# Patient Record
Sex: Male | Born: 1935
Health system: Southern US, Community
[De-identification: ages and names within clinical notes are randomized; demographics above are authoritative.]

## PROBLEM LIST (undated history)

## (undated) DIAGNOSIS — Z860100 Personal history of colon polyps, unspecified: Secondary | ICD-10-CM

## (undated) DIAGNOSIS — D509 Iron deficiency anemia, unspecified: Secondary | ICD-10-CM

## (undated) DIAGNOSIS — R0609 Other forms of dyspnea: Secondary | ICD-10-CM

## (undated) DIAGNOSIS — K219 Gastro-esophageal reflux disease without esophagitis: Secondary | ICD-10-CM

## (undated) DIAGNOSIS — Z8673 Personal history of transient ischemic attack (TIA), and cerebral infarction without residual deficits: Secondary | ICD-10-CM

## (undated) DIAGNOSIS — E119 Type 2 diabetes mellitus without complications: Secondary | ICD-10-CM

## (undated) DIAGNOSIS — R0600 Dyspnea, unspecified: Secondary | ICD-10-CM

## (undated) DIAGNOSIS — K08109 Complete loss of teeth, unspecified cause, unspecified class: Secondary | ICD-10-CM

## (undated) DIAGNOSIS — N401 Enlarged prostate with lower urinary tract symptoms: Secondary | ICD-10-CM

## (undated) DIAGNOSIS — I10 Essential (primary) hypertension: Secondary | ICD-10-CM

## (undated) DIAGNOSIS — M1990 Unspecified osteoarthritis, unspecified site: Secondary | ICD-10-CM

## (undated) DIAGNOSIS — R269 Unspecified abnormalities of gait and mobility: Secondary | ICD-10-CM

## (undated) DIAGNOSIS — E039 Hypothyroidism, unspecified: Secondary | ICD-10-CM

## (undated) DIAGNOSIS — Z972 Presence of dental prosthetic device (complete) (partial): Secondary | ICD-10-CM

## (undated) DIAGNOSIS — E785 Hyperlipidemia, unspecified: Secondary | ICD-10-CM

## (undated) DIAGNOSIS — R011 Cardiac murmur, unspecified: Secondary | ICD-10-CM

## (undated) DIAGNOSIS — R413 Other amnesia: Secondary | ICD-10-CM

## (undated) HISTORY — DX: Personal history of colon polyps, unspecified: Z86.0100

## (undated) HISTORY — DX: Hyperlipidemia, unspecified: E78.5

## (undated) HISTORY — DX: Personal history of colonic polyps: Z86.010

## (undated) HISTORY — DX: Cardiac murmur, unspecified: R01.1

## (undated) HISTORY — DX: Gastro-esophageal reflux disease without esophagitis: K21.9

## (undated) HISTORY — DX: Essential (primary) hypertension: I10

## (undated) HISTORY — PX: LAPAROSCOPIC CHOLECYSTECTOMY: SUR755

## (undated) HISTORY — PX: HERNIA REPAIR: SHX51

---

## 1999-11-21 ENCOUNTER — Other Ambulatory Visit: Admission: RE | Admit: 1999-11-21 | Discharge: 1999-11-21 | Payer: Self-pay | Admitting: Gastroenterology

## 1999-11-21 ENCOUNTER — Encounter (INDEPENDENT_AMBULATORY_CARE_PROVIDER_SITE_OTHER): Payer: Self-pay

## 2000-04-30 ENCOUNTER — Encounter (INDEPENDENT_AMBULATORY_CARE_PROVIDER_SITE_OTHER): Payer: Self-pay | Admitting: Specialist

## 2000-04-30 ENCOUNTER — Other Ambulatory Visit: Admission: RE | Admit: 2000-04-30 | Discharge: 2000-04-30 | Payer: Self-pay | Admitting: Gastroenterology

## 2000-09-02 ENCOUNTER — Encounter: Payer: Self-pay | Admitting: Internal Medicine

## 2000-09-02 ENCOUNTER — Ambulatory Visit (HOSPITAL_COMMUNITY): Admission: RE | Admit: 2000-09-02 | Discharge: 2000-09-02 | Payer: Self-pay | Admitting: Internal Medicine

## 2001-01-23 ENCOUNTER — Encounter: Payer: Self-pay | Admitting: Emergency Medicine

## 2001-01-23 ENCOUNTER — Inpatient Hospital Stay (HOSPITAL_COMMUNITY): Admission: EM | Admit: 2001-01-23 | Discharge: 2001-01-25 | Payer: Self-pay | Admitting: Emergency Medicine

## 2001-09-05 ENCOUNTER — Other Ambulatory Visit: Admission: RE | Admit: 2001-09-05 | Discharge: 2001-09-05 | Payer: Self-pay | Admitting: Gastroenterology

## 2002-01-10 ENCOUNTER — Encounter (INDEPENDENT_AMBULATORY_CARE_PROVIDER_SITE_OTHER): Payer: Self-pay | Admitting: Specialist

## 2002-01-11 ENCOUNTER — Inpatient Hospital Stay (HOSPITAL_COMMUNITY): Admission: EM | Admit: 2002-01-11 | Discharge: 2002-01-16 | Payer: Self-pay | Admitting: Emergency Medicine

## 2002-01-11 ENCOUNTER — Encounter: Payer: Self-pay | Admitting: Emergency Medicine

## 2002-01-14 ENCOUNTER — Encounter: Payer: Self-pay | Admitting: General Surgery

## 2002-11-26 ENCOUNTER — Ambulatory Visit (HOSPITAL_COMMUNITY): Admission: RE | Admit: 2002-11-26 | Discharge: 2002-11-26 | Payer: Self-pay | Admitting: Internal Medicine

## 2004-05-08 ENCOUNTER — Encounter: Admission: RE | Admit: 2004-05-08 | Discharge: 2004-08-06 | Payer: Self-pay | Admitting: Internal Medicine

## 2004-08-17 ENCOUNTER — Encounter: Admission: RE | Admit: 2004-08-17 | Discharge: 2004-08-17 | Payer: Self-pay | Admitting: Internal Medicine

## 2004-11-06 ENCOUNTER — Ambulatory Visit: Payer: Self-pay | Admitting: Internal Medicine

## 2004-11-21 ENCOUNTER — Ambulatory Visit: Payer: Self-pay | Admitting: Internal Medicine

## 2005-01-02 ENCOUNTER — Ambulatory Visit: Payer: Self-pay | Admitting: Internal Medicine

## 2005-01-04 ENCOUNTER — Ambulatory Visit: Payer: Self-pay | Admitting: Internal Medicine

## 2005-04-05 ENCOUNTER — Ambulatory Visit: Payer: Self-pay | Admitting: Internal Medicine

## 2005-04-09 ENCOUNTER — Ambulatory Visit: Payer: Self-pay | Admitting: Gastroenterology

## 2005-04-09 ENCOUNTER — Ambulatory Visit: Payer: Self-pay | Admitting: Internal Medicine

## 2005-05-29 ENCOUNTER — Ambulatory Visit: Payer: Self-pay | Admitting: Gastroenterology

## 2005-06-04 ENCOUNTER — Ambulatory Visit: Payer: Self-pay | Admitting: Internal Medicine

## 2005-06-15 ENCOUNTER — Ambulatory Visit: Payer: Self-pay | Admitting: Internal Medicine

## 2005-06-28 ENCOUNTER — Ambulatory Visit: Payer: Self-pay | Admitting: Internal Medicine

## 2005-08-20 ENCOUNTER — Ambulatory Visit: Payer: Self-pay | Admitting: Internal Medicine

## 2005-10-09 ENCOUNTER — Ambulatory Visit: Payer: Self-pay | Admitting: Internal Medicine

## 2005-10-15 ENCOUNTER — Ambulatory Visit: Payer: Self-pay | Admitting: Internal Medicine

## 2006-01-11 ENCOUNTER — Ambulatory Visit: Payer: Self-pay | Admitting: Internal Medicine

## 2006-05-21 ENCOUNTER — Ambulatory Visit: Payer: Self-pay | Admitting: Internal Medicine

## 2006-05-30 ENCOUNTER — Ambulatory Visit: Payer: Self-pay | Admitting: Family Medicine

## 2006-06-13 ENCOUNTER — Ambulatory Visit: Payer: Self-pay | Admitting: Family Medicine

## 2006-06-20 ENCOUNTER — Ambulatory Visit: Payer: Self-pay | Admitting: Family Medicine

## 2006-08-01 ENCOUNTER — Encounter: Admission: RE | Admit: 2006-08-01 | Discharge: 2006-10-30 | Payer: Self-pay | Admitting: Internal Medicine

## 2006-09-16 ENCOUNTER — Ambulatory Visit: Payer: Self-pay | Admitting: Family Medicine

## 2006-09-30 ENCOUNTER — Ambulatory Visit: Payer: Self-pay | Admitting: Family Medicine

## 2006-10-15 ENCOUNTER — Encounter: Admission: RE | Admit: 2006-10-15 | Discharge: 2007-01-13 | Payer: Self-pay | Admitting: Internal Medicine

## 2006-11-11 ENCOUNTER — Ambulatory Visit: Payer: Self-pay | Admitting: Family Medicine

## 2006-11-25 ENCOUNTER — Ambulatory Visit: Payer: Self-pay | Admitting: Family Medicine

## 2007-01-28 ENCOUNTER — Ambulatory Visit: Payer: Self-pay | Admitting: Family Medicine

## 2007-02-10 DIAGNOSIS — E118 Type 2 diabetes mellitus with unspecified complications: Secondary | ICD-10-CM | POA: Insufficient documentation

## 2007-02-10 DIAGNOSIS — I10 Essential (primary) hypertension: Secondary | ICD-10-CM

## 2007-02-10 DIAGNOSIS — E785 Hyperlipidemia, unspecified: Secondary | ICD-10-CM

## 2007-02-10 HISTORY — DX: Essential (primary) hypertension: I10

## 2007-02-10 HISTORY — DX: Hyperlipidemia, unspecified: E78.5

## 2007-02-10 HISTORY — DX: Type 2 diabetes mellitus with unspecified complications: E11.8

## 2007-04-22 ENCOUNTER — Ambulatory Visit: Payer: Self-pay | Admitting: Family Medicine

## 2007-04-25 ENCOUNTER — Ambulatory Visit: Payer: Self-pay | Admitting: Family Medicine

## 2007-04-25 LAB — CONVERTED CEMR LAB
ALT: 18 units/L (ref 0–40)
AST: 19 units/L (ref 0–37)
BUN: 10 mg/dL (ref 6–23)
Bilirubin, Direct: 0.1 mg/dL (ref 0.0–0.3)
CO2: 31 meq/L (ref 19–32)
Calcium: 8.7 mg/dL (ref 8.4–10.5)
Chloride: 104 meq/L (ref 96–112)
GFR calc Af Amer: 106 mL/min
Hgb A1c MFr Bld: 7.1 % — ABNORMAL HIGH (ref 4.6–6.0)
Potassium: 3.7 meq/L (ref 3.5–5.1)
Total Bilirubin: 1 mg/dL (ref 0.3–1.2)
Total Protein: 6.1 g/dL (ref 6.0–8.3)

## 2007-05-05 ENCOUNTER — Ambulatory Visit: Payer: Self-pay | Admitting: Family Medicine

## 2007-05-15 ENCOUNTER — Ambulatory Visit: Payer: Self-pay | Admitting: Internal Medicine

## 2007-05-22 ENCOUNTER — Ambulatory Visit: Payer: Self-pay | Admitting: Family Medicine

## 2007-05-22 ENCOUNTER — Encounter: Payer: Self-pay | Admitting: Family Medicine

## 2007-05-22 DIAGNOSIS — H6120 Impacted cerumen, unspecified ear: Secondary | ICD-10-CM

## 2007-06-21 ENCOUNTER — Encounter: Payer: Self-pay | Admitting: Family Medicine

## 2007-06-26 ENCOUNTER — Ambulatory Visit: Payer: Self-pay | Admitting: Internal Medicine

## 2007-06-26 DIAGNOSIS — S43499A Other sprain of unspecified shoulder joint, initial encounter: Secondary | ICD-10-CM

## 2007-06-26 DIAGNOSIS — S46819A Strain of other muscles, fascia and tendons at shoulder and upper arm level, unspecified arm, initial encounter: Secondary | ICD-10-CM

## 2007-06-26 HISTORY — DX: Other sprain of unspecified shoulder joint, initial encounter: S43.499A

## 2007-07-28 ENCOUNTER — Ambulatory Visit: Payer: Self-pay | Admitting: Family Medicine

## 2007-07-28 ENCOUNTER — Encounter: Payer: Self-pay | Admitting: Family Medicine

## 2007-07-28 DIAGNOSIS — G5600 Carpal tunnel syndrome, unspecified upper limb: Secondary | ICD-10-CM

## 2007-07-28 HISTORY — DX: Carpal tunnel syndrome, unspecified upper limb: G56.00

## 2007-07-29 ENCOUNTER — Encounter (INDEPENDENT_AMBULATORY_CARE_PROVIDER_SITE_OTHER): Payer: Self-pay | Admitting: *Deleted

## 2007-07-29 LAB — CONVERTED CEMR LAB
Albumin: 3.8 g/dL (ref 3.5–5.2)
BUN: 12 mg/dL (ref 6–23)
Chloride: 101 meq/L (ref 96–112)
GFR calc Af Amer: 106 mL/min
Hgb A1c MFr Bld: 6.4 % — ABNORMAL HIGH (ref 4.6–6.0)
Sodium: 141 meq/L (ref 135–145)

## 2007-08-11 ENCOUNTER — Ambulatory Visit: Payer: Self-pay | Admitting: Family Medicine

## 2007-08-11 DIAGNOSIS — M5412 Radiculopathy, cervical region: Secondary | ICD-10-CM

## 2007-08-11 HISTORY — DX: Radiculopathy, cervical region: M54.12

## 2007-08-21 ENCOUNTER — Encounter: Payer: Self-pay | Admitting: Internal Medicine

## 2007-08-21 DIAGNOSIS — E039 Hypothyroidism, unspecified: Secondary | ICD-10-CM

## 2007-08-21 DIAGNOSIS — I2510 Atherosclerotic heart disease of native coronary artery without angina pectoris: Secondary | ICD-10-CM

## 2007-08-21 DIAGNOSIS — Z860100 Personal history of colon polyps, unspecified: Secondary | ICD-10-CM | POA: Insufficient documentation

## 2007-08-21 DIAGNOSIS — R05 Cough: Secondary | ICD-10-CM

## 2007-08-21 DIAGNOSIS — N529 Male erectile dysfunction, unspecified: Secondary | ICD-10-CM | POA: Insufficient documentation

## 2007-08-21 DIAGNOSIS — K219 Gastro-esophageal reflux disease without esophagitis: Secondary | ICD-10-CM

## 2007-08-21 DIAGNOSIS — E669 Obesity, unspecified: Secondary | ICD-10-CM | POA: Insufficient documentation

## 2007-08-21 DIAGNOSIS — I517 Cardiomegaly: Secondary | ICD-10-CM

## 2007-08-21 HISTORY — DX: Obesity, unspecified: E66.9

## 2007-08-21 HISTORY — DX: Cardiomegaly: I51.7

## 2007-08-21 HISTORY — DX: Male erectile dysfunction, unspecified: N52.9

## 2007-08-21 HISTORY — DX: Gastro-esophageal reflux disease without esophagitis: K21.9

## 2007-08-21 HISTORY — DX: Atherosclerotic heart disease of native coronary artery without angina pectoris: I25.10

## 2007-09-22 ENCOUNTER — Telehealth (INDEPENDENT_AMBULATORY_CARE_PROVIDER_SITE_OTHER): Payer: Self-pay | Admitting: *Deleted

## 2007-09-23 ENCOUNTER — Telehealth (INDEPENDENT_AMBULATORY_CARE_PROVIDER_SITE_OTHER): Payer: Self-pay | Admitting: *Deleted

## 2007-10-07 ENCOUNTER — Encounter: Payer: Self-pay | Admitting: Family Medicine

## 2007-10-09 ENCOUNTER — Telehealth (INDEPENDENT_AMBULATORY_CARE_PROVIDER_SITE_OTHER): Payer: Self-pay | Admitting: *Deleted

## 2007-10-30 ENCOUNTER — Encounter: Payer: Self-pay | Admitting: Family Medicine

## 2007-11-10 ENCOUNTER — Ambulatory Visit: Payer: Self-pay | Admitting: Family Medicine

## 2007-11-10 DIAGNOSIS — M25559 Pain in unspecified hip: Secondary | ICD-10-CM | POA: Insufficient documentation

## 2007-11-10 HISTORY — DX: Pain in unspecified hip: M25.559

## 2007-11-11 ENCOUNTER — Ambulatory Visit: Payer: Self-pay | Admitting: Family Medicine

## 2007-11-11 LAB — CONVERTED CEMR LAB
Alkaline Phosphatase: 38 units/L — ABNORMAL LOW (ref 39–117)
Total Protein: 6.7 g/dL (ref 6.0–8.3)

## 2007-11-13 ENCOUNTER — Telehealth (INDEPENDENT_AMBULATORY_CARE_PROVIDER_SITE_OTHER): Payer: Self-pay | Admitting: *Deleted

## 2007-11-25 ENCOUNTER — Ambulatory Visit: Payer: Self-pay | Admitting: Family Medicine

## 2007-11-25 DIAGNOSIS — Z87898 Personal history of other specified conditions: Secondary | ICD-10-CM

## 2007-11-25 DIAGNOSIS — L299 Pruritus, unspecified: Secondary | ICD-10-CM

## 2007-11-25 DIAGNOSIS — N39498 Other specified urinary incontinence: Secondary | ICD-10-CM | POA: Insufficient documentation

## 2007-11-25 HISTORY — DX: Other specified urinary incontinence: N39.498

## 2007-11-25 HISTORY — DX: Personal history of other specified conditions: Z87.898

## 2007-11-25 HISTORY — DX: Pruritus, unspecified: L29.9

## 2007-11-25 LAB — CONVERTED CEMR LAB
Bilirubin Urine: NEGATIVE
Glucose, Urine, Semiquant: NEGATIVE
Specific Gravity, Urine: 1.025
pH: 6

## 2007-12-01 ENCOUNTER — Encounter (INDEPENDENT_AMBULATORY_CARE_PROVIDER_SITE_OTHER): Payer: Self-pay | Admitting: *Deleted

## 2007-12-01 LAB — CONVERTED CEMR LAB
ALT: 21 units/L (ref 0–53)
AST: 17 units/L (ref 0–37)
Albumin: 3.9 g/dL (ref 3.5–5.2)
Calcium: 9.5 mg/dL (ref 8.4–10.5)
Chloride: 95 meq/L — ABNORMAL LOW (ref 96–112)
GFR calc Af Amer: 106 mL/min
GFR calc non Af Amer: 87 mL/min
PSA: 0.58 ng/mL (ref 0.10–4.00)

## 2007-12-08 ENCOUNTER — Ambulatory Visit: Payer: Self-pay | Admitting: Internal Medicine

## 2007-12-08 DIAGNOSIS — J069 Acute upper respiratory infection, unspecified: Secondary | ICD-10-CM | POA: Insufficient documentation

## 2008-01-05 ENCOUNTER — Ambulatory Visit: Payer: Self-pay | Admitting: Family Medicine

## 2008-01-10 LAB — CONVERTED CEMR LAB: TSH: 6.27 microintl units/mL — ABNORMAL HIGH (ref 0.35–5.50)

## 2008-01-12 ENCOUNTER — Telehealth (INDEPENDENT_AMBULATORY_CARE_PROVIDER_SITE_OTHER): Payer: Self-pay | Admitting: *Deleted

## 2008-01-17 ENCOUNTER — Ambulatory Visit: Payer: Self-pay | Admitting: Family Medicine

## 2008-02-05 ENCOUNTER — Ambulatory Visit: Payer: Self-pay | Admitting: Family Medicine

## 2008-02-09 ENCOUNTER — Encounter (INDEPENDENT_AMBULATORY_CARE_PROVIDER_SITE_OTHER): Payer: Self-pay | Admitting: *Deleted

## 2008-02-10 ENCOUNTER — Encounter: Payer: Self-pay | Admitting: Family Medicine

## 2008-02-24 ENCOUNTER — Encounter: Admission: RE | Admit: 2008-02-24 | Discharge: 2008-02-24 | Payer: Self-pay | Admitting: Internal Medicine

## 2008-02-24 ENCOUNTER — Encounter: Payer: Self-pay | Admitting: Family Medicine

## 2008-04-26 ENCOUNTER — Telehealth (INDEPENDENT_AMBULATORY_CARE_PROVIDER_SITE_OTHER): Payer: Self-pay | Admitting: *Deleted

## 2008-05-04 ENCOUNTER — Ambulatory Visit: Payer: Self-pay | Admitting: Family Medicine

## 2008-05-09 LAB — CONVERTED CEMR LAB
BUN: 10 mg/dL (ref 6–23)
CO2: 33 meq/L — ABNORMAL HIGH (ref 19–32)
GFR calc Af Amer: 106 mL/min
Glucose, Bld: 104 mg/dL — ABNORMAL HIGH (ref 70–99)
Potassium: 4.4 meq/L (ref 3.5–5.1)

## 2008-05-10 ENCOUNTER — Encounter (INDEPENDENT_AMBULATORY_CARE_PROVIDER_SITE_OTHER): Payer: Self-pay | Admitting: *Deleted

## 2008-05-21 ENCOUNTER — Ambulatory Visit: Payer: Self-pay | Admitting: Family Medicine

## 2008-05-24 ENCOUNTER — Telehealth (INDEPENDENT_AMBULATORY_CARE_PROVIDER_SITE_OTHER): Payer: Self-pay | Admitting: *Deleted

## 2008-05-30 LAB — CONVERTED CEMR LAB
Albumin: 3.7 g/dL (ref 3.5–5.2)
Alkaline Phosphatase: 41 units/L (ref 39–117)
Cholesterol: 130 mg/dL (ref 0–200)
LDL Cholesterol: 85 mg/dL (ref 0–99)
Total CHOL/HDL Ratio: 4.6
Total Protein: 6.6 g/dL (ref 6.0–8.3)
Triglycerides: 86 mg/dL (ref 0–149)
VLDL: 17 mg/dL (ref 0–40)

## 2008-05-31 ENCOUNTER — Telehealth (INDEPENDENT_AMBULATORY_CARE_PROVIDER_SITE_OTHER): Payer: Self-pay | Admitting: *Deleted

## 2008-05-31 ENCOUNTER — Encounter (INDEPENDENT_AMBULATORY_CARE_PROVIDER_SITE_OTHER): Payer: Self-pay | Admitting: *Deleted

## 2008-06-04 ENCOUNTER — Telehealth (INDEPENDENT_AMBULATORY_CARE_PROVIDER_SITE_OTHER): Payer: Self-pay | Admitting: *Deleted

## 2008-06-15 ENCOUNTER — Telehealth (INDEPENDENT_AMBULATORY_CARE_PROVIDER_SITE_OTHER): Payer: Self-pay | Admitting: *Deleted

## 2008-06-15 ENCOUNTER — Ambulatory Visit: Payer: Self-pay | Admitting: Internal Medicine

## 2008-06-15 DIAGNOSIS — M542 Cervicalgia: Secondary | ICD-10-CM

## 2008-06-15 HISTORY — DX: Cervicalgia: M54.2

## 2008-06-18 ENCOUNTER — Ambulatory Visit: Payer: Self-pay | Admitting: Family Medicine

## 2008-07-06 ENCOUNTER — Telehealth (INDEPENDENT_AMBULATORY_CARE_PROVIDER_SITE_OTHER): Payer: Self-pay | Admitting: *Deleted

## 2008-07-12 ENCOUNTER — Telehealth (INDEPENDENT_AMBULATORY_CARE_PROVIDER_SITE_OTHER): Payer: Self-pay | Admitting: *Deleted

## 2008-07-13 ENCOUNTER — Ambulatory Visit: Payer: Self-pay | Admitting: Family Medicine

## 2008-07-13 DIAGNOSIS — B3749 Other urogenital candidiasis: Secondary | ICD-10-CM

## 2008-07-13 HISTORY — DX: Other urogenital candidiasis: B37.49

## 2008-07-13 LAB — CONVERTED CEMR LAB
Bilirubin Urine: NEGATIVE
Blood in Urine, dipstick: NEGATIVE
Glucose, Urine, Semiquant: NEGATIVE
Ketones, urine, test strip: NEGATIVE
Nitrite: NEGATIVE
Specific Gravity, Urine: 1.02
pH: 6

## 2008-07-22 ENCOUNTER — Telehealth (INDEPENDENT_AMBULATORY_CARE_PROVIDER_SITE_OTHER): Payer: Self-pay | Admitting: *Deleted

## 2008-07-23 ENCOUNTER — Telehealth (INDEPENDENT_AMBULATORY_CARE_PROVIDER_SITE_OTHER): Payer: Self-pay | Admitting: *Deleted

## 2008-07-26 ENCOUNTER — Ambulatory Visit: Payer: Self-pay | Admitting: Gastroenterology

## 2008-07-27 ENCOUNTER — Telehealth: Payer: Self-pay | Admitting: Gastroenterology

## 2008-07-30 ENCOUNTER — Encounter: Payer: Self-pay | Admitting: Family Medicine

## 2008-08-09 ENCOUNTER — Ambulatory Visit: Payer: Self-pay | Admitting: Gastroenterology

## 2008-08-09 LAB — HM COLONOSCOPY

## 2008-09-20 ENCOUNTER — Ambulatory Visit: Payer: Self-pay | Admitting: Family Medicine

## 2008-09-21 ENCOUNTER — Telehealth (INDEPENDENT_AMBULATORY_CARE_PROVIDER_SITE_OTHER): Payer: Self-pay | Admitting: *Deleted

## 2008-09-23 ENCOUNTER — Telehealth (INDEPENDENT_AMBULATORY_CARE_PROVIDER_SITE_OTHER): Payer: Self-pay | Admitting: *Deleted

## 2008-10-02 LAB — CONVERTED CEMR LAB
ALT: 19 units/L (ref 0–53)
AST: 18 units/L (ref 0–37)
CO2: 33 meq/L — ABNORMAL HIGH (ref 19–32)
Calcium: 9.1 mg/dL (ref 8.4–10.5)
Chloride: 108 meq/L (ref 96–112)
Cholesterol: 144 mg/dL (ref 0–200)
Creatinine, Ser: 0.9 mg/dL (ref 0.4–1.5)
Glucose, Bld: 110 mg/dL — ABNORMAL HIGH (ref 70–99)
HDL: 37.1 mg/dL — ABNORMAL LOW (ref >39.0)
Hgb A1c MFr Bld: 6.4 % — ABNORMAL HIGH (ref 4.6–6.0)
TSH: 3.38 microintl units/mL (ref 0.35–5.50)
Total Bilirubin: 1.1 mg/dL (ref 0.3–1.2)
Total CHOL/HDL Ratio: 3.9
Total Protein: 7 g/dL (ref 6.0–8.3)
Triglycerides: 93 mg/dL (ref 0–149)

## 2008-10-04 ENCOUNTER — Encounter (INDEPENDENT_AMBULATORY_CARE_PROVIDER_SITE_OTHER): Payer: Self-pay | Admitting: *Deleted

## 2008-10-25 ENCOUNTER — Telehealth (INDEPENDENT_AMBULATORY_CARE_PROVIDER_SITE_OTHER): Payer: Self-pay | Admitting: *Deleted

## 2008-11-15 ENCOUNTER — Telehealth (INDEPENDENT_AMBULATORY_CARE_PROVIDER_SITE_OTHER): Payer: Self-pay | Admitting: *Deleted

## 2008-12-04 ENCOUNTER — Telehealth: Payer: Self-pay | Admitting: Family Medicine

## 2008-12-04 ENCOUNTER — Ambulatory Visit: Payer: Self-pay | Admitting: Family Medicine

## 2008-12-30 ENCOUNTER — Ambulatory Visit: Payer: Self-pay | Admitting: Family Medicine

## 2008-12-30 ENCOUNTER — Encounter: Payer: Self-pay | Admitting: Internal Medicine

## 2009-01-13 LAB — CONVERTED CEMR LAB
ALT: 17 units/L (ref 0–53)
AST: 19 units/L (ref 0–37)
Albumin: 3.7 g/dL (ref 3.5–5.2)
BUN: 8 mg/dL (ref 6–23)
CO2: 31 meq/L (ref 19–32)
Calcium: 9.3 mg/dL (ref 8.4–10.5)
Chloride: 100 meq/L (ref 96–112)
Creatinine, Ser: 0.9 mg/dL (ref 0.4–1.5)
GFR calc non Af Amer: 87 mL/min
Hgb A1c MFr Bld: 6.4 % — ABNORMAL HIGH (ref 4.6–6.0)

## 2009-01-14 ENCOUNTER — Encounter (INDEPENDENT_AMBULATORY_CARE_PROVIDER_SITE_OTHER): Payer: Self-pay | Admitting: *Deleted

## 2009-02-14 ENCOUNTER — Telehealth (INDEPENDENT_AMBULATORY_CARE_PROVIDER_SITE_OTHER): Payer: Self-pay | Admitting: *Deleted

## 2009-02-18 ENCOUNTER — Ambulatory Visit: Payer: Self-pay | Admitting: Internal Medicine

## 2009-02-18 DIAGNOSIS — R51 Headache: Secondary | ICD-10-CM

## 2009-02-18 DIAGNOSIS — R519 Headache, unspecified: Secondary | ICD-10-CM | POA: Insufficient documentation

## 2009-02-18 HISTORY — DX: Headache: R51

## 2009-03-03 ENCOUNTER — Ambulatory Visit: Payer: Self-pay | Admitting: Family Medicine

## 2009-03-08 ENCOUNTER — Ambulatory Visit: Payer: Self-pay | Admitting: Family Medicine

## 2009-03-12 ENCOUNTER — Encounter: Payer: Self-pay | Admitting: Family Medicine

## 2009-03-12 ENCOUNTER — Ambulatory Visit: Payer: Self-pay | Admitting: Family Medicine

## 2009-03-12 DIAGNOSIS — J019 Acute sinusitis, unspecified: Secondary | ICD-10-CM

## 2009-03-24 ENCOUNTER — Encounter: Payer: Self-pay | Admitting: Family Medicine

## 2009-03-30 LAB — CONVERTED CEMR LAB
Alkaline Phosphatase: 41 units/L (ref 39–117)
Bilirubin, Direct: 0.1 mg/dL (ref 0.0–0.3)
Calcium: 8.9 mg/dL (ref 8.4–10.5)
Creatinine, Ser: 0.9 mg/dL (ref 0.4–1.5)
HDL: 37.1 mg/dL — ABNORMAL LOW (ref >39.00)
Hgb A1c MFr Bld: 6.4 % (ref 4.6–6.5)
LDL Cholesterol: 124 mg/dL — ABNORMAL HIGH (ref 0–99)
Sodium: 140 meq/L (ref 135–145)
Total Bilirubin: 1 mg/dL (ref 0.3–1.2)
Total CHOL/HDL Ratio: 5
Triglycerides: 100 mg/dL (ref 0.0–149.0)
VLDL: 20 mg/dL (ref 0.0–40.0)

## 2009-03-31 ENCOUNTER — Encounter (INDEPENDENT_AMBULATORY_CARE_PROVIDER_SITE_OTHER): Payer: Self-pay | Admitting: *Deleted

## 2009-04-01 ENCOUNTER — Telehealth (INDEPENDENT_AMBULATORY_CARE_PROVIDER_SITE_OTHER): Payer: Self-pay | Admitting: *Deleted

## 2009-04-04 ENCOUNTER — Telehealth (INDEPENDENT_AMBULATORY_CARE_PROVIDER_SITE_OTHER): Payer: Self-pay | Admitting: *Deleted

## 2009-04-05 ENCOUNTER — Telehealth: Payer: Self-pay | Admitting: Family Medicine

## 2009-04-19 ENCOUNTER — Encounter: Payer: Self-pay | Admitting: Family Medicine

## 2009-04-25 ENCOUNTER — Telehealth (INDEPENDENT_AMBULATORY_CARE_PROVIDER_SITE_OTHER): Payer: Self-pay | Admitting: *Deleted

## 2009-05-27 ENCOUNTER — Emergency Department (HOSPITAL_BASED_OUTPATIENT_CLINIC_OR_DEPARTMENT_OTHER): Admission: EM | Admit: 2009-05-27 | Discharge: 2009-05-27 | Payer: Self-pay | Admitting: Emergency Medicine

## 2009-05-31 ENCOUNTER — Ambulatory Visit: Payer: Self-pay | Admitting: Family Medicine

## 2009-05-31 DIAGNOSIS — S51809A Unspecified open wound of unspecified forearm, initial encounter: Secondary | ICD-10-CM | POA: Insufficient documentation

## 2009-06-02 ENCOUNTER — Telehealth (INDEPENDENT_AMBULATORY_CARE_PROVIDER_SITE_OTHER): Payer: Self-pay | Admitting: *Deleted

## 2009-06-20 ENCOUNTER — Telehealth (INDEPENDENT_AMBULATORY_CARE_PROVIDER_SITE_OTHER): Payer: Self-pay | Admitting: *Deleted

## 2009-07-08 ENCOUNTER — Telehealth (INDEPENDENT_AMBULATORY_CARE_PROVIDER_SITE_OTHER): Payer: Self-pay | Admitting: *Deleted

## 2009-07-11 ENCOUNTER — Ambulatory Visit: Payer: Self-pay | Admitting: Family Medicine

## 2009-07-25 ENCOUNTER — Encounter (INDEPENDENT_AMBULATORY_CARE_PROVIDER_SITE_OTHER): Payer: Self-pay | Admitting: *Deleted

## 2009-07-25 LAB — CONVERTED CEMR LAB
ALT: 13 units/L (ref 0–53)
Albumin: 3.9 g/dL (ref 3.5–5.2)
BUN: 11 mg/dL (ref 6–23)
Bilirubin, Direct: 0.1 mg/dL (ref 0.0–0.3)
CO2: 33 meq/L — ABNORMAL HIGH (ref 19–32)
Chloride: 102 meq/L (ref 96–112)
Creatinine, Ser: 0.8 mg/dL (ref 0.4–1.5)
Glucose, Bld: 106 mg/dL — ABNORMAL HIGH (ref 70–99)
Total CHOL/HDL Ratio: 3
Total Protein: 6.7 g/dL (ref 6.0–8.3)
Triglycerides: 77 mg/dL (ref 0.0–149.0)

## 2009-07-28 ENCOUNTER — Ambulatory Visit: Payer: Self-pay | Admitting: Family Medicine

## 2009-07-28 DIAGNOSIS — M549 Dorsalgia, unspecified: Secondary | ICD-10-CM

## 2009-07-28 HISTORY — DX: Dorsalgia, unspecified: M54.9

## 2009-09-01 ENCOUNTER — Ambulatory Visit: Payer: Self-pay | Admitting: Family Medicine

## 2009-09-05 ENCOUNTER — Telehealth (INDEPENDENT_AMBULATORY_CARE_PROVIDER_SITE_OTHER): Payer: Self-pay | Admitting: *Deleted

## 2009-09-27 ENCOUNTER — Telehealth (INDEPENDENT_AMBULATORY_CARE_PROVIDER_SITE_OTHER): Payer: Self-pay | Admitting: *Deleted

## 2009-09-29 ENCOUNTER — Telehealth (INDEPENDENT_AMBULATORY_CARE_PROVIDER_SITE_OTHER): Payer: Self-pay | Admitting: *Deleted

## 2009-10-06 ENCOUNTER — Telehealth (INDEPENDENT_AMBULATORY_CARE_PROVIDER_SITE_OTHER): Payer: Self-pay | Admitting: *Deleted

## 2009-10-10 ENCOUNTER — Ambulatory Visit: Payer: Self-pay | Admitting: Family Medicine

## 2009-10-13 ENCOUNTER — Telehealth (INDEPENDENT_AMBULATORY_CARE_PROVIDER_SITE_OTHER): Payer: Self-pay | Admitting: *Deleted

## 2009-10-18 ENCOUNTER — Ambulatory Visit: Payer: Self-pay | Admitting: Family Medicine

## 2009-11-01 ENCOUNTER — Telehealth: Payer: Self-pay | Admitting: Family Medicine

## 2009-12-22 ENCOUNTER — Telehealth: Payer: Self-pay | Admitting: Family Medicine

## 2010-01-10 ENCOUNTER — Telehealth (INDEPENDENT_AMBULATORY_CARE_PROVIDER_SITE_OTHER): Payer: Self-pay | Admitting: *Deleted

## 2010-01-12 ENCOUNTER — Emergency Department (HOSPITAL_COMMUNITY): Admission: EM | Admit: 2010-01-12 | Discharge: 2010-01-12 | Payer: Self-pay | Admitting: Emergency Medicine

## 2010-01-17 ENCOUNTER — Ambulatory Visit: Payer: Self-pay | Admitting: Family Medicine

## 2010-01-17 DIAGNOSIS — R079 Chest pain, unspecified: Secondary | ICD-10-CM

## 2010-01-30 ENCOUNTER — Ambulatory Visit: Payer: Self-pay | Admitting: Internal Medicine

## 2010-02-20 ENCOUNTER — Ambulatory Visit: Payer: Self-pay | Admitting: Diagnostic Radiology

## 2010-02-20 ENCOUNTER — Emergency Department (HOSPITAL_BASED_OUTPATIENT_CLINIC_OR_DEPARTMENT_OTHER): Admission: EM | Admit: 2010-02-20 | Discharge: 2010-02-20 | Payer: Self-pay | Admitting: Emergency Medicine

## 2010-03-06 ENCOUNTER — Telehealth (INDEPENDENT_AMBULATORY_CARE_PROVIDER_SITE_OTHER): Payer: Self-pay | Admitting: *Deleted

## 2010-03-14 ENCOUNTER — Ambulatory Visit: Payer: Self-pay | Admitting: Family Medicine

## 2010-03-20 LAB — CONVERTED CEMR LAB
ALT: 13 units/L (ref 0–53)
AST: 16 units/L (ref 0–37)
Albumin: 3.9 g/dL (ref 3.5–5.2)
Alkaline Phosphatase: 39 units/L (ref 39–117)
BUN: 8 mg/dL (ref 6–23)
Bilirubin, Direct: 0.2 mg/dL (ref 0.0–0.3)
CO2: 32 meq/L (ref 19–32)
Calcium: 8.8 mg/dL (ref 8.4–10.5)
Chloride: 105 meq/L (ref 96–112)
Cholesterol: 114 mg/dL (ref 0–200)
Creatinine, Ser: 0.9 mg/dL (ref 0.4–1.5)
GFR calc non Af Amer: 86.85 mL/min (ref >60)
Glucose, Bld: 100 mg/dL — ABNORMAL HIGH (ref 70–99)
HDL: 40.9 mg/dL (ref >39.00)
Hgb A1c MFr Bld: 6 % (ref 4.6–6.5)
LDL Cholesterol: 60 mg/dL (ref 0–99)
Potassium: 4.4 meq/L (ref 3.5–5.1)
Sodium: 140 meq/L (ref 135–145)
Total Bilirubin: 1 mg/dL (ref 0.3–1.2)
Total CHOL/HDL Ratio: 3
Total Protein: 6.8 g/dL (ref 6.0–8.3)
Triglycerides: 65 mg/dL (ref 0.0–149.0)
VLDL: 13 mg/dL (ref 0.0–40.0)

## 2010-04-05 ENCOUNTER — Telehealth (INDEPENDENT_AMBULATORY_CARE_PROVIDER_SITE_OTHER): Payer: Self-pay | Admitting: *Deleted

## 2010-04-28 ENCOUNTER — Telehealth (INDEPENDENT_AMBULATORY_CARE_PROVIDER_SITE_OTHER): Payer: Self-pay | Admitting: *Deleted

## 2010-05-09 ENCOUNTER — Telehealth (INDEPENDENT_AMBULATORY_CARE_PROVIDER_SITE_OTHER): Payer: Self-pay | Admitting: *Deleted

## 2010-05-18 ENCOUNTER — Telehealth: Payer: Self-pay | Admitting: Family Medicine

## 2010-05-18 ENCOUNTER — Ambulatory Visit: Payer: Self-pay | Admitting: Diagnostic Radiology

## 2010-05-18 ENCOUNTER — Emergency Department (HOSPITAL_BASED_OUTPATIENT_CLINIC_OR_DEPARTMENT_OTHER): Admission: EM | Admit: 2010-05-18 | Discharge: 2010-05-18 | Payer: Self-pay | Admitting: Emergency Medicine

## 2010-05-18 IMAGING — CR DG ABDOMEN ACUTE W/ 1V CHEST
4 series · 4 of 4 positions shown · non-contrast
Comparison: None.

CLINICAL DATA: Abdominal and back pain.

ACUTE ABDOMEN SERIES (ABDOMEN 2 VIEW & CHEST 1 VIEW)

[w chest pa]
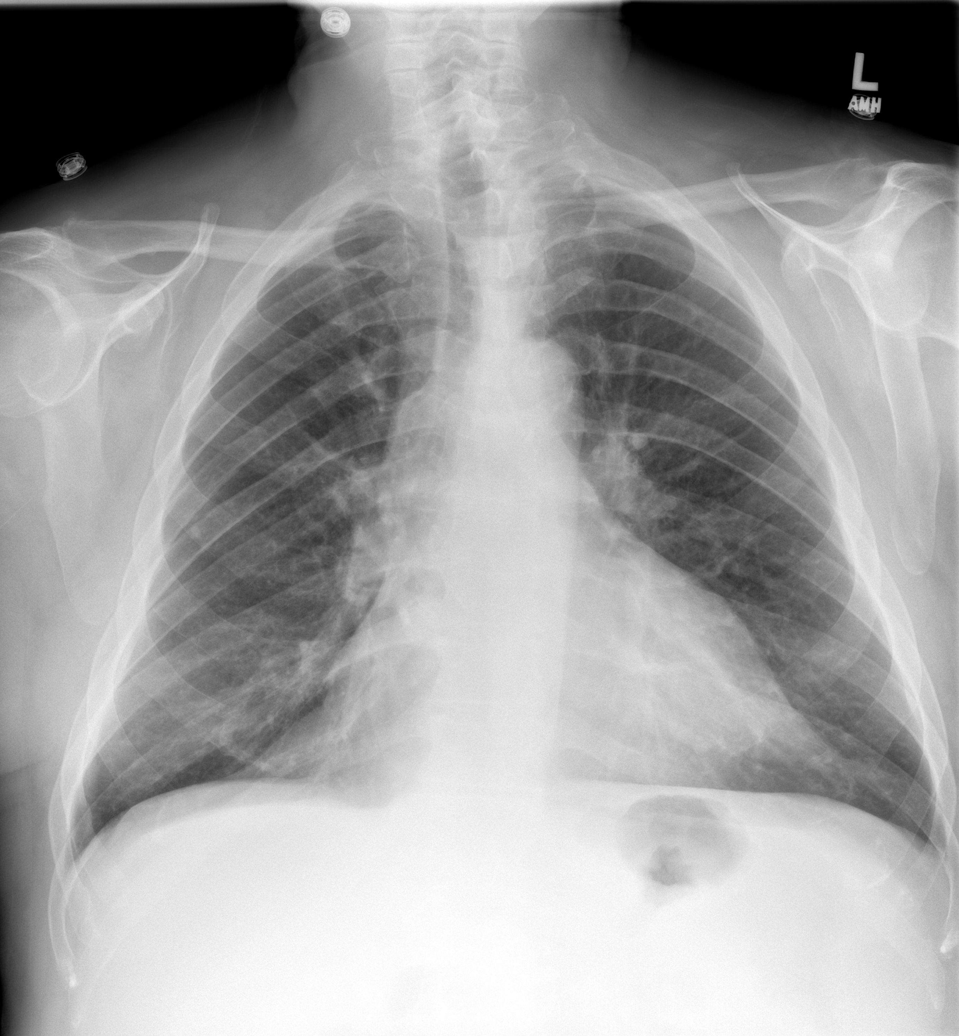

[w abdomen upright]
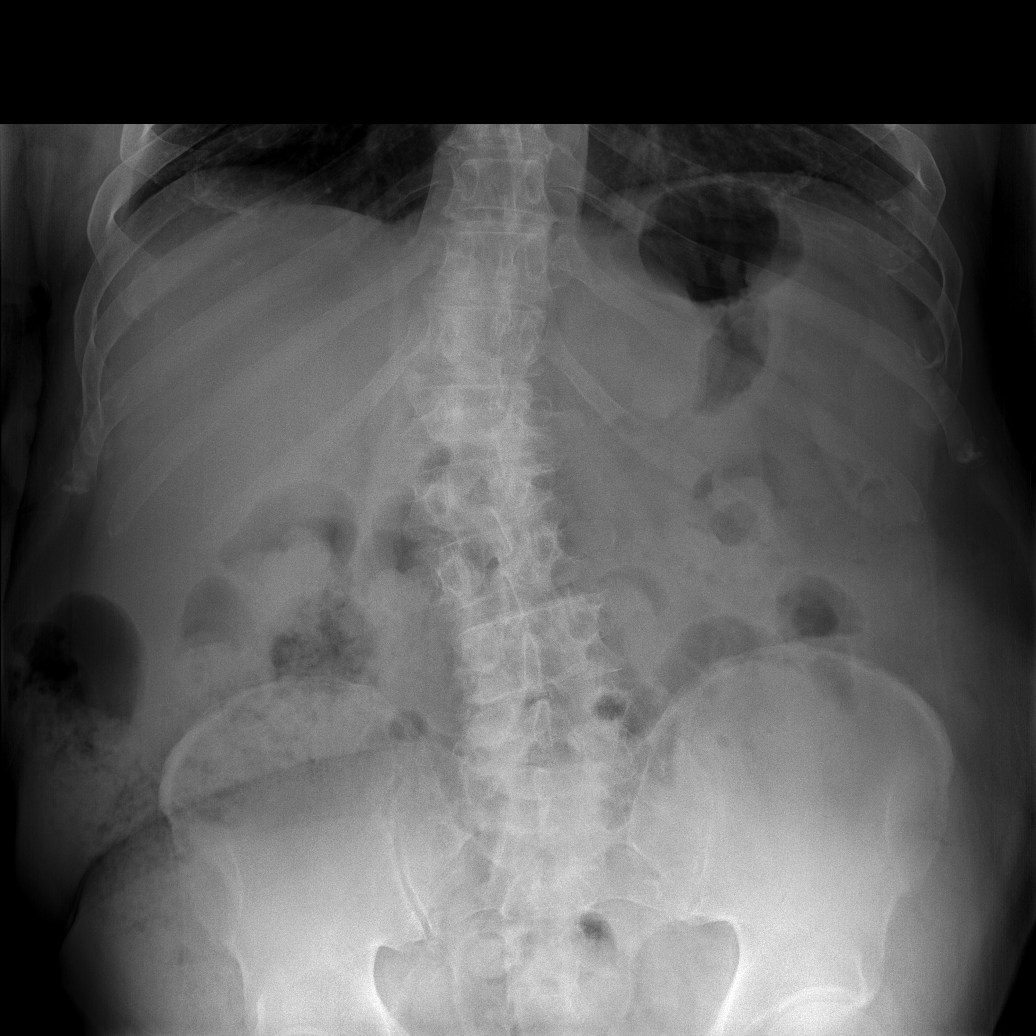

[t abdomen supine (1 of 2)]
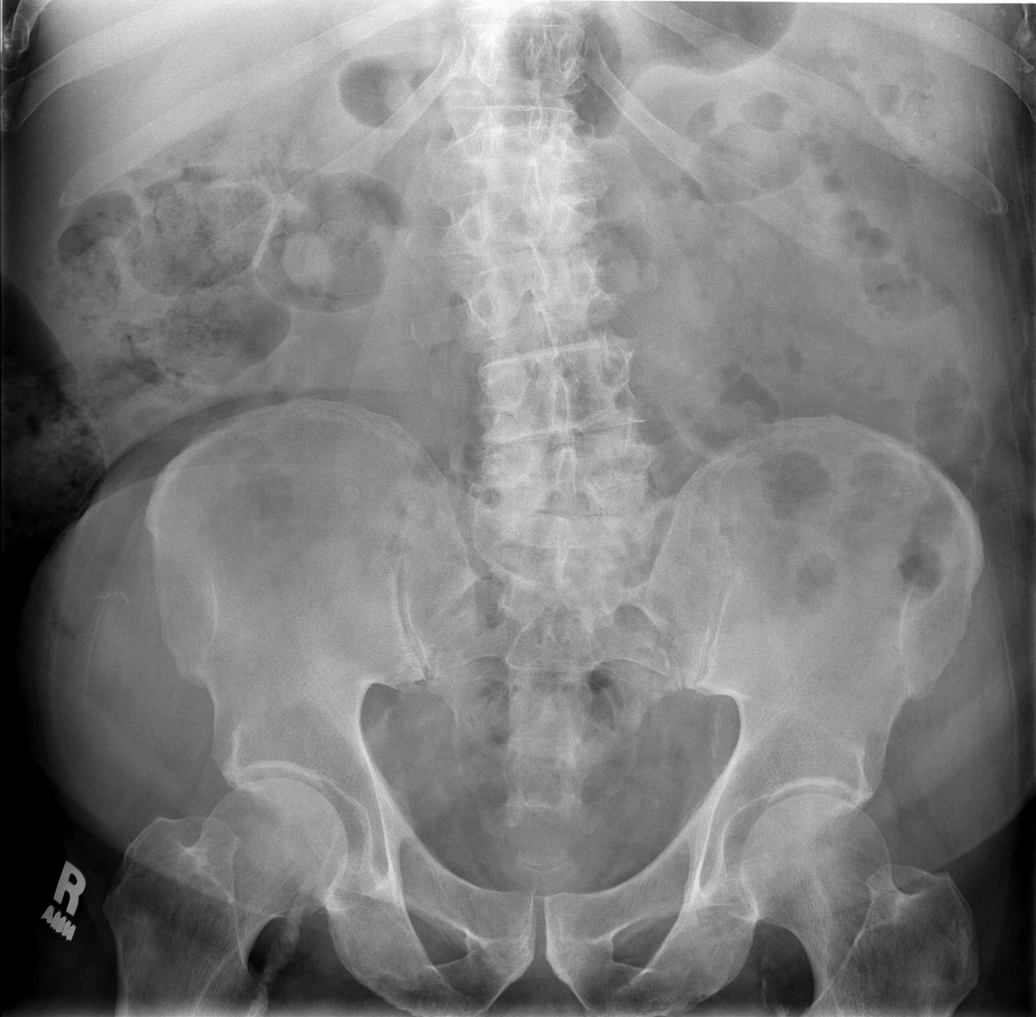

[t abdomen supine (2 of 2)]
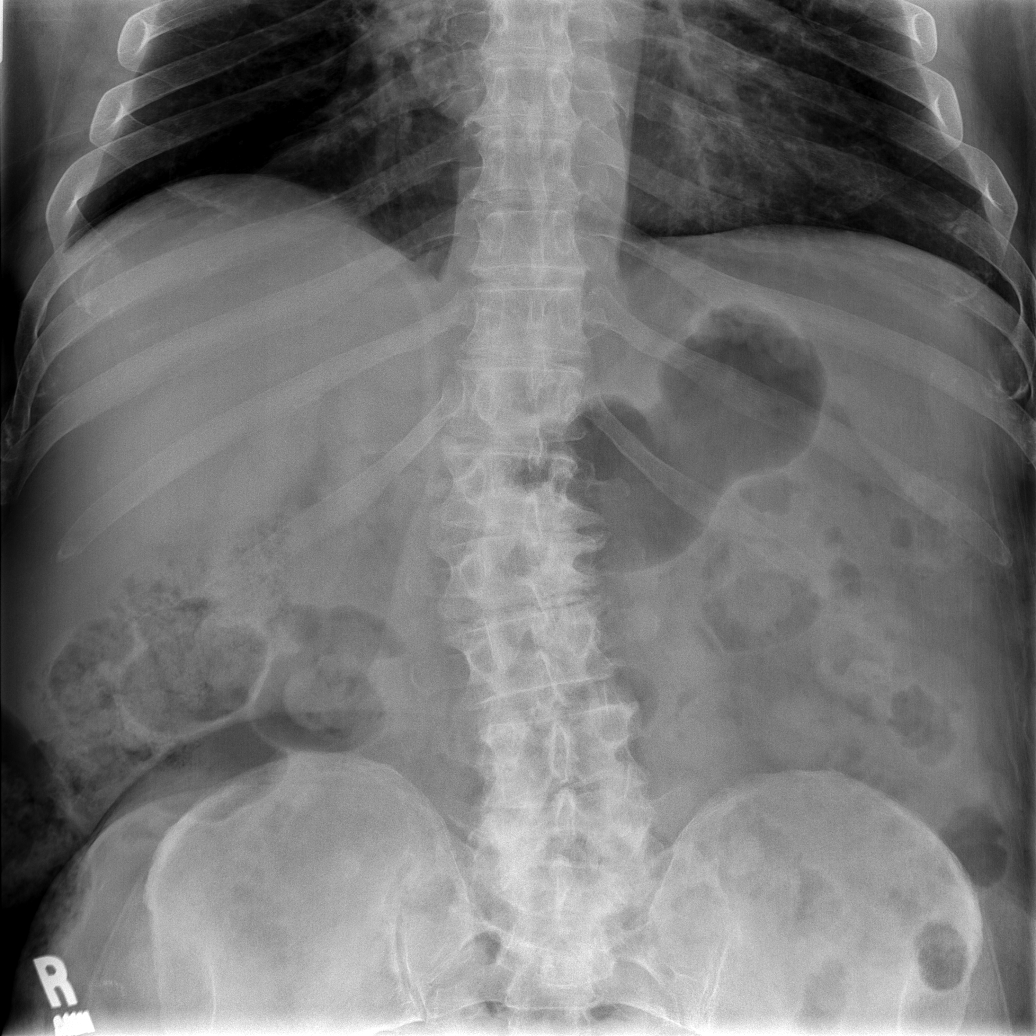

[4 of 4 positions shown; findings below may reference images not displayed]

FINDINGS: Single view of the chest demonstrates a calcified
granuloma in the right mid lung.  The lungs are otherwise clear.
Mild cardiomegaly.

Two views of the abdomen show no free intraperitoneal air.  Bowel
gas pattern is normal.  Convex right scoliosis and lumbar
spondylosis noted.
IMPRESSION: No acute finding chest or abdomen.

## 2010-06-05 ENCOUNTER — Telehealth (INDEPENDENT_AMBULATORY_CARE_PROVIDER_SITE_OTHER): Payer: Self-pay | Admitting: *Deleted

## 2010-06-27 ENCOUNTER — Ambulatory Visit: Payer: Self-pay | Admitting: Family Medicine

## 2010-06-27 DIAGNOSIS — S61409A Unspecified open wound of unspecified hand, initial encounter: Secondary | ICD-10-CM | POA: Insufficient documentation

## 2010-07-06 ENCOUNTER — Telehealth: Payer: Self-pay | Admitting: Family Medicine

## 2010-07-10 ENCOUNTER — Telehealth (INDEPENDENT_AMBULATORY_CARE_PROVIDER_SITE_OTHER): Payer: Self-pay | Admitting: *Deleted

## 2010-08-18 ENCOUNTER — Encounter: Payer: Self-pay | Admitting: Family Medicine

## 2010-08-18 ENCOUNTER — Telehealth: Payer: Self-pay | Admitting: Family Medicine

## 2010-08-21 ENCOUNTER — Ambulatory Visit: Payer: Self-pay | Admitting: Family Medicine

## 2010-08-29 ENCOUNTER — Telehealth: Payer: Self-pay | Admitting: Family Medicine

## 2010-09-14 ENCOUNTER — Telehealth: Payer: Self-pay | Admitting: Family Medicine

## 2010-10-06 ENCOUNTER — Ambulatory Visit: Payer: Self-pay | Admitting: Family Medicine

## 2010-10-12 ENCOUNTER — Telehealth (INDEPENDENT_AMBULATORY_CARE_PROVIDER_SITE_OTHER): Payer: Self-pay | Admitting: *Deleted

## 2010-10-13 LAB — CONVERTED CEMR LAB
AST: 15 units/L (ref 0–37)
Albumin: 3.9 g/dL (ref 3.5–5.2)
Alkaline Phosphatase: 44 units/L (ref 39–117)
Cholesterol: 205 mg/dL — ABNORMAL HIGH (ref 0–200)
Total Protein: 6.5 g/dL (ref 6.0–8.3)

## 2010-10-17 ENCOUNTER — Telehealth (INDEPENDENT_AMBULATORY_CARE_PROVIDER_SITE_OTHER): Payer: Self-pay | Admitting: *Deleted

## 2010-10-19 ENCOUNTER — Telehealth (INDEPENDENT_AMBULATORY_CARE_PROVIDER_SITE_OTHER): Payer: Self-pay | Admitting: *Deleted

## 2010-11-07 ENCOUNTER — Ambulatory Visit: Payer: Self-pay | Admitting: Family Medicine

## 2010-11-13 ENCOUNTER — Telehealth: Payer: Self-pay | Admitting: Family Medicine

## 2010-11-21 ENCOUNTER — Telehealth: Payer: Self-pay | Admitting: Family Medicine

## 2010-12-29 ENCOUNTER — Encounter: Payer: Self-pay | Admitting: Family Medicine

## 2010-12-29 ENCOUNTER — Ambulatory Visit
Admission: RE | Admit: 2010-12-29 | Discharge: 2010-12-29 | Payer: Self-pay | Source: Home / Self Care | Attending: Family Medicine | Admitting: Family Medicine

## 2011-01-14 ENCOUNTER — Encounter: Payer: Self-pay | Admitting: Family Medicine

## 2011-01-16 ENCOUNTER — Ambulatory Visit
Admission: RE | Admit: 2011-01-16 | Discharge: 2011-01-16 | Payer: Self-pay | Source: Home / Self Care | Attending: Family Medicine | Admitting: Family Medicine

## 2011-01-21 LAB — CONVERTED CEMR LAB
AST: 20 units/L (ref 0–37)
Albumin: 3.7 g/dL (ref 3.5–5.2)
Alkaline Phosphatase: 41 units/L (ref 39–117)
BUN: 11 mg/dL (ref 6–23)
Calcium: 8.8 mg/dL (ref 8.4–10.5)
Calcium: 8.8 mg/dL (ref 8.4–10.5)
Chloride: 99 meq/L (ref 96–112)
Chloride: 99 meq/L (ref 96–112)
GFR calc non Af Amer: 87 mL/min
GFR calc non Af Amer: 87 mL/min
Glucose, Bld: 118 mg/dL — ABNORMAL HIGH (ref 70–99)
Hgb A1c MFr Bld: 6.5 % — ABNORMAL HIGH (ref 4.6–6.0)
Hgb A1c MFr Bld: 6.5 % — ABNORMAL HIGH (ref 4.6–6.0)
TSH: 7.24 microintl units/mL — ABNORMAL HIGH (ref 0.35–5.50)
Total Bilirubin: 1 mg/dL (ref 0.3–1.2)

## 2011-01-23 ENCOUNTER — Telehealth (INDEPENDENT_AMBULATORY_CARE_PROVIDER_SITE_OTHER): Payer: Self-pay | Admitting: *Deleted

## 2011-01-23 NOTE — Assessment & Plan Note (Signed)
Summary: cut on hand/cbs   Vital Signs:  Patient profile:   77 year old male Height:      66 inches (167.64 cm) Weight:      207.25 pounds (94.20 kg) BMI:     33.57 O2 Sat:      98 % on Room air Temp:     98.3 degrees F (36.83 degrees C) oral Pulse rate:   69 / minute BP sitting:   146 / 70  (left arm) Cuff size:   large  Vitals Entered By: Lucious Groves (June 27, 2010 3:15 PM)  O2 Flow:  Room air CC: C/O cut on hand--Per pt he hit his hand under the dash of the car trying to get bags of ice out./kb, Cough Is Patient Diabetic? Yes Pain Assessment Patient in pain? no      Comments Patient notes that he hears his own heart beat when in confined areas. Patient would also like to know if he needs TD immunization./kb   History of Present Illness: Pt cut his R hand lifting a bag of ice out of car yesterday.  Pt cleaned it up and no other problems but he wanted his tetanus checked.   Cough      This is a 77 year old man who presents with Cough.  The symptoms began 1 week ago.  The patient reports productive cough, but denies non-productive cough, pleuritic chest pain, shortness of breath, wheezing, exertional dyspnea, fever, hemoptysis, and malaise.  Associated symtpoms include cold/URI symptoms and nasal congestion.  The patient denies the following symptoms: sore throat, chronic rhinitis, weight loss, acid reflux symptoms, and peripheral edema.  The cough is worse with activity and lying down.  Ineffective prior treatments have included OTC cough medication.    Current Medications (verified): 1)  Nexium 40 Mg Cpdr (Esomeprazole Magnesium) .... 40 Mg By Mouth Two Times A Day 2)  Uroxatral 10 Mg Tb24 (Alfuzosin Hcl) .... Take 1 Tablet By Mouth Once A Day 3)  Micardis 80 Mg Tabs (Telmisartan) .Marland Kitchen.. 1 Tablet By Mouth Once A Day 4)  Metformin Hcl 1000 Mg  Tabs (Metformin Hcl) .Marland Kitchen.. 1 By Mouth Two Times A Day 5)  Pravachol 80 Mg  Tabs (Pravastatin Sodium) .... Take One Tablet Daily 6)   Synthroid 75 Mcg  Tabs (Levothyroxine Sodium) .Marland Kitchen.. 1 By Mouth Once Daily 7)  Darvon-N 100 Mg  Tabs (Propoxyphene Napsylate) .Marland Kitchen.. 1 - 2 By Mouth Q 6 Hrs As Needed Pain 8)  Finasteride 5 Mg Tabs (Finasteride) .... One By Mouth Daily 9)  Gabapentin 100 Mg Caps (Gabapentin) .Marland Kitchen.. 1 Q 8 Hrs As Needed For Migratory Headache 10)  Ultram 50 Mg Tabs (Tramadol Hcl) .Marland Kitchen.. 1 By Mouth Every 6 Hrs As Needed. 11)  Augmentin 875-125 Mg Tabs (Amoxicillin-Pot Clavulanate) .Marland Kitchen.. 1 By Mouth Two Times A Day  Allergies (verified): 1)  ! Prednisone (Prednisone) 2)  ! Verapamil Hcl Cr (Verapamil Hcl) 3)  ! Clonidine Hcl (Clonidine Hcl) 4)  ! Atenolol (Atenolol) 5)  * Prednisone  Past History:  Past medical, surgical, family and social histories (including risk factors) reviewed for relevance to current acute and chronic problems.  Past Medical History: Reviewed history from 01/30/2010 and no changes required. Diabetes mellitus, type II Hyperlipidemia Hypertension Colonic polyps, hx of GERD Hypothyroidism Chest pain.  Past Surgical History: Reviewed history from 08/21/2007 and no changes required. Appendectomy  2003  Family History: Reviewed history from 01/30/2010 and no changes required. Unknown  Social History: Reviewed  history from 06/15/2008 and no changes required. 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  Otoscopic examination reveals clear canals, tympanic membranes are intact bilaterally without bulging, retraction, inflammation or discharge. Hearing is grossly normal bilaterally. Nose:  External nasal examination shows no deformity or inflammation. Nasal mucosa are pink and moist without lesions or exudates. Mouth:   Oral mucosa and oropharynx without lesions or exudates.  Teeth in good repair. Neck:  No deformities, masses, or tenderness noted. Lungs:  R decreased breath sounds and L decreased breath sounds.   Heart:  normal rate.   Skin:  skin tear R hand --1 in long no signs of infection Psych:  Cognition and judgment appear intact. Alert and cooperative with normal attention span and concentration. No apparent delusions, illusions, hallucinations   Impression & Recommendations:  Problem # 1:  LACERATION, HAND, RIGHT (ICD-882.0) tetanus given abx oint bandaid  rto as needed   Problem # 2:  BRONCHITIS- ACUTE (ICD-466.0)  His updated medication list for this problem includes:    Augmentin 875-125 Mg Tabs (Amoxicillin-pot clavulanate) .Marland Kitchen... 1 by mouth two times a day  Take antibiotics and other medications as directed. Encouraged to push clear liquids, get enough rest, and take acetaminophen as needed. To be seen in 5-7 days if no improvement, sooner if worse.  Complete Medication List: 1)  Nexium 40 Mg Cpdr (Esomeprazole magnesium) .... 40 mg by mouth two times a day 2)  Uroxatral 10 Mg Tb24 (Alfuzosin hcl) .... Take 1 tablet by mouth once a day 3)  Micardis 80 Mg Tabs (Telmisartan) .Marland Kitchen.. 1 tablet by mouth once a day 4)  Metformin Hcl 1000 Mg Tabs (Metformin hcl) .Marland Kitchen.. 1 by mouth two times a day 5)  Pravachol 80 Mg Tabs (Pravastatin sodium) .... Take one tablet daily 6)  Synthroid 75 Mcg Tabs (Levothyroxine sodium) .Marland Kitchen.. 1 by mouth once daily 7)  Darvon-n 100 Mg Tabs (Propoxyphene napsylate) .Marland Kitchen.. 1 - 2 by mouth q 6 hrs as needed pain 8)  Finasteride 5 Mg Tabs (Finasteride) .... One by mouth daily 9)  Gabapentin 100 Mg Caps (Gabapentin) .Marland Kitchen.. 1 q 8 hrs as needed for migratory headache 10)  Ultram 50 Mg Tabs (Tramadol hcl) .Marland Kitchen.. 1 by mouth every 6 hrs as needed. 11)  Augmentin 875-125 Mg Tabs (Amoxicillin-pot clavulanate) .Marland Kitchen.. 1 by mouth two times a day  Other Orders: Tdap => 17yrs IM  (11914) Admin 1st Vaccine (78295) Prescriptions: AUGMENTIN 875-125 MG TABS (AMOXICILLIN-POT CLAVULANATE) 1 by mouth two times a day  #20 x 0   Entered and Authorized by:   Loreen Freud DO   Signed by:   Loreen Freud DO on 06/27/2010   Method used:   Electronically to        [facility] [address] (retail)       [phone]       [place], [place]  [address]       Ph: [phone]       Fax: [phone]   RxID:   [id number]    Immunizations Administered:  Tetanus Vaccine:    Vaccine Type: Tdap    Site: right deltoid    Mfr: GlaxoSmithKline    Dose: 0.5 ml  Route: IM    Given by: Lucious Groves    Exp. Date: 03/17/2012    Lot #: [id number]    VIS given: 11/11/07 version given June 27, 2010.   Immunizations Administered:  Tetanus Vaccine:    Vaccine Type: Tdap    Site: right deltoid    Mfr: GlaxoSmithKline    Dose: 0.5 ml    Route: IM    Given by: Lucious Groves    Exp. Date: 03/17/2012    Lot #: [id number]    VIS given: 11/11/07 version given June 27, 2010.

## 2011-01-23 NOTE — Assessment & Plan Note (Signed)
Summary: itching all over back/kn   Vital Signs:  Patient profile:   77 year old male Weight:      199.0 pounds Temp:     98.4 degrees F oral BP sitting:   108 / 62  (right arm) Cuff size:   large  Vitals Entered By: Almeta Monas CMA Duncan Dull) (November 07, 2010 3:32 PM) CC: c/o itching all over-- thinks its being caused by the Zocor   History of Present Illness: Pt c/o itching all over and he feels it is the zocor.   No rash.  No other complaints.  No new meds.  Current Medications (verified): 1)  Nexium 40 Mg Cpdr (Esomeprazole Magnesium) .... 40 Mg By Mouth Two Times A Day 2)  Uroxatral 10 Mg Tb24 (Alfuzosin Hcl) .... Take 1 Tablet By Mouth Once A Day 3)  Micardis 80 Mg Tabs (Telmisartan) .Marland Kitchen.. 1 Tablet By Mouth Once A Day 4)  Metformin Hcl 1000 Mg  Tabs (Metformin Hcl) .Marland Kitchen.. 1 By Mouth Two Times A Day 5)  Synthroid 75 Mcg  Tabs (Levothyroxine Sodium) .Marland Kitchen.. 1 By Mouth Once Daily 6)  Finasteride 5 Mg Tabs (Finasteride) .... One By Mouth Daily 7)  Zocor 40 Mg Tabs (Simvastatin) .... Take 1 Tab At Bedtime 8)  Hydroxyzine Hcl 50 Mg Tabs (Hydroxyzine Hcl) .Marland Kitchen.. 1 By Mouth Q6h As Needed Itching  Allergies (verified): 1)  ! Prednisone (Prednisone) 2)  ! Verapamil Hcl Cr (Verapamil Hcl) 3)  ! Clonidine Hcl (Clonidine Hcl) 4)  ! Atenolol (Atenolol) 5)  * Prednisone  Past History:  Past medical, surgical, family and social histories (including risk factors) reviewed for relevance to current acute and chronic problems.  Past Medical History: Reviewed history from 01/30/2010 and no changes required. Diabetes mellitus, type II Hyperlipidemia Hypertension Colonic polyps, hx of GERD Hypothyroidism Chest pain.  Past Surgical History: Reviewed history from 08/21/2007 and no changes required. Appendectomy  2003  Family History: Reviewed history from 01/30/2010 and no changes required. Unknown  Social History: Reviewed history from 06/15/2008 and no changes required. 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     Impression & Recommendations:  Problem # 1:  PRURITUS (ICD-698.9)  ? from zocor d/c for now---call in 2 weeks --if itching stops we will need to discuss other options for cholesterol  Orders: Prescription Created Electronically [phone])  Complete Medication List: 1)  Nexium 40 Mg Cpdr (Esomeprazole magnesium) .... 40 mg by mouth two times a day 2)  Uroxatral 10 Mg Tb24 (Alfuzosin hcl) .... Take 1 tablet by mouth once a day 3)  Micardis 80 Mg Tabs (Telmisartan) .Marland Kitchen.. 1 tablet by mouth once a day 4)  Metformin Hcl 1000 Mg Tabs (Metformin hcl) .Marland Kitchen.. 1 by mouth two times a day 5)  Synthroid 75 Mcg Tabs (Levothyroxine sodium) .Marland Kitchen.. 1 by mouth once daily 6)  Finasteride 5 Mg Tabs (Finasteride) .... One by mouth daily 7)  Zocor 40 Mg Tabs (Simvastatin) .... Take 1 tab at bedtime 8)  Hydroxyzine Hcl 50 Mg Tabs (Hydroxyzine hcl) .Marland Kitchen.. 1 by mouth q6h as needed itching  Other Orders: Venipuncture (69629) TLB-TSH (Thyroid Stimulating Hormone) (84443-TSH) Specimen Handling (52841) TLB-TSH (Thyroid Stimulating Hormone) (32440-NUU)  Patient Instructions: 1)  stop zocor---call office  in 2 weeks to let us know if itching has stopped Prescriptions: FINASTERIDE 5 MG TABS (FINASTERIDE) one by mouth daily  #90 x 3   Entered and Authorized by:   Loreen Freud DO   Signed by:   Loreen Freud DO on 11/07/2010   Method used:   Print then Give to Patient   RxID:   [id number] SYNTHROID 75 MCG  TABS (LEVOTHYROXINE SODIUM) 1 by mouth once daily  #90 x 3   Entered and Authorized by:   Loreen Freud DO   Signed by:   Loreen Freud DO on 11/07/2010   Method used:   Print  then Give to Patient   RxID:   [id number] METFORMIN HCL 1000 MG  TABS (METFORMIN HCL) 1 by mouth two times a day  #180 x 3   Entered and Authorized by:   Loreen Freud DO   Signed by:   Loreen Freud DO on 11/07/2010   Method used:   Print then Give to Patient   RxID:   [id number] MICARDIS 80 MG TABS (TELMISARTAN) 1 tablet by mouth once a day  #90 Each x 3   Entered and Authorized by:   Loreen Freud DO   Signed by:   Loreen Freud DO on 11/07/2010   Method used:   Print then Give to Patient   RxID:   [id number] UROXATRAL 10 MG TB24 (ALFUZOSIN HCL) Take 1 tablet by mouth once a day  #90 x 3   Entered and Authorized by:   Loreen Freud DO   Signed by:   Loreen Freud DO on 11/07/2010   Method used:   Print then Give to Patient   RxID:   [id number] NEXIUM 40 MG CPDR (ESOMEPRAZOLE MAGNESIUM) 40 mg by mouth two times a day  #180 x 3   Entered and Authorized by:   Loreen Freud DO   Signed by:   Loreen Freud DO on 11/07/2010   Method used:   Print then Give to Patient   RxID:   [id number] HYDROXYZINE HCL 50 MG TABS (HYDROXYZINE HCL) 1 by mouth q6h as needed itching  #60 x 1   Entered and Authorized by:   Loreen Freud DO   Signed by:   Loreen Freud DO on 11/07/2010   Method used:   Electronically to        [facility] [address] (retail)       [phone]       [place], [place]  [address]       Ph: [phone]       Fax: [phone]   RxID:   [id number]    Orders Added: 1)  Venipuncture [36415] 2)  TLB-TSH (Thyroid Stimulating Hormone) [84443-TSH] 3)  Specimen Handling [99000] 4)  TLB-TSH (Thyroid Stimulating Hormone) [84443-TSH] 5)  Est. Patient Level III [54627] 6)  Prescription Created Electronically [phone]

## 2011-01-23 NOTE — Assessment & Plan Note (Signed)
Summary: np6/ chest pain  Medications Added METFORMIN HCL 1000 MG  TABS (METFORMIN HCL) 1 by mouth two times a day      Allergies Added:   Visit Type:  Follow-up Primary Provider:  Laury Axon  CC:  pt had eposide of chest pain-pt went to fire station from there he went to ER. Pt states that he had acid refux and is now on nexium.Marland Kitchen  History of Present Illness: Patient is a 77 year old with a history of chest pain, NIDDM, hypertenison, hypothroidiism, GERD. and dyslipidemia.  He is referred for evaluation of chest pain. the patent says that this winter he was unloading the car of packages when he developed an episode of chest pressure.  The pain was not pleuritic or position.  It continued and he went to the [facility] ER.  there is no dictation from this interactions.  Labs were unremarkable.  He was discharged with Dx of possible GERd and told to take Nexium (he had been on in past but stopped). He notes that since that spell he has not had any further pains.  breathing is ok.  He remains fairly active.  Current Medications (verified): 1)  Nexium 40 Mg Cpdr (Esomeprazole Magnesium) .... 40 Mg By Mouth Once A Day 2)  Uroxatral 10 Mg Tb24 (Alfuzosin Hcl) .... Take 1 Tablet By Mouth Once A Day 3)  Micardis 80 Mg Tabs (Telmisartan) .Marland Kitchen.. 1 Tablet By Mouth Once A Day 4)  Metformin Hcl 1000 Mg  Tabs (Metformin Hcl) .Marland Kitchen.. 1 By Mouth Two Times A Day 5)  Pravachol 80 Mg  Tabs (Pravastatin Sodium) .... Take One Tablet Daily 6)  Synthroid 75 Mcg  Tabs (Levothyroxine Sodium) .Marland Kitchen.. 1 By Mouth Once Daily 7)  Darvon-N 100 Mg  Tabs (Propoxyphene Napsylate) .Marland Kitchen.. 1 - 2 By Mouth Q 6 Hrs As Needed Pain 8)  Finasteride 5 Mg Tabs (Finasteride) .... One By Mouth Daily 9)  Gabapentin 100 Mg Caps (Gabapentin) .Marland Kitchen.. 1 Q 8 Hrs As Needed For Migratory Headache 10)  Ultram 50 Mg Tabs (Tramadol Hcl) .Marland Kitchen.. 1 By Mouth Every 6 Hrs As Needed.  Allergies (verified): 1)  ! Prednisone (Prednisone) 2)  ! Verapamil Hcl Cr  (Verapamil Hcl) 3)  ! Clonidine Hcl (Clonidine Hcl) 4)  ! 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  Family History: Unknown  Review of Systems       All systems reviewed.  Negative to the above problem except as noted above.  Vital Signs:  Patient profile:   77 year old male Height:      66 inches Weight:      205 pounds Pulse rate:   71 / minute BP sitting:   110 / 61  (left arm) Cuff size:   large  Vitals Entered By: Burnett Kanaris, CNA (January 30, 2010 2:56 PM)  Physical Exam  Additional Exam:  HEENT:  Normocephalic, atraumatic. EOMI, PERRLA.  Neck: JVP is normal. No thyromegaly. No bruits.  Lungs: clear to auscultation. No rales no wheezes.  Heart: Regular rate and rhythm. Normal S1, S2. No S3.   No significant murmurs. PMI not displaced.  Abdomen:  Supple, nontender. Normal bowel sounds. No masses. No hepatomegaly.  Extremities:  Good distal pulses throughout. No lower extremity edema.  Musculoskeletal :moving all extremities.  Neuro:   alert and oriented x3.    EKG  Procedure date:  01/30/2010  Findings:      NSR.  71 bpm.  Occasional PVC ( present on previous EKGs)  Impression & Recommendations:  Problem # 1:  CHEST PAIN UNSPECIFIED (ICD-786.50) I am not convinced the patients chest pain was cardiac.  He has not had any since that one event and continues to remain active. I would recomm continuing the Nexium  He could consider a gi evaluation. He has had negative MIBIs in past.  Last one was in 2005.    No evidence of ischemia.   Normal LV function.  EKG in the past has had PVCs.  He is asymptomatic.  I would not  pursue further testing unless develops symptoms.  Problem # 2:  HYPERTENSION (ICD-401.9) Good control His updated medication list for this problem includes:    Micardis 80 Mg Tabs (Telmisartan) .Marland Kitchen... 1 tablet by mouth once a day  Problem # 3:  HYPERLIPIDEMIA (ICD-272.4) The patient had been on Crestor in the past.  Lipids were good   He gets this medicine from [place] for $3.  He would like to go back on it.  I have represcribed. The following medications were removed from the medication list:    Lovaza 1 Gm Caps (Omega-3-acid ethyl esters) .Marland Kitchen... Take two tablets by mouth twice daily.    Crestor 20 Mg Tabs (Rosuvastatin calcium) .Marland Kitchen... Take one tablet each evening at bedtime.    Crestor 20 Mg Tabs (Rosuvastatin calcium) .Marland Kitchen... Take one tablet every evening at bedtime.    Pravachol 80 Mg Tabs (Pravastatin sodium) .Marland Kitchen... Take 1 tab once daily His updated medication list for this problem includes:    Pravachol 80 Mg Tabs (Pravastatin sodium) .Marland Kitchen... Take one tablet daily  Other Orders: EKG w/ Interpretation (93000)

## 2011-01-23 NOTE — Progress Notes (Signed)
Summary: Refill Request  Phone Note Refill Request Call back at [phone] Message from:  Patient on June 05, 2010 4:41 PM  Refills Requested: Medication #1:  MICARDIS 80 MG TABS 1 tablet by mouth once a day   Dosage confirmed as above?Dosage Confirmed   Supply Requested: 1 month [facility] [place] [phone]. Can contact pt at [phone]  Next Appointment Scheduled: none Initial call taken by: Harold Barban,  June 05, 2010 4:42 PM    Prescriptions: MICARDIS 80 MG TABS (TELMISARTAN) 1 tablet by mouth once a day  #30 x 0   Entered by:   Army Fossa CMA   Authorized by:   Loreen Freud DO   Signed by:   Army Fossa CMA on 06/05/2010   Method used:   Electronically to        [facility] [phone]* (retail)       [address] [place], [place]  [address]       Ph: [phone]       Fax: [phone]   RxID:   [id number]

## 2011-01-23 NOTE — Letter (Signed)
Summary: CMN for Diabetes Supplies/[facility]  CMN for Diabetes Supplies/[facility]   Imported By: Lanelle Bal 09/07/2010 08:43:54  _____________________________________________________________________  External Attachment:    Type:   Image     Comment:   External Document

## 2011-01-23 NOTE — Assessment & Plan Note (Signed)
Summary: WENT TO ER, SAID TO SEE DR LOWNE///SPH   Vital Signs:  Patient profile:   77 year old male Height:      66 inches Weight:      206 pounds BMI:     33.37 Temp:     98.1 degrees F oral Pulse rate:   86 / minute Pulse rhythm:   regular BP sitting:   140 / 80  (left arm) Cuff size:   large  Vitals Entered By: Army Fossa CMA (January 17, 2010 3:18 PM) CC: Pt here for follow up. Pt states he was not started on any new medications. , Heartburn   History of Present Illness:  Heartburn      This is a 77 year old man who presents with Heartburn.  Pt here f/u chest pain from ER.  Pt diagnosed with GERD.   He has stopped his Nexium.   .  The patient complains of acid reflux and chest pain, but denies sour taste in mouth, epigastric pain, trouble swallowing, weight loss, and weight gain.  The patient denies the following alarm features: melena, dysphagia, hematemesis, vomiting, involuntary weight loss >5%, and history of anemia.  The patient has found the following treatments to be effective: dietary changes and a PPI.  Treatment that was tried and either found to be ineffective or stopped due to problems include weight loss, elevating the head of bed, an antacid, and an H2 blocker.  Pt was in ER 01/12/2010.   Pt ony complaint now is stomach growling a lot.    Current Medications (verified): 1)  Nexium 40 Mg Cpdr (Esomeprazole Magnesium) .... 40 Mg By Mouth Once A Day 2)  Uroxatral 10 Mg Tb24 (Alfuzosin Hcl) .... Take 1 Tablet By Mouth Once A Day 3)  Micardis 80 Mg Tabs (Telmisartan) .Marland Kitchen.. 1 Tablet By Mouth Once A Day 4)  Alclometasone Dipropionate 0.05 % Oint (Alclometasone Dipropionate) .... Apply 5)  Metformin Hcl 1000 Mg  Tabs (Metformin Hcl) .Marland Kitchen.. 1 By Mouth Two Times A Day 6)  Pravachol 80 Mg  Tabs (Pravastatin Sodium) .... Take One Tablet Daily 7)  Synthroid 75 Mcg  Tabs (Levothyroxine Sodium) .Marland Kitchen.. 1 By Mouth Once Daily 8)  Lovaza 1 Gm  Caps (Omega-3-Acid Ethyl Esters) .... Take  Two Tablets By Mouth Twice Daily. 9)  Darvon-N 100 Mg  Tabs (Propoxyphene Napsylate) .Marland Kitchen.. 1 - 2 By Mouth Q 6 Hrs As Needed Pain 10)  Crestor 20 Mg Tabs (Rosuvastatin Calcium) .... Take One Tablet Each Evening At Bedtime. 11)  Finasteride 5 Mg Tabs (Finasteride) .... One By Mouth Daily 12)  Gabapentin 100 Mg Caps (Gabapentin) .Marland Kitchen.. 1 Q 8 Hrs As Needed For Migratory Headache 13)  Crestor 20 Mg Tabs (Rosuvastatin Calcium) .... Take One Tablet Every Evening At Bedtime. 14)  Pravachol 80 Mg Tabs (Pravastatin Sodium) .... Take 1 Tab Once Daily 15)  Ultram 50 Mg Tabs (Tramadol Hcl) .Marland Kitchen.. 1 By Mouth Every 6 Hrs As Needed.  Allergies: 1)  ! Prednisone (Prednisone) 2)  ! Verapamil Hcl Cr (Verapamil Hcl) 3)  ! Clonidine Hcl (Clonidine Hcl) 4)  ! Atenolol (Atenolol) 5)  * Prednisone  Past History:  Past medical, surgical, family and social histories (including risk factors) reviewed for relevance to current acute and chronic problems.  Past Medical History: Reviewed history from 08/21/2007 and no changes required. Diabetes mellitus, type II Hyperlipidemia Hypertension Colonic polyps, hx of GERD Hypothyroidism  Past Surgical History: Reviewed history from 08/21/2007 and no changes required. Appendectomy  2003  Family History: Reviewed history and no changes required.  Social History: Reviewed history from 06/15/2008 and no changes required. 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 Lungs:  Normal respiratory effort, chest expands symmetrically. Lungs are clear to auscultation, no crackles or wheezes. Heart:  normal rate and no murmur.   Abdomen:  Bowel sounds positive,abdomen soft and non-tender without masses, organomegaly or  hernias noted. Psych:  Oriented X3 and normally interactive.     Impression & Recommendations:  Problem # 1:  GERD (ICD-530.81) Pt was off nexium--- if symptoms persist on nexium --f/u GI  His updated medication list for this problem includes:    Nexium 40 Mg Cpdr (Esomeprazole magnesium) .Marland KitchenMarland KitchenMarland KitchenMarland Kitchen 40 mg by mouth once a day  Diagnostics Reviewed:  Discussed lifestyle modifications, diet, antacids/medications, and preventive measures. Handout provided.   Problem # 2:  CHEST PAIN UNSPECIFIED (ICD-786.50) probably GI etiology---pt is better with nexium but pt family and ER advised by to see cardiology Orders: Cardiology Referral (Cardiology)  Complete Medication List: 1)  Nexium 40 Mg Cpdr (Esomeprazole magnesium) .... 40 mg by mouth once a day 2)  Uroxatral 10 Mg Tb24 (Alfuzosin hcl) .... Take 1 tablet by mouth once a day 3)  Micardis 80 Mg Tabs (Telmisartan) .Marland Kitchen.. 1 tablet by mouth once a day 4)  Alclometasone Dipropionate 0.05 % Oint (Alclometasone dipropionate) .... Apply 5)  Metformin Hcl 1000 Mg Tabs (Metformin hcl) .Marland Kitchen.. 1 by mouth two times a day 6)  Pravachol 80 Mg Tabs (Pravastatin sodium) .... Take one tablet daily 7)  Synthroid 75 Mcg Tabs (Levothyroxine sodium) .Marland Kitchen.. 1 by mouth once daily 8)  Lovaza 1 Gm Caps (Omega-3-acid ethyl esters) .... Take two tablets by mouth twice daily. 9)  Darvon-n 100 Mg Tabs (Propoxyphene napsylate) .Marland Kitchen.. 1 - 2 by mouth q 6 hrs as needed pain 10)  Crestor 20 Mg Tabs (Rosuvastatin calcium) .... Take one tablet each evening at bedtime. 11)  Finasteride 5 Mg Tabs (Finasteride) .... One by mouth daily 12)  Gabapentin 100 Mg Caps (Gabapentin) .Marland Kitchen.. 1 q 8 hrs as needed for migratory headache 13)  Crestor 20 Mg Tabs (Rosuvastatin calcium) .... Take one tablet every evening at bedtime. 14)  Pravachol 80 Mg Tabs (Pravastatin sodium) .... Take 1 tab once daily 15)  Ultram 50 Mg Tabs (Tramadol hcl) .Marland Kitchen.. 1 by mouth every 6 hrs as needed.

## 2011-01-23 NOTE — Progress Notes (Signed)
Summary: NEEDS PAPER PRESCRIPTION FOR NEXIUM  Phone Note Call from Patient Call back at [phone]   Caller: Patient Summary of Call: PATIENT NEEDS 30 DAY REFILL FOR HIS NEXIUM 80 MG---HE IS GOING ON A TRIP SOON AND NEEDS A REFILL AT A LOCAL PHARMACY   PLEASE CALL HIM AT [phone] WHEN PRESCRIPTION IS READY FOR PICKUP Initial call taken by: Jerolyn Shin,  May 18, 2010 11:41 AM  Follow-up for Phone Call        Pt states she is taking 2 a day, rx says once daily, please clarify. Send to [facility] on [address]. Army Fossa CMA  May 18, 2010 11:47 AM   Additional Follow-up for Phone Call Additional follow up Details #1::        rx printed---did GI increase dose to two times a day? or did ER?  If he needs two times a day he needs to see GI. Additional Follow-up by: Loreen Freud DO,  May 18, 2010 12:15 PM    Additional Follow-up for Phone Call Additional follow up Details #2::    Pt is aware, he states that ER increased his med. he will call GI and make appt. Army Fossa CMA  May 18, 2010 12:54 PM   New/Updated Medications: NEXIUM 40 MG CPDR (ESOMEPRAZOLE MAGNESIUM) 40 mg by mouth two times a day Prescriptions: NEXIUM 40 MG CPDR (ESOMEPRAZOLE MAGNESIUM) 40 mg by mouth two times a day  #60 x 5   Entered by:   Army Fossa CMA   Authorized by:   Loreen Freud DO   Signed by:   Army Fossa CMA on 05/18/2010   Method used:   Faxed to ...       [facility] [address] (retail)       [phone]       [place], [place]  [address]       Ph: [phone]       Fax: [phone]   RxID:   [id number] NEXIUM 40 MG CPDR (ESOMEPRAZOLE MAGNESIUM) 40 mg by mouth two times a day  #60 x 5   Entered and Authorized by:   Loreen Freud DO   Signed by:   Loreen Freud DO on 05/18/2010   Method used:   Print then Give to Patient   RxID:   [id number]

## 2011-01-23 NOTE — Progress Notes (Signed)
Summary: PAPER COPY OF MICARDIS 90 DAY PRESCRIPTION PLUS REFILLS  Phone Note Call from Patient Call back at [phone]   Caller: Patient Summary of Call: PATIENT SAYS HE IS GOING TO [place] ON THURSDAY MORNING AND HE NEEDS A PAPER PRESCRIPTION FOR 90 DAYS PLUS REFILLS FOR MICARDIS 80 MG  HE WOULD LIKE TO COME BY BETWEEN 3:00 AND 4:00 ON WED AFTERNOON TO PICK UP HIS PRESCRIPTION (HIS WIFE  HAS A PRESCRIPTION TOO) Initial call taken by: Jerolyn Shin,  January 10, 2010 12:11 PM  Follow-up for Phone Call        pt aware rx is ready. Army Fossa CMA  January 10, 2010 1:06 PM    Prescriptions: MICARDIS 80 MG TABS (TELMISARTAN) 1 tablet by mouth once a day  #90 x 1   Entered by:   Army Fossa CMA   Authorized by:   Loreen Freud DO   Signed by:   Army Fossa CMA on 01/10/2010   Method used:   Print then Give to Patient   RxID:   [id number] MICARDIS 80 MG TABS (TELMISARTAN) 1 tablet by mouth once a day  #90 x 1   Entered by:   Army Fossa CMA   Authorized by:   Loreen Freud DO   Signed by:   Army Fossa CMA on 01/10/2010   Method used:   Print then Give to Patient   RxID:   [id number]

## 2011-01-23 NOTE — Progress Notes (Signed)
Summary: refill  Phone Note Refill Request Message from:  Patient on October 17, 2010 9:24 AM  Refills Requested: Medication #1:  METFORMIN HCL 1000 MG  TABS 1 by mouth two times a day   Supply Requested: 1 month  Medication #2:  METFORMIN HCL 1000 MG  TABS 1 by mouth two times a day   Dosage confirmed as above?Dosage Confirmed   Supply Requested: 3 months patient will pick rx up wednesday [id number] - 90 day supply -------------------please call in 30 day supply to [facility]  Initial call taken by: Okey Regal Spring,  October 17, 2010 9:28 AM  Follow-up for Phone Call        90 day supply placed at the front for pick-up Follow-up by: Shonna Chock CMA,  October 17, 2010 4:01 PM    Prescriptions: METFORMIN HCL 1000 MG  TABS (METFORMIN HCL) 1 by mouth two times a day  #180 x 1   Entered by:   Shonna Chock CMA   Authorized by:   Loreen Freud DO   Signed by:   Shonna Chock CMA on 10/17/2010   Method used:   Print then Give to Patient   RxID:   [id number] METFORMIN HCL 1000 MG  TABS (METFORMIN HCL) 1 by mouth two times a day  #60 x 0   Entered by:   Shonna Chock CMA   Authorized by:   Loreen Freud DO   Signed by:   Shonna Chock CMA on 10/17/2010   Method used:   Electronically to        [facility] [address] (retail)       [phone]       [place], [place]  [address]       Ph: [phone]       Fax: [phone]   RxID:   [id number]

## 2011-01-23 NOTE — Assessment & Plan Note (Signed)
Summary: DM follow-up/scm   Vital Signs:  Patient profile:   77 year old male Weight:      198.6 pounds Temp:     98.8 degrees F oral Pulse rate:   72 / minute Pulse rhythm:   regular BP sitting:   130 / 70  (right arm)  Vitals Entered By: Almeta Monas CMA Duncan Dull) (August 21, 2010 9:49 AM) CC: F/U on DM, pt also wants to change Pravachol. Comments Pt also wanted to let Dr.Lowne know that he broke his tailbone in May 2011   History of Present Illness:  Hyperlipidemia follow-up      This is a 77 year old man who presents for Hyperlipidemia follow-up.  pt stopped pravachol because he was having trouble swallowing it.  The patient denies muscle aches, GI upset, abdominal pain, flushing, itching, constipation, diarrhea, and fatigue.  The patient denies the following symptoms: chest pain/pressure, exercise intolerance, dypsnea, palpitations, syncope, and pedal edema.  Dietary compliance has been good.    Type 1 diabetes mellitus follow-up      The patient is also here for Type 2 diabetes mellitus follow-up.  The patient denies polyuria, polydipsia, blurred vision, self managed hypoglycemia, hypoglycemia requiring help, weight loss, weight gain, and numbness of extremities.  The patient denies the following symptoms: neuropathic pain, chest pain, vomiting, orthostatic symptoms, poor wound healing, intermittent claudication, vision loss, and foot ulcer.  Since the last visit the patient reports good dietary compliance, compliance with medications, exercising regularly, and not monitoring blood glucose.  Since the last visit, the patient reports having had eye care by an ophthalmologist.  Pt not monitoring BS because his machine broke.  Current Medications (verified): 1)  Nexium 40 Mg Cpdr (Esomeprazole Magnesium) .... 40 Mg By Mouth Two Times A Day 2)  Uroxatral 10 Mg Tb24 (Alfuzosin Hcl) .... Take 1 Tablet By Mouth Once A Day 3)  Micardis 80 Mg Tabs (Telmisartan) .Marland Kitchen.. 1 Tablet By Mouth Once A  Day 4)  Metformin Hcl 1000 Mg  Tabs (Metformin Hcl) .Marland Kitchen.. 1 By Mouth Two Times A Day 5)  Synthroid 75 Mcg  Tabs (Levothyroxine Sodium) .Marland Kitchen.. 1 By Mouth Once Daily 6)  Darvon-N 100 Mg  Tabs (Propoxyphene Napsylate) .Marland Kitchen.. 1 - 2 By Mouth Q 6 Hrs As Needed Pain 7)  Finasteride 5 Mg Tabs (Finasteride) .... One By Mouth Daily 8)  Zocor 20 Mg Tabs (Simvastatin) .Marland Kitchen.. 1 By Mouth At Bedtime  Allergies (verified): 1)  ! Prednisone (Prednisone) 2)  ! Verapamil Hcl Cr (Verapamil Hcl) 3)  ! Clonidine Hcl (Clonidine Hcl) 4)  ! Atenolol (Atenolol) 5)  * Prednisone  Past History:  Past medical, surgical, family and social histories (including risk factors) reviewed for relevance to current acute and chronic problems.  Past Medical History: Reviewed history from 01/30/2010 and no changes required. Diabetes mellitus, type II Hyperlipidemia Hypertension Colonic polyps, hx of GERD Hypothyroidism Chest pain.  Past Surgical History: Reviewed history from 08/21/2007 and no changes required. Appendectomy  2003  Family History: Reviewed history from 01/30/2010 and no changes required. Unknown  Social History: Reviewed history from 06/15/2008 and no changes required. 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 Lungs are clear to auscultation, no crackles or wheezes. Heart:  normal rate and no murmur.  Extremities:  No clubbing, cyanosis, edema, or deformity noted with normal full range of motion of all joints.   Skin:  Intact without suspicious lesions or rashes Psych:  Oriented X3, normally interactive, good eye contact, not anxious appearing, and not depressed appearing.    Diabetes Management Exam:    Foot Exam (with socks  and/or shoes not present):       Sensory-Pinprick/Light touch:          Left medial foot (L-4): normal          Left dorsal foot (L-5): normal          Left lateral foot (S-1): normal          Right medial foot (L-4): normal          Right dorsal foot (L-5): normal          Right lateral foot (S-1): normal       Sensory-Monofilament:          Left foot: normal          Right foot: normal       Inspection:          Left foot: normal          Right foot: normal       Nails:          Left foot: normal          Right foot: normal    Eye Exam:       Eye Exam done elsewhere   Impression & Recommendations:  Problem # 1:  HYPERLIPIDEMIA (ICD-272.4)  The following medications were removed from the medication list:    Pravachol 80 Mg Tabs (Pravastatin sodium) .Marland Kitchen... Take one tablet daily His updated medication list for this problem includes:    Zocor 20 Mg Tabs (Simvastatin) .Marland Kitchen... 1 by mouth at bedtime  Labs Reviewed: SGOT: 16 (03/14/2010)   SGPT: 13 (03/14/2010)   HDL:40.90 (03/14/2010), 40.30 (07/11/2009)  LDL:60 (03/14/2010), 82 (07/11/2009)  Chol:114 (03/14/2010), 138 (07/11/2009)  Trig:65.0 (03/14/2010), 77.0 (07/11/2009)  Problem # 2:  DIABETES MELLITUS, TYPE II (ICD-250.00)  His updated medication list for this problem includes:    Micardis 80 Mg Tabs (Telmisartan) .Marland Kitchen... 1 tablet by mouth once a day    Metformin Hcl 1000 Mg Tabs (Metformin hcl) .Marland Kitchen... 1 by mouth two times a day  Labs Reviewed: Creat: 0.9 (03/14/2010)    Reviewed HgBA1c results: 6.0 (03/14/2010)  6.1 (07/11/2009)  Complete Medication List: 1)  Nexium 40 Mg Cpdr (Esomeprazole magnesium) .... 40 mg by mouth two times a day 2)  Uroxatral 10 Mg Tb24 (Alfuzosin hcl) .... Take 1 tablet by mouth once a day 3)  Micardis 80 Mg Tabs (Telmisartan) .Marland Kitchen.. 1 tablet by mouth once a day 4)  Metformin Hcl 1000 Mg Tabs (Metformin hcl) .Marland Kitchen.. 1 by mouth two times a day 5)  Synthroid 75 Mcg Tabs (Levothyroxine sodium) .Marland Kitchen.. 1 by  mouth once daily 6)  Darvon-n 100 Mg Tabs (Propoxyphene napsylate) .Marland Kitchen.. 1 - 2 by mouth q 6 hrs as needed pain 7)  Finasteride 5 Mg Tabs (Finasteride) .... One by mouth daily 8)  Zocor 20 Mg Tabs (Simvastatin) .Marland Kitchen.. 1 by mouth at bedtime  Other Orders: Influenza Vaccine MCR ([address])  Patient Instructions: 1)  Please schedule a follow-up appointment in 6 months .  Prescriptions: ZOCOR 20 MG TABS (SIMVASTATIN) 1 by mouth at bedtime  #30 x 2   Entered and Authorized by:   Loreen Freud DO  Signed by:   Loreen Freud DO on 08/21/2010   Method used:   Electronically to        [facility] [address] (retail)       [phone]       [place], [place]  [address]       Ph: [phone]       Fax: [phone]   RxID:   [id number]    Immunizations Administered:  Influenza Vaccine # 1:    Vaccine Type: Fluvax MCR    Site: right deltoid    Mfr: Merck    Dose: 0.5 ml    Route: IM    Given by: Almeta Monas CMA (AAMA)    Exp. Date: 06/23/2011    Lot #: [id number]    VIS given: 07/17/07 version given August 21, 2010.

## 2011-01-23 NOTE — Progress Notes (Signed)
Summary: REFILL REQUEST  Phone Note Refill Request Call back at [phone]   Refills Requested: Medication #1:  MICARDIS 80 MG TABS 1 tablet by mouth once a day   Dosage confirmed as above?Dosage Confirmed  Medication #2:  NEXIUM 40 MG CPDR 40 mg by mouth two times a day   Dosage confirmed as above?Dosage Confirmed  Medication #3:  METFORMIN HCL 1000 MG  TABS 1 by mouth two times a day   Dosage confirmed as above?Dosage Confirmed  Medication #4:  SYNTHROID 75 MCG  TABS 1 by mouth once daily   Dosage confirmed as above?Dosage Confirmed PT WILL PICK UP TOMORROW MORNING 09/15/10 @ 8 A.M.    Method Requested: Pick up at Office Initial call taken by: Lavell Islam,  September 14, 2010 12:00 PM  Follow-up for Phone Call        [place], he does have an appt scheduled for Oct 14th labs were due in August, last OV was 08/21/10 PLease advise. Follow-up by: Almeta Monas CMA Duncan Dull),  September 14, 2010 3:44 PM  Additional Follow-up for Phone Call Additional follow up Details #1::        ok to give rx----pt needs to keep oct appointment --make sure he is fasting Additional Follow-up by: Loreen Freud DO,  September 14, 2010 3:48 PM    Prescriptions: SYNTHROID 75 MCG  TABS (LEVOTHYROXINE SODIUM) 1 by mouth once daily  #30 x 3   Entered by:   Almeta Monas CMA (AAMA)   Authorized by:   Loreen Freud DO   Signed by:   Almeta Monas CMA (AAMA) on 09/14/2010   Method used:   Print then Give to Patient   RxID:   [id number] METFORMIN HCL 1000 MG  TABS (METFORMIN HCL) 1 by mouth two times a day  #180 x 1   Entered by:   Almeta Monas CMA (AAMA)   Authorized by:   Loreen Freud DO   Signed by:   Almeta Monas CMA (AAMA) on 09/14/2010   Method used:   Print then Give to Patient   RxID:   [id number] MICARDIS 80 MG TABS (TELMISARTAN) 1 tablet by mouth once a day  #90 Each x 1   Entered by:   Almeta Monas CMA (AAMA)  Authorized by:   Loreen Freud DO   Signed by:   Almeta Monas CMA (AAMA) on 09/14/2010   Method used:   Print then Give to Patient   RxID:   [id number] NEXIUM 40 MG CPDR (ESOMEPRAZOLE MAGNESIUM) 40 mg by mouth two times a day  #60 x 1   Entered by:   Almeta Monas CMA (AAMA)   Authorized by:   Loreen Freud DO   Signed by:   Almeta Monas CMA (AAMA) on 09/14/2010   Method used:   Print then Give to Patient   RxID:   [id number]

## 2011-01-23 NOTE — Progress Notes (Signed)
Summary: refill  Phone Note Refill Request Message from:  Patient on July 10, 2010 11:21 AM  Refills Requested: Medication #1:  FINASTERIDE 5 MG TABS one by mouth daily  Medication #2:  SYNTHROID 75 MCG  TABS 1 by mouth once daily  Medication #3:  PRAVACHOL 80 MG  TABS take one tablet daily patient will pick rx up - he has it fill at [place] - will pick up [id number]  Initial call taken by: Okey Regal Spring,  July 10, 2010 11:23 AM  Follow-up for Phone Call        call inform pt that all other rx were filled on 11-02-10 #90 3 so he should have refills on file. pt aware rx ready for pickup.................Marland KitchenFelecia Deloach CMA  July 11, 2010 9:05 AM     Prescriptions: PRAVACHOL 80 MG  TABS (PRAVASTATIN SODIUM) take one tablet daily  #90 x 0   Entered by:   Jeremy Johann CMA   Authorized by:   Loreen Freud DO   Signed by:   Jeremy Johann CMA on 07/11/2010   Method used:   Print then Give to Patient   RxID:   [id number]

## 2011-01-23 NOTE — Progress Notes (Signed)
Summary: REFILL  Phone Note Refill Request Message from:  Patient on October 19, 2010 10:06 AM  Refills Requested: Medication #1:  ZOCOR 40 MG TABS Take 1 tab at bedtime.   Dosage confirmed as above?Dosage Confirmed   Supply Requested: 3 months  Medication #2:   PT WILL PICK UP RX TOMORROW MORNING   Method Requested: Pick up at Office Next Appointment Scheduled: 1.6.12 Initial call taken by: Lavell Islam,  October 19, 2010 10:07 AM    Prescriptions: ZOCOR 40 MG TABS (SIMVASTATIN) Take 1 tab at bedtime  #90 x 0   Entered by:   Almeta Monas CMA (AAMA)   Authorized by:   Loreen Freud DO   Signed by:   Almeta Monas CMA (AAMA) on 10/19/2010   Method used:   Print then Give to Patient   RxID:   [id number]  90 days supply left at check In..... Almeta Monas CMA Duncan Dull)  October 19, 2010 1:16 PM

## 2011-01-23 NOTE — Progress Notes (Signed)
Summary: RX  Phone Note Refill Request Call back at [phone] Message from:  Patient on October 12, 2010 3:28 PM  Refills Requested: Medication #1:  FINASTERIDE 5 MG TABS one by mouth daily   Dosage confirmed as above?Dosage Confirmed   Supply Requested: 1 month [facility] ON [address]  Initial call taken by: Freddy Jaksch,  October 12, 2010 3:28 PM    Prescriptions: FINASTERIDE 5 MG TABS (FINASTERIDE) one by mouth daily  #30 x 0   Entered by:   Almeta Monas CMA (AAMA)   Authorized by:   Loreen Freud DO   Signed by:   Almeta Monas CMA (AAMA) on 10/12/2010   Method used:   Faxed to ...       [facility] [address] (retail)       [phone]       [place], [place]  [address]       Ph: [phone]       Fax: [phone]   RxID:   [id number]

## 2011-01-23 NOTE — Progress Notes (Signed)
Summary: Check pt status  ---- Converted from flag ---- ---- 11/07/2010 3:42 PM, Loreen Freud DO wrote: call pt to see if itching has stopped ------------------------------  spk with pt and he stated that the itching has stopped and he thinks the Lipitor is great. He wanted me to tell you thank you...Marland KitchenMarland KitchenMarland Kitchen Almeta Monas CMA Duncan Dull)  November 21, 2010 9:25 AM

## 2011-01-23 NOTE — Progress Notes (Signed)
Summary: 30 DAY LOCAL PRESCRIPTION FOR LEVOTHYROXINE  Phone Note Refill Request Call back at Home Phone [phone]   Refills Requested: Medication #1:  SYNTHROID 75 MCG  TABS 1 by mouth once daily   Last Refilled: 11/02/2009 last filled 11/10, Last OV 06/27/10  and  upcoming OV scheduled for 08/21/10       Almeta Monas CMA (AAMA)  August 18, 2010 1:53 PM   Caller: Patient Summary of Call: NEEDS 30 DAY PRESCRIPTION FOR LEVOTHYROXINE 75 MCG BECAUSE HE HAS ONE 90 DAY REFILL LEFT AT [facility], BUT THEY WILLNOT BE GOING DOWN UNTIL LATER IN MONTH AND HE ONLY HAS ONE PILL LEFT  PLEASE  CALL 30 DAY SUPPLY TO [facility] [address] Initial call taken by: Jerolyn Shin,  August 18, 2010 11:53 AM  Follow-up for Phone Call        ok to refill Follow-up by: Loreen Freud DO,  August 18, 2010 2:33 PM    Prescriptions: SYNTHROID 75 MCG  TABS (LEVOTHYROXINE SODIUM) 1 by mouth once daily  #30 x 1   Entered by:   Almeta Monas CMA (AAMA)   Authorized by:   Loreen Freud DO   Signed by:   Almeta Monas CMA (AAMA) on 08/18/2010   Method used:   Faxed to ...       [facility] [address] (retail)       [phone]       [place], [place]  [address]       Ph: [phone]       Fax: [phone]   RxID:   [id number]  Pt made aware.       Almeta Monas CMA Duncan Dull)  August 18, 2010 2:50 PM

## 2011-01-23 NOTE — Progress Notes (Signed)
Summary: RX  Phone Note Call from Patient Call back at [phone]   Caller: Patient Summary of Call: PATIENT CAME IN NEEDING A 30 DAY SUPPLY OF METFORMIN SENT TO [facility] ON [address]. HE IS COMPLETE OUT Initial call taken by: Freddy Jaksch,  March 06, 2010 1:14 PM  Follow-up for Phone Call        refilled meds, pt is aware but he is going to call and schedule an appt for labs. Army Fossa CMA  March 06, 2010 2:50 PM     Prescriptions: METFORMIN HCL 1000 MG  TABS (METFORMIN HCL) 1 by mouth two times a day  #60 x 0   Entered by:   Army Fossa CMA   Authorized by:   Loreen Freud DO   Signed by:   Army Fossa CMA on 03/06/2010   Method used:   Electronically to        [facility] [address] (retail)       [phone]       [place], [place]  [address]       Ph: [phone]       Fax: [phone]   RxID:   [id number]

## 2011-01-23 NOTE — Progress Notes (Signed)
Summary: NEEDS METFORMIN PRESCRIPTION  Phone Note Call from Patient Call back at Home Phone [phone]   Caller: Patient Summary of Call: PATIENT IS OUT OF METFORMIN AND IS GOING TO [facility] TOMORROW---CAN HE GET A PRESCRIPTION PRINTED OUT  Initial call taken by: Jerolyn Shin,  April 05, 2010 4:39 PM  Follow-up for Phone Call        prescription printed and given to patient needs to take to [facility].....Marland KitchenMarland KitchenDoristine Devoid  April 05, 2010 4:41 PM     Prescriptions: METFORMIN HCL 1000 MG  TABS (METFORMIN HCL) 1 by mouth two times a day  #180 x 1   Entered by:   Doristine Devoid   Authorized by:   Neena Rhymes MD   Signed by:   Doristine Devoid on 04/05/2010   Method used:   Printed then faxed to ...       [facility] [address] (retail)       [phone]       [place], [place]  [address]       Ph: [phone]       Fax: [phone]   RxID:   [id number]

## 2011-01-23 NOTE — Progress Notes (Signed)
Summary: REFILL REQUEST  Phone Note Refill Request Message from:  Patient on August 29, 2010 11:17 AM  Refills Requested: Medication #1:  UROXATRAL 10 MG TB24 Take 1 tablet by mouth once a day   Supply Requested: 1 month [facility] [address]  Next Appointment Scheduled: 10/06/10 Initial call taken by: Lavell Islam,  August 29, 2010 11:18 AM  Follow-up for Phone Call        Last OV 08/21/10 and Last filled 11/02/09. Please advise. Almeta Monas CMA Duncan Dull)  August 29, 2010 11:24 AM   Additional Follow-up for Phone Call Additional follow up Details #1::        please make sure pt is still taking it Additional Follow-up by: Loreen Freud DO,  August 29, 2010 11:36 AM    Additional Follow-up for Phone Call Additional follow up Details #2::    spk with pt and he says he is still taking this medication.   Almeta Monas CMA Duncan Dull)  August 29, 2010 12:57 PM   Additional Follow-up for Phone Call Additional follow up Details #3:: Details for Additional Follow-up Action Taken: refill 1 month 5 refills Additional Follow-up by: Loreen Freud DO,  August 29, 2010 1:06 PM  Prescriptions: UROXATRAL 10 MG TB24 (ALFUZOSIN HCL) Take 1 tablet by mouth once a day  #30 x 5   Entered by:   Almeta Monas CMA (AAMA)   Authorized by:   Loreen Freud DO   Signed by:   Almeta Monas CMA (AAMA) on 08/29/2010   Method used:   Faxed to ...       [facility] [address] (retail)       [phone]       [place], [place]  [address]       Ph: [phone]       Fax: [phone]   RxID:   [id number]  Pt aware. Almeta Monas CMA Duncan Dull)  August 29, 2010 1:44 PM

## 2011-01-23 NOTE — Progress Notes (Signed)
Summary: -SHOULD HE OR SHOULDN'T HE TAKE CRESTOR  Phone Note Call from Patient Call back at [phone]   Summary of Call: PT CAME IN TALKING ABOUT HIM BEING ON CRESTOR MED AND WANT TO KNOW WHO TOLD HIM TO STOP IT AND SHOW HE BE TAKING IT. PLEASE GIVE HIM A CALL. Initial call taken by: Freddy Jaksch,  July 06, 2010 11:30 AM  Follow-up for Phone Call        left message to call office.............Marland KitchenFelecia Deloach CMA  July 06, 2010 11:40 AM  according to med list pt is on PRAVACHOL 80 MG  TABS take one tablet daily. crestor was removed from list on 01-30-10.......Marland KitchenFelecia Deloach CMA  July 06, 2010 11:42 AM   SPOKE WITH PT, PT HAS NOT BEEN TAKING CHOLESTROL MED AS RX ADVISE PT TO RESTART MED AND THAT HE IS TO TAKE PRAVACHOL NOT CRESTOR, PT OK...........Marland KitchenFelecia Deloach CMA  July 06, 2010 1:27 PM   Additional Follow-up for Phone Call Additional follow up Details #1::        PT STATES THAT THE PRAVACHOL CAUSES HIM TO GET DRY MOUTH WHEN HE TAKES IT. PLS ADVISE............Marland KitchenFelecia Deloach CMA  July 06, 2010 1:28 PM     Additional Follow-up for Phone Call Additional follow up Details #2::    Dr Tenny Craw is actually the one who re prescribed pravachol and crestor ----if he did better with crestor--- take that and stop the pravachol----please make sure he has labs scheduled within the 3 months.  Follow-up by: Loreen Freud DO,  July 06, 2010 3:49 PM  Additional Follow-up for Phone Call Additional follow up Details #3:: Details for Additional Follow-up Action Taken: pt aware will continue with pravachol, labs scheduled,hep, lipid 272.4...............Marland KitchenFelecia Deloach CMA  July 06, 2010 4:26 PM

## 2011-01-23 NOTE — Progress Notes (Signed)
Summary: ITCHING HAS STOPPED  Phone Note Call from Patient   Caller: Patient Summary of Call: PT WALKED IN TO INFORM us THAT THE ITCHING HAD STOPPED WITHIN A 2 DAY PERIOD. HE IS NOW  WANTING TO KNOW IF DR. Laury Axon FOUND A REPLACEMENT FOR THE ZOCOR. PLEASE ADVISE [phone]. Initial call taken by: Lavell Islam,  November 13, 2010 4:21 PM  Follow-up for Phone Call        Please advise Follow-up by: Almeta Monas CMA Duncan Dull),  November 13, 2010 4:48 PM  Additional Follow-up for Phone Call Additional follow up Details #1::        Try Lipitor 10 mg #30  1 by mouth at bedtime---going generic this month,  2 refills---- can give 30 day free coupon if we have one. Additional Follow-up by: Loreen Freud DO,  November 13, 2010 8:08 PM    Additional Follow-up for Phone Call Additional follow up Details #2::    pt aware, rx faxed, and coupon placed up front for pt to pick-up..................Marland KitchenFelecia Deloach CMA  November 14, 2010 9:36 AM   New/Updated Medications: LIPITOR 10 MG TABS (ATORVASTATIN CALCIUM) Take 1 by mouth at bedtime Prescriptions: LIPITOR 10 MG TABS (ATORVASTATIN CALCIUM) Take 1 by mouth at bedtime  #30 x 2   Entered by:   Jeremy Johann CMA   Authorized by:   Loreen Freud DO   Signed by:   Jeremy Johann CMA on 11/14/2010   Method used:   Faxed to ...       [facility] [address] (retail)       [phone]       [place], [place]  [address]       Ph: [phone]       Fax: [phone]   RxID:   [id number]

## 2011-01-23 NOTE — Progress Notes (Signed)
Summary: Refill Request  Phone Note Refill Request Message from:  Patient on Apr 28, 2010 11:28 AM  Refills Requested: Medication #1:  MICARDIS 80 MG TABS 1 tablet by mouth once a day   Dosage confirmed as above?Dosage Confirmed   Supply Requested: 1 month [facility] on [address] Call patient when ready @ [phone]  Next Appointment Scheduled: none Initial call taken by: Harold Barban,  Apr 28, 2010 11:28 AM  Follow-up for Phone Call        Pt is aware, rx is sent in. Army Fossa CMA  Apr 28, 2010 11:31 AM     Prescriptions: MICARDIS 80 MG TABS (TELMISARTAN) 1 tablet by mouth once a day  #30 x 0   Entered by:   Army Fossa CMA   Authorized by:   Loreen Freud DO   Signed by:   Army Fossa CMA on 04/28/2010   Method used:   Electronically to        [facility] [address] (retail)       [phone]       [place], [place]  [address]       Ph: [phone]       Fax: [phone]   RxID:   [id number]

## 2011-01-23 NOTE — Progress Notes (Signed)
Summary: FINASTERIDE = 30 DAY PRESCRIPTION  Phone Note Call from Patient Call back at CELL - [phone]   Caller: Patient Summary of Call: NEEDS  A 30 DAY PRESCRIPTION CALLED INTO [facility] ON [address] FOR FINASTERIDE --PROSCAR 5 MG---HE  ONLY HAS ONE PILL LEFT  --PLEASE CALL HIM TO LET HIM KNOW THAT PRESCRIPTION HAS BEEN CALLED IN  (STILL HAS REFILLS ON 90 DAY PRESCRIPTION AT [facility]) Initial call taken by: Jerolyn Shin,  May 09, 2010 9:52 AM  Follow-up for Phone Call        Pt is aware meds called in. Army Fossa CMA  May 09, 2010 9:59 AM     Prescriptions: FINASTERIDE 5 MG TABS (FINASTERIDE) one by mouth daily  #30 x 0   Entered by:   Army Fossa CMA   Authorized by:   Loreen Freud DO   Signed by:   Army Fossa CMA on 05/09/2010   Method used:   Electronically to        [facility] [address] (retail)       [phone]       [place], [place]  [address]       Ph: [phone]       Fax: [phone]   RxID:   [id number]

## 2011-01-25 NOTE — Assessment & Plan Note (Signed)
Summary: discuss lab work///sph   Vital Signs:  Patient profile:   77 year old male Weight:      199.0 pounds Pulse rate:   72 / minute Pulse rhythm:   regular BP sitting:   126 / 62  (left arm) Cuff size:   large  Vitals Entered By: Almeta Monas CMA Duncan Dull) (January 16, 2011 3:11 PM) CC: review [facility]   History of Present Illness: Pt here to review labs only  Current Medications (verified): 1)  Nexium 40 Mg Cpdr (Esomeprazole Magnesium) .... 40 Mg By Mouth Two Times A Day 2)  Uroxatral 10 Mg Tb24 (Alfuzosin Hcl) .... Take 1 Tablet By Mouth Once A Day 3)  Micardis 80 Mg Tabs (Telmisartan) .Marland Kitchen.. 1 Tablet By Mouth Once A Day 4)  Metformin Hcl 1000 Mg  Tabs (Metformin Hcl) .Marland Kitchen.. 1 By Mouth Two Times A Day 5)  Synthroid 75 Mcg  Tabs (Levothyroxine Sodium) .Marland Kitchen.. 1 By Mouth Once Daily 6)  Finasteride 5 Mg Tabs (Finasteride) .... One By Mouth Daily 7)  Lipitor 10 Mg Tabs (Atorvastatin Calcium) .... Take 1 By Mouth At Bedtime 8)  Hydroxyzine Hcl 50 Mg Tabs (Hydroxyzine Hcl) .Marland Kitchen.. 1 By Mouth Q6h As Needed Itching  Allergies (verified): 1)  ! Prednisone (Prednisone) 2)  ! Verapamil Hcl Cr (Verapamil Hcl) 3)  ! Clonidine Hcl (Clonidine Hcl) 4)  ! Atenolol (Atenolol) 5)  * Prednisone  Past History:  Past medical, surgical, family and social histories (including risk factors) reviewed for relevance to current acute and chronic problems.  Past Medical History: Reviewed history from 01/30/2010 and no changes required. Diabetes mellitus, type II Hyperlipidemia Hypertension Colonic polyps, hx of GERD Hypothyroidism Chest pain.  Past Surgical History: Reviewed history from 08/21/2007 and no changes required. Appendectomy  2003  Family History: Reviewed history from 01/30/2010 and no changes required. Unknown  Social History: Reviewed history from 06/15/2008 and no changes required. 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     Impression & Recommendations:  Problem # 1:  HYPERTENSION (ICD-401.9) Assessment Improved  His updated medication list for this problem includes:    Micardis 80 Mg Tabs (Telmisartan) .Marland Kitchen... 1 tablet by mouth once a day  BP today: 126/62 Prior BP: 108/62 (11/07/2010)  Labs Reviewed: K+: 4.4 (03/14/2010) Creat: : 0.9 (03/14/2010)   Chol: 205 (10/06/2010)   HDL: 43.20 (10/06/2010)   LDL: 60 (03/14/2010)   TG: 66.0 (10/06/2010)  Problem # 2:  HYPERLIPIDEMIA (ICD-272.4) Assessment: Improved [facility] reviewed with pt His updated medication list for this problem includes:    Lipitor 10 Mg Tabs (Atorvastatin calcium) .Marland Kitchen... Take 1 by mouth at bedtime  Labs Reviewed: SGOT: 15 (10/06/2010)   SGPT: 11 (10/06/2010)   HDL:43.20 (10/06/2010), 40.90 (03/14/2010)  LDL:60 (03/14/2010), 82 (07/11/2009)  Chol:205 (10/06/2010), 114 (03/14/2010)  Trig:66.0 (10/06/2010), 65.0 (03/14/2010)  Problem # 3:  DIABETES MELLITUS, TYPE II (ICD-250.00) Assessment: Improved [facility] reviewed with pt-- to be scanned in His updated medication list for this problem includes:    Micardis 80 Mg Tabs (Telmisartan) .Marland Kitchen... 1 tablet by mouth once a day    Metformin Hcl 1000 Mg Tabs (Metformin hcl) .Marland Kitchen... 1 by mouth two times a day  Labs Reviewed: Creat: 0.9 (03/14/2010)  Reviewed HgBA1c results: 6.0 (03/14/2010)  6.1 (07/11/2009)  Complete Medication List: 1)  Nexium 40 Mg Cpdr (Esomeprazole magnesium) .... 40 mg by mouth two times a day 2)  Uroxatral 10 Mg Tb24 (Alfuzosin hcl) .... Take 1 tablet by mouth once a day 3)  Micardis 80 Mg Tabs (Telmisartan) .Marland Kitchen.. 1 tablet by mouth once a day 4)  Metformin Hcl 1000 Mg Tabs (Metformin hcl) .Marland Kitchen.. 1 by mouth two times a day 5)   Synthroid 75 Mcg Tabs (Levothyroxine sodium) .Marland Kitchen.. 1 by mouth once daily 6)  Finasteride 5 Mg Tabs (Finasteride) .... One by mouth daily 7)  Lipitor 10 Mg Tabs (Atorvastatin calcium) .... Take 1 by mouth at bedtime 8)  Hydroxyzine Hcl 50 Mg Tabs (Hydroxyzine hcl) .Marland Kitchen.. 1 by mouth q6h as needed itching  Patient Instructions: 1)  Please schedule a follow-up appointment in 6 months .  2)  fasting labs before appointment   272.4  401.9   lipid,bmp,hep   Orders Added: 1)  Est. Patient Level II [04540]

## 2011-01-31 NOTE — Progress Notes (Signed)
Summary: Refill  Phone Note Refill Request Call back at [phone] Message from:  Patient on January 23, 2011 4:23 PM  Refills Requested: Medication #1:  LIPITOR 10 MG TABS Take 1 by mouth at bedtime   Dosage confirmed as above?Dosage Confirmed   Supply Requested: 1 month  Medication #2:  METFORMIN HCL 1000 MG  TABS 1 by mouth two times a day   Dosage confirmed as above?Dosage Confirmed   Supply Requested: 1 month  Medication #3:  SYNTHROID 75 MCG  TABS 1 by mouth once daily   Dosage confirmed as above?Dosage Confirmed   Supply Requested: 1 month Pt will be going to [employer] [facility]. Please contact pt when rx's are ready for pickup   Method Requested: Pick up at Office Next Appointment Scheduled: none Initial call taken by: Lavell Islam,  January 23, 2011 4:24 PM    Prescriptions: LIPITOR 10 MG TABS (ATORVASTATIN CALCIUM) Take 1 by mouth at bedtime  #90 x 0   Entered by:   Almeta Monas CMA (AAMA)   Authorized by:   Loreen Freud DO   Signed by:   Almeta Monas CMA (AAMA) on 01/24/2011   Method used:   Print then Give to Patient   RxID:   [id number] SYNTHROID 75 MCG  TABS (LEVOTHYROXINE SODIUM) 1 by mouth once daily  #90 x 0   Entered by:   Almeta Monas CMA (AAMA)   Authorized by:   Loreen Freud DO   Signed by:   Almeta Monas CMA (AAMA) on 01/24/2011   Method used:   Print then Give to Patient   RxID:   [id number] METFORMIN HCL 1000 MG  TABS (METFORMIN HCL) 1 by mouth two times a day  #180 x 0   Entered by:   Almeta Monas CMA (AAMA)   Authorized by:   Loreen Freud DO   Signed by:   Almeta Monas CMA (AAMA) on 01/24/2011   Method used:   Print then Give to Patient   RxID:   [id number]

## 2011-02-06 ENCOUNTER — Telehealth (INDEPENDENT_AMBULATORY_CARE_PROVIDER_SITE_OTHER): Payer: Self-pay | Admitting: *Deleted

## 2011-02-06 ENCOUNTER — Ambulatory Visit (INDEPENDENT_AMBULATORY_CARE_PROVIDER_SITE_OTHER): Payer: Medicare Other | Admitting: Internal Medicine

## 2011-02-06 ENCOUNTER — Encounter: Payer: Self-pay | Admitting: Internal Medicine

## 2011-02-06 DIAGNOSIS — R51 Headache: Secondary | ICD-10-CM

## 2011-02-13 ENCOUNTER — Telehealth: Payer: Self-pay | Admitting: Family Medicine

## 2011-02-14 NOTE — Progress Notes (Signed)
Summary: Eye Pain  Phone Note Call from Patient Call back at [phone]   Caller: Patient Summary of Call: pt walked in stating that he has been having headaches on and off behind his left eye. Pt was offered an appt but stated " well can't they just call me something in". Please advise.[facility] [address] Initial call taken by: Lavell Islam,  February 06, 2011 11:44 AM  Follow-up for Phone Call        Pt coming in for OV today, Pt denies any blurred vision just pressure/pain behind left eye...Marland KitchenMarland KitchenFelecia Deloach CMA  February 06, 2011 12:06 PM

## 2011-02-14 NOTE — Assessment & Plan Note (Signed)
Summary: eye pain//fd   Vital Signs:  Patient profile:   77 year old male Weight:      195 pounds BMI:     31.59 Temp:     98.6 degrees F oral Pulse rate:   60 / minute Resp:     15 per minute BP sitting:   136 / 78  (left arm) Cuff size:   large  Vitals Entered By: Shonna Chock CMA (February 06, 2011 4:17 PM) CC: Left eye pain and pressure , Headaches   Primary Care Provider:  Laury Axon  CC:  Left eye pain and pressure  and Headaches.  History of Present Illness:    Onset of OS pain 02/04/2011;  he was told " tears build up because I don't blink enough". Dr Sherryle Lis last seen 18 months ago.  The patient reports L frontal  sinus pressure, but denies nausea, vomiting, sweats, nasal congestion, and photophobia.  The  pain  is described as constant and pressure-like.  High-risk features (red flags) include vision loss or change ("clouding of vision"),&  pain worse with exertion.This is not a new type of headache; he had it initially in  1987 "when I drank". He eats increased amounts of chocolate. The patient denies the following high-risk features: fever, neck pain/stiffness, rash, and trauma.  Prior treatment has included  ? Darvocet w/o benefit.   Current Medications (verified): 1)  Nexium 40 Mg Cpdr (Esomeprazole Magnesium) .Marland Kitchen.. 1 By Mouth Once Daily 2)  Uroxatral 10 Mg Tb24 (Alfuzosin Hcl) .... Take 1 Tablet By Mouth Once A Day 3)  Micardis 80 Mg Tabs (Telmisartan) .Marland Kitchen.. 1 By Mouth Two Times A Day 4)  Metformin Hcl 1000 Mg  Tabs (Metformin Hcl) .Marland Kitchen.. 1 By Mouth Two Times A Day 5)  Synthroid 75 Mcg  Tabs (Levothyroxine Sodium) .Marland Kitchen.. 1 By Mouth Once Daily 6)  Finasteride 5 Mg Tabs (Finasteride) .... One By Mouth Daily 7)  Lipitor 10 Mg Tabs (Atorvastatin Calcium) .... Take 1 By Mouth At Bedtime 8)  Hydroxyzine Hcl 50 Mg Tabs (Hydroxyzine Hcl) .Marland Kitchen.. 1 By Mouth Q6h As Needed Itching  Allergies: 1)  ! Prednisone (Prednisone) 2)  ! Verapamil Hcl Cr (Verapamil Hcl) 3)  ! Clonidine Hcl (Clonidine  Hcl) 4)  ! Atenolol (Atenolol) 5)  * Prednisone  Review of Systems General:  Denies chills. ENT:  Denies ear discharge, earache, and ringing in ears; No purulence. Neuro:  Denies brief paralysis, numbness, tingling, and weakness.  Physical Exam  General:  well-nourished,in no acute distress; alert,appropriate and cooperative throughout examination Head:  Normocephalic and atraumatic without obvious abnormalities.  Eyes:  No corneal or conjunctival inflammation noted. EOMI. Perrla. Field of  Vision grossly normal. Arcus senilis. Ptosis OD > OS. Vision normal. Ears:  External ear exam shows no significant lesions or deformities.  Otoscopic examination reveals clear canals, tympanic membranes are intact bilaterally without bulging, retraction, inflammation or discharge. Hearing is grossly normal bilaterally. Nose:  External nasal examination shows no deformity or inflammation. Nasal mucosa are dry without lesions or exudates. Mouth:  Oral mucosa and oropharynx without lesions or exudates.   Dentures Pulses:  R and L carotid  pulses are full and equal bilaterally; bruit R > L Neurologic:  cranial nerves II-XII intact, strength normal in all extremities, sensation intact to light touch, and DTRs symmetrical and normal.   Skin:  Intact without suspicious lesions or rashes Cervical Nodes:  No lymphadenopathy noted Axillary Nodes:  No palpable lymphadenopathy Psych:  memory intact for recent  and remote, flat affect, and subdued.     Impression & Recommendations:  Problem # 1:  HEADACHE (ICD-784.0)  ? Ophthalmic migraine  His updated medication list for this problem includes:    Tramadol Hcl 50 Mg Tabs (Tramadol hcl) .Marland Kitchen... 1/2 -1 every 6 hrs as needed for pain  Complete Medication List: 1)  Nexium 40 Mg Cpdr (Esomeprazole magnesium) .Marland Kitchen.. 1 by mouth once daily 2)  Uroxatral 10 Mg Tb24 (Alfuzosin hcl) .... Take 1 tablet by mouth once a day 3)  Micardis 80 Mg Tabs (Telmisartan) .Marland Kitchen.. 1 by  mouth two times a day 4)  Metformin Hcl 1000 Mg Tabs (Metformin hcl) .Marland Kitchen.. 1 by mouth two times a day 5)  Synthroid 75 Mcg Tabs (Levothyroxine sodium) .Marland Kitchen.. 1 by mouth once daily 6)  Finasteride 5 Mg Tabs (Finasteride) .... One by mouth daily 7)  Lipitor 10 Mg Tabs (Atorvastatin calcium) .... Take 1 by mouth at bedtime 8)  Hydroxyzine Hcl 50 Mg Tabs (Hydroxyzine hcl) .Marland Kitchen.. 1 by mouth q6h as needed itching 9)  Tramadol Hcl 50 Mg Tabs (Tramadol hcl) .... 1/2 -1 every 6 hrs as needed for pain  Patient Instructions: 1)  See Dr Sherryle Lis ; you're overdue for F/U.Keep  a Headache Diary as discussed. Prescriptions: TRAMADOL HCL 50 MG TABS (TRAMADOL HCL) 1/2 -1 every 6 hrs as needed for pain  #30 x 0   Entered and Authorized by:   Marga Melnick MD   Signed by:   Marga Melnick MD on 02/06/2011   Method used:   Electronically to        [facility] [address] (retail)       [phone]       [place], [place]  [address]       Ph: [phone]       Fax: [phone]   RxID:   [id number]    Orders Added: 1)  Est. Patient Level IV [01601]

## 2011-02-20 NOTE — Progress Notes (Signed)
Summary: Wants ABX for Sinus Inf  Phone Note Call from Patient   Caller: Patient Call For: Loreen Freud DO Summary of Call: pt came in on 02/06/11 and seen Dr.Hopper and was given an RX for Tramadol....Marland KitchenMarland KitchenHe stated he was having symptoms of a sinus infection and not given any meds. pt  is having  pain behind the left eye, headache, some sinus drainage.  Wants an ABX or something to help with the sinus issue.... Pt uses [facility] [address] c/b # [id number]...Marland KitchenMarland KitchenNKDA. Please advise  Initial call taken by: Almeta Monas CMA Duncan Dull),  February 13, 2011 4:58 PM  Follow-up for Phone Call        ceftin 500 1 by mouth two times a day for 10 days  Follow-up by: Loreen Freud DO,  February 13, 2011 5:05 PM  Additional Follow-up for Phone Call Additional follow up Details #1::        Discuss with patient, rx sent to pharmacy..........Marland KitchenFelecia Deloach CMA  February 13, 2011 5:11 PM     New/Updated Medications: CEFTIN 500 MG TABS (CEFUROXIME AXETIL) Take 1 by mouth two times a day for 10 days Prescriptions: CEFTIN 500 MG TABS (CEFUROXIME AXETIL) Take 1 by mouth two times a day for 10 days  #20 x 0   Entered by:   Jeremy Johann CMA   Authorized by:   Loreen Freud DO   Signed by:   Jeremy Johann CMA on 02/13/2011   Method used:   Faxed to ...       [facility] [address] (retail)       [phone]       [place], [place]  [address]       Ph: [phone]       Fax: [phone]   RxID:   [id number]

## 2011-03-01 DIAGNOSIS — Z0279 Encounter for issue of other medical certificate: Secondary | ICD-10-CM

## 2011-03-05 ENCOUNTER — Encounter: Payer: Self-pay | Admitting: Family Medicine

## 2011-03-11 LAB — POCT CARDIAC MARKERS
CKMB, poc: <1 ng/mL — ABNORMAL LOW (ref 1.0–8.0)
Myoglobin, poc: 43.3 ng/mL (ref 12–200)
Myoglobin, poc: 44 ng/mL (ref 12–200)
Troponin i, poc: <0.05 ng/mL (ref 0.00–0.09)

## 2011-03-11 LAB — POCT I-STAT, CHEM 8
BUN: 11 mg/dL (ref 6–23)
Creatinine, Ser: 0.6 mg/dL (ref 0.4–1.5)
Potassium: 4.2 mEq/L (ref 3.5–5.1)
Sodium: 139 mEq/L (ref 135–145)
TCO2: 30 mmol/L (ref 0–100)

## 2011-03-11 LAB — PROTIME-INR: INR: 0.95 (ref 0.00–1.49)

## 2011-03-13 NOTE — Medication Information (Signed)
Summary: Order for Glucose Testing Supplies  Order for Glucose Testing Supplies   Imported By: Maryln Gottron 03/08/2011 11:20:35  _____________________________________________________________________  External Attachment:    Type:   Image     Comment:   External Document

## 2011-03-14 LAB — CBC
MCHC: 34.1 g/dL (ref 30.0–36.0)
Platelets: 285 10*3/uL (ref 150–400)
RBC: 3.72 MIL/uL — ABNORMAL LOW (ref 4.22–5.81)
WBC: 8.6 10*3/uL (ref 4.0–10.5)

## 2011-03-14 LAB — BASIC METABOLIC PANEL
BUN: 11 mg/dL (ref 6–23)
Calcium: 8.9 mg/dL (ref 8.4–10.5)
Creatinine, Ser: 0.9 mg/dL (ref 0.4–1.5)
GFR calc Af Amer: >60 mL/min (ref >60)

## 2011-03-14 LAB — URINE MICROSCOPIC-ADD ON

## 2011-03-14 LAB — DIFFERENTIAL
Basophils Relative: 1 % (ref 0–1)
Lymphs Abs: 1.7 10*3/uL (ref 0.7–4.0)
Monocytes Relative: 6 % (ref 3–12)
Neutro Abs: 6.1 10*3/uL (ref 1.7–7.7)
Neutrophils Relative %: 70 % (ref 43–77)

## 2011-03-14 LAB — URINALYSIS, ROUTINE W REFLEX MICROSCOPIC
Glucose, UA: NEGATIVE mg/dL
Hgb urine dipstick: NEGATIVE
Ketones, ur: 15 mg/dL — AB
Protein, ur: 30 mg/dL — AB

## 2011-03-25 DIAGNOSIS — Z8673 Personal history of transient ischemic attack (TIA), and cerebral infarction without residual deficits: Secondary | ICD-10-CM | POA: Insufficient documentation

## 2011-03-25 DIAGNOSIS — I639 Cerebral infarction, unspecified: Secondary | ICD-10-CM

## 2011-03-25 HISTORY — DX: Personal history of transient ischemic attack (TIA), and cerebral infarction without residual deficits: Z86.73

## 2011-03-25 HISTORY — DX: Cerebral infarction, unspecified: I63.9

## 2011-04-05 ENCOUNTER — Other Ambulatory Visit: Payer: Self-pay | Admitting: Internal Medicine

## 2011-04-19 ENCOUNTER — Encounter: Payer: Self-pay | Admitting: Family Medicine

## 2011-04-19 ENCOUNTER — Ambulatory Visit (INDEPENDENT_AMBULATORY_CARE_PROVIDER_SITE_OTHER): Admitting: Family Medicine

## 2011-04-19 VITALS — BP 108/60 | Temp 99.1°F | Wt 189.4 lb

## 2011-04-19 DIAGNOSIS — J329 Chronic sinusitis, unspecified: Secondary | ICD-10-CM

## 2011-04-19 MED ORDER — CEFUROXIME AXETIL 500 MG PO TABS: 500.0000 mg | ORAL_TABLET | Freq: Two times a day (BID) | ORAL | Status: AC

## 2011-04-19 NOTE — Patient Instructions (Signed)

## 2011-04-19 NOTE — Progress Notes (Signed)
  Subjective:     Douglas Grant is a 77 y.o. male who presents for evaluation of symptoms of a URI, possible sinusitis. Symptoms include bilateral ear pressure/pain, congestion, facial pain, headache described as on top of head, nasal congestion, productive cough with  green colored sputum and sinus pressure. Onset of symptoms was 3 days ago, and has been gradually worsening since that time. Treatment to date: antihistamines and cough suppressants.  The following portions of the patient's history were reviewed and updated as appropriate: allergies, current medications, past family history, past medical history, past social history, past surgical history and problem list.  Review of Systems Pertinent items are noted in HPI.   Objective:    BP 108/60  Temp(Src) 99.1 F (37.3 C) (Oral)  Wt 189 lb 6.4 oz (85.911 kg) General appearance: alert, cooperative, appears stated age and no distress Head: Normocephalic, without obvious abnormality, atraumatic Ears: + dull TM b/l , no errythema Nose: moderate congestion, turbinates red, swollen, sinus tenderness bilateral,  Throat: abnormal findings: marked oropharyngeal erythema and pnd Neck: no adenopathy, supple, symmetrical, trachea midline and thyroid not enlarged, symmetric, no tenderness/mass/nodules Lungs: clear to auscultation bilaterally Heart: regular rate and rhythm, S1, S2 normal, no murmur, click, rub or gallop Extremities: extremities normal, atraumatic, no cyanosis or edema,  Good strength low ext Lymph nodes: Cervical adenopathy: ant and b/l   Assessment:   sinusitis  Hip pain---resolved Plan:  abx--ceftin veramyst and astepro  Discussed diagnosis and treatment of URI. Suggested symptomatic OTC remedies. Nasal saline spray for congestion. Follow up as needed.

## 2011-04-19 NOTE — Assessment & Plan Note (Signed)
veramyst and astepro ceftin rto prn

## 2011-04-20 ENCOUNTER — Emergency Department (HOSPITAL_BASED_OUTPATIENT_CLINIC_OR_DEPARTMENT_OTHER)
Admission: EM | Admit: 2011-04-20 | Discharge: 2011-04-20 | Disposition: A | Discharge status: Nursing Home (Includes ICF) | Payer: Medicare Other | Source: Home / Self Care | Attending: Emergency Medicine | Admitting: Emergency Medicine

## 2011-04-20 ENCOUNTER — Observation Stay (HOSPITAL_COMMUNITY)
Admission: EM | Admit: 2011-04-20 | Discharge: 2011-04-22 | Disposition: A | Discharge status: Home / Self Care | Payer: Medicare Other | Source: Other Acute Inpatient Hospital | Attending: Internal Medicine | Admitting: Internal Medicine

## 2011-04-20 ENCOUNTER — Emergency Department (INDEPENDENT_AMBULATORY_CARE_PROVIDER_SITE_OTHER): Payer: Medicare Other

## 2011-04-20 DIAGNOSIS — G319 Degenerative disease of nervous system, unspecified: Secondary | ICD-10-CM | POA: Insufficient documentation

## 2011-04-20 DIAGNOSIS — N400 Enlarged prostate without lower urinary tract symptoms: Secondary | ICD-10-CM | POA: Insufficient documentation

## 2011-04-20 DIAGNOSIS — E119 Type 2 diabetes mellitus without complications: Secondary | ICD-10-CM | POA: Insufficient documentation

## 2011-04-20 DIAGNOSIS — R5381 Other malaise: Secondary | ICD-10-CM | POA: Insufficient documentation

## 2011-04-20 DIAGNOSIS — Z79899 Other long term (current) drug therapy: Secondary | ICD-10-CM | POA: Insufficient documentation

## 2011-04-20 DIAGNOSIS — W19XXXA Unspecified fall, initial encounter: Secondary | ICD-10-CM

## 2011-04-20 DIAGNOSIS — R29898 Other symptoms and signs involving the musculoskeletal system: Secondary | ICD-10-CM

## 2011-04-20 DIAGNOSIS — D509 Iron deficiency anemia, unspecified: Secondary | ICD-10-CM | POA: Insufficient documentation

## 2011-04-20 DIAGNOSIS — I10 Essential (primary) hypertension: Secondary | ICD-10-CM | POA: Insufficient documentation

## 2011-04-20 DIAGNOSIS — I498 Other specified cardiac arrhythmias: Secondary | ICD-10-CM | POA: Insufficient documentation

## 2011-04-20 DIAGNOSIS — E039 Hypothyroidism, unspecified: Secondary | ICD-10-CM | POA: Insufficient documentation

## 2011-04-20 DIAGNOSIS — R079 Chest pain, unspecified: Secondary | ICD-10-CM

## 2011-04-20 DIAGNOSIS — M79609 Pain in unspecified limb: Secondary | ICD-10-CM | POA: Insufficient documentation

## 2011-04-20 DIAGNOSIS — K219 Gastro-esophageal reflux disease without esophagitis: Secondary | ICD-10-CM | POA: Insufficient documentation

## 2011-04-20 DIAGNOSIS — I6350 Cerebral infarction due to unspecified occlusion or stenosis of unspecified cerebral artery: Principal | ICD-10-CM | POA: Insufficient documentation

## 2011-04-20 DIAGNOSIS — D7589 Other specified diseases of blood and blood-forming organs: Secondary | ICD-10-CM | POA: Insufficient documentation

## 2011-04-20 DIAGNOSIS — D518 Other vitamin B12 deficiency anemias: Secondary | ICD-10-CM | POA: Insufficient documentation

## 2011-04-20 DIAGNOSIS — G459 Transient cerebral ischemic attack, unspecified: Secondary | ICD-10-CM | POA: Insufficient documentation

## 2011-04-20 DIAGNOSIS — E785 Hyperlipidemia, unspecified: Secondary | ICD-10-CM | POA: Insufficient documentation

## 2011-04-20 DIAGNOSIS — R5383 Other fatigue: Secondary | ICD-10-CM | POA: Insufficient documentation

## 2011-04-20 LAB — CBC
HCT: 32.5 % — ABNORMAL LOW (ref 39.0–52.0)
Hemoglobin: 11.1 g/dL — ABNORMAL LOW (ref 13.0–17.0)
MCHC: 34.2 g/dL (ref 30.0–36.0)
RBC: 2.97 MIL/uL — ABNORMAL LOW (ref 4.22–5.81)
WBC: 6.7 10*3/uL (ref 4.0–10.5)

## 2011-04-20 LAB — COMPREHENSIVE METABOLIC PANEL
ALT: 9 U/L (ref 0–53)
AST: 14 U/L (ref 0–37)
CO2: 29 mEq/L (ref 19–32)
Calcium: 8.8 mg/dL (ref 8.4–10.5)
Creatinine, Ser: 1 mg/dL (ref 0.4–1.5)
GFR calc Af Amer: >60 mL/min (ref >60)
GFR calc non Af Amer: >60 mL/min (ref >60)
Sodium: 141 mEq/L (ref 135–145)
Total Protein: 6.5 g/dL (ref 6.0–8.3)

## 2011-04-20 LAB — URINALYSIS, ROUTINE W REFLEX MICROSCOPIC
Glucose, UA: NEGATIVE mg/dL
Hgb urine dipstick: NEGATIVE
Specific Gravity, Urine: 1.021 (ref 1.005–1.030)
pH: 5.5 (ref 5.0–8.0)

## 2011-04-20 LAB — DIFFERENTIAL
Basophils Absolute: 0 10*3/uL (ref 0.0–0.1)
Basophils Relative: 0 % (ref 0–1)
Lymphocytes Relative: 28 % (ref 12–46)
Monocytes Absolute: 0.5 10*3/uL (ref 0.1–1.0)
Neutro Abs: 3.5 10*3/uL (ref 1.7–7.7)
Neutrophils Relative %: 53 % (ref 43–77)

## 2011-04-20 LAB — APTT: aPTT: 30 seconds (ref 24–37)

## 2011-04-20 LAB — PROTIME-INR
INR: 1.04 (ref 0.00–1.49)
Prothrombin Time: 13.8 seconds (ref 11.6–15.2)

## 2011-04-21 ENCOUNTER — Inpatient Hospital Stay (HOSPITAL_COMMUNITY): Payer: Medicare Other

## 2011-04-21 LAB — IRON AND TIBC
Saturation Ratios: 11 % — ABNORMAL LOW (ref 20–55)
UIBC: 203 ug/dL

## 2011-04-21 LAB — LIPID PANEL
HDL: 32 mg/dL — ABNORMAL LOW (ref >39)
Total CHOL/HDL Ratio: 3 RATIO
VLDL: 11 mg/dL (ref 0–40)

## 2011-04-21 LAB — GLUCOSE, CAPILLARY
Glucose-Capillary: 89 mg/dL (ref 70–99)
Glucose-Capillary: 112 mg/dL — ABNORMAL HIGH (ref 70–99)

## 2011-04-21 LAB — URINALYSIS, ROUTINE W REFLEX MICROSCOPIC
Bilirubin Urine: NEGATIVE
Hgb urine dipstick: NEGATIVE
Specific Gravity, Urine: 1.014 (ref 1.005–1.030)
Urobilinogen, UA: 1 mg/dL (ref 0.0–1.0)

## 2011-04-21 LAB — PROTEIN, URINE, RANDOM: Total Protein, Urine: 12 mg/dL

## 2011-04-21 LAB — CARDIAC PANEL(CRET KIN+CKTOT+MB+TROPI)
CK, MB: 0.9 ng/mL (ref 0.3–4.0)
Relative Index: INVALID (ref 0.0–2.5)
Total CK: 47 U/L (ref 7–232)

## 2011-04-21 LAB — COMPREHENSIVE METABOLIC PANEL
ALT: 9 U/L (ref 0–53)
AST: 12 U/L (ref 0–37)
Albumin: 3.1 g/dL — ABNORMAL LOW (ref 3.5–5.2)
CO2: 30 mEq/L (ref 19–32)
Calcium: 8.6 mg/dL (ref 8.4–10.5)
GFR calc Af Amer: >60 mL/min (ref >60)
Sodium: 137 mEq/L (ref 135–145)
Total Protein: 5.4 g/dL — ABNORMAL LOW (ref 6.0–8.3)

## 2011-04-21 LAB — FOLATE: Folate: 11.1 ng/mL

## 2011-04-21 LAB — CBC
HCT: 30.9 % — ABNORMAL LOW (ref 39.0–52.0)
Hemoglobin: 10.4 g/dL — ABNORMAL LOW (ref 13.0–17.0)
MCHC: 33.7 g/dL (ref 30.0–36.0)
MCV: 110.4 fL — ABNORMAL HIGH (ref 78.0–100.0)
RDW: 14.1 % (ref 11.5–15.5)

## 2011-04-21 LAB — MAGNESIUM: Magnesium: 1.6 mg/dL (ref 1.5–2.5)

## 2011-04-21 LAB — PROTIME-INR: INR: 1.18 (ref 0.00–1.49)

## 2011-04-21 LAB — VITAMIN B12: Vitamin B-12: 87 pg/mL — ABNORMAL LOW (ref 211–911)

## 2011-04-22 DIAGNOSIS — I4949 Other premature depolarization: Secondary | ICD-10-CM

## 2011-04-22 DIAGNOSIS — I059 Rheumatic mitral valve disease, unspecified: Secondary | ICD-10-CM

## 2011-04-22 LAB — URINE CULTURE: Culture  Setup Time: 201204271856

## 2011-04-22 LAB — CBC
HCT: 31.1 % — ABNORMAL LOW (ref 39.0–52.0)
MCH: 37.4 pg — ABNORMAL HIGH (ref 26.0–34.0)
MCHC: 33.8 g/dL (ref 30.0–36.0)
RDW: 14 % (ref 11.5–15.5)

## 2011-04-22 LAB — BASIC METABOLIC PANEL
Calcium: 8.7 mg/dL (ref 8.4–10.5)
Creatinine, Ser: 0.8 mg/dL (ref 0.4–1.5)
GFR calc non Af Amer: >60 mL/min (ref >60)
Glucose, Bld: 91 mg/dL (ref 70–99)
Sodium: 138 mEq/L (ref 135–145)

## 2011-04-23 ENCOUNTER — Ambulatory Visit (INDEPENDENT_AMBULATORY_CARE_PROVIDER_SITE_OTHER): Payer: Medicare Other

## 2011-04-23 ENCOUNTER — Telehealth: Payer: Self-pay | Admitting: Family Medicine

## 2011-04-23 DIAGNOSIS — E538 Deficiency of other specified B group vitamins: Secondary | ICD-10-CM

## 2011-04-23 LAB — GLUCOSE, CAPILLARY

## 2011-04-23 NOTE — Telephone Encounter (Signed)
Pt came in stating that he needs a note for work. 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

## 2011-04-23 NOTE — Telephone Encounter (Signed)
Spoke with patient and he had a stroke yesterday---schedule and appt for a follow up in the morning. He will need to be seen before we send him back to work      [employer]

## 2011-04-24 ENCOUNTER — Encounter: Payer: Self-pay | Admitting: Family Medicine

## 2011-04-24 ENCOUNTER — Ambulatory Visit (INDEPENDENT_AMBULATORY_CARE_PROVIDER_SITE_OTHER): Payer: Medicare Other | Admitting: Family Medicine

## 2011-04-24 DIAGNOSIS — I6350 Cerebral infarction due to unspecified occlusion or stenosis of unspecified cerebral artery: Secondary | ICD-10-CM

## 2011-04-24 DIAGNOSIS — E538 Deficiency of other specified B group vitamins: Secondary | ICD-10-CM

## 2011-04-24 DIAGNOSIS — E785 Hyperlipidemia, unspecified: Secondary | ICD-10-CM

## 2011-04-24 DIAGNOSIS — I10 Essential (primary) hypertension: Secondary | ICD-10-CM

## 2011-04-24 DIAGNOSIS — I639 Cerebral infarction, unspecified: Secondary | ICD-10-CM

## 2011-04-24 HISTORY — DX: Deficiency of other specified B group vitamins: E53.8

## 2011-04-24 LAB — PROTEIN ELECTROPH W RFLX QUANT IMMUNOGLOBULINS
Albumin ELP: 58.2 % (ref 55.8–66.1)
Alpha-1-Globulin: 5.8 % — ABNORMAL HIGH (ref 2.9–4.9)
Beta Globulin: 6.2 % (ref 4.7–7.2)
Total Protein ELP: 5.3 g/dL — ABNORMAL LOW (ref 6.0–8.3)

## 2011-04-24 MED ORDER — CYANOCOBALAMIN 1000 MCG/ML IJ SOLN
1000.0000 ug | Freq: Once | INTRAMUSCULAR | Status: AC
  Administered 2011-04-24: 1000 ug via INTRAMUSCULAR

## 2011-04-24 MED ORDER — CYANOCOBALAMIN 1000 MCG/ML IJ SOLN
1000.0000 ug | Freq: Once | INTRAMUSCULAR | Status: AC
  Administered 2011-04-23: 1000 ug via INTRAMUSCULAR

## 2011-04-24 NOTE — Patient Instructions (Signed)
Stroke (Cerebrovascular Accident) A stroke is the sudden death of brain tissue. It is a medical emergency. A stroke can cause permanent loss of brain function. If the symptoms of a stroke end without complications in 24 hours, it is diagnosed as a transient ischemic attack (TIA). If the symptoms are not resolved within 24 hours, it is defined as a stroke. CAUSES A stroke is caused by a decrease of oxygen supply to an area of your brain. It is usually the result of a small blood clot or the arteries hardening. A stroke can also be caused by blocked or damaged carotid arteries. Bleeding in the brain can cause, or accompany, a stroke. RISK FACTORS  High blood pressure (hypertension).  High cholesterol.   Diabetes.   Heart disease.   The buildup of fatty deposits in the blood vessels (peripheral artery disease or atherosclerosis).   An abnormal heart rhythm (atrial fibrillation).   Obesity.   Smoking.   Taking oral contraceptives (especially in combination with smoking).   Physical inactivity.   A diet high in fats, salt (sodium), and calories.  Alcohol use.   Use of illegal drugs (especially cocaine and methamphetamine).   Being a male.   Being an African American.   Age over 55.   Family history of stroke.   Previous history of blood clots, a "warning stroke" (transient ischemic attack, TIA), or heart attack.   Sickle cell disease.   SYMPTOMS These symptoms usually develop suddenly (or may be newly present upon awakening from sleep):  Sudden weakness or numbness of the face, arm, or leg, especially on one side of the body.  Sudden confusion.   Trouble speaking (aphasia) or understanding.   Sudden trouble seeing in one or both eyes.   Sudden trouble walking.  Dizziness.   Loss of balance or coordination.   Sudden severe headache with no known cause.   DIAGNOSIS Your caregiver can often determine the presence or absence of a stroke based on your symptoms,  history, and examination. A computerized X-ray scan (CT or CAT scan) of the brain is usually performed to confirm the stroke, to look for causes, and to determine the severity. Other tests may be done to find the cause of the stroke. These tests may include:  An EKG and heart monitoring.   An ultrasound evaluation of the heart (echocardiogram).   An ultrasound evaluation of your carotid arteries.   A computerized magnetic scan (MRI).   A scan of the brain circulation.   Blood oxygen level monitoring.   Blood tests.  PREVENTION The risk of a stroke can be decreased by appropriately treating high blood pressure, high cholesterol, diabetes, heart disease, and obesity and by quitting smoking, limiting alcohol, and staying physically active. TREATMENT TIME IS OF THE ESSENCE! Medicines to dissolve a blood clot can only be used within 4 1/2 hours of the onset of symptoms. After the 4 1/2 hour window has passed, treatment may include rest, oxygen, intravenous (IV) fluids, and medicines to thin the blood (to prevent another stroke). Treatment of stroke depends on the duration, severity, and cause of your symptoms. Medicines and diet may be used to address diabetes, high blood pressure, and other risk factors. Physical, speech, and occupational therapists will assess you and work to improve any functions impaired by the stroke. Measures will be taken to prevent short-term and long-term complications, including infection from breathing foreign material into the lungs (aspiration pneumonia), blood clots in the legs, bedsores, and falls. Rarely, surgery may   be needed to remove large blood clots or to open up blocked arteries. HOME CARE INSTRUCTIONS  Medicines: Aspirin and blood thinners may be used to prevent another stroke. Blood thinners need to be used exactly as instructed. Medicines may also be used to control risk factors for a stroke. Be sure you understand all your medicine instructions.   Diet:  Certain diets may be prescribed to address high blood pressure, high cholesterol, diabetes, or obesity.   A low salt (sodium), low saturated fat, low trans fat, low cholesterol diet is recommended to manage high blood pressure.   A low saturated fat, low trans fat, low cholesterol, and high fiber diet may control cholesterol levels.   A controlled carbohydrate, controlled sugar diet is recommended to manage diabetes.   A reduced calorie, low sodium, low saturated fat, low trans fat, low cholesterol diet is recommended to manage obesity.  A diet that includes 5 or more servings of fruits and vegetables a day may reduce the risk of stroke. Foods  may need to be a special consistency (soft or pureed), or small bites may need to be taken in order to avoid  aspirating or choking.  Maintain a healthy weight.   Stay physically active. It is recommended that you get at least 30 minutes of activity on most or all days.   Do not smoke.   Limit alcohol use. Moderate alcohol use is considered to be:   No more than 2 drinks per day for men.   No more than 1 drink per day for nonpregnant women.   Stop drug abuse.   Home safety: A safe home environment is important to reduce the risk of falls. Your caregiver may arrange for specialists to evaluate your home. Having grab bars in the bedroom and bathroom is often important. Your caregiver may arrange for special equipment to be used at home, such as raised toilets and a seat for the shower.   Physical, occupational, and speech therapy: Ongoing therapy may be needed to maximize your recovery after a stroke. If you have been advised to use a walker or a cane, use it at all times. Be sure to keep your therapy appointments.   Follow all instructions for follow-up with your caregiver. This is VERY important. This includes any referrals, physical therapy, rehabilitation, and laboratory tests. Proper treatment also prevents another stroke from occurring.    SEEK IMMEDIATE MEDICAL CARE IF:  You have sudden weakness or numbness of the face, arm, or leg, especially on one side of the body.   You have sudden confusion.   You have trouble speaking (aphasia) or understanding.   You have sudden trouble seeing in one or both eyes.   You have sudden trouble walking.   You have dizziness.   You have a loss of balance or coordination.   You have a sudden severe headache with no known cause.   You have an oral temperature above 100.4, not controlled by medicine.   You are coughing or have difficulty breathing.   You have new chest pain, angina, or an irregular heartbeat.  ANY OF THESE SYMPTOMS MAY REPRESENT A SERIOUS PROBLEM THAT IS AN EMERGENCY. Do not wait to see if the symptoms will go away. Get medical help right away. Call 911 (911 in [place] DO NOT drive yourself to the hospital. Document Released: 12/10/2005 Document Re-Released: 05/30/2010 [employer] Patient Information 2011 [employer]. 

## 2011-04-24 NOTE — Assessment & Plan Note (Signed)
Stable con't meds Recheck labs in July

## 2011-04-24 NOTE — Assessment & Plan Note (Signed)
con't meds stable 

## 2011-04-24 NOTE — Progress Notes (Signed)
  Subjective:    Patient ID: Douglas Grant, male    DOB: 09/19/1930, 77 y.o.   MRN: [id number]  HPI Pt here for f/u hospital from stroke.  Pt states he was at grocery last Friday and when he stepped up on curb his L leg gave out on him.  Pt went to hospital after a nurse at store saw what happened.  Pt was admitted with acute infarct in right lateral thalamus , post in int capsule. Marland Kitchen  He also had bigeminy on monitor.   Pt states he is completely better.  Has no lingering effects of stroke.  No CP, SOB, weakness. Pt was also found to have low b12--he was told to have b12 injections qd for 1 week then 1 a week for 1-2 months then monthly.      Review of Systems As above    Objective:   Physical Exam  Constitutional: He is oriented to person, place, and time. He appears well-developed and well-nourished.  Cardiovascular: Normal rate and regular rhythm.   Pulmonary/Chest: Effort normal.  Abdominal: Soft.  Musculoskeletal: Normal range of motion.  Neurological: He is alert and oriented to person, place, and time. He has normal strength and normal reflexes. He displays normal reflexes. No cranial nerve deficit. He exhibits normal muscle tone.          Assessment & Plan:

## 2011-04-24 NOTE — Assessment & Plan Note (Signed)
Completely resolved con't aspirin

## 2011-04-24 NOTE — Assessment & Plan Note (Signed)
b12 injection daily for 3 more days after today and then weekly for 1-2 months,  Then monthly Recheck b12 level in 1 month

## 2011-04-25 ENCOUNTER — Ambulatory Visit (INDEPENDENT_AMBULATORY_CARE_PROVIDER_SITE_OTHER): Payer: Medicare Other

## 2011-04-25 DIAGNOSIS — E538 Deficiency of other specified B group vitamins: Secondary | ICD-10-CM

## 2011-04-25 MED ORDER — CYANOCOBALAMIN 1000 MCG/ML IJ SOLN
1000.0000 ug | Freq: Once | INTRAMUSCULAR | Status: AC
  Administered 2011-04-25: 1000 ug via INTRAMUSCULAR

## 2011-04-26 ENCOUNTER — Ambulatory Visit (INDEPENDENT_AMBULATORY_CARE_PROVIDER_SITE_OTHER): Payer: Medicare Other | Admitting: *Deleted

## 2011-04-26 DIAGNOSIS — E538 Deficiency of other specified B group vitamins: Secondary | ICD-10-CM

## 2011-04-26 MED ORDER — CYANOCOBALAMIN 1000 MCG/ML IJ SOLN
1000.0000 ug | Freq: Once | INTRAMUSCULAR | Status: AC
  Administered 2011-04-26: 1000 ug via INTRAMUSCULAR

## 2011-04-26 NOTE — Consult Note (Signed)
Douglas, Grant NO.:  [id number]  MEDICAL RECORD NO.:  [id number]           PATIENT TYPE:  I  LOCATION:  3016                         FACILITY:  [facility]  PHYSICIAN:  Mansfield Dann M. [place], M.D.  DATE OF BIRTH:  15-Sep-1930  DATE OF CONSULTATION:  04/22/2011 DATE OF DISCHARGE:  04/22/2011                                CONSULTATION   HISTORY OF PRESENT ILLNESS:  Douglas Grant is a very pleasant 77 year old white male, who I am asked to see for evaluation of PVCs at the request of the hospitalist service.  The patient was admitted with a right thalamic stroke.  He has a history of hypertension, hyperlipidemia, and diabetes mellitus.  During his hospitalization here, his monitors demonstrated frequent PVCs with periods of bigeminy and few couplets. He has one 5 to 6 beat run of accelerated idioventricular rhythm.  These have all been asymptomatic.  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  He has never had any palpitations, dizziness, or syncope.  He denies any chest pain or shortness of breath.  He has had a stress test in the past, but cannot remember how long ago this was.  He denies any orthopnea, PND, or edema.  PAST MEDICAL HISTORY: 1. Diabetes mellitus type 2. 2. Hypertension. 3. Hyperlipidemia. 4. Hypothyroidism. 5. GERD. 6. BPH. 7. Sinusitis.  PRIOR SURGERIES:  Include, appendectomy and hernia repair.  CURRENT MEDICATIONS:  Include: 1. Aspirin 325 mg daily. 2. Insulin on sliding scale. 3. Vantin 400 mg daily. 4. Protonix 80 mg daily. 5. Uroxatral 10 mg daily. 6. Benicar 40 mg daily. 7. Synthroid 75 mcg daily. 8. Crestor 20 mg daily. 9. Proscar 5 mg daily.  ALLERGIES:  He has no known allergies.  SOCIAL HISTORY:  He works part-time for the [employer].  He states he is very busy with work at home.  He is married and has two pleasant daughters.  He  denies tobacco or alcohol use.  He quit smoking in 1980.  FAMILY HISTORY:  There is no family history of heart disease or stroke.  REVIEW OF SYSTEMS:  He has had some left leg weakness related to his recent stroke.  All other systems were reviewed and are negative.  PHYSICAL EXAMINATION:  GENERAL:  The patient is a pleasant white male, in no acute distress. VITAL SIGNS:  His pulse rate is 75 and irregular.  He is afebrile. Blood pressure is 118/77, oxygen saturation is 92% on room air. HEENT:  He is normocephalic, atraumatic.  His pupils are equal round, reactive to light, and accommodation.  Sclerae are clear.  Oropharynx is clear. NECK:  Supple without JVD, adenopathy, thyromegaly, or bruits. LUNGS:  Clear. CARDIAC:  Reveals a regular rate and rhythm without gallop, murmur, or click. ABDOMEN:  Soft and nontender and positive bowel sounds.  There were no masses or hepatosplenomegaly. EXTREMITIES:  Femoral and pedal pulses are 2+ and symmetric.  He has no edema. SKIN:  Warm and dry. NEUROLOGIC:  He is alert and oriented x3.  Cranial  nerves II-XII are intact.  Motor strength appears good at this time.  LABORATORY DATA:  ECG shows normal sinus rhythm with PVCs.  Otherwise, it is normal ECG.  Cranial MRI shows normal angiography, on MRA and MRI showed a subacute infarct in the right lateral thalamus and posterior limb of the internal capsule.  Sodium 138, potassium 4.3, chloride 102, CO2 30, glucose 91, BUN 9, creatinine 0.8, hemoglobin 10.5, hematocrit 31.1, platelets 200,000.  White count 5200.  TSH is 2.023, magnesium level was 1.6 and has since been repleted further.  Total cholesterol is 95, triglycerides 54, HDL 32, LDL 52.  IMPRESSION: 1. Premature ventricular contractions, chronic induration by patient     history.  He is completely asymptomatic. 2. Right thalamic stroke. 3. Diabetes mellitus. 4. Hyperlipidemia. 5. Hypertension.  PLAN:  We will review the results of  his echocardiogram.  If his LV function is normal, then I do not feel any further evaluation or treatment is needed at this time for his PVCs since they are asymptomatic.  From a cardiac standpoint, he would be stable for discharge today.          ______________________________ Kayia Billinger M. [place], M.D.     PMJ/MEDQ  D:  04/22/2011  T:  04/22/2011  Job:  [id number]  cc:   Hartley Barefoot, MD Lelon Perla, DO  Electronically Signed by Collins Dimaria [place] M.D. on 04/26/2011 08:57:59 AM

## 2011-04-27 ENCOUNTER — Ambulatory Visit (INDEPENDENT_AMBULATORY_CARE_PROVIDER_SITE_OTHER): Payer: Medicare Other | Admitting: *Deleted

## 2011-04-27 DIAGNOSIS — E538 Deficiency of other specified B group vitamins: Secondary | ICD-10-CM

## 2011-04-27 MED ORDER — CYANOCOBALAMIN 1000 MCG/ML IJ SOLN
1000.0000 ug | Freq: Once | INTRAMUSCULAR | Status: AC
  Administered 2011-04-27: 1000 ug via INTRAMUSCULAR

## 2011-04-27 NOTE — Patient Instructions (Signed)
B12 injections weekly for 4 weeks then monthly. Check level prior to 5th injection. B12 266.2

## 2011-04-27 NOTE — Discharge Summary (Signed)
NAMENOSSON, WENDER                ACCOUNT NO.:  [id number]  MEDICAL RECORD NO.:  [id number]           PATIENT TYPE:  LOCATION:                                 FACILITY:  PHYSICIAN:  Hartley Barefoot, MD    DATE OF BIRTH:  02/27/1930  DATE OF ADMISSION:  04/22/2011 DATE OF DISCHARGE:  04/22/2011                              DISCHARGE SUMMARY   DISCHARGE DIAGNOSES: 1. Acute infarct within the right lateral thalamus, posteriorly in     internal capsule. 2. Anemia, B12 deficiency and iron deficiency anemia. 3. Hypertension. 4. Hypothyroidism. 5. Benign prostatic hypertrophy. 6. Bigeminies.  DISCHARGE MEDICATIONS: 1. Aspirin 81 mg p.o. daily. 2. B12, 1000 mcg intramuscular daily for 5 days. 3. Ferrous sulfate 325 one tablet twice daily. 4. Cefpodoxime 500 mg 1 tablet by mouth twice daily. 5. Claritin 1 tablet by mouth daily as needed. 6. Finasteride 5 mg 1 tablet by mouth daily. 7. Lipitor 40 mg p.o. daily. 8. Metformin 1000 mg p.o. twice daily. 9. Micardis 80 mg 1 tablet by mouth daily. 10.Nexium 40 mg 1 capsule by mouth daily. 11.Synthroid 75 mcg 1 tablet by mouth daily. 12.Tramadol 50 mg 0.5-1 tablet by mouth every 6 hours as needed. 13.Uroxatral 10 mg 1 tablet by mouth daily.  DISPOSITION AND FOLLOWUP:  Mr. Delmonaco will need to follow with his primary care physician within a week.  He will need B12 supplement.  He will need also to be referred to gastroenterologist for possible colonoscopy if he has not had one.  We need to follow compliance with aspirin for stroke prevention.  STUDIES PERFORMED: 1. CT head on 04-22-2011, showed age-related cerebral atrophy,     ventriculomegaly, and periventricular white matter disease.  No     acute intracranial finding or skull fracture.  Extensive paranasal     sinus disease. 2. Rib x-ray, negative left rib detail.  Mild cardiomegaly, no active     lung disease. 3. MRI, a subcentimeter acute infarction within the right  lateral     thalamus, posterior limb internal capsule.  No hemorrhage or     significant mass effect.  Extensive chronic-appearing small vessel     changes well throughout the cerebral hemisphere, white matter. 4. MRA of head, negative intracranial MR angiography of large and     medium-sized vessels. 5. A 2-D echo pending at this time. 6. Carotid Dopplers preliminary report no ICA stenosis.  Vertebral     artery flow is anterograde.  BRIEF HISTORY OF PRESENT ILLNESS:  This is a very pleasant 77 year old with past medical history of diabetes, hypertension, hyperlipidemia, hypothyroidism, recently diagnosed with sinusitis.  Apparently, he went to the grocery store, stepped out of his car, stepped down to the car when his legs felt stuck.  The patient presented to his primary care physician, Dr. Quintella Reichert who was not present and he was referred to the emergency department.  The patient had a CT head at [facility] that was negative.  The patient's symptoms has resolved to his arrival to the emergency department at [facility].  The patient was  admitted for TIA workup. 1. Acute infarct within the right lateral thalamus, posterior limb     internal capsule.  The patient was admitted to telemetry.  We     started him back on aspirin.  He was not taking aspirin due to     prior history of nosebleeds when he blows his nose, but no     significant bleed.  We will continue with his blood pressure     medications, Micardis.  He will need risk factor modification.     Preliminary report of carotid Dopplers showed no ICA stenosis.     Vertebral artery flow is anterograde.  A 2-D echo is pending at     this time.  On telemetry, he only had bigeminies.  Cardiology is     going to review his telemetry reading and if it is okay, he is     going to be discharged today. 2. Anemia, megaloblastic.  Iron deficiency and B12 deficiency anemia.     The patient was started on B12, 1000 mg for 7  days.  He will be     given a prescription for 5 more days.  He will need one injection     every week for at least a month or two.  He will need to be     referred for a colonoscopy if he has not had one.  His ferritin was     at 33, his iron was low at 26, total iron binding capacity 229,     folate 11, B12 was at 81.  He would need also an intrinsic factor     antibody. 3. Hypertension.  We will continue with Benicar. 4. Hypothyroidism.  We will continue with levothyroxine. 5. BPH.  Uroxatral. 6. Bigeminies, nonsustained V-tach.  A 2-D echo is pending at this     time.  Magnesium was repleted.  Cardiology is going to review     reading and 2-D echo and if it is okay, the patient will be     discharged today.  On the day of discharge, the patient was in improved condition.  No neuro deficit.  Blood pressure 118/77, respirations 18, pulse 62, temperature 97.9, sat 92 on room air.  The patient was discharged in improved condition.  Sodium 138, potassium 4.3, chloride 102, bicarb 30, BUN 9, creatinine 0.8, calcium 8.7.  Ferritin 33.  White blood cell 5.2, hemoglobin 10.5, platelet 200, hematocrit 31.1.     Hartley Barefoot, MD BR/MEDQ  D:  04/22/2011  T:  04/22/2011  Job:  [id number]  Electronically Signed by Hartley Barefoot MD on 04/27/2011 24:40:10 PM

## 2011-05-04 ENCOUNTER — Ambulatory Visit (INDEPENDENT_AMBULATORY_CARE_PROVIDER_SITE_OTHER): Payer: Medicare Other | Admitting: *Deleted

## 2011-05-04 DIAGNOSIS — E538 Deficiency of other specified B group vitamins: Secondary | ICD-10-CM

## 2011-05-04 MED ORDER — CYANOCOBALAMIN 1000 MCG/ML IJ SOLN
1000.0000 ug | Freq: Once | INTRAMUSCULAR | Status: AC
  Administered 2011-05-04: 1000 ug via INTRAMUSCULAR

## 2011-05-10 NOTE — H&P (Signed)
NAMETENOCH, MCCLURE NO.:  [id number]  MEDICAL RECORD NO.:  [id number]           PATIENT TYPE:  I  LOCATION:  3016                         FACILITY:  [facility]  PHYSICIAN:  Massie Maroon, MD        DATE OF BIRTH:  13-Aug-1930  DATE OF ADMISSION:  04/20/2011 DATE OF DISCHARGE:                             HISTORY & PHYSICAL   CHIEF COMPLAINT:  "My leg was weak."  HISTORY OF PRESENT ILLNESS:  A 77 year old male with a history of type 2 diabetes, hypertension, hyperlipidemia, hypothyroidism, recently diagnosed with sinusitis, apparently was gone to the grocery store, stepped out of his car, stepped down to the car when his legs felt stuck.  This was about 1 p.m.  The patient presented to his primary care's office Dr. Alwyn Ren who was not present and so he was sent to the emergency room for evaluation.  He went to the [facility] and had a CT of brain, which was negative for any acute process.  It showed age related atrophy, ventriculomegaly, and periventricular white matter disease.  The patient's symptoms have resolved at the present time. However, emergency room was concern for possible TIA and so he was sent to [facility] for further evaluation.  The patient is presently asymptomatic.  The patient will be started on aspirin.  PAST MEDICAL HISTORY: 1. Diabetes type 2. 2. Hypertension. 3. Hyperlipidemia. 4. Hypothyroidism. 5. GERD. 6. BPH. 7. Sinusitis.  PAST SURGICAL HISTORY: 1. Appendectomy. 2. Hernia repair.  SOCIAL HISTORY:  The patient does not smoke or drink.  He quit smoking in 1987.  FAMILY HISTORY:  There is no family history of stroke.  His mother died in his 49s of blood clot.  ALLERGIES:  PREDNISONE.  MEDICATIONS:  Please see MedRec. 1. Nexium 40 mg p.o. daily. 2. Uroxatral 10 mg p.o. daily. 3. Micardis 80 mg p.o. b.i.d. 4. Metformin 1000 mg p.o. b.i.d. 5. Synthroid 75 mcg p.o. daily. 6. Finasteride 5 mg p.o. daily. 7.  Lipitor 10 mg p.o. nightly. 8. Hydroxyzine 50 mg p.o. q.6 h. p.r.n. itch. 9. Tramadol 50 mg one half to one p.o. q.6 h. p.r.n. pain.  REVIEW OF SYSTEMS:  Negative for all 10-organ systems except for pertinent positives as stated above.  PHYSICAL EXAMINATION:  VITAL SIGNS:  Temperature 98.3, pulse 54, blood pressure 146/56, pulse ox 97% on room air. HEENT:  Anicteric. NECK:  No JVD, no bruit, no thyromegaly, no adenopathy. HEART:  Regular rate and rhythm.  S1-S2.  No murmurs, gallops, or rubs. LUNGS:  Clear to auscultation bilaterally. ABDOMEN:  Soft, nontender, and nondistended.  Positive bowel sounds. EXTREMITIES:  No cyanosis, clubbing, or edema. SKIN:  No rashes. LYMPH NODES:  No adenopathy. NEUROLOGIC:  Nonfocal, cranial nerves II through XII intact.  Reflex is 2+, symmetric, diffuse with downgoing toes bilaterally, motor strength 5/5 in all four extremities, pinprick intact.  Good finger-to-nose.  LABORATORY DATA:  Sodium 141, potassium 4.7, BUN 30, creatinine 1.0, AST 14, ALT 9, PTT 30, PT 13.8.  Urinalysis negative.  WBC 6.7, hemoglobin 11.1, MCV 109.4, platelet count 221.  ASSESSMENT  AND PLAN: 1. Left leg weakness, rule out transient ischemic attack.  Check an     MRI of brain, check a carotid ultrasound, Check a cardiac 2-D echo.     The patient will be started on aspirin and will continue on     Lipitor. 2. Macrocytosis:  Check a B12 and folic acid. 3. Anemia:  Check iron studies, B12, folate, TSH, SPEP, UPEP. 4. Hypothyroidism.  Check a TSH.  Continue Synthroid. 5. Diabetes type 2.  Continue metformin.  Check fingerstick blood     sugars a.c. and at bedtime.  NovoLog sensitive sliding scale with     nightly coverage. 6. Benign prostatic hypertrophy.  Continue Uroxatral.  Continue     finasteride. 7. Gastroesophageal reflux disease.  Continue Nexium. 8. Hypertension.  Continue Micardis. 9. Deep venous thrombosis prophylaxis.  SCDs.     Massie Maroon,  MD     JYK/MEDQ  D:  04/21/2011  T:  04/21/2011  Job:  [id number]  Electronically Signed by Pearson Grippe MD on 05/10/2011 06:57:22 AM

## 2011-05-11 ENCOUNTER — Ambulatory Visit (INDEPENDENT_AMBULATORY_CARE_PROVIDER_SITE_OTHER): Payer: Medicare Other | Admitting: *Deleted

## 2011-05-11 ENCOUNTER — Telehealth: Payer: Self-pay | Admitting: Family Medicine

## 2011-05-11 DIAGNOSIS — E538 Deficiency of other specified B group vitamins: Secondary | ICD-10-CM

## 2011-05-11 MED ORDER — FINASTERIDE 5 MG PO TABS: 5.0000 mg | ORAL_TABLET | Freq: Every day | ORAL | Status: DC

## 2011-05-11 MED ORDER — ESOMEPRAZOLE MAGNESIUM 40 MG PO CPDR: 40.0000 mg | DELAYED_RELEASE_CAPSULE | Freq: Every day | ORAL | Status: DC

## 2011-05-11 MED ORDER — METFORMIN HCL 1000 MG PO TABS: 1000.0000 mg | ORAL_TABLET | Freq: Two times a day (BID) | ORAL | Status: DC

## 2011-05-11 MED ORDER — CYANOCOBALAMIN 1000 MCG/ML IJ SOLN
1000.0000 ug | Freq: Once | INTRAMUSCULAR | Status: AC
  Administered 2011-05-11: 1000 ug via INTRAMUSCULAR

## 2011-05-11 MED ORDER — ATORVASTATIN CALCIUM 10 MG PO TABS: 10.0000 mg | ORAL_TABLET | Freq: Every day | ORAL | Status: DC

## 2011-05-11 MED ORDER — LEVOTHYROXINE SODIUM 75 MCG PO TABS: 75.0000 ug | ORAL_TABLET | Freq: Every day | ORAL | Status: DC

## 2011-05-11 NOTE — Discharge Summary (Signed)
. [facility]  Patient:    Douglas Grant, Douglas Grant                       MRN: [id number] Adm. Date:  [id number] Disc. Date: [id number] Attending:  Carrie Mew CC:         Stacie Glaze, M.D. [place], [facility]   Discharge Summary  ADMISSION DIAGNOSIS:  Chest pain, rule out myocardial infarction.  DISCHARGE DIAGNOSIS:  Chest pain, rule out myocardial infarction.  CONSULTATIONS:  None.  PROCEDURES: 1. Persantine Cardiolite study.  Preliminary report was negative study with no    abnormalities. 2. Two-dimensional echo, final report pending.  HISTORY OF PRESENT ILLNESS:  Douglas Grant is a 76 year old Caucasian male with a history of GERD, obesity, history of colon polyps.  He presented to the [facility] Emergency Department with the onset of substernal chest pain while at rest.  He rated this as a 4/10, which lasted several seconds and occurred 6-7 times over several hours.  Because of his concern for cardiac disease, he presented for evaluation.  He was admitted at that time for rule out MI. Please see Dr. Darryll Capers history and physical exam for complete details.  HOSPITAL COURSE:  The patient was admitted to a telemetry floor.  He had cardiac enzymes which returned as normal, with CKs being 145, 99, and 96, with MB fractions of 1.7, 1.0, and 1.1.  Troponin Is were 0.01 and 0.01.  The patient had no recurrent chest pain or chest discomfort.  He underwent a Persantine Cardiolite, as noted, with negative results.  A 2-D echo is pending.  LABORATORY DATA:  The patient had a comprehensive metabolic panel that was normal.  He had a CBC which was normal with a white count of 7200.  The patient had a lipid profile with a cholesterol of 146, HDL of 30, LDL was 95, triglycerides were 104.  Thyroid function revealed a TSH of 6.35, being slightly above normal.  With the patient having ruled out for MI by enzymes, having a negative Cardiolite study, with no  recurrent chest pain, he is felt to be stable and ready for discharge.  Probable diagnosis was exacerbation of GERD.  PHYSICAL EXAMINATION:  VITAL SIGNS:  The patient was afebrile, blood pressure was 120/70.  GENERAL:  Obese Caucasian male with no acute disease.  LUNGS:  Clear.  HEART:  Regular, with no murmurs, rubs, or gallops.  ABDOMEN:  Protuberant, soft, with no tenderness.  DISCHARGE MEDICATIONS:  The patient will be continued on all of his home medications, which include: 1. Aspirin daily. 2. Altace 5 mg daily. 3. Coreg 3.125 mg b.i.d. 4. Protonix 40 mg daily. 5. Restoril 15 mg q.h.s. for sleep.  DISPOSITION:  The patient is to be discharged home.  FOLLOW-UP:  The patient is to see Dr. Darryll Capers for routine follow-up.  DISCHARGE INSTRUCTIONS:  He should call the office if he has any recurrent discomfort.  CONDITION ON DISCHARGE:  Stable. DD:  01/25/01 TD:  01/27/01 Job: [id number]

## 2011-05-11 NOTE — Telephone Encounter (Signed)
Pt came in for a nurse visit and ask for scripts for metformin 1000mg ,synthroid 75mg ,fanasteride 5 mg, lipitor 10 mg, mexium 40 mg to take to the [place].

## 2011-05-11 NOTE — Op Note (Signed)
[place]. [facility]  Patient:    Douglas Grant, Douglas Grant Visit Number: [id number] MRN: [id number]          Service Type: SUR Location: 5700 [id number] Attending Physician:  Delsa Bern Dictated by:   Lorne Skeens. Hoxworth, M.D. Proc. Date: 01/11/02 Admit Date:  01/10/2002                             Operative Report  PREOPERATIVE DIAGNOSIS:  Acute appendicitis.  POSTOPERATIVE DIAGNOSIS:  Perforated appendicitis.  SURGICAL PROCEDURES:  Laparoscopy and open appendectomy.  SURGEON:  Lorne Skeens. Hoxworth, M.D.  ANESTHESIA:  General.  BRIEF HISTORY:  Douglas Grant is a 77 year old white male, who presented with two days of worsening right lower quadrant abdominal pain without any significant associated symptoms.  A CT scan was obtained in the emergency room, which revealed marked inflammatory change around the appendix.  He had marked right lower quadrant tenderness on examination.  Laparoscopic and possible open appendectomy have been recommended and accepted.  The nature of the procedure, its indications, and risks of bleeding and infection were discussed and understood preoperatively.  He is now brought to the operating room for this procedure.  DESCRIPTION OF PROCEDURE:  The patient was brought to the operating room, placed in the supine position on the operating table and general endotracheal anesthesia was induced.  A Foley catheter was placed.  He received preoperative broad-spectrum antibiotics.  The abdomen was widely prepped and draped.  Initially, a 1 cm incision was made at the umbilicus and dissection carried down to the midline fascia, which was sharply incised for 1 cm and the peritoneum entered under direct vision.  Through a mattress suture of zero Vicryl, the Hasson trocar was placed and pneumoperitoneum was established. Under direct vision, a 5 mm trocar was placed in the right upper quadrant and a 12 mm trocar in the left lower  quadrant.  The cecum was quite distended and had some inflammatory change.  The base of the appendix could be seen when retracting the cecum, and the appendix was in a retrocecal position.  There was purulence and marked edema and inflammatory change lateral and posterior to the cecum.  The appendix was quite fixed in position and I did attempt some careful blunt dissection laparoscopically, but it was clear that the appendix was markedly fixed posterior to the cecum, where it could not be visualized and I was unable to safely mobilize the cecum or the appendix laparoscopically.  I elected to convert to an open procedure.  The trocars were removed and the purse-string suture was secured at the umbilicus.  An oblique right lower quadrant incision was made.  The external oblique was divided along the line of its fibers.  The internal oblique was bluntly split and the peritoneum and transversus entered sharply under direct vision.  There was a moderate amount of pus in the right lower quadrant and this was cultured.  Using blunt dissection, the cecum and appendix were mobilized laterally.  There was marked inflammatory change with a great deal of edema around the lateral peritoneal attachments of the cecum and along the terminal ileum that made the dissection quite difficult.  The terminal ileum and cecum were mobilized dividing some lateral peritoneal attachments allowing more exposure of the retrocecal appendix, which was obviously acutely inflamed with purulence and with perforation about a third of the way down from the base. The tip was very difficult  to expose.  The base was clearly identified and I elected to remove it retrograde.  The mesoappendix was dissected away from the appendix at the base and a TA-30 stapler was used to fire across the base of the appendix well down on to the cecum.  Following this, this allowed easier medial retraction of the cecum and better exposure of the  appendix and mesoappendix laterally.  The mesoappendix was then sequentially divided between clamps and tied with 3-0 silk ties.  The lateral peritoneal attachments to the appendix were then more clearly delineated and these were divided between clamps and tied or cauterized and the specimen was removed. The right lower quadrant was then copiously irrigated with saline and hemostasis assured and the viscera returned to their anatomic position.  The transversus peritoneum, then the internal, then the external oblique were closed with running 0 PDS.  Subcutaneous tissue was irrigated and the wound was packed with moist saline gauze.  The laparoscopic incisions were closed with staples.  Sponge, needle, and instrument counts were correct.  Dry, sterile dressings were applied and the patient was taken to recovery in good condition. Dictated by:   Lorne Skeens. Hoxworth, M.D. Attending Physician:  Delsa Bern DD:  01/11/02 TD:  01/13/02 Job: [id number]

## 2011-05-11 NOTE — Telephone Encounter (Signed)
Rx printed and given to Pt 

## 2011-05-15 ENCOUNTER — Ambulatory Visit (INDEPENDENT_AMBULATORY_CARE_PROVIDER_SITE_OTHER): Payer: Medicare Other | Admitting: Family Medicine

## 2011-05-15 ENCOUNTER — Encounter: Payer: Self-pay | Admitting: Family Medicine

## 2011-05-15 VITALS — BP 118/58 | HR 96 | Temp 99.2°F | Wt 189.0 lb

## 2011-05-15 DIAGNOSIS — L247 Irritant contact dermatitis due to plants, except food: Secondary | ICD-10-CM

## 2011-05-15 DIAGNOSIS — L255 Unspecified contact dermatitis due to plants, except food: Secondary | ICD-10-CM

## 2011-05-15 MED ORDER — MOMETASONE FUROATE 0.1 % EX CREA: TOPICAL_CREAM | CUTANEOUS | Status: DC

## 2011-05-15 NOTE — Progress Notes (Signed)
  Subjective:     Douglas Grant is a 77 y.o. male who presents for evaluation of a rash involving the forearm. Rash started 4 days ago. Lesions are clear, and blistering in texture. Rash has not changed over time. Rash is pruritic. Associated symptoms: none. Patient denies: abdominal pain, arthralgia and fever. Patient has had contacts with similar rash. Patient has had new exposures (soaps, lotions, laundry detergents, foods, medications, plants, insects or animals).  The following portions of the patient's history were reviewed and updated as appropriate: allergies, current medications, past family history, past medical history, past social history, past surgical history and problem list.  Review of Systems Pertinent items are noted in HPI.    Objective:    BP 118/58  Pulse 96  Temp(Src) 99.2 F (37.3 C) (Oral)  Wt 189 lb (85.73 kg)  SpO2 96% General:  alert, cooperative, appears stated age and no distress  Skin:  vesicles noted on extremities     Assessment:    contact dermatitis: plants poison ivy    Plan:    Medications: topical steroid: elocon. f/u prn

## 2011-05-15 NOTE — Patient Instructions (Signed)
Poison Ivy (Toxicodendron Dermatitis) Poison ivy is a inflammation of the skin (contact dermatitis) caused by touching the allergens on the leaves of the ivy plant following previous exposure to the plant. The rash usually appears 48 hours after exposure. The rash is usually bumps (papules) or blisters (vesicles) in a linear pattern. Depending on your own sensitivity, the rash may simply cause redness and itching, or it may also progress to blisters which may break open. These must be well cared for to prevent secondary bacterial (germ) infection, followed by scarring. Keep any open areas dry, clean, dressed, and covered with an antibacterial ointment if needed. The eyes may also get puffy. The puffiness is worst in the morning and gets better as the day progresses. This dermatitis usually heals without scarring, within 2 to 3 weeks without treatment. HOME CARE INSTRUCTIONS Thoroughly wash with soap and water as soon as you have been exposed to poison ivy. You have about one half hour to remove the plant resin before it will cause the rash. This washing will destroy the oil or antigen on the skin that is causing, or will cause, the rash. Be sure to wash under your fingernails as any plant resin there will continue to spread the rash. Do not rub skin vigorously when washing affected area. Poison ivy cannot spread if no oil from the plant remains on your body. A rash that has progressed to weeping sores will not spread the rash unless you have not washed thoroughly. It is also important to wash any clothes you have been wearing as these may carry active allergens. The rash will return if you wear the unwashed clothing, even several days later. Avoidance of the plant in the future is the best measure. Poison ivy plant can be recognized by the number of leaves. Generally, poison ivy has three leaves with flowering branches on a single stem. Diphenhydramine may be purchased over the counter and used as needed for  itching. Do not drive with this medication if it makes you drowsy. Ask your caregiver about medication for children. SEEK MEDICAL CARE IF   Open sores develop.   Redness spreads beyond area of rash.   You notice purulent (pus-like) discharge.   You have increased pain.   Other signs of infection develop (such as fever).  Document Released: 12/07/2000 Document Re-Released: 11/28/2009 Exit[employer] Patient Information 2011 [employer]. 

## 2011-05-18 ENCOUNTER — Ambulatory Visit: Payer: Medicare Other

## 2011-05-19 ENCOUNTER — Ambulatory Visit (INDEPENDENT_AMBULATORY_CARE_PROVIDER_SITE_OTHER): Payer: Medicare Other | Admitting: Family Medicine

## 2011-05-19 ENCOUNTER — Encounter: Payer: Self-pay | Admitting: Family Medicine

## 2011-05-19 VITALS — BP 140/70 | HR 84 | Temp 97.4°F | Resp 16 | Wt 190.0 lb

## 2011-05-19 DIAGNOSIS — J069 Acute upper respiratory infection, unspecified: Secondary | ICD-10-CM

## 2011-05-19 DIAGNOSIS — E538 Deficiency of other specified B group vitamins: Secondary | ICD-10-CM

## 2011-05-19 MED ORDER — AMOXICILLIN 875 MG PO TABS: 875.0000 mg | ORAL_TABLET | Freq: Two times a day (BID) | ORAL | Status: AC

## 2011-05-19 NOTE — Patient Instructions (Signed)
Use nasal saline for the congestion, tylenol for the discomfort and start the antibiotics in a few days if your aren't improving.  If you have any weakness, speech changes, or numbness then go to the ER. Take care.

## 2011-05-19 NOTE — Assessment & Plan Note (Signed)
Possible early sinusitis.  Okay for outpatient fu.  Hold abx for now and start if sx persist.  Nasal saline in meantime and tylenol for pain.

## 2011-05-19 NOTE — Progress Notes (Signed)
duration of symptoms: 1-2 days rhinorrhea:some Congestion:yes, esp on R side ear pain:no sore throat:no cough:no Myalgias: no other concerns: R sided HA and sinus tenderness.  "this is like when I had a sinus infection before."  No fevers.    Recent CVA with total resolution of symptoms.  This isn't similar at all to that episode.   ROS: See HPI.  Otherwise negative.    Meds, vitals, and allergies reviewed.   GEN: nad, alert and oriented HEENT: mucous membranes moist, TM w/o erythema, nasal epithelium injected and stuffy R>L, OP with mild cobblestoning NECK: supple w/o LA CV: rrr. PULM: ctab, no inc wob ABD: soft, +bs EXT: no edema CN 2-12 wnl B, S/S/DTR wnl x4, cerebellar testing wnl, speech wnl and gait wnl

## 2011-05-22 ENCOUNTER — Telehealth: Payer: Self-pay | Admitting: Family Medicine

## 2011-05-23 MED ORDER — CYANOCOBALAMIN 1000 MCG/ML IJ SOLN
1000.0000 ug | Freq: Once | INTRAMUSCULAR | Status: AC
  Administered 2011-05-21: 1000 ug via INTRAMUSCULAR

## 2011-05-23 NOTE — Progress Notes (Signed)
Addended by: Merrilyn Puma on: 05/23/2011 03:50 PM   Modules accepted: Orders

## 2011-05-25 ENCOUNTER — Ambulatory Visit (INDEPENDENT_AMBULATORY_CARE_PROVIDER_SITE_OTHER): Payer: Medicare Other

## 2011-05-25 DIAGNOSIS — E538 Deficiency of other specified B group vitamins: Secondary | ICD-10-CM

## 2011-05-25 MED ORDER — CYANOCOBALAMIN 1000 MCG/ML IJ SOLN
1000.0000 ug | Freq: Once | INTRAMUSCULAR | Status: AC
  Administered 2011-05-25: 1000 ug via INTRAMUSCULAR

## 2011-05-28 NOTE — Telephone Encounter (Signed)
Done

## 2011-06-01 ENCOUNTER — Ambulatory Visit (INDEPENDENT_AMBULATORY_CARE_PROVIDER_SITE_OTHER): Payer: Medicare Other | Admitting: *Deleted

## 2011-06-01 DIAGNOSIS — E538 Deficiency of other specified B group vitamins: Secondary | ICD-10-CM

## 2011-06-01 MED ORDER — CYANOCOBALAMIN 1000 MCG/ML IJ SOLN
1000.0000 ug | Freq: Once | INTRAMUSCULAR | Status: AC
  Administered 2011-06-01: 1000 ug via INTRAMUSCULAR

## 2011-06-11 ENCOUNTER — Ambulatory Visit (INDEPENDENT_AMBULATORY_CARE_PROVIDER_SITE_OTHER): Payer: Medicare Other | Admitting: *Deleted

## 2011-06-11 DIAGNOSIS — E538 Deficiency of other specified B group vitamins: Secondary | ICD-10-CM

## 2011-06-11 MED ORDER — CYANOCOBALAMIN 1000 MCG/ML IJ SOLN
1000.0000 ug | Freq: Once | INTRAMUSCULAR | Status: AC
  Administered 2011-06-11: 1000 ug via INTRAMUSCULAR

## 2011-06-18 ENCOUNTER — Ambulatory Visit (INDEPENDENT_AMBULATORY_CARE_PROVIDER_SITE_OTHER): Payer: Medicare Other | Admitting: *Deleted

## 2011-06-18 DIAGNOSIS — E538 Deficiency of other specified B group vitamins: Secondary | ICD-10-CM

## 2011-06-18 LAB — VITAMIN B12: Vitamin B-12: 701 pg/mL (ref 211–911)

## 2011-06-18 MED ORDER — CYANOCOBALAMIN 1000 MCG/ML IJ SOLN
1000.0000 ug | Freq: Once | INTRAMUSCULAR | Status: AC
  Administered 2011-06-18: 1000 ug via INTRAMUSCULAR

## 2011-06-20 ENCOUNTER — Encounter: Payer: Self-pay | Admitting: *Deleted

## 2011-07-02 ENCOUNTER — Other Ambulatory Visit: Payer: Self-pay

## 2011-07-02 MED ORDER — ALFUZOSIN HCL ER 10 MG PO TB24: 10.0000 mg | ORAL_TABLET | Freq: Every day | ORAL | Status: DC

## 2011-07-02 MED ORDER — TELMISARTAN 80 MG PO TABS: 80.0000 mg | ORAL_TABLET | Freq: Two times a day (BID) | ORAL | Status: DC

## 2011-07-02 MED ORDER — METFORMIN HCL 1000 MG PO TABS: 1000.0000 mg | ORAL_TABLET | Freq: Two times a day (BID) | ORAL | Status: DC

## 2011-08-23 ENCOUNTER — Other Ambulatory Visit: Payer: Self-pay | Admitting: Family Medicine

## 2011-08-24 ENCOUNTER — Other Ambulatory Visit: Payer: Self-pay | Admitting: *Deleted

## 2011-08-24 MED ORDER — ATORVASTATIN CALCIUM 10 MG PO TABS: 10.0000 mg | ORAL_TABLET | Freq: Every day | ORAL | Status: DC

## 2011-08-24 MED ORDER — LEVOTHYROXINE SODIUM 75 MCG PO TABS: 75.0000 ug | ORAL_TABLET | Freq: Every day | ORAL | Status: DC

## 2011-08-24 NOTE — Telephone Encounter (Signed)
Rx faxed.    KP 

## 2011-09-07 ENCOUNTER — Other Ambulatory Visit: Payer: Self-pay | Admitting: Family Medicine

## 2011-09-07 ENCOUNTER — Telehealth: Payer: Self-pay | Admitting: Family Medicine

## 2011-09-07 DIAGNOSIS — R52 Pain, unspecified: Secondary | ICD-10-CM

## 2011-09-07 DIAGNOSIS — R5383 Other fatigue: Secondary | ICD-10-CM

## 2011-09-07 DIAGNOSIS — R200 Anesthesia of skin: Secondary | ICD-10-CM

## 2011-09-07 NOTE — Telephone Encounter (Signed)
Insurance will not pay for B12 if it is not low------  Ok to check b12, cbcd, hep, tsh  Dx fatigue

## 2011-09-07 NOTE — Telephone Encounter (Signed)
No previous Dx of B-12 defiency and the system will not allow me to put in the order. Please advise    KP

## 2011-09-07 NOTE — Telephone Encounter (Signed)
Last B12 level checked 06-18-11- B12= 701 and was normal. Ok for Pt to restart b12 injection or would you prefer to order labs prior to starting injection again .Marland Kitchen Please advise

## 2011-09-10 ENCOUNTER — Other Ambulatory Visit: Payer: Medicare Other

## 2011-09-12 ENCOUNTER — Other Ambulatory Visit: Payer: Self-pay | Admitting: Family Medicine

## 2011-09-12 DIAGNOSIS — E039 Hypothyroidism, unspecified: Secondary | ICD-10-CM

## 2011-09-13 ENCOUNTER — Other Ambulatory Visit (INDEPENDENT_AMBULATORY_CARE_PROVIDER_SITE_OTHER): Payer: Medicare Other

## 2011-09-13 ENCOUNTER — Other Ambulatory Visit: Payer: Medicare Other

## 2011-09-13 DIAGNOSIS — E039 Hypothyroidism, unspecified: Secondary | ICD-10-CM

## 2011-09-13 NOTE — Progress Notes (Signed)
12  

## 2011-09-14 LAB — TSH: TSH: 1.77 u[IU]/mL (ref 0.35–5.50)

## 2011-09-18 ENCOUNTER — Encounter: Payer: Self-pay | Admitting: Family Medicine

## 2011-10-19 LAB — HM COLONOSCOPY

## 2011-11-08 ENCOUNTER — Other Ambulatory Visit: Payer: Self-pay | Admitting: Family Medicine

## 2011-11-08 MED ORDER — ALFUZOSIN HCL ER 10 MG PO TB24: 10.0000 mg | ORAL_TABLET | Freq: Every day | ORAL | Status: DC

## 2011-11-08 NOTE — Telephone Encounter (Signed)
Rx faxed.    KP 

## 2011-11-09 ENCOUNTER — Other Ambulatory Visit: Payer: Self-pay

## 2011-11-09 MED ORDER — UROXATRAL 10 MG PO TB24: 10.0000 mg | ORAL_TABLET | Freq: Every day | ORAL | Status: DC

## 2011-11-16 ENCOUNTER — Encounter: Payer: Self-pay | Admitting: Internal Medicine

## 2011-11-16 ENCOUNTER — Ambulatory Visit (INDEPENDENT_AMBULATORY_CARE_PROVIDER_SITE_OTHER): Payer: Medicare Other | Admitting: Internal Medicine

## 2011-11-16 VITALS — BP 138/80 | HR 93 | Temp 98.8°F | Resp 12 | Wt 197.0 lb

## 2011-11-16 DIAGNOSIS — J209 Acute bronchitis, unspecified: Secondary | ICD-10-CM

## 2011-11-16 MED ORDER — AZITHROMYCIN 250 MG PO TABS: ORAL_TABLET | ORAL | Status: AC

## 2011-11-16 NOTE — Progress Notes (Signed)
  Subjective:    Patient ID: Douglas Grant, male    DOB: [birth year], 77 y.o.   MRN: [id number]  HPI Respiratory tract infection Onset/symptoms:11/21 as NP cough Exposures (illness/environmental/extrinsic):no Progression of symptoms:to green sputum Treatments/response:fluids Present symptoms: Fever/chills/sweats:no Frontal headache:no Facial pain:no Nasal purulence:no Sore throat:no Lymphadenopathy:no Wheezing/shortness of breath:wheezing in RLDP only Pleuritic pain:no  Past medical history: ?asthma Smoking history:quit 1986           Review of Systems     Objective:   Physical Exam General appearance is of good health and nourishment; no acute distress or increased work of breathing is present.  No  lymphadenopathy about the head, neck, or axilla noted.   Eyes: No conjunctival inflammation or lid edema is present.   Ears:  External ear exam shows no significant lesions or deformities.  Otoscopic examination reveals clear canals, tympanic membranes are intact bilaterally without bulging, retraction, inflammation or discharge.  Nose:  External nasal examination shows no deformity or inflammation. Nasal mucosa aredry without lesions or exudates. No septal dislocation .No obstruction to airflow.   Oral exam: Dentures; lips and gums are healthy appearing.There is no oropharyngeal erythema or exudate noted.  Slightly hoarse   Heart:  Normal rate and regular rhythm. S1 and S2 normal without gallop,  click, rub or other extra sounds.  Grade 1/6 systolic murmur  Lungs:Surprisingly chest clear to auscultation; no wheezes, rhonchi,rales ,or rubs present.No increased work of breathing.    Extremities:  No cyanosis, edema, or clubbing  noted    Skin: Warm & dry           Assessment & Plan:  #1 bronchitis; no reactive airway component clinically present at this time.  Plan: See orders and recommendations

## 2011-11-16 NOTE — Patient Instructions (Signed)
Plain Mucinex for thick secretions ;force NON dairy fluids. Use a Neti pot daily as needed for sinus congestion  

## 2011-11-19 ENCOUNTER — Other Ambulatory Visit: Payer: Self-pay | Admitting: Family Medicine

## 2011-11-19 ENCOUNTER — Ambulatory Visit: Payer: Medicare Other

## 2011-11-19 DIAGNOSIS — J111 Influenza due to unidentified influenza virus with other respiratory manifestations: Secondary | ICD-10-CM

## 2011-11-19 MED ORDER — OSELTAMIVIR PHOSPHATE 75 MG PO CAPS: 75.0000 mg | ORAL_CAPSULE | Freq: Two times a day (BID) | ORAL | Status: DC

## 2011-11-19 MED ORDER — OSELTAMIVIR PHOSPHATE 75 MG PO CAPS: 75.0000 mg | ORAL_CAPSULE | Freq: Two times a day (BID) | ORAL | Status: AC

## 2011-11-20 ENCOUNTER — Other Ambulatory Visit: Payer: Self-pay | Admitting: Family Medicine

## 2011-11-20 MED ORDER — ESOMEPRAZOLE MAGNESIUM 40 MG PO CPDR: 40.0000 mg | DELAYED_RELEASE_CAPSULE | Freq: Every day | ORAL | Status: DC

## 2011-11-20 NOTE — Telephone Encounter (Signed)
Rx printed and left at check in---VM left to make the patient aware     KP

## 2011-11-27 ENCOUNTER — Ambulatory Visit (INDEPENDENT_AMBULATORY_CARE_PROVIDER_SITE_OTHER): Payer: Medicare Other

## 2011-11-27 DIAGNOSIS — Z23 Encounter for immunization: Secondary | ICD-10-CM

## 2011-11-28 ENCOUNTER — Other Ambulatory Visit: Payer: Self-pay

## 2011-11-28 MED ORDER — UROXATRAL 10 MG PO TB24: 10.0000 mg | ORAL_TABLET | Freq: Every day | ORAL | Status: DC

## 2011-11-28 MED ORDER — ATORVASTATIN CALCIUM 10 MG PO TABS: 10.0000 mg | ORAL_TABLET | Freq: Every day | ORAL | Status: DC

## 2011-11-28 MED ORDER — LEVOTHYROXINE SODIUM 75 MCG PO TABS: 75.0000 ug | ORAL_TABLET | Freq: Every day | ORAL | Status: DC

## 2011-11-28 MED ORDER — METFORMIN HCL 1000 MG PO TABS: 1000.0000 mg | ORAL_TABLET | Freq: Two times a day (BID) | ORAL | Status: DC

## 2011-11-28 NOTE — Telephone Encounter (Signed)
[place]    KP

## 2012-01-07 ENCOUNTER — Other Ambulatory Visit: Payer: Self-pay | Admitting: Family Medicine

## 2012-01-07 MED ORDER — TELMISARTAN 80 MG PO TABS: 80.0000 mg | ORAL_TABLET | Freq: Two times a day (BID) | ORAL | Status: DC

## 2012-01-07 NOTE — Telephone Encounter (Signed)
Faxed.   KP 

## 2012-02-04 DIAGNOSIS — H25099 Other age-related incipient cataract, unspecified eye: Secondary | ICD-10-CM | POA: Diagnosis not present

## 2012-02-12 LAB — HM DIABETES EYE EXAM

## 2012-02-15 ENCOUNTER — Telehealth: Payer: Self-pay | Admitting: Family Medicine

## 2012-02-15 NOTE — Telephone Encounter (Signed)
ERROR

## 2012-02-19 ENCOUNTER — Ambulatory Visit: Payer: Medicare Other | Admitting: Family Medicine

## 2012-02-21 ENCOUNTER — Other Ambulatory Visit: Payer: Self-pay | Admitting: Family Medicine

## 2012-02-26 ENCOUNTER — Ambulatory Visit (INDEPENDENT_AMBULATORY_CARE_PROVIDER_SITE_OTHER): Payer: Medicare Other | Admitting: Family Medicine

## 2012-02-26 ENCOUNTER — Encounter: Payer: Self-pay | Admitting: Family Medicine

## 2012-02-26 VITALS — BP 122/64 | HR 88 | Temp 98.8°F | Wt 201.2 lb

## 2012-02-26 DIAGNOSIS — M1990 Unspecified osteoarthritis, unspecified site: Secondary | ICD-10-CM

## 2012-02-26 DIAGNOSIS — K219 Gastro-esophageal reflux disease without esophagitis: Secondary | ICD-10-CM | POA: Diagnosis not present

## 2012-02-26 DIAGNOSIS — E119 Type 2 diabetes mellitus without complications: Secondary | ICD-10-CM

## 2012-02-26 DIAGNOSIS — N400 Enlarged prostate without lower urinary tract symptoms: Secondary | ICD-10-CM

## 2012-02-26 DIAGNOSIS — Z87898 Personal history of other specified conditions: Secondary | ICD-10-CM | POA: Diagnosis not present

## 2012-02-26 DIAGNOSIS — E785 Hyperlipidemia, unspecified: Secondary | ICD-10-CM | POA: Diagnosis not present

## 2012-02-26 DIAGNOSIS — M129 Arthropathy, unspecified: Secondary | ICD-10-CM

## 2012-02-26 DIAGNOSIS — E039 Hypothyroidism, unspecified: Secondary | ICD-10-CM | POA: Diagnosis not present

## 2012-02-26 DIAGNOSIS — I10 Essential (primary) hypertension: Secondary | ICD-10-CM

## 2012-02-26 MED ORDER — METFORMIN HCL 1000 MG PO TABS: 1000.0000 mg | ORAL_TABLET | Freq: Two times a day (BID) | ORAL | Status: DC

## 2012-02-26 MED ORDER — ESOMEPRAZOLE MAGNESIUM 40 MG PO CPDR: 40.0000 mg | DELAYED_RELEASE_CAPSULE | Freq: Every day | ORAL | Status: DC

## 2012-02-26 MED ORDER — FINASTERIDE 5 MG PO TABS: ORAL_TABLET | ORAL | Status: DC

## 2012-02-26 MED ORDER — TRAMADOL HCL 50 MG PO TABS: ORAL_TABLET | ORAL | Status: DC

## 2012-02-26 MED ORDER — UROXATRAL 10 MG PO TB24: 10.0000 mg | ORAL_TABLET | Freq: Every day | ORAL | Status: DC

## 2012-02-26 MED ORDER — TELMISARTAN 80 MG PO TABS: ORAL_TABLET | ORAL | Status: DC

## 2012-02-26 MED ORDER — ATORVASTATIN CALCIUM 10 MG PO TABS: 10.0000 mg | ORAL_TABLET | Freq: Every day | ORAL | Status: DC

## 2012-02-26 MED ORDER — TELMISARTAN 80 MG PO TABS: 80.0000 mg | ORAL_TABLET | Freq: Two times a day (BID) | ORAL | Status: DC

## 2012-02-26 MED ORDER — LEVOTHYROXINE SODIUM 75 MCG PO TABS: 75.0000 ug | ORAL_TABLET | Freq: Every day | ORAL | Status: DC

## 2012-02-26 NOTE — Assessment & Plan Note (Signed)
Cont meds Check labs 

## 2012-02-26 NOTE — Assessment & Plan Note (Signed)
Stable con't meds 

## 2012-02-26 NOTE — Assessment & Plan Note (Signed)
con't meds 

## 2012-02-26 NOTE — Assessment & Plan Note (Signed)
Check labs con't meds 

## 2012-02-26 NOTE — Assessment & Plan Note (Signed)
Check labs con't BS checks con't meds

## 2012-02-26 NOTE — Patient Instructions (Signed)

## 2012-02-26 NOTE — Progress Notes (Signed)
  Subjective:    Patient ID: Douglas Grant, male    DOB: 1930-12-02, 78 y.o.   MRN: [id number]  HPI Pt here for f/u and c/o memory loss.  He is driving and never gets lost and takes care of household with no problems. HYPERTENSION Disease Monitoring Blood pressure range-normal Chest pain- no      Dyspnea- no Medications Compliance- good Lightheadedness- no   Edema- no   DIABETES Disease Monitoring Blood Sugar ranges-100-110 Polyuria- no New Visual problems- no Medications Compliance- good Hypoglycemic symptoms- no   HYPERLIPIDEMIA Disease Monitoring See symptoms for Hypertension Medications Compliance- good RUQ pain- no  Muscle aches- no  ROS See HPI above   PMH Smoking Status noted     Review of Systems As above    Objective:   Physical Exam  Constitutional: He is oriented to person, place, and time. He appears well-developed and well-nourished.  Neck: Normal range of motion. Neck supple.  Cardiovascular: Normal rate, regular rhythm and normal heart sounds.   No murmur heard. Pulmonary/Chest: Effort normal and breath sounds normal. No respiratory distress. He has no wheezes. He has no rales.  Musculoskeletal: Normal range of motion. He exhibits no edema and no tenderness.  Neurological: He is alert and oriented to person, place, and time.  Psychiatric: He has a normal mood and affect. His behavior is normal. Judgment and thought content normal.  MMSE--30/30 Sensory exam of the foot is normal, tested with the monofilament. Good pulses, no lesions or ulcers, good peripheral pulses.        Assessment & Plan:

## 2012-02-26 NOTE — Assessment & Plan Note (Signed)
Controlled with nexium 

## 2012-02-27 LAB — LIPID PANEL
Cholesterol: 136 mg/dL (ref 0–200)
HDL: 48.5 mg/dL (ref >39.00)
LDL Cholesterol: 67 mg/dL (ref 0–99)
VLDL: 21 mg/dL (ref 0.0–40.0)

## 2012-02-27 LAB — HEPATIC FUNCTION PANEL
AST: 18 U/L (ref 0–37)
Alkaline Phosphatase: 45 U/L (ref 39–117)
Total Bilirubin: 0.6 mg/dL (ref 0.3–1.2)

## 2012-02-27 LAB — HEMOGLOBIN A1C: Hgb A1c MFr Bld: 6 % (ref 4.6–6.5)

## 2012-02-27 LAB — BASIC METABOLIC PANEL
Chloride: 101 mEq/L (ref 96–112)
Creatinine, Ser: 0.9 mg/dL (ref 0.4–1.5)
Potassium: 4.3 mEq/L (ref 3.5–5.1)

## 2012-02-28 LAB — MICROALBUMIN / CREATININE URINE RATIO
Creatinine,U: 126.5 mg/dL
Microalb Creat Ratio: 1.6 mg/g (ref 0.0–30.0)
Microalb, Ur: 2 mg/dL — ABNORMAL HIGH (ref 0.0–1.9)

## 2012-02-28 LAB — POCT URINALYSIS DIPSTICK
Ketones, UA: NEGATIVE
Protein, UA: NEGATIVE

## 2012-02-28 NOTE — Progress Notes (Signed)
Addended by: Silvio Pate D on: 02/28/2012 11:45 AM   Modules accepted: Orders

## 2012-03-06 ENCOUNTER — Other Ambulatory Visit: Payer: Self-pay | Admitting: Family Medicine

## 2012-03-06 ENCOUNTER — Telehealth: Payer: Self-pay | Admitting: Family Medicine

## 2012-03-06 NOTE — Telephone Encounter (Signed)
RX already sent to pharmacy

## 2012-03-28 ENCOUNTER — Telehealth: Payer: Self-pay | Admitting: Family Medicine

## 2012-03-28 NOTE — Telephone Encounter (Signed)
Did the bleeding con't until he stopped aspirin--- he should be on it but if bleeding con't stop.

## 2012-03-28 NOTE — Telephone Encounter (Signed)
Discussed with patient and he will stop if the bleeding starts again.     KP

## 2012-03-28 NOTE — Telephone Encounter (Signed)
Please advise      KP 

## 2012-03-28 NOTE — Telephone Encounter (Signed)
Patient came into the office today to let us know that he stopped taking aspirin a few days ago due to bloody nose. He states it has started to get some better and wants to know if he needs to start taking the aspirin again. Please advise.

## 2012-06-16 ENCOUNTER — Telehealth: Payer: Self-pay | Admitting: Family Medicine

## 2012-06-16 NOTE — Telephone Encounter (Signed)
Ok per Dr.Lowne and samples were given for 20 mg , patient has been instructed to take 1/2 tablet daily.     KP

## 2012-06-16 NOTE — Telephone Encounter (Signed)
Pt states he would like samples of Lipitor. The price went up from $5 to $15. Please advise.

## 2012-07-01 ENCOUNTER — Telehealth: Payer: Self-pay | Admitting: Family Medicine

## 2012-07-01 DIAGNOSIS — I10 Essential (primary) hypertension: Secondary | ICD-10-CM

## 2012-07-01 DIAGNOSIS — E785 Hyperlipidemia, unspecified: Secondary | ICD-10-CM

## 2012-07-01 DIAGNOSIS — N400 Enlarged prostate without lower urinary tract symptoms: Secondary | ICD-10-CM

## 2012-07-01 DIAGNOSIS — K219 Gastro-esophageal reflux disease without esophagitis: Secondary | ICD-10-CM

## 2012-07-01 MED ORDER — ESOMEPRAZOLE MAGNESIUM 40 MG PO CPDR: 40.0000 mg | DELAYED_RELEASE_CAPSULE | Freq: Every day | ORAL | Status: DC

## 2012-07-01 MED ORDER — UROXATRAL 10 MG PO TB24: 10.0000 mg | ORAL_TABLET | Freq: Every day | ORAL | Status: DC

## 2012-07-01 MED ORDER — TELMISARTAN 80 MG PO TABS: 80.0000 mg | ORAL_TABLET | Freq: Two times a day (BID) | ORAL | Status: DC

## 2012-07-01 MED ORDER — ATORVASTATIN CALCIUM 10 MG PO TABS: 10.0000 mg | ORAL_TABLET | Freq: Every day | ORAL | Status: DC

## 2012-07-01 MED ORDER — FINASTERIDE 5 MG PO TABS: ORAL_TABLET | ORAL | Status: DC

## 2012-07-01 MED ORDER — METFORMIN HCL 1000 MG PO TABS: 1000.0000 mg | ORAL_TABLET | Freq: Two times a day (BID) | ORAL | Status: DC

## 2012-07-01 NOTE — Telephone Encounter (Signed)
Patient aware Rx ready for pick up.      KP 

## 2012-07-01 NOTE — Telephone Encounter (Signed)
Pt states he needs written prescriptions of all his meds to take to [facility] ([address] He is requesting 3 month supply. Call [phone] when ready for pick up

## 2012-07-03 ENCOUNTER — Telehealth: Payer: Self-pay | Admitting: Family Medicine

## 2012-07-03 DIAGNOSIS — E039 Hypothyroidism, unspecified: Secondary | ICD-10-CM

## 2012-07-03 MED ORDER — LEVOTHYROXINE SODIUM 75 MCG PO TABS: 75.0000 ug | ORAL_TABLET | Freq: Every day | ORAL | Status: DC

## 2012-07-03 NOTE — Telephone Encounter (Signed)
Patient walk in wanting a refill on his synthroid 75 mcg for 3 month supply to take [facility]  Tomorrow.

## 2012-07-03 NOTE — Telephone Encounter (Signed)
Done  KP

## 2012-07-28 ENCOUNTER — Other Ambulatory Visit: Payer: Self-pay | Admitting: Family Medicine

## 2012-07-28 DIAGNOSIS — E039 Hypothyroidism, unspecified: Secondary | ICD-10-CM

## 2012-07-28 MED ORDER — LEVOTHYROXINE SODIUM 75 MCG PO TABS: 75.0000 ug | ORAL_TABLET | Freq: Every day | ORAL | Status: DC

## 2012-07-28 NOTE — Telephone Encounter (Signed)
PT CAME IN AND NEEDS REFILL ON HIS MED - HE HAS NOT BEEN ON IT FOR THREE WEEKS NOW   SYNTHROID, LEVOTHROID 75 MCG Take 1 tablet (75 mcg total) by mouth daily.

## 2012-08-29 ENCOUNTER — Ambulatory Visit (INDEPENDENT_AMBULATORY_CARE_PROVIDER_SITE_OTHER): Payer: Medicare Other | Admitting: Family Medicine

## 2012-08-29 ENCOUNTER — Encounter: Payer: Self-pay | Admitting: Family Medicine

## 2012-08-29 VITALS — BP 118/68 | HR 82 | Temp 98.3°F | Wt 200.8 lb

## 2012-08-29 DIAGNOSIS — R197 Diarrhea, unspecified: Secondary | ICD-10-CM

## 2012-08-29 DIAGNOSIS — J069 Acute upper respiratory infection, unspecified: Secondary | ICD-10-CM | POA: Diagnosis not present

## 2012-08-29 LAB — CBC WITH DIFFERENTIAL/PLATELET
Basophils Relative: 0.3 % (ref 0.0–3.0)
Eosinophils Relative: 9 % — ABNORMAL HIGH (ref 0.0–5.0)
HCT: 36.1 % — ABNORMAL LOW (ref 39.0–52.0)
Lymphs Abs: 1.7 10*3/uL (ref 0.7–4.0)
MCV: 89.2 fl (ref 78.0–100.0)
Monocytes Absolute: 0.5 10*3/uL (ref 0.1–1.0)
Monocytes Relative: 9.4 % (ref 3.0–12.0)
Platelets: 263 10*3/uL (ref 150.0–400.0)
RBC: 4.05 Mil/uL — ABNORMAL LOW (ref 4.22–5.81)
WBC: 5.5 10*3/uL (ref 4.5–10.5)

## 2012-08-29 LAB — TSH: TSH: 1.74 u[IU]/mL (ref 0.35–5.50)

## 2012-08-29 MED ORDER — HYOSCYAMINE SULFATE 0.125 MG SL SUBL: 0.1250 mg | SUBLINGUAL_TABLET | SUBLINGUAL | Status: DC | PRN

## 2012-08-29 NOTE — Patient Instructions (Addendum)
Diarrhea Infections caused by germs (bacterial) or a virus commonly cause diarrhea. Your caregiver has determined that with time, rest and fluids, the diarrhea should improve. In general, eat normally while drinking more water than usual. Although water may prevent dehydration, it does not contain salt and minerals (electrolytes). Broths, weak tea without caffeine and oral rehydration solutions (ORS) replace fluids and electrolytes. Small amounts of fluids should be taken frequently. Large amounts at one time may not be tolerated. Plain water may be harmful in infants and the elderly. Oral rehydrating solutions (ORS) are available at pharmacies and grocery stores. ORS replace water and important electrolytes in proper proportions. Sports drinks are not as effective as ORS and may be harmful due to sugars worsening diarrhea.  ORS is especially recommended for use in children with diarrhea. As a general guideline for children, replace any new fluid losses from diarrhea and/or vomiting with ORS as follows:   If your child weighs 22 pounds or under (10 kg or less), give 60-120 mL ( -  cup or 2 - 4 ounces) of ORS for each episode of diarrheal stool or vomiting episode.   If your child weighs more than 22 pounds (more than 10 kgs), give 120-240 mL ( - 1 cup or 4 - 8 ounces) of ORS for each diarrheal stool or episode of vomiting.   While correcting for dehydration, children should eat normally. However, foods high in sugar should be avoided because this may worsen diarrhea. Large amounts of carbonated soft drinks, juice, gelatin desserts and other highly sugared drinks should be avoided.   After correction of dehydration, other liquids that are appealing to the child may be added. Children should drink small amounts of fluids frequently and fluids should be increased as tolerated. Children should drink enough fluids to keep urine clear or pale yellow.   Adults should eat normally while drinking more fluids  than usual. Drink small amounts of fluids frequently and increase as tolerated. Drink enough fluids to keep urine clear or pale yellow. Broths, weak decaffeinated tea, lemon lime soft drinks (allowed to go flat) and ORS replace fluids and electrolytes.   Avoid:   Carbonated drinks.   Juice.   Extremely hot or cold fluids.   Caffeine drinks.   Fatty, greasy foods.   Alcohol.   Tobacco.   Too much intake of anything at one time.   Gelatin desserts.   Probiotics are active cultures of beneficial bacteria. They may lessen the amount and number of diarrheal stools in adults. Probiotics can be found in yogurt with active cultures and in supplements.   Wash hands well to avoid spreading bacteria and virus.   Anti-diarrheal medications are not recommended for infants and children.   Only take over-the-counter or prescription medicines for pain, discomfort or fever as directed by your caregiver. Do not give aspirin to children because it may cause Reye's Syndrome.   For adults, ask your caregiver if you should continue all prescribed and over-the-counter medicines.   If your caregiver has given you a follow-up appointment, it is very important to keep that appointment. Not keeping the appointment could result in a chronic or permanent injury, and disability. If there is any problem keeping the appointment, you must call back to this facility for assistance.  SEEK IMMEDIATE MEDICAL CARE IF:   You or your child is unable to keep fluids down or other symptoms or problems become worse in spite of treatment.   Vomiting or diarrhea develops and becomes persistent.     There is vomiting of blood or bile (green material).   There is blood in the stool or the stools are black and tarry.   There is no urine output in 6-8 hours or there is only a small amount of very dark urine.   Abdominal pain develops, increases or localizes.   You have a fever.   Your baby is older than 3 months with a  rectal temperature of 102 F (38.9 C) or higher.   Your baby is 3 months old or younger with a rectal temperature of 100.4 F (38 C) or higher.   You or your child develops excessive weakness, dizziness, fainting or extreme thirst.   You or your child develops a rash, stiff neck, severe headache or become irritable or sleepy and difficult to awaken.  MAKE SURE YOU:   Understand these instructions.   Will watch your condition.   Will get help right away if you are not doing well or get worse.  Document Released: 11/30/2002 Document Revised: 11/29/2011 Document Reviewed: 10/17/2009 [employer] Patient Information 2012 [employer]. 

## 2012-08-29 NOTE — Progress Notes (Signed)
  Subjective:     Douglas Grant is a 78 y.o. male who presents for evaluation of diarrhea. Onset of diarrhea was 2 days ago. Diarrhea is occurring approximately many times per day--he has not counted. Patient describes diarrhea as watery. Diarrhea has been associated with nothing. Patient denies blood in stool, fever, illness in household contacts, recent antibiotic use, recent camping, recent travel, significant abdominal pain, unintentional weight loss. Previous visits for diarrhea: none. Evaluation to date: none.  Treatment to date: none.  He also c/o runny nose ,  No cough, no fever.  No congestion.  The following portions of the patient's history were reviewed and updated as appropriate: allergies, current medications, past family history, past medical history, past social history, past surgical history and problem list.  Review of Systems Pertinent items are noted in HPI.    Objective:    BP 118/68  Pulse 82  Temp 98.3 F (36.8 C) (Oral)  Wt 200 lb 12.8 oz (91.082 kg)  SpO2 94% General: alert, cooperative, appears stated age and no distress  Hydration:  well hydrated  Abdomen:    soft, non-tender; bowel sounds normal; no masses,  no organomegaly   rectal--heme neg brown stool Nose-- clear drainage,  turb errythematous Assessment:    Diarrhea of uncertain etiology, moderate in severity    URI---dymista,  Can use otc antihistamine as well Plan:    Appropriate educational material discussed and distributed. Discussed the appropriate management of diarrhea. Stool studies per orders. levsin prn

## 2012-09-01 LAB — HEPATIC FUNCTION PANEL
ALT: 11 U/L (ref 0–53)
AST: 18 U/L (ref 0–37)
Bilirubin, Direct: 0.1 mg/dL (ref 0.0–0.3)
Total Bilirubin: 0.6 mg/dL (ref 0.3–1.2)
Total Protein: 6.8 g/dL (ref 6.0–8.3)

## 2012-09-01 LAB — BASIC METABOLIC PANEL
Chloride: 105 mEq/L (ref 96–112)
GFR: 90.95 mL/min (ref >60.00)
Potassium: 4.4 mEq/L (ref 3.5–5.1)
Sodium: 138 mEq/L (ref 135–145)

## 2012-09-02 ENCOUNTER — Encounter: Payer: Self-pay | Admitting: Internal Medicine

## 2012-09-02 ENCOUNTER — Ambulatory Visit (INDEPENDENT_AMBULATORY_CARE_PROVIDER_SITE_OTHER): Payer: Medicare Other | Admitting: Internal Medicine

## 2012-09-02 VITALS — BP 118/60 | HR 79 | Temp 98.0°F | Wt 201.0 lb

## 2012-09-02 DIAGNOSIS — R51 Headache: Secondary | ICD-10-CM

## 2012-09-02 MED ORDER — HYDROCODONE-ACETAMINOPHEN 5-500 MG PO CAPS: 1.0000 | ORAL_CAPSULE | Freq: Four times a day (QID) | ORAL | Status: AC | PRN

## 2012-09-02 NOTE — Progress Notes (Signed)
  Subjective:    Patient ID: Douglas Grant, male    DOB: 11-19-1930, 78 y.o.   MRN: [id number]  HPI Acute visit 2 days history of a headache, located at the top of the head mostly on the right side. The headache is on and off, as intense as 7-10, Ultram did not help,  definitely worse by touching his scalp. When asked if this was the worst he ever had he said "I think so"  Past Medical History  Diagnosis Date  . Diabetes mellitus   . Hyperlipidemia   . GERD (gastroesophageal reflux disease)   . Thyroid disease   . Hypertension   . Heart murmur   . Stroke 03/2011    R lat thalamus, post in int capsule    Past Surgical History  Procedure Date  . Appendectomy     Review of Systems Denies any rash. No fever or chills No nausea or vomiting No head injury No diplopia, slurred speech, facial numbness. No fever, weight loss or increase intensity or regular aches and pains.     Objective:   Physical Exam General -- alert, well-developed, and overweight appearing. No apparent distress.  Neck -- no tender to palpation of the cervical spine HEENT -- TMs normal, throat w/o redness, face symmetric and not tender to palpation, nose not congested, EOMI, pupils small but symmetric. The scalp-neck-face are free of any rash. Lungs -- normal respiratory effort, no intercostal retractions, no accessory muscle use, and normal breath sounds.   Heart-- normal rate, regular rhythm, no murmur, and no gallop.    Neurologic-- alert & oriented X3 ; face essentially symmetric, DTRs slightly decreased throughout but symmetric Speech clear and fluent. Gait appropriate for age. Psych-- Cognition and judgment appear intact. Alert and cooperative with normal attention span and concentration.  not anxious appearing and not depressed appearing.       Assessment & Plan:

## 2012-09-02 NOTE — Patient Instructions (Addendum)
We are scheduling a CT of the head today. Rest, take lots of fluids. For few days, stop Ultram and take hydrocodone for headaches or pain. May cause drowsiness. If the headache is severe, you have a slurred speech, double vision --->  go to the emergency room. Also, call us immediately if you develop any rash, blisters in the head, face or neck.

## 2012-09-02 NOTE — Assessment & Plan Note (Addendum)
78 year old gentleman presents with 2 days history of headache, intense at times, not better with Ultram. The character of the headache seems to be  neuralgic, early shingles?. He has no rash. Given the intensity of the headache I think a CT of the head is appropriate  to rule a out intracranial bleed. Will treat symptomatically with hydrocodone and  observation. Strongly advised to call if he developes any type of rash Addendum:  CT negative for acute changes, sinusitis?. Will notify the patient, plan is the same, if not better will consider also possibly treatment for sinusitis. CT report will be scan

## 2012-09-09 ENCOUNTER — Other Ambulatory Visit: Payer: Self-pay | Admitting: Family Medicine

## 2012-09-09 MED ORDER — FERROUS SULFATE 325 (65 FE) MG PO TABS: 325.0000 mg | ORAL_TABLET | Freq: Every day | ORAL | Status: DC

## 2012-09-12 ENCOUNTER — Encounter: Payer: Self-pay | Admitting: Internal Medicine

## 2012-10-16 ENCOUNTER — Ambulatory Visit (INDEPENDENT_AMBULATORY_CARE_PROVIDER_SITE_OTHER): Payer: Medicare Other | Admitting: Internal Medicine

## 2012-10-16 ENCOUNTER — Encounter: Payer: Self-pay | Admitting: Internal Medicine

## 2012-10-16 VITALS — BP 144/68 | HR 75 | Temp 98.5°F | Wt 201.0 lb

## 2012-10-16 DIAGNOSIS — J069 Acute upper respiratory infection, unspecified: Secondary | ICD-10-CM | POA: Diagnosis not present

## 2012-10-16 MED ORDER — AZELASTINE HCL 0.1 % NA SOLN: 2.0000 | Freq: Two times a day (BID) | NASAL | Status: DC

## 2012-10-16 NOTE — Progress Notes (Signed)
  Subjective:    Patient ID: Douglas Grant, male    DOB: 06-Jun-1930, 78 y.o.   MRN: [id number]  HPI Acute visit Symptoms started 3 days ago: Cough with dark yellow sputum, runny nose, sore throat, hoarseness. Is taking a OTC cough remedy, name? Also, it was seen last month with headaches, headaches are gone  Past Medical History  Diagnosis Date  . Diabetes mellitus   . Hyperlipidemia   . GERD (gastroesophageal reflux disease)   . Thyroid disease   . Hypertension   . Heart murmur   . Stroke 03/2011    R lat thalamus, post in int capsule   Past Surgical History  Procedure Date  . Appendectomy     Review of Systems Denies fever, nausea, vomiting. He had nonbloody diarrhea 2 days ago. Denies myalgias No shortness of breath, chest pain.    Objective:   Physical Exam  General -- alert, well-developed, and overweight appearing. No apparent distress.  HEENT -- TMs normal, throat w/o redness, face symmetric and not tender to palpation, nose not congested  Lungs -- normal respiratory effort, no intercostal retractions, no accessory muscle use, and normal breath sounds.   Heart-- normal rate, regular rhythm, no murmur, and no gallop.     Extremities-- no pretibial edema bilaterally  Psych-- Cognition and judgment appear intact. Alert and cooperative with normal attention span and concentration.  not anxious appearing and not depressed appearing.       Assessment & Plan:  URI, See instructions

## 2012-10-16 NOTE — Patient Instructions (Addendum)
Rest, fluids , tylenol For cough, take Mucinex DM twice a day as needed  For congestion use astelin nasal spray twice a day until you feel better Call if no better in few days Call anytime if the symptoms are severe, you have high fever, short of breath, chest pain

## 2012-10-21 ENCOUNTER — Ambulatory Visit (INDEPENDENT_AMBULATORY_CARE_PROVIDER_SITE_OTHER): Payer: Medicare Other

## 2012-10-21 DIAGNOSIS — Z23 Encounter for immunization: Secondary | ICD-10-CM | POA: Diagnosis not present

## 2012-11-03 ENCOUNTER — Ambulatory Visit (HOSPITAL_BASED_OUTPATIENT_CLINIC_OR_DEPARTMENT_OTHER)
Admission: RE | Admit: 2012-11-03 | Discharge: 2012-11-03 | Disposition: A | Discharge status: Home / Self Care | Payer: Medicare Other | Source: Ambulatory Visit | Attending: Family Medicine | Admitting: Family Medicine

## 2012-11-03 ENCOUNTER — Other Ambulatory Visit: Payer: Self-pay | Admitting: Family Medicine

## 2012-11-03 DIAGNOSIS — R059 Cough, unspecified: Secondary | ICD-10-CM | POA: Insufficient documentation

## 2012-11-03 DIAGNOSIS — R05 Cough: Secondary | ICD-10-CM

## 2012-11-03 DIAGNOSIS — J9819 Other pulmonary collapse: Secondary | ICD-10-CM | POA: Diagnosis not present

## 2012-11-04 ENCOUNTER — Telehealth: Payer: Self-pay

## 2012-11-04 MED ORDER — AZITHROMYCIN 250 MG PO TABS: ORAL_TABLET | ORAL | Status: DC

## 2012-11-04 NOTE — Telephone Encounter (Signed)
Discussed with patient and he voiced understanding. Rx sent      KP

## 2012-11-04 NOTE — Telephone Encounter (Signed)
Message copied by Arnette Norris on Tue Nov 04, 2012  4:47 PM ------      Message from: Lelon Perla      Created: Mon Nov 03, 2012  4:44 PM       No pneumonia      Pt c/o inc mucous production----- z pack #1  As directed

## 2013-01-12 ENCOUNTER — Encounter: Payer: Self-pay | Admitting: Lab

## 2013-01-13 ENCOUNTER — Ambulatory Visit (INDEPENDENT_AMBULATORY_CARE_PROVIDER_SITE_OTHER): Payer: Medicare Other | Admitting: Family Medicine

## 2013-01-13 ENCOUNTER — Encounter: Payer: Self-pay | Admitting: Family Medicine

## 2013-01-13 VITALS — BP 130/66 | HR 67 | Temp 98.3°F | Wt 202.0 lb

## 2013-01-13 DIAGNOSIS — IMO0002 Reserved for concepts with insufficient information to code with codable children: Secondary | ICD-10-CM

## 2013-01-13 DIAGNOSIS — J069 Acute upper respiratory infection, unspecified: Secondary | ICD-10-CM | POA: Diagnosis not present

## 2013-01-13 DIAGNOSIS — S90859A Superficial foreign body, unspecified foot, initial encounter: Secondary | ICD-10-CM

## 2013-01-13 MED ORDER — AZELASTINE-FLUTICASONE 137-50 MCG/ACT NA SUSP: 1.0000 | Freq: Two times a day (BID) | NASAL | Status: DC

## 2013-01-13 NOTE — Patient Instructions (Addendum)

## 2013-01-13 NOTE — Progress Notes (Signed)
  Subjective:     Douglas Grant is a 79 y.o. male who presents for evaluation of sinus pain. Symptoms include: clear rhinorrhea, congestion, facial pain and sinus pressure. Onset of symptoms was 2 days ago. Symptoms have been gradually worsening since that time. Past history is significant for no history of pneumonia or bronchitis. Patient is a non-smoker.  Pt also c/o ?foreign body bottom L foot.  His wife pulled what looked like a broom straw out of it but it has been painful since.   The following portions of the patient's history were reviewed and updated as appropriate: allergies, current medications, past family history, past medical history, past social history, past surgical history and problem list.  Review of Systems Pertinent items are noted in HPI.   Objective:    BP 130/66  Pulse 67  Temp 98.3 F (36.8 C) (Oral)  Wt 202 lb (91.627 kg)  SpO2 95% General appearance: alert, cooperative, appears stated age and no distress Ears: normal TM's and external ear canals both ears Nose: clear discharge, mild congestion, turbinates red, swollen, no sinus tenderness Throat: lips, mucosa, and tongue normal; teeth and gums normal Neck: no adenopathy, supple, symmetrical, trachea midline and thyroid not enlarged, symmetric, no tenderness/mass/nodules Extremities: extremities normal, atraumatic, no cyanosis or edema                            L foot-- + tender area bottom foot with ? Foreign body   Assessment:    Acute viral sinusitis ? Foreign body foot-- to see podiatry today.     Plan:    Nasal steroids per medication orders. Antihistamines per medication orders. f/u prn

## 2013-01-15 ENCOUNTER — Telehealth: Payer: Self-pay | Admitting: Family Medicine

## 2013-01-15 MED ORDER — AZITHROMYCIN 250 MG PO TABS: ORAL_TABLET | ORAL | Status: DC

## 2013-01-15 MED ORDER — CEFUROXIME AXETIL 500 MG PO TABS: 500.0000 mg | ORAL_TABLET | Freq: Two times a day (BID) | ORAL | Status: DC

## 2013-01-15 NOTE — Telephone Encounter (Signed)
Pt was seen for uri and symptoms are no better he is requesting a z pak.   Response:  zpak not great for sinus---if congestion is still in head he would be better off with ceftin 500 mg 1 po bid for 10 day

## 2013-01-15 NOTE — Telephone Encounter (Signed)
Rx faxed to [facility] on [address].      KP

## 2013-01-20 DIAGNOSIS — D485 Neoplasm of uncertain behavior of skin: Secondary | ICD-10-CM | POA: Diagnosis not present

## 2013-01-20 DIAGNOSIS — Q6689 Other  specified congenital deformities of feet: Secondary | ICD-10-CM | POA: Diagnosis not present

## 2013-01-20 DIAGNOSIS — M21619 Bunion of unspecified foot: Secondary | ICD-10-CM | POA: Diagnosis not present

## 2013-02-03 DIAGNOSIS — D485 Neoplasm of uncertain behavior of skin: Secondary | ICD-10-CM | POA: Diagnosis not present

## 2013-02-16 ENCOUNTER — Telehealth: Payer: Self-pay | Admitting: Family Medicine

## 2013-02-16 DIAGNOSIS — K219 Gastro-esophageal reflux disease without esophagitis: Secondary | ICD-10-CM

## 2013-02-16 DIAGNOSIS — E039 Hypothyroidism, unspecified: Secondary | ICD-10-CM

## 2013-02-16 DIAGNOSIS — N400 Enlarged prostate without lower urinary tract symptoms: Secondary | ICD-10-CM

## 2013-02-16 DIAGNOSIS — E785 Hyperlipidemia, unspecified: Secondary | ICD-10-CM

## 2013-02-16 MED ORDER — UROXATRAL 10 MG PO TB24: 10.0000 mg | ORAL_TABLET | Freq: Every day | ORAL | Status: DC

## 2013-02-16 NOTE — Telephone Encounter (Signed)
He needs a few pills refill for Alfuzosin 10mg  tablets to go to the [facility] - then he would like refills on all meds including Alfuzosin to the [facility]. He needs a call to confirm. The wife also needs refills called into [facility]. They need to go within the next day or two.

## 2013-02-17 MED ORDER — UROXATRAL 10 MG PO TB24: 10.0000 mg | ORAL_TABLET | Freq: Every day | ORAL | Status: DC

## 2013-02-17 MED ORDER — METFORMIN HCL 1000 MG PO TABS: 1000.0000 mg | ORAL_TABLET | Freq: Two times a day (BID) | ORAL | Status: DC

## 2013-02-17 MED ORDER — LEVOTHYROXINE SODIUM 75 MCG PO TABS: 75.0000 ug | ORAL_TABLET | Freq: Every day | ORAL | Status: DC

## 2013-02-17 MED ORDER — ESOMEPRAZOLE MAGNESIUM 40 MG PO CPDR: 40.0000 mg | DELAYED_RELEASE_CAPSULE | Freq: Every day | ORAL | Status: DC

## 2013-02-17 MED ORDER — TELMISARTAN 80 MG PO TABS: 80.0000 mg | ORAL_TABLET | Freq: Every day | ORAL | Status: DC

## 2013-02-17 MED ORDER — FINASTERIDE 5 MG PO TABS: ORAL_TABLET | ORAL | Status: DC

## 2013-02-17 NOTE — Telephone Encounter (Signed)
Verified the six medications that the patient needed. Rx printed and the patient will pick up tomorrow.      KP

## 2013-02-17 NOTE — Addendum Note (Signed)
Addended by: Arnette Norris on: 02/17/2013 04:33 PM   Modules accepted: Orders

## 2013-02-19 ENCOUNTER — Other Ambulatory Visit (INDEPENDENT_AMBULATORY_CARE_PROVIDER_SITE_OTHER): Payer: Medicare Other

## 2013-02-19 DIAGNOSIS — E119 Type 2 diabetes mellitus without complications: Secondary | ICD-10-CM

## 2013-02-19 DIAGNOSIS — E785 Hyperlipidemia, unspecified: Secondary | ICD-10-CM | POA: Diagnosis not present

## 2013-02-19 LAB — HEPATIC FUNCTION PANEL
ALT: 10 U/L (ref 0–53)
AST: 14 U/L (ref 0–37)
Alkaline Phosphatase: 39 U/L (ref 39–117)
Bilirubin, Direct: 0.1 mg/dL (ref 0.0–0.3)
Total Bilirubin: 0.9 mg/dL (ref 0.3–1.2)

## 2013-02-19 LAB — BASIC METABOLIC PANEL
BUN: 11 mg/dL (ref 6–23)
Calcium: 8.6 mg/dL (ref 8.4–10.5)
Creatinine, Ser: 0.9 mg/dL (ref 0.4–1.5)
GFR: 92.07 mL/min (ref >60.00)
Glucose, Bld: 92 mg/dL (ref 70–99)

## 2013-03-03 MED ORDER — PRAVASTATIN SODIUM 20 MG PO TABS: 20.0000 mg | ORAL_TABLET | Freq: Every day | ORAL | Status: DC

## 2013-05-28 ENCOUNTER — Telehealth: Payer: Self-pay | Admitting: Family Medicine

## 2013-05-28 MED ORDER — TELMISARTAN 80 MG PO TABS: 80.0000 mg | ORAL_TABLET | Freq: Every day | ORAL | Status: DC

## 2013-05-28 NOTE — Telephone Encounter (Signed)
Patient is requesting a 30 day supply of micardis to be sent to [facility] on [address].

## 2013-05-28 NOTE — Telephone Encounter (Signed)
Med filled.  

## 2013-06-19 ENCOUNTER — Telehealth: Payer: Self-pay | Admitting: Family Medicine

## 2013-06-19 DIAGNOSIS — N400 Enlarged prostate without lower urinary tract symptoms: Secondary | ICD-10-CM

## 2013-06-19 MED ORDER — METFORMIN HCL 1000 MG PO TABS: 1000.0000 mg | ORAL_TABLET | Freq: Two times a day (BID) | ORAL | Status: DC

## 2013-06-19 MED ORDER — UROXATRAL 10 MG PO TB24: 10.0000 mg | ORAL_TABLET | Freq: Every day | ORAL | Status: DC

## 2013-06-19 NOTE — Addendum Note (Signed)
Addended by: Arnette Norris on: 06/19/2013 04:01 PM   Modules accepted: Orders

## 2013-06-19 NOTE — Telephone Encounter (Signed)
Patient came in requesting Uroxatral 10. He wants a 30 day supply to [facility] and a 90 day supply to be sent to [facility].    KP

## 2013-06-19 NOTE — Telephone Encounter (Signed)
Pt would like a refill on his metFORMIN (GLUCOPHAGE) 1000 MG tablet  pharmacy [id number] - [place] - [address]  Thanks

## 2013-06-24 ENCOUNTER — Telehealth: Payer: Self-pay | Admitting: Family Medicine

## 2013-06-24 MED ORDER — PRAVASTATIN SODIUM 20 MG PO TABS: 20.0000 mg | ORAL_TABLET | Freq: Every day | ORAL | Status: DC

## 2013-06-24 NOTE — Telephone Encounter (Signed)
Refill: Pravastatin 20mg  tab #30. Take one tablet by mouth once daily. Last fill 06-10-13

## 2013-07-14 ENCOUNTER — Other Ambulatory Visit: Payer: Self-pay | Admitting: Family Medicine

## 2013-07-28 ENCOUNTER — Other Ambulatory Visit: Payer: Self-pay

## 2013-07-28 DIAGNOSIS — N400 Enlarged prostate without lower urinary tract symptoms: Secondary | ICD-10-CM

## 2013-07-28 MED ORDER — FINASTERIDE 5 MG PO TABS: ORAL_TABLET | ORAL | Status: DC

## 2013-07-28 MED ORDER — UROXATRAL 10 MG PO TB24: 10.0000 mg | ORAL_TABLET | Freq: Every day | ORAL | Status: DC

## 2013-08-04 ENCOUNTER — Other Ambulatory Visit: Payer: Self-pay | Admitting: *Deleted

## 2013-08-04 ENCOUNTER — Other Ambulatory Visit: Payer: Self-pay | Admitting: Family Medicine

## 2013-08-04 MED ORDER — LEVOTHYROXINE SODIUM 75 MCG PO TABS: ORAL_TABLET | ORAL | Status: DC

## 2013-08-04 NOTE — Telephone Encounter (Signed)
Rx has been refilled for synthroid 75 mcg.  AG cma

## 2013-08-06 ENCOUNTER — Ambulatory Visit (INDEPENDENT_AMBULATORY_CARE_PROVIDER_SITE_OTHER): Payer: Medicare Other | Admitting: Family Medicine

## 2013-08-06 ENCOUNTER — Encounter: Payer: Self-pay | Admitting: Family Medicine

## 2013-08-06 VITALS — BP 140/72 | HR 93 | Temp 98.3°F | Ht 67.0 in | Wt 201.4 lb

## 2013-08-06 DIAGNOSIS — M62830 Muscle spasm of back: Secondary | ICD-10-CM | POA: Insufficient documentation

## 2013-08-06 DIAGNOSIS — M5380 Other specified dorsopathies, site unspecified: Secondary | ICD-10-CM | POA: Diagnosis not present

## 2013-08-06 HISTORY — DX: Muscle spasm of back: M62.830

## 2013-08-06 NOTE — Assessment & Plan Note (Signed)
New.  No TTP over spine.  + muscle spasm.  Pain is improving w/ heat.  No scheduled NSAIDs due to age.  Tylenol prn.  Reviewed supportive care and red flags that should prompt return.  Pt expressed understanding and is in agreement w/ plan.

## 2013-08-06 NOTE — Patient Instructions (Addendum)
This appears to be a muscle spasm Continue the heating pad or patches for spasm relief Tylenol as needed for pain Remain active to avoid stiffness Call with any questions or concerns Hang in there!!!

## 2013-08-06 NOTE — Progress Notes (Signed)
  Subjective:    Patient ID: Douglas Grant, male    DOB: 13-Apr-1930, 79 y.o.   MRN: [id number]  HPI Back pain- fell out of bed 3 days ago, hitting R hip but 'my back started hurting like crazy'.  Pain is thoracic across the back in a bandlike pattern.  Pt put icyhot patch on back w/ good relief.  Feels better today than yesterday- was unable to sneeze or cough w/out pain yesterday.  No pain w/ deep breath.    Review of Systems For ROS see HPI     Objective:   Physical Exam  Vitals reviewed. Constitutional: He appears well-developed and well-nourished. No distress.  Musculoskeletal: Normal range of motion.  (-) SLR bilaterally Good flexion/extension of back + TTP over thoracic paraspinal muscles No TTP over spine  Neurological: He is alert. He has normal reflexes. Coordination normal.          Assessment & Plan:

## 2013-08-14 DIAGNOSIS — M546 Pain in thoracic spine: Secondary | ICD-10-CM | POA: Diagnosis not present

## 2013-08-28 ENCOUNTER — Telehealth: Payer: Self-pay | Admitting: Family Medicine

## 2013-08-28 DIAGNOSIS — K219 Gastro-esophageal reflux disease without esophagitis: Secondary | ICD-10-CM

## 2013-08-28 MED ORDER — ESOMEPRAZOLE MAGNESIUM 40 MG PO CPDR: 40.0000 mg | DELAYED_RELEASE_CAPSULE | Freq: Every day | ORAL | Status: DC

## 2013-08-28 MED ORDER — METFORMIN HCL 1000 MG PO TABS: 1000.0000 mg | ORAL_TABLET | Freq: Two times a day (BID) | ORAL | Status: DC

## 2013-08-28 MED ORDER — FINASTERIDE 5 MG PO TABS: ORAL_TABLET | ORAL | Status: DC

## 2013-08-28 MED ORDER — TELMISARTAN 80 MG PO TABS: 80.0000 mg | ORAL_TABLET | Freq: Every day | ORAL | Status: DC

## 2013-08-28 MED ORDER — PRAVASTATIN SODIUM 20 MG PO TABS: 20.0000 mg | ORAL_TABLET | Freq: Every day | ORAL | Status: DC

## 2013-08-28 MED ORDER — LEVOTHYROXINE SODIUM 75 MCG PO TABS: ORAL_TABLET | ORAL | Status: DC

## 2013-08-28 NOTE — Telephone Encounter (Signed)
08/28/2013  Douglas Grant came by the office.  Having trouble with [facility] filling his Nexium.  Last week they told him it would be over $200.  This week they said it would be $70.  He did not pick it up.  He request that we refill all his medications for 3 months for him to take to [facility]. [place] at this time.  He said that way they will all run out and have to be refilled at the same time.  Please call him with questions or when they are ready to be picked up.  bw

## 2013-08-28 NOTE — Telephone Encounter (Signed)
Med's printed and left for the patient to pick up.       KP

## 2013-09-17 ENCOUNTER — Other Ambulatory Visit: Payer: Self-pay | Admitting: General Practice

## 2013-09-17 DIAGNOSIS — N400 Enlarged prostate without lower urinary tract symptoms: Secondary | ICD-10-CM

## 2013-09-17 MED ORDER — UROXATRAL 10 MG PO TB24: 10.0000 mg | ORAL_TABLET | Freq: Every day | ORAL | Status: DC

## 2013-10-08 ENCOUNTER — Ambulatory Visit (INDEPENDENT_AMBULATORY_CARE_PROVIDER_SITE_OTHER): Payer: Medicare Other

## 2013-10-08 DIAGNOSIS — Z23 Encounter for immunization: Secondary | ICD-10-CM | POA: Diagnosis not present

## 2013-11-13 ENCOUNTER — Telehealth: Payer: Self-pay

## 2013-11-13 NOTE — Telephone Encounter (Signed)
Medication List and allergies: reviewed and updated  90 day supply/mail order: na Local prescriptions: [facility] [address]  Immunizations due: UTD  A/P:   No changes to FH or PSH No PSA on file  Health Maintenance  Topic Date Due  . Zostavax  09/01/1992  . Colonoscopy  08/09/2013  . Influenza Vaccine  07/24/2014  . Tetanus/tdap  06/27/2020  . Pneumococcal Polysaccharide Vaccine Age 77 And Over  Completed   To Discuss with Provider: Not at this time

## 2013-11-16 ENCOUNTER — Encounter: Payer: Self-pay | Admitting: Family Medicine

## 2013-11-16 ENCOUNTER — Ambulatory Visit (INDEPENDENT_AMBULATORY_CARE_PROVIDER_SITE_OTHER): Payer: Medicare Other | Admitting: Family Medicine

## 2013-11-16 VITALS — BP 128/70 | HR 92 | Temp 98.5°F | Ht 67.0 in | Wt 200.2 lb

## 2013-11-16 DIAGNOSIS — E669 Obesity, unspecified: Secondary | ICD-10-CM

## 2013-11-16 DIAGNOSIS — E119 Type 2 diabetes mellitus without complications: Secondary | ICD-10-CM

## 2013-11-16 DIAGNOSIS — Z0000 Encounter for general adult medical examination without abnormal findings: Secondary | ICD-10-CM

## 2013-11-16 DIAGNOSIS — I10 Essential (primary) hypertension: Secondary | ICD-10-CM | POA: Diagnosis not present

## 2013-11-16 DIAGNOSIS — E1159 Type 2 diabetes mellitus with other circulatory complications: Secondary | ICD-10-CM | POA: Diagnosis not present

## 2013-11-16 DIAGNOSIS — N400 Enlarged prostate without lower urinary tract symptoms: Secondary | ICD-10-CM | POA: Diagnosis not present

## 2013-11-16 DIAGNOSIS — E785 Hyperlipidemia, unspecified: Secondary | ICD-10-CM

## 2013-11-16 HISTORY — DX: Obesity, unspecified: E66.9

## 2013-11-16 NOTE — Progress Notes (Signed)
Subjective:    Douglas Grant is a 79 y.o. male who presents for Medicare Annual/Subsequent preventive examination.   Preventive Screening-Counseling & Management  Tobacco History  Smoking status  . Former Smoker  . Quit date: 04/18/1985  Smokeless tobacco  . Never Used    Problems Prior to Visit 1.   Current Problems (verified) Patient Active Problem List   Diagnosis Date Noted  . Obesity (BMI 30-39.9) 11/16/2013  . Paraspinal muscle spasm 08/06/2013  . Stroke 04/24/2011  . B12 deficiency 04/24/2011  . BACK PAIN 07/28/2009  . HEADACHE 02/18/2009  . CANDIDIASIS OF OTHER UROGENITAL SITES 07/13/2008  . CERVICALGIA 06/15/2008  . PRURITUS 11/25/2007  . OTHER URINARY INCONTINENCE 11/25/2007  . BENIGN PROSTATIC HYPERTROPHY, HX OF 11/25/2007  . HIP PAIN, LEFT 11/10/2007  . HYPOTHYROIDISM 08/21/2007  . OBESITY 08/21/2007  . ATHEROSCLEROSIS, CORONARY, NATIVE ARTERY 08/21/2007  . VENTRICULAR HYPERTROPHY, LEFT 08/21/2007  . GERD 08/21/2007  . ERECTILE DYSFUNCTION, ORGANIC 08/21/2007  . COLONIC POLYPS, HX OF 08/21/2007  . SPONDYLOSIS, CERVICAL, WITH RADICULOPATHY 08/11/2007  . Carpal Tunnel Syndrome 07/28/2007  . SPRAIN/STRAIN, SHOULDER/ARM NEC 06/26/2007  . DIABETES MELLITUS, TYPE II 02/10/2007  . HYPERLIPIDEMIA 02/10/2007  . HYPERTENSION 02/10/2007    Medications Prior to Visit Current Outpatient Prescriptions on File Prior to Visit  Medication Sig Dispense Refill  . esomeprazole (NEXIUM) 40 MG capsule Take 1 capsule (40 mg total) by mouth daily.  90 capsule  1  . finasteride (PROSCAR) 5 MG tablet TAKE ONE TABLET BY MOUTH EVERY DAY  90 tablet  1  . levothyroxine (SYNTHROID, LEVOTHROID) 75 MCG tablet 1 tab by mouth daily  90 tablet  1  . metFORMIN (GLUCOPHAGE) 1000 MG tablet Take 1 tablet (1,000 mg total) by mouth 2 (two) times daily with a meal.  180 tablet  1  . pravastatin (PRAVACHOL) 20 MG tablet Take 1 tablet (20 mg total) by mouth daily.  90 tablet  1  .  telmisartan (MICARDIS) 80 MG tablet Take 1 tablet (80 mg total) by mouth daily.  90 tablet  1  . UROXATRAL 10 MG 24 hr tablet Take 1 tablet (10 mg total) by mouth daily.  90 tablet  0   No current facility-administered medications on file prior to visit.    Current Medications (verified) Current Outpatient Prescriptions  Medication Sig Dispense Refill  . esomeprazole (NEXIUM) 40 MG capsule Take 1 capsule (40 mg total) by mouth daily.  90 capsule  1  . finasteride (PROSCAR) 5 MG tablet TAKE ONE TABLET BY MOUTH EVERY DAY  90 tablet  1  . levothyroxine (SYNTHROID, LEVOTHROID) 75 MCG tablet 1 tab by mouth daily  90 tablet  1  . metFORMIN (GLUCOPHAGE) 1000 MG tablet Take 1 tablet (1,000 mg total) by mouth 2 (two) times daily with a meal.  180 tablet  1  . pravastatin (PRAVACHOL) 20 MG tablet Take 1 tablet (20 mg total) by mouth daily.  90 tablet  1  . telmisartan (MICARDIS) 80 MG tablet Take 1 tablet (80 mg total) by mouth daily.  90 tablet  1  . UROXATRAL 10 MG 24 hr tablet Take 1 tablet (10 mg total) by mouth daily.  90 tablet  0   No current facility-administered medications for this visit.     Allergies (verified) Atenolol; Clonidine hydrochloride; Prednisone; and Verapamil   PAST HISTORY  Family History Family History  Problem Relation Age of Onset  . Depression Mother   . Alcohol abuse Father   .  Alcohol abuse Brother     Social History History  Substance Use Topics  . Smoking status: Former Smoker    Quit date: 04/18/1985  . Smokeless tobacco: Never Used  . Alcohol Use: No    Are there smokers in your home (other than you)?  No  Risk Factors Current exercise habits: The patient does not participate in regular exercise at present. works one day a week  Dietary issues discussed: na   Cardiac risk factors: advanced age (older than 64 for men, 56 for women), diabetes mellitus, dyslipidemia, hypertension, male gender and sedentary lifestyle.  Depression Screen (Note:  if answer to either of the following is "Yes", a more complete depression screening is indicated)   Q1: Over the past two weeks, have you felt down, depressed or hopeless? No  Q2: Over the past two weeks, have you felt little interest or pleasure in doing things? No  Have you lost interest or pleasure in daily life? No  Do you often feel hopeless? No  Do you cry easily over simple problems? No  Activities of Daily Living In your present state of health, do you have any difficulty performing the following activities?:  Driving? No Managing money?  No Feeding yourself? No Getting from bed to chair? No Climbing a flight of stairs? No Preparing food and eating?: No Bathing or showering? No Getting dressed: No Getting to the toilet? No Using the toilet:No Moving around from place to place: No In the past year have you fallen or had a near fall?:No   Are you sexually active?  No  Do you have more than one partner?  No  Hearing Difficulties: No Do you often ask people to speak up or repeat themselves? No Do you experience ringing or noises in your ears? No Do you have difficulty understanding soft or whispered voices? No   Do you feel that you have a problem with memory? No  Do you often misplace items? No  Do you feel safe at home?  No  Cognitive Testing  Alert? Yes  Normal Appearance?Yes  Oriented to person? Yes  Place? Yes   Time? Yes  Recall of three objects?  Yes  Can perform simple calculations? Yes  Displays appropriate judgment?Yes  Can read the correct time from a watch face?Yes   Advanced Directives have been discussed with the patient? Yes   List the Names of Other Physician/Practitioners you currently use: 1.  [email or web address]   Indicate any recent Medical Services you may have received from other than [facility] providers in the past year (date may be approximate).  Immunization History  Administered Date(s) Administered  . Influenza Split 11/27/2011, 10/21/2012  .  Influenza Whole 10/09/2005, 11/10/2007, 09/20/2008, 10/10/2009, 08/21/2010  . Influenza, High Dose Seasonal PF 10/08/2013  . Pneumococcal Polysaccharide-23 05/21/2006  . Td 06/04/2005, 06/27/2010    Screening Tests Health Maintenance  Topic Date Due  . Foot Exam  [birth year]01/1940  . Zostavax  09/01/1992  . Ophthalmology Exam  02/11/2013  . Urine Microalbumin  02/27/2013  . Colonoscopy  08/09/2013  . Hemoglobin A1c  08/19/2013  . Influenza Vaccine  07/24/2014  . Tetanus/tdap  06/27/2020  . Pneumococcal Polysaccharide Vaccine Age 87 And Over  Completed    All answers were reviewed with the patient and necessary referrals were made:  Loreen Freud, DO   11/16/2013   History reviewed:  He  has a past medical history of Diabetes mellitus; Hyperlipidemia; GERD (gastroesophageal reflux disease); Thyroid disease; Hypertension; Heart murmur; Stroke (  03/2011); and Personal history of colonic polyps. He  does not have any pertinent problems on file. He  has past surgical history that includes Appendectomy. His family history includes Alcohol abuse in his brother and father; Depression in his mother. He  reports that he quit smoking about 28 years ago. He has never used smokeless tobacco. He reports that he does not drink alcohol or use illicit drugs. He has a current medication list which includes the following prescription(s): esomeprazole, finasteride, levothyroxine, metformin, pravastatin, telmisartan, and uroxatral. Current Outpatient Prescriptions on File Prior to Visit  Medication Sig Dispense Refill  . esomeprazole (NEXIUM) 40 MG capsule Take 1 capsule (40 mg total) by mouth daily.  90 capsule  1  . finasteride (PROSCAR) 5 MG tablet TAKE ONE TABLET BY MOUTH EVERY DAY  90 tablet  1  . levothyroxine (SYNTHROID, LEVOTHROID) 75 MCG tablet 1 tab by mouth daily  90 tablet  1  . metFORMIN (GLUCOPHAGE) 1000 MG tablet Take 1 tablet (1,000 mg total) by mouth 2 (two) times daily with a meal.  180 tablet   1  . pravastatin (PRAVACHOL) 20 MG tablet Take 1 tablet (20 mg total) by mouth daily.  90 tablet  1  . telmisartan (MICARDIS) 80 MG tablet Take 1 tablet (80 mg total) by mouth daily.  90 tablet  1  . UROXATRAL 10 MG 24 hr tablet Take 1 tablet (10 mg total) by mouth daily.  90 tablet  0   No current facility-administered medications on file prior to visit.   He is allergic to atenolol; clonidine hydrochloride; prednisone; and verapamil.  Review of Systems  Review of Systems  Constitutional: Negative for activity change, appetite change and fatigue.  HENT: Negative for hearing loss, congestion, tinnitus and ear discharge.   Eyes: Negative for visual disturbance (see optho q1y -- vision corrected to 20/20 with glasses).  Respiratory: Negative for cough, chest tightness and shortness of breath.   Cardiovascular: Negative for chest pain, palpitations and leg swelling.  Gastrointestinal: Negative for abdominal pain, diarrhea, constipation and abdominal distention.  Genitourinary: Negative for urgency, frequency, decreased urine volume and difficulty urinating.  Musculoskeletal: Negative for back pain, arthralgias and gait problem.  Skin: Negative for color change, pallor and rash.  Neurological: Negative for dizziness, light-headedness, numbness and headaches.  Hematological: Negative for adenopathy. Does not bruise/bleed easily.  Psychiatric/Behavioral: Negative for suicidal ideas, confusion, sleep disturbance, self-injury, dysphoric mood, decreased concentration and agitation.  Pt is able to read and write and can do all ADLs No risk for falling No abuse/ violence in home      Objective:     Vision by Snellen chart: opth Blood pressure 128/70, pulse 92, temperature 98.5 F (36.9 C), temperature source Tympanic, height 5\' 7"  (1.702 m), weight 200 lb 3.2 oz (90.81 kg), SpO2 94.00%. Body mass index is 31.35 kg/(m^2).  BP 128/70  Pulse 92  Temp(Src) 98.5 F (36.9 C) (Tympanic)  Ht  5\' 7"  (1.702 m)  Wt 200 lb 3.2 oz (90.81 kg)  BMI 31.35 kg/m2  SpO2 94% General appearance: alert, cooperative, appears stated age and no distress Head: Normocephalic, without obvious abnormality, atraumatic Eyes: negative findings: lids and lashes normal and pupils equal, round, reactive to light and accomodation Ears: normal TM's and external ear canals both ears Nose: Nares normal. Septum midline. Mucosa normal. No drainage or sinus tenderness. Throat: lips, mucosa, and tongue normal; teeth and gums normal Neck: no adenopathy, no carotid bruit, no JVD, supple, symmetrical, trachea midline and  thyroid not enlarged, symmetric, no tenderness/mass/nodules Back: symmetric, no curvature. ROM normal. No CVA tenderness. Lungs: clear to auscultation bilaterally Chest wall: no tenderness Heart: regular rate and rhythm, S1, S2 normal, no murmur, click, rub or gallop Abdomen: soft, non-tender; bowel sounds normal; no masses,  no organomegaly Male genitalia: normal, penis: no lesions or discharge. testes: no masses or tenderness. no hernias Rectal: normal tone, normal prostate, no masses or tenderness and soft brown guaiac negative stool noted Extremities: extremities normal, atraumatic, no cyanosis or edema Pulses: 2+ and symmetric Skin: Skin color, texture, turgor normal. No rashes or lesions Lymph nodes: Cervical, supraclavicular, and axillary nodes normal. Neurologic: Alert and oriented X 3, normal strength and tone. Normal symmetric reflexes. Normal coordination and gait Psych--  No depression, no axiety      Assessment:     cpe      Plan:     During the course of the visit the patient was educated and counseled about appropriate screening and preventive services including:    Pneumococcal vaccine   Influenza vaccine  Td vaccine  Prostate cancer screening  Colorectal cancer screening  Glaucoma screening  Advanced directives: has NO advanced directive - not interested in  additional information  Diet review for nutrition referral? Yes ____  Not Indicated _x___   Patient Instructions (the written plan) was given to the patient.  Medicare Attestation I have personally reviewed: The patient's medical and social history Their use of alcohol, tobacco or illicit drugs Their current medications and supplements The patient's functional ability including ADLs,fall risks, home safety risks, cognitive, and hearing and visual impairment Diet and physical activities Evidence for depression or mood disorders  The patient's weight, height, BMI, and visual acuity have been recorded in the chart.  I have made referrals, counseling, and provided education to the patient based on review of the above and I have provided the patient with a written personalized care plan for preventive services.     Loreen Freud, DO   11/16/2013

## 2013-11-16 NOTE — Assessment & Plan Note (Signed)
Check labs con't meds 

## 2013-11-16 NOTE — Progress Notes (Signed)
Pre visit review using our clinic review tool, if applicable. No additional management support is needed unless otherwise documented below in the visit note. 

## 2013-11-16 NOTE — Assessment & Plan Note (Signed)
stable °

## 2013-11-16 NOTE — Patient Instructions (Signed)
 Preventive Care for Adults, Male A healthy lifestyle and preventive care can promote health and wellness. Preventive health guidelines for men include the following key practices:  A routine yearly physical is a good way to check with your caregiver about your health and preventative screening. It is a chance to share any concerns and updates on your health, and to receive a thorough exam.  Visit your dentist for a routine exam and preventative care every 6 months. Brush your teeth twice a day and floss once a day. Good oral hygiene prevents tooth decay and gum disease.  The frequency of eye exams is based on your age, health, family medical history, use of contact lenses, and other factors. Follow your caregiver's recommendations for frequency of eye exams.  Eat a healthy diet. Foods like vegetables, fruits, whole grains, low-fat dairy products, and lean protein foods contain the nutrients you need without too many calories. Decrease your intake of foods high in solid fats, added sugars, and salt. Eat the right amount of calories for you.Get information about a proper diet from your caregiver, if necessary.  Regular physical exercise is one of the most important things you can do for your health. Most adults should get at least 150 minutes of moderate-intensity exercise (any activity that increases your heart rate and causes you to sweat) each week. In addition, most adults need muscle-strengthening exercises on 2 or more days a week.  Maintain a healthy weight. The body mass index (BMI) is a screening tool to identify possible weight problems. It provides an estimate of body fat based on height and weight. Your caregiver can help determine your BMI, and can help you achieve or maintain a healthy weight.For adults 20 years and older:  A BMI below 18.5 is considered underweight.  A BMI of 18.5 to 24.9 is normal.  A BMI of 25 to 29.9 is considered overweight.  A BMI of 30 and above is  considered obese.  Maintain normal blood lipids and cholesterol levels by exercising and minimizing your intake of saturated fat. Eat a balanced diet with plenty of fruit and vegetables. Blood tests for lipids and cholesterol should begin at age 20 and be repeated every 5 years. If your lipid or cholesterol levels are high, you are over 50, or you are a high risk for heart disease, you may need your cholesterol levels checked more frequently.Ongoing high lipid and cholesterol levels should be treated with medicines if diet and exercise are not effective.  If you smoke, find out from your caregiver how to quit. If you do not use tobacco, do not start.  Lung cancer screening is recommended for adults aged 55 80 years who are at high risk for developing lung cancer because of a history of smoking. Yearly low-dose computed tomography (CT) is recommended for people who have at least a 30-pack-year history of smoking and are a current smoker or have quit within the past 15 years. A pack year of smoking is smoking an average of 1 pack of cigarettes a day for 1 year (for example: 1 pack a day for 30 years or 2 packs a day for 15 years). Yearly screening should continue until the smoker has stopped smoking for at least 15 years. Yearly screening should also be stopped for people who develop a health problem that would prevent them from having lung cancer treatment.  If you choose to drink alcohol, do not exceed 2 drinks per day. One drink is considered to be 12   ounces (355 mL) of beer, 5 ounces (148 mL) of wine, or 1.5 ounces (44 mL) of liquor.  Avoid use of street drugs. Do not share needles with anyone. Ask for help if you need support or instructions about stopping the use of drugs.  High blood pressure causes heart disease and increases the risk of stroke. Your blood pressure should be checked at least every 1 to 2 years. Ongoing high blood pressure should be treated with medicines, if weight loss and  exercise are not effective.  If you are 45 to 77 years old, ask your caregiver if you should take aspirin to prevent heart disease.  Diabetes screening involves taking a blood sample to check your fasting blood sugar level. This should be done once every 3 years, after age 45, if you are within normal weight and without risk factors for diabetes. Testing should be considered at a younger age or be carried out more frequently if you are overweight and have at least 1 risk factor for diabetes.  Colorectal cancer can be detected and often prevented. Most routine colorectal cancer screening begins at the age of 50 and continues through age 75. However, your caregiver may recommend screening at an earlier age if you have risk factors for colon cancer. On a yearly basis, your caregiver may provide home test kits to check for hidden blood in the stool. Use of a small camera at the end of a tube, to directly examine the colon (sigmoidoscopy or colonoscopy), can detect the earliest forms of colorectal cancer. Talk to your caregiver about this at age 50, when routine screening begins. Direct examination of the colon should be repeated every 5 to 10 years through age 75, unless early forms of pre-cancerous polyps or small growths are found.  Hepatitis C blood testing is recommended for all people born from 1945 through 1965 and any individual with known risks for hepatitis C.  Practice safe sex. Use condoms and avoid high-risk sexual practices to reduce the spread of sexually transmitted infections (STIs). STIs include gonorrhea, chlamydia, syphilis, trichomonas, herpes, HPV, and human immunodeficiency virus (HIV). Herpes, HIV, and HPV are viral illnesses that have no cure. They can result in disability, cancer, and death.  A one-time screening for abdominal aortic aneurysm (AAA) and surgical repair of large AAAs by sound wave imaging (ultrasonography) is recommended for ages 65 to 75 years who are current or  former smokers.  Healthy men should no longer receive prostate-specific antigen (PSA) blood tests as part of routine cancer screening. Consult with your caregiver about prostate cancer screening.  Testicular cancer screening is not recommended for adult males who have no symptoms. Screening includes self-exam, caregiver exam, and other screening tests. Consult with your caregiver about any symptoms you have or any concerns you have about testicular cancer.  Use sunscreen. Apply sunscreen liberally and repeatedly throughout the day. You should seek shade when your shadow is shorter than you. Protect yourself by wearing long sleeves, pants, a wide-brimmed hat, and sunglasses year round, whenever you are outdoors.  Once a month, do a whole body skin exam, using a mirror to look at the skin on your back. Notify your caregiver of new moles, moles that have irregular borders, moles that are larger than a pencil eraser, or moles that have changed in shape or color.  Stay current with required immunizations.  Influenza vaccine. All adults should be immunized every year.  Tetanus, diphtheria, and acellular pertussis (Td, Tdap) vaccine. An adult who has not   previously received Tdap or who does not know his vaccine status should receive 1 dose of Tdap. This initial dose should be followed by tetanus and diphtheria toxoids (Td) booster doses every 10 years. Adults with an unknown or incomplete history of completing a 3-dose immunization series with Td-containing vaccines should begin or complete a primary immunization series including a Tdap dose. Adults should receive a Td booster every 10 years.  Varicella vaccine. An adult without evidence of immunity to varicella should receive 2 doses or a second dose if he has previously received 1 dose.  Human papillomavirus (HPV) vaccine. Males aged 13 21 years who have not received the vaccine previously should receive the 3-dose series. Males aged 22 26 years may be  immunized. Immunization is recommended through the age of 26 years for any male who has sex with males and did not get any or all doses earlier. Immunization is recommended for any person with an immunocompromised condition through the age of 26 years if he did not get any or all doses earlier. During the 3-dose series, the second dose should be obtained 4 8 weeks after the first dose. The third dose should be obtained 24 weeks after the first dose and 16 weeks after the second dose.  Zoster vaccine. One dose is recommended for adults aged 60 years or older unless certain conditions are present.  Measles, mumps, and rubella (MMR) vaccine. Adults born before 1957 generally are considered immune to measles and mumps. Adults born in 1957 or later should have 1 or more doses of MMR vaccine unless there is a contraindication to the vaccine or there is laboratory evidence of immunity to each of the three diseases. A routine second dose of MMR vaccine should be obtained at least 28 days after the first dose for students attending postsecondary schools, health care workers, or international travelers. People who received inactivated measles vaccine or an unknown type of measles vaccine during 1963 1967 should receive 2 doses of MMR vaccine. People who received inactivated mumps vaccine or an unknown type of mumps vaccine before 1979 and are at high risk for mumps infection should consider immunization with 2 doses of MMR vaccine. Unvaccinated health care workers born before 1957 who lack laboratory evidence of measles, mumps, or rubella immunity or laboratory confirmation of disease should consider measles and mumps immunization with 2 doses of MMR vaccine or rubella immunization with 1 dose of MMR vaccine.  Pneumococcal 13-valent conjugate (PCV13) vaccine. When indicated, a person who is uncertain of his immunization history and has no record of immunization should receive the PCV13 vaccine. An adult aged 19 years or  older who has certain medical conditions and has not been previously immunized should receive 1 dose of PCV13 vaccine. This PCV13 should be followed with a dose of pneumococcal polysaccharide (PPSV23) vaccine. The PPSV23 vaccine dose should be obtained at least 8 weeks after the dose of PCV13 vaccine. An adult aged 19 years or older who has certain medical conditions and previously received 1 or more doses of PPSV23 vaccine should receive 1 dose of PCV13. The PCV13 vaccine dose should be obtained 1 or more years after the last PPSV23 vaccine dose.  Pneumococcal polysaccharide (PPSV23) vaccine. When PCV13 is also indicated, PCV13 should be obtained first. All adults aged 65 years and older should be immunized. An adult younger than age 65 years who has certain medical conditions should be immunized. Any person who resides in a nursing home or long-term care facility should be   immunized. An adult smoker should be immunized. People with an immunocompromised condition and certain other conditions should receive both PCV13 and PPSV23 vaccines. People with human immunodeficiency virus (HIV) infection should be immunized as soon as possible after diagnosis. Immunization during chemotherapy or radiation therapy should be avoided. Routine use of PPSV23 vaccine is not recommended for American Indians, [place] Natives, or people younger than 65 years unless there are medical conditions that require PPSV23 vaccine. When indicated, people who have unknown immunization and have no record of immunization should receive PPSV23 vaccine. One-time revaccination 5 years after the first dose of PPSV23 is recommended for people aged 19 64 years who have chronic kidney failure, nephrotic syndrome, asplenia, or immunocompromised conditions. People who received 1 2 doses of PPSV23 before age 65 years should receive another dose of PPSV23 vaccine at age 65 years or later if at least 5 years have passed since the previous dose. Doses of  PPSV23 are not needed for people immunized with PPSV23 at or after age 65 years.  Meningococcal vaccine. Adults with asplenia or persistent complement component deficiencies should receive 2 doses of quadrivalent meningococcal conjugate (MenACWY-D) vaccine. The doses should be obtained at least 2 months apart. Microbiologists working with certain meningococcal bacteria, military recruits, people at risk during an outbreak, and people who travel to or live in countries with a high rate of meningitis should be immunized. A first-year college student up through age 21 years who is living in a residence hall should receive a dose if he did not receive a dose on or after his 16th birthday. Adults who have certain high-risk conditions should receive one or more doses of vaccine.  Hepatitis A vaccine. Adults who wish to be protected from this disease, have certain high-risk conditions, work with hepatitis A-infected animals, work in hepatitis A research labs, or travel to or work in countries with a high rate of hepatitis A should be immunized. Adults who were previously unvaccinated and who anticipate close contact with an international adoptee during the first 60 days after arrival in the [place] from a country with a high rate of hepatitis A should be immunized.  Hepatitis B vaccine. Adults who wish to be protected from this disease, have certain high-risk conditions, may be exposed to blood or other infectious body fluids, are household contacts or sex partners of hepatitis B positive people, are clients or workers in certain care facilities, or travel to or work in countries with a high rate of hepatitis B should be immunized.  Haemophilus influenzae type b (Hib) vaccine. A previously unvaccinated person with asplenia or sickle cell disease or having a scheduled splenectomy should receive 1 dose of Hib vaccine. Regardless of previous immunization, a recipient of a hematopoietic stem cell transplant  should receive a 3-dose series 6 12 months after his successful transplant. Hib vaccine is not recommended for adults with HIV infection. Preventive Service / Frequency Ages 19 to 39  Blood pressure check.** / Every 1 to 2 years.  Lipid and cholesterol check.** / Every 5 years beginning at age 20.  Hepatitis C blood test.** / For any individual with known risks for hepatitis C.  Skin self-exam. / Monthly.  Influenza vaccine. / Every year.  Tetanus, diphtheria, and acellular pertussis (Tdap, Td) vaccine.** / Consult your caregiver. 1 dose of Td every 10 years.  Varicella vaccine.** / Consult your caregiver.  HPV vaccine. / 3 doses over 6 months, if 26 and younger.  Measles, mumps, rubella (MMR) vaccine.** /   You need at least 1 dose of MMR if you were born in 1957 or later. You may also need a 2nd dose.  Pneumococcal 13-valent conjugate (PCV13) vaccine.** / Consult your caregiver.  Pneumococcal polysaccharide (PPSV23) vaccine.** / 1 to 2 doses if you smoke cigarettes or if you have certain conditions.  Meningococcal vaccine.** / 1 dose if you are age 19 to 21 years and a first-year college student living in a residence hall, or have one of several medical conditions, you need to get vaccinated against meningococcal disease. You may also need additional booster doses.  Hepatitis A vaccine.** / Consult your caregiver.  Hepatitis B vaccine.** / Consult your caregiver.  Haemophilus influenzae type b (Hib) vaccine.** / Consult your caregiver. Ages 40 to 64  Blood pressure check.** / Every 1 to 2 years.  Lipid and cholesterol check.** / Every 5 years beginning at age 20.  Lung cancer screening. / Every year if you are aged 55 80 years and have a 30-pack-year history of smoking and currently smoke or have quit within the past 15 years. Yearly screening is stopped once you have quit smoking for at least 15 years or develop a health problem that would prevent you from having lung cancer  treatment.  Fecal occult blood test (FOBT) of stool. / Every year beginning at age 50 and continuing until age 75. You may not have to do this test if you get colonoscopy every 10 years.  Flexible sigmoidoscopy** or colonoscopy.** / Every 5 years for a flexible sigmoidoscopy or every 10 years for a colonoscopy beginning at age 50 and continuing until age 75.  Hepatitis C blood test.** / For all people born from 1945 through 1965 and any individual with known risks for hepatitis C.  Skin self-exam. / Monthly.  Influenza vaccine. / Every year.  Tetanus, diphtheria, and acellular pertussis (Tdap/Td) vaccine.** / Consult your caregiver. 1 dose of Td every 10 years.  Varicella vaccine.** / Consult your caregiver.  Zoster vaccine.** / 1 dose for adults aged 60 years or older.  Measles, mumps, rubella (MMR) vaccine.** / You need at least 1 dose of MMR if you were born in 1957 or later. You may also need a 2nd dose.  Pneumococcal 13-valent conjugate (PCV13) vaccine.** / Consult your caregiver.  Pneumococcal polysaccharide (PPSV23) vaccine.** / 1 to 2 doses if you smoke cigarettes or if you have certain conditions.  Meningococcal vaccine.** / Consult your caregiver.  Hepatitis A vaccine.** / Consult your caregiver.  Hepatitis B vaccine.** / Consult your caregiver.  Haemophilus influenzae type b (Hib) vaccine.** / Consult your caregiver. Ages 65 and over  Blood pressure check.** / Every 1 to 2 years.  Lipid and cholesterol check.**/ Every 5 years beginning at age 20.  Lung cancer screening. / Every year if you are aged 55 80 years and have a 30-pack-year history of smoking and currently smoke or have quit within the past 15 years. Yearly screening is stopped once you have quit smoking for at least 15 years or develop a health problem that would prevent you from having lung cancer treatment.  Fecal occult blood test (FOBT) of stool. / Every year beginning at age 50 and continuing until  age 75. You may not have to do this test if you get colonoscopy every 10 years.  Flexible sigmoidoscopy** or colonoscopy.** / Every 5 years for a flexible sigmoidoscopy or every 10 years for a colonoscopy beginning at age 50 and continuing until age 75.  Hepatitis C blood   test.** / For all people born from 1945 through 1965 and any individual with known risks for hepatitis C.  Abdominal aortic aneurysm (AAA) screening.** / A one-time screening for ages 65 to 75 years who are current or former smokers.  Skin self-exam. / Monthly.  Influenza vaccine. / Every year.  Tetanus, diphtheria, and acellular pertussis (Tdap/Td) vaccine.** / 1 dose of Td every 10 years.  Varicella vaccine.** / Consult your caregiver.  Zoster vaccine.** / 1 dose for adults aged 60 years or older.  Pneumococcal 13-valent conjugate (PCV13) vaccine.** / Consult your caregiver.  Pneumococcal polysaccharide (PPSV23) vaccine.** / 1 dose for all adults aged 65 years and older.  Meningococcal vaccine.** / Consult your caregiver.  Hepatitis A vaccine.** / Consult your caregiver.  Hepatitis B vaccine.** / Consult your caregiver.  Haemophilus influenzae type b (Hib) vaccine.** / Consult your caregiver. **Family history and personal history of risk and conditions may change your caregiver's recommendations. Document Released: 02/05/2002 Document Revised: 04/06/2013 Document Reviewed: 05/07/2011 [employer] Patient Information 2014 [employer].  

## 2013-11-17 ENCOUNTER — Other Ambulatory Visit: Payer: Medicare Other

## 2013-12-22 ENCOUNTER — Telehealth: Payer: Self-pay

## 2013-12-22 ENCOUNTER — Ambulatory Visit (INDEPENDENT_AMBULATORY_CARE_PROVIDER_SITE_OTHER): Payer: Medicare Other | Admitting: Internal Medicine

## 2013-12-22 ENCOUNTER — Encounter: Payer: Self-pay | Admitting: Internal Medicine

## 2013-12-22 VITALS — BP 152/78 | HR 89 | Temp 99.0°F | Ht 69.5 in | Wt 193.5 lb

## 2013-12-22 DIAGNOSIS — E119 Type 2 diabetes mellitus without complications: Secondary | ICD-10-CM | POA: Diagnosis not present

## 2013-12-22 DIAGNOSIS — R062 Wheezing: Secondary | ICD-10-CM

## 2013-12-22 DIAGNOSIS — J209 Acute bronchitis, unspecified: Secondary | ICD-10-CM | POA: Diagnosis not present

## 2013-12-22 HISTORY — DX: Wheezing: R06.2

## 2013-12-22 MED ORDER — AZITHROMYCIN 250 MG PO TABS: ORAL_TABLET | ORAL | Status: DC

## 2013-12-22 MED ORDER — PREDNISONE 10 MG PO TABS: ORAL_TABLET | ORAL | Status: DC

## 2013-12-22 MED ORDER — HYDROCODONE-HOMATROPINE 5-1.5 MG/5ML PO SYRP: 5.0000 mL | ORAL_SOLUTION | Freq: Four times a day (QID) | ORAL | Status: DC | PRN

## 2013-12-22 NOTE — Assessment & Plan Note (Signed)
Mild to mod, for medrol dose pack,  to f/u any worsening symptoms or concerns

## 2013-12-22 NOTE — Patient Instructions (Addendum)
Please take all new medication as prescribed - the antibiotic, cough medicine, and Medrol dose pack (a milder steroid medication)  Please disregard the prednisone prescription that was accidentally sent to your pharmacy ([facility])  Please continue all other medications as before, and refills have been done if requested.  Please check your sugars at least once daily on the steroid pill to make sure sugars stay at least < 200  Please have the pharmacy call your PCP with any other refills needed  Please remember to sign up for My Chart if you have not done so, as this will be important to you in the future with finding out test results, communicating by private email, and scheduling acute appointments online when needed.

## 2013-12-22 NOTE — Telephone Encounter (Signed)
Patient informed of MD instructions on medication 

## 2013-12-22 NOTE — Progress Notes (Signed)
Subjective:    Patient ID: Douglas Grant, male    DOB: 01-28-1930, 79 y.o.   MRN: [id number]  HPI Here with acute onset mild to mod 2-3 days ST, HA, general weakness and malaise, with prod cough greenish sputum, but Pt denies chest pain, increased sob or doe, wheezing, orthopnea, PND, increased LE swelling, palpitations, dizziness or syncope. Pt denies new neurological symptoms such as new headache, or facial or extremity weakness or numbness   Pt denies polydipsia, polyuria, or low sugar symptoms such as weakness or confusion improved with po intake.  Pt states overall good compliance with meds, does not check sugars lately at home as had been quite good until recent Past Medical History  Diagnosis Date  . Diabetes mellitus   . Hyperlipidemia   . GERD (gastroesophageal reflux disease)   . Thyroid disease   . Hypertension   . Heart murmur   . Stroke 03/2011    R lat thalamus, post in int capsule  . Personal history of colonic polyps    Past Surgical History  Procedure Laterality Date  . Appendectomy      reports that he quit smoking about 28 years ago. He has never used smokeless tobacco. He reports that he does not drink alcohol or use illicit drugs. family history includes Alcohol abuse in his brother and father; Depression in his mother. Allergies  Allergen Reactions  . Atenolol     REACTION: dyspnea  . Clonidine Hydrochloride     REACTION: dry mouth  . Prednisone     REACTION: Hair loss  . Verapamil     REACTION: hallucinations   Current Outpatient Prescriptions on File Prior to Visit  Medication Sig Dispense Refill  . esomeprazole (NEXIUM) 40 MG capsule Take 1 capsule (40 mg total) by mouth daily.  90 capsule  1  . finasteride (PROSCAR) 5 MG tablet TAKE ONE TABLET BY MOUTH EVERY DAY  90 tablet  1  . levothyroxine (SYNTHROID, LEVOTHROID) 75 MCG tablet 1 tab by mouth daily  90 tablet  1  . metFORMIN (GLUCOPHAGE) 1000 MG tablet Take 1 tablet (1,000 mg total) by mouth 2 (two)  times daily with a meal.  180 tablet  1  . pravastatin (PRAVACHOL) 20 MG tablet Take 1 tablet (20 mg total) by mouth daily.  90 tablet  1  . telmisartan (MICARDIS) 80 MG tablet Take 1 tablet (80 mg total) by mouth daily.  90 tablet  1  . UROXATRAL 10 MG 24 hr tablet Take 1 tablet (10 mg total) by mouth daily.  90 tablet  0   No current facility-administered medications on file prior to visit.    Review of Systems  Constitutional: Negative for unexpected weight change, or unusual diaphoresis  HENT: Negative for tinnitus.   Eyes: Negative for photophobia and visual disturbance.  Respiratory: Negative for choking and stridor.   Gastrointestinal: Negative for vomiting and blood in stool.  Genitourinary: Negative for hematuria and decreased urine volume.  Musculoskeletal: Negative for acute joint swelling Skin: Negative for color change and wound.  Neurological: Negative for tremors and numbness other than noted  Psychiatric/Behavioral: Negative for decreased concentration or  hyperactivity.       Objective:   Physical Exam BP 152/78  Pulse 89  Temp(Src) 99 F (37.2 C) (Oral)  Ht 5' 9.5" (1.765 m)  Wt 193 lb 8 oz (87.771 kg)  BMI 28.17 kg/m2  SpO2 95% VS noted, mild ill Constitutional: Pt appears well-developed and well-nourished.  HENT:  Head: NCAT.  Right Ear: External ear normal.  Left Ear: External ear normal.  Bilat tm's with no erythema.  Max sinus areas non tender.  Pharynx with mild erythema, no exudate Eyes: Conjunctivae and EOM are normal. Pupils are equal, round, and reactive to light.  Neck: Normal range of motion. Neck supple.  Cardiovascular: Normal rate and regular rhythm.   Pulmonary/Chest: Effort normal and breath sounds decreased with bronchial sounds and mild bilat insp wheezes.  Neurological: Pt is alert. ? Mild confused Skin: Skin is warm. No erythema.  Psychiatric: Pt behavior is normal. Thought content normal but has to be told several times about dx and tx      Assessment & Plan:

## 2013-12-22 NOTE — Telephone Encounter (Signed)
Sorry for the confusion; no need to take the prednisone, this was an error  Pt also has medrol to take instead; this was explained to pt at end of visit, but he appears somewhat confused today

## 2013-12-22 NOTE — Progress Notes (Signed)
Pre-visit discussion using our clinic review tool. No additional management support is needed unless otherwise documented below in the visit note.  

## 2013-12-22 NOTE — Assessment & Plan Note (Signed)
Mild to mod, for antibx course,  to f/u any worsening symptoms or concerns 

## 2013-12-22 NOTE — Telephone Encounter (Signed)
Pharmacist from [facility] called to inform the patient does not feel comfortable taking prednisone and would like something else sent in.

## 2013-12-22 NOTE — Assessment & Plan Note (Signed)
stable overall by history and exam, recent data reviewed with pt, and pt to continue medical treatment as before,  to f/u any worsening symptoms or concerns Lab Results  Component Value Date   HGBA1C 6.1 02/19/2013   To call for cbg > 200 or onset polys with illness and steroid tx

## 2014-01-12 ENCOUNTER — Other Ambulatory Visit: Payer: Self-pay | Admitting: Family Medicine

## 2014-01-18 ENCOUNTER — Ambulatory Visit (INDEPENDENT_AMBULATORY_CARE_PROVIDER_SITE_OTHER): Payer: Medicare Other | Admitting: Family Medicine

## 2014-01-18 ENCOUNTER — Encounter: Payer: Self-pay | Admitting: Family Medicine

## 2014-01-18 VITALS — BP 140/64 | HR 71 | Temp 97.7°F | Wt 192.0 lb

## 2014-01-18 DIAGNOSIS — B079 Viral wart, unspecified: Secondary | ICD-10-CM | POA: Insufficient documentation

## 2014-01-18 DIAGNOSIS — M25539 Pain in unspecified wrist: Secondary | ICD-10-CM | POA: Diagnosis not present

## 2014-01-18 DIAGNOSIS — M25532 Pain in left wrist: Secondary | ICD-10-CM

## 2014-01-18 HISTORY — DX: Viral wart, unspecified: B07.9

## 2014-01-18 NOTE — Progress Notes (Signed)
Pre visit review using our clinic review tool, if applicable. No additional management support is needed unless otherwise documented below in the visit note. 

## 2014-01-18 NOTE — Assessment & Plan Note (Signed)
Bottom L foot Refer to podiatry

## 2014-01-18 NOTE — Patient Instructions (Signed)
Wrist Pain Wrist injuries are frequent in adults and children. A sprain is an injury to the ligaments that hold your bones together. A strain is an injury to muscle or muscle cord-like structures (tendons) from stretching or pulling. Generally, when wrists are moderately tender to touch following a fall or injury, a break in the bone (fracture) may be present. Most wrist sprains or strains are better in 3 to 5 days, but complete healing may take several weeks. HOME CARE INSTRUCTIONS   Put ice on the injured area.  Put ice in a plastic bag.  Place a towel between your skin and the bag.  Leave the ice on for 15-20 minutes, 03-04 times a day, for the first 2 days.  Keep your arm raised above the level of your heart whenever possible to reduce swelling and pain.  Rest the injured area for at least 48 hours or as directed by your caregiver.  If a splint or elastic bandage has been applied, use it for as long as directed by your caregiver or until seen by a caregiver for a follow-up exam.  Only take over-the-counter or prescription medicines for pain, discomfort, or fever as directed by your caregiver.  Keep all follow-up appointments. You may need to follow up with a specialist or have follow-up X-rays. Improvement in pain level is not a guarantee that you did not fracture a bone in your wrist. The only way to determine whether or not you have a broken bone is by X-ray. SEEK IMMEDIATE MEDICAL CARE IF:   Your fingers are swollen, very red, white, or cold and blue.  Your fingers are numb or tingling.  You have increasing pain.  You have difficulty moving your fingers. MAKE SURE YOU:   Understand these instructions.  Will watch your condition.  Will get help right away if you are not doing well or get worse. Document Released: 09/19/2005 Document Revised: 03/03/2012 Document Reviewed: 01/31/2011 ExitCare Patient Information 2014 [employer].  

## 2014-01-18 NOTE — Progress Notes (Signed)
  Subjective:    Douglas Grant is an 80 y.o. male who presents for evaluation of left wrist pain. Onset was gradual, starting about several months ago. The pain is mild, worsens with movement, and is relieved by rest. There is no associated numbness, tingling, weakness in wrist. There is history of overuse -- pt frequently plays banjo. Evaluation to date: none. Treatment to date: wrist splint which is somewhat effective.  Pt also c/o hard spot on bottom L heel. Hurts on and off.  The following portions of the patient's history were reviewed and updated as appropriate: allergies, current medications, past family history, past medical history, past social history, past surgical history and problem list.  Review of Systems Pertinent items are noted in HPI.   Objective:    BP 140/64  Pulse 71  Temp(Src) 97.7 F (36.5 C) (Oral)  Wt 192 lb (87.091 kg)  SpO2 96% Right wrist:  normal exam, no swelling, tenderness, instability; ligaments intact, full ROM both hands, wrists, and finger joints  Left wrist:  positive Phalen, positive Tinel and remainder of ipsilateral wrist, hand and finger exam is normal  L foot-- + hard wart bottom L heel, nontender Imaging: X-ray left wrist: not indicated   Assessment:    suspect CTS- on the left side   Plan:    Natural history and expected course discussed. Questions answered. Scientist, clinical (histocompatibility and immunogenetics) distributed. Resr, ice, compression, and elevation (RICE) therapy. Volar wrist splint to be worn mosly at night and prn.  If no relief we will refer to hand surgeon

## 2014-01-25 ENCOUNTER — Other Ambulatory Visit: Payer: Self-pay | Admitting: Family Medicine

## 2014-01-25 ENCOUNTER — Ambulatory Visit: Payer: Self-pay | Admitting: Podiatry

## 2014-02-08 ENCOUNTER — Ambulatory Visit: Payer: Self-pay | Admitting: Podiatry

## 2014-02-19 ENCOUNTER — Ambulatory Visit: Payer: Self-pay | Admitting: Podiatry

## 2014-02-19 ENCOUNTER — Telehealth: Payer: Self-pay | Admitting: Family Medicine

## 2014-02-19 MED ORDER — METFORMIN HCL 1000 MG PO TABS: 1000.0000 mg | ORAL_TABLET | Freq: Two times a day (BID) | ORAL | Status: DC

## 2014-02-19 NOTE — Addendum Note (Signed)
Addended by: Ewing Schlein on: 02/19/2014 02:29 PM   Modules accepted: Orders

## 2014-02-19 NOTE — Telephone Encounter (Signed)
Rx sent to [facility]      KP 

## 2014-02-19 NOTE — Telephone Encounter (Signed)
Pt came in today requesting a refill on his Metformin 1000 mg.  He only wants a 30 day supply to be filled.  Please advise.

## 2014-02-19 NOTE — Telephone Encounter (Deleted)
Pt came by requesting a refill on hie Metformin 1000

## 2014-02-26 ENCOUNTER — Other Ambulatory Visit: Payer: Self-pay

## 2014-02-26 MED ORDER — ALFUZOSIN HCL ER 10 MG PO TB24: ORAL_TABLET | ORAL | Status: DC

## 2014-02-26 MED ORDER — TELMISARTAN 80 MG PO TABS: ORAL_TABLET | ORAL | Status: DC

## 2014-02-26 MED ORDER — LEVOTHYROXINE SODIUM 75 MCG PO TABS: ORAL_TABLET | ORAL | Status: DC

## 2014-02-26 NOTE — Telephone Encounter (Signed)
Patient came into the office to get refills on his medications to take with him to [facility]. Medications printed and given to the patient.     KP

## 2014-03-01 ENCOUNTER — Other Ambulatory Visit: Payer: Medicare Other

## 2014-03-02 ENCOUNTER — Other Ambulatory Visit (INDEPENDENT_AMBULATORY_CARE_PROVIDER_SITE_OTHER): Payer: Medicare Other

## 2014-03-02 DIAGNOSIS — N400 Enlarged prostate without lower urinary tract symptoms: Secondary | ICD-10-CM

## 2014-03-02 DIAGNOSIS — E1159 Type 2 diabetes mellitus with other circulatory complications: Secondary | ICD-10-CM

## 2014-03-02 DIAGNOSIS — E785 Hyperlipidemia, unspecified: Secondary | ICD-10-CM

## 2014-03-02 LAB — POCT URINALYSIS DIPSTICK
BILIRUBIN UA: NEGATIVE
GLUCOSE UA: NEGATIVE
Ketones, UA: NEGATIVE
Leukocytes, UA: NEGATIVE
Nitrite, UA: NEGATIVE
Protein, UA: NEGATIVE
RBC UA: NEGATIVE
SPEC GRAV UA: 1.025
Urobilinogen, UA: 0.2
pH, UA: 6

## 2014-03-02 LAB — HEPATIC FUNCTION PANEL
ALT: 8 U/L (ref 0–53)
AST: 15 U/L (ref 0–37)
Albumin: 4 g/dL (ref 3.5–5.2)
Alkaline Phosphatase: 42 U/L (ref 39–117)
BILIRUBIN TOTAL: 1 mg/dL (ref 0.3–1.2)
Bilirubin, Direct: 0.2 mg/dL (ref 0.0–0.3)
Total Protein: 6.7 g/dL (ref 6.0–8.3)

## 2014-03-02 LAB — CBC WITH DIFFERENTIAL/PLATELET
Basophils Absolute: 0 10*3/uL (ref 0.0–0.1)
Basophils Relative: 0.5 % (ref 0.0–3.0)
Eosinophils Absolute: 0.4 10*3/uL (ref 0.0–0.7)
Eosinophils Relative: 6.3 % — ABNORMAL HIGH (ref 0.0–5.0)
HCT: 37.1 % — ABNORMAL LOW (ref 39.0–52.0)
Hemoglobin: 12.3 g/dL — ABNORMAL LOW (ref 13.0–17.0)
Lymphocytes Relative: 32.1 % (ref 12.0–46.0)
Lymphs Abs: 2 10*3/uL (ref 0.7–4.0)
MCHC: 33 g/dL (ref 30.0–36.0)
MCV: 92.2 fl (ref 78.0–100.0)
Monocytes Absolute: 0.4 10*3/uL (ref 0.1–1.0)
Monocytes Relative: 6.6 % (ref 3.0–12.0)
Neutro Abs: 3.4 10*3/uL (ref 1.4–7.7)
Neutrophils Relative %: 54.5 % (ref 43.0–77.0)
Platelets: 298 10*3/uL (ref 150.0–400.0)
RBC: 4.02 Mil/uL — ABNORMAL LOW (ref 4.22–5.81)
RDW: 15.2 % — ABNORMAL HIGH (ref 11.5–14.6)
WBC: 6.2 10*3/uL (ref 4.5–10.5)

## 2014-03-02 LAB — MICROALBUMIN / CREATININE URINE RATIO
Creatinine,U: 240 mg/dL
MICROALB/CREAT RATIO: 0.5 mg/g (ref 0.0–30.0)
Microalb, Ur: 1.1 mg/dL (ref 0.0–1.9)

## 2014-03-02 LAB — LIPID PANEL
Cholesterol: 207 mg/dL — ABNORMAL HIGH (ref 0–200)
HDL: 50.4 mg/dL (ref >39.00)
LDL CALC: 141 mg/dL — AB (ref 0–99)
TRIGLYCERIDES: 78 mg/dL (ref 0.0–149.0)
Total CHOL/HDL Ratio: 4
VLDL: 15.6 mg/dL (ref 0.0–40.0)

## 2014-03-02 LAB — BASIC METABOLIC PANEL
BUN: 9 mg/dL (ref 6–23)
CALCIUM: 9.2 mg/dL (ref 8.4–10.5)
CO2: 28 mEq/L (ref 19–32)
Chloride: 103 mEq/L (ref 96–112)
Creatinine, Ser: 0.9 mg/dL (ref 0.4–1.5)
GFR: 87.09 mL/min (ref >60.00)
Glucose, Bld: 121 mg/dL — ABNORMAL HIGH (ref 70–99)
Potassium: 4.4 mEq/L (ref 3.5–5.1)
Sodium: 139 mEq/L (ref 135–145)

## 2014-03-02 LAB — PSA: PSA: 0.25 ng/mL (ref 0.10–4.00)

## 2014-03-02 LAB — HEMOGLOBIN A1C: HEMOGLOBIN A1C: 6.1 % (ref 4.6–6.5)

## 2014-03-08 MED ORDER — PRAVASTATIN SODIUM 40 MG PO TABS: 20.0000 mg | ORAL_TABLET | Freq: Every day | ORAL | Status: DC

## 2014-03-29 ENCOUNTER — Telehealth: Payer: Self-pay | Admitting: *Deleted

## 2014-03-29 ENCOUNTER — Other Ambulatory Visit: Payer: Self-pay | Admitting: *Deleted

## 2014-03-29 MED ORDER — METFORMIN HCL 1000 MG PO TABS: 1000.0000 mg | ORAL_TABLET | Freq: Two times a day (BID) | ORAL | Status: DC

## 2014-03-29 NOTE — Telephone Encounter (Signed)
rx refilled.

## 2014-05-18 ENCOUNTER — Other Ambulatory Visit: Payer: Self-pay

## 2014-05-18 ENCOUNTER — Telehealth: Payer: Self-pay | Admitting: *Deleted

## 2014-05-18 ENCOUNTER — Other Ambulatory Visit: Payer: Self-pay | Admitting: Family Medicine

## 2014-05-18 DIAGNOSIS — K219 Gastro-esophageal reflux disease without esophagitis: Secondary | ICD-10-CM

## 2014-05-18 MED ORDER — ESOMEPRAZOLE MAGNESIUM 40 MG PO CPDR: 40.0000 mg | DELAYED_RELEASE_CAPSULE | Freq: Every day | ORAL | Status: DC

## 2014-05-18 NOTE — Telephone Encounter (Signed)
Faxed Rx and left a Msg making the patient aware Rx faxed    KP

## 2014-05-18 NOTE — Telephone Encounter (Signed)
Caller name:  [name] Relation to pt:  daughter Call back number:  [phone] during the day, until 5 Pharmacy:  [facility] [address]  Reason for call: Pt daughter called requesting refill on esomeprazole (NEXIUM) 40 MG capsule, Last filled 08/28/13, #90, 1 refill.  Last OV 01/18/2014  Please advise when sent to pharmacy.  bw

## 2014-06-14 ENCOUNTER — Telehealth: Payer: Self-pay | Admitting: *Deleted

## 2014-06-14 MED ORDER — TELMISARTAN 80 MG PO TABS: ORAL_TABLET | ORAL | Status: DC

## 2014-06-14 MED ORDER — FINASTERIDE 5 MG PO TABS: ORAL_TABLET | ORAL | Status: DC

## 2014-06-14 NOTE — Telephone Encounter (Signed)
Caller name:  Randye Lobo Relation to pt: daughter Call back number: [phone]  Pharmacy:  [facility]  Reason for call: Pt daughter called to request refills on:  finasteride (PROSCAR) 5 MG tablet  Last filled 08/28/2013, #90, 1 refill  telmisartan (MICARDIS) 80 MG tablet  Last filled 02/26/2014, #90, 1 refill  Last OV 01/18/2014 Please write for 1 month supply at a time, for 3 month time period.  bw

## 2014-06-14 NOTE — Telephone Encounter (Signed)
Spoke with daughter and she wants a 30 day supply and a 90 day supply to [facility]. The patient will call when they are ready for the 90 day supply.     KP

## 2014-07-06 ENCOUNTER — Telehealth: Payer: Self-pay | Admitting: *Deleted

## 2014-07-06 MED ORDER — TELMISARTAN 80 MG PO TABS: ORAL_TABLET | ORAL | Status: DC

## 2014-07-06 MED ORDER — ESOMEPRAZOLE MAGNESIUM 40 MG PO CPDR: 40.0000 mg | DELAYED_RELEASE_CAPSULE | Freq: Every day | ORAL | Status: DC

## 2014-07-06 MED ORDER — METFORMIN HCL 1000 MG PO TABS: 1000.0000 mg | ORAL_TABLET | Freq: Two times a day (BID) | ORAL | Status: DC

## 2014-07-06 MED ORDER — PRAVASTATIN SODIUM 40 MG PO TABS: 40.0000 mg | ORAL_TABLET | Freq: Every day | ORAL | Status: DC

## 2014-07-06 MED ORDER — LEVOTHYROXINE SODIUM 75 MCG PO TABS: ORAL_TABLET | ORAL | Status: DC

## 2014-07-06 MED ORDER — FINASTERIDE 5 MG PO TABS: ORAL_TABLET | ORAL | Status: DC

## 2014-07-06 NOTE — Telephone Encounter (Signed)
Caller name:  Randye Lobo Relation to pt: daughter Call back number: [phone]  Pharmacy:  Reason for call: Pt will be going to [facility] this Thursday and request 90 day supply on the following prescriptions  esomeprazole (NEXIUM) 40 MG capsule  Last filled 05/18/2014, #90, 1 refill  metFORMIN (GLUCOPHAGE) 1000 MG tablet  Last filled 03/29/2014, #60, 6 refills  pravastatin (PRAVACHOL) 40 MG tablet  Last filled 03/08/2014, #90, 1 refill  levothyroxine (SYNTHROID, LEVOTHROID) 75 MCG tablet Last filled 02/26/2014, #90, 1 refill  telmisartan (MICARDIS) 80 MG tablet  Last filled 06/14/2014, #30, no refills  finasteride (PROSCAR) 5 MG tablet  Last filled 06/14/2014, #30, no refills  Please advise.

## 2014-07-06 NOTE — Telephone Encounter (Signed)
Med's printed and left at check in for pick up.     KP

## 2014-07-08 ENCOUNTER — Telehealth: Payer: Self-pay

## 2014-07-08 NOTE — Telephone Encounter (Signed)
Spoke with the pharmacist at [facility] and he stated that they no longer carry the Micardis 80 mg. He said they can substitute it for either Diovan or Cozaar. Dr.Lowne is out of the office and per Dr.Paz it is ok to switch to Cozaar 100 mg 1 po qd. The patient is to follow up in 4 weeks for BP check and BMP. The pharmacist made the patient aware and the patient will follow up when he gets back in town.      KP

## 2014-08-02 ENCOUNTER — Encounter: Payer: Self-pay | Admitting: Gastroenterology

## 2014-09-02 ENCOUNTER — Telehealth: Payer: Self-pay | Admitting: *Deleted

## 2014-09-02 MED ORDER — ALFUZOSIN HCL ER 10 MG PO TB24: ORAL_TABLET | ORAL | Status: DC

## 2014-09-02 NOTE — Telephone Encounter (Signed)
Pt requesting refill on  alfuzosin (UROXATRAL) 10 MG 24 hr tablet  Last filled 02/26/2014, #90, 3 refills sent to [place] 01/18/2014  Pt is requesting 1 month be sent to [place], he will not be going to [facility] before he runs out.

## 2014-10-11 ENCOUNTER — Telehealth: Payer: Self-pay | Admitting: Family Medicine

## 2014-10-11 ENCOUNTER — Telehealth: Payer: Self-pay | Admitting: *Deleted

## 2014-10-11 MED ORDER — METFORMIN HCL 1000 MG PO TABS: 1000.0000 mg | ORAL_TABLET | Freq: Two times a day (BID) | ORAL | Status: DC

## 2014-10-11 MED ORDER — ALFUZOSIN HCL ER 10 MG PO TB24: ORAL_TABLET | ORAL | Status: DC

## 2014-10-11 MED ORDER — ESOMEPRAZOLE MAGNESIUM 40 MG PO CPDR: 40.0000 mg | DELAYED_RELEASE_CAPSULE | Freq: Every day | ORAL | Status: DC

## 2014-10-11 MED ORDER — TELMISARTAN 80 MG PO TABS: ORAL_TABLET | ORAL | Status: DC

## 2014-10-11 NOTE — Telephone Encounter (Signed)
Caller name: [name]  Relation to pt: daughter  Call back number: [phone] Pharmacy: [facility] [phone]   Reason for call:  Requesting a refill of  metFORMIN (GLUCOPHAGE) 1000 MG tablet esomeprazole (NEXIUM) 40 MG capsule alfuzosin (UROXATRAL) 10 MG 24 hr tablet  telmisartan (MICARDIS) 80 MG tablet

## 2014-10-11 NOTE — Telephone Encounter (Signed)
Prior authorization form for Nexium completed and faxed to [facility]. Awaiting determination. [id number]

## 2014-10-11 NOTE — Telephone Encounter (Signed)
All prescriptions sent to the pharmacy with note for annual exam needed. Notified daughter on [place]

## 2014-10-12 ENCOUNTER — Ambulatory Visit (INDEPENDENT_AMBULATORY_CARE_PROVIDER_SITE_OTHER): Payer: Medicare Other

## 2014-10-12 DIAGNOSIS — Z23 Encounter for immunization: Secondary | ICD-10-CM

## 2014-10-28 ENCOUNTER — Telehealth: Payer: Self-pay | Admitting: Family Medicine

## 2014-10-28 MED ORDER — ESOMEPRAZOLE MAGNESIUM 40 MG PO CPDR: 40.0000 mg | DELAYED_RELEASE_CAPSULE | Freq: Every day | ORAL | Status: DC

## 2014-10-28 MED ORDER — ALFUZOSIN HCL ER 10 MG PO TB24: ORAL_TABLET | ORAL | Status: DC

## 2014-10-28 MED ORDER — METFORMIN HCL 1000 MG PO TABS: 1000.0000 mg | ORAL_TABLET | Freq: Two times a day (BID) | ORAL | Status: DC

## 2014-10-28 MED ORDER — TELMISARTAN 80 MG PO TABS: ORAL_TABLET | ORAL | Status: DC

## 2014-10-28 NOTE — Telephone Encounter (Signed)
Informed patient of med refill and he scheduled appointment for 11/24

## 2014-10-28 NOTE — Telephone Encounter (Signed)
Caller name: Lonn [place] --[facility] Relation to pt: Call back number: [phone] Pharmacy:  Reason for call:   Kayla from [place] states that patient is requesting a new rx of alfuzosin hcl er tab 10mg , micardis tab, metformin hcl tab, nexium capsule 40mg . I called and spoke to patient and he does want rx to go to [facility]. Ref# [id number]

## 2014-10-28 NOTE — Telephone Encounter (Signed)
Med's sent but the patient needs an apt.       KP

## 2014-11-08 ENCOUNTER — Telehealth: Payer: Self-pay | Admitting: Family Medicine

## 2014-11-08 NOTE — Telephone Encounter (Signed)
Error

## 2014-11-16 ENCOUNTER — Encounter: Payer: Self-pay | Admitting: Family Medicine

## 2014-11-16 ENCOUNTER — Ambulatory Visit (INDEPENDENT_AMBULATORY_CARE_PROVIDER_SITE_OTHER): Payer: Medicare Other | Admitting: Family Medicine

## 2014-11-16 VITALS — BP 130/58 | HR 83 | Temp 98.2°F | Wt 190.0 lb

## 2014-11-16 DIAGNOSIS — E1165 Type 2 diabetes mellitus with hyperglycemia: Secondary | ICD-10-CM | POA: Diagnosis not present

## 2014-11-16 DIAGNOSIS — K219 Gastro-esophageal reflux disease without esophagitis: Secondary | ICD-10-CM | POA: Diagnosis not present

## 2014-11-16 DIAGNOSIS — E785 Hyperlipidemia, unspecified: Secondary | ICD-10-CM | POA: Diagnosis not present

## 2014-11-16 DIAGNOSIS — N400 Enlarged prostate without lower urinary tract symptoms: Secondary | ICD-10-CM

## 2014-11-16 DIAGNOSIS — E1139 Type 2 diabetes mellitus with other diabetic ophthalmic complication: Secondary | ICD-10-CM | POA: Diagnosis not present

## 2014-11-16 DIAGNOSIS — Z23 Encounter for immunization: Secondary | ICD-10-CM

## 2014-11-16 DIAGNOSIS — I10 Essential (primary) hypertension: Secondary | ICD-10-CM | POA: Diagnosis not present

## 2014-11-16 DIAGNOSIS — E039 Hypothyroidism, unspecified: Secondary | ICD-10-CM

## 2014-11-16 DIAGNOSIS — IMO0002 Reserved for concepts with insufficient information to code with codable children: Secondary | ICD-10-CM

## 2014-11-16 MED ORDER — TELMISARTAN 80 MG PO TABS: ORAL_TABLET | ORAL | Status: DC

## 2014-11-16 MED ORDER — LEVOTHYROXINE SODIUM 75 MCG PO TABS: ORAL_TABLET | ORAL | Status: DC

## 2014-11-16 MED ORDER — FINASTERIDE 5 MG PO TABS: ORAL_TABLET | ORAL | Status: DC

## 2014-11-16 MED ORDER — ALFUZOSIN HCL ER 10 MG PO TB24: ORAL_TABLET | ORAL | Status: DC

## 2014-11-16 MED ORDER — ESOMEPRAZOLE MAGNESIUM 40 MG PO CPDR: 40.0000 mg | DELAYED_RELEASE_CAPSULE | Freq: Every day | ORAL | Status: DC

## 2014-11-16 MED ORDER — PRAVASTATIN SODIUM 40 MG PO TABS: 40.0000 mg | ORAL_TABLET | Freq: Every day | ORAL | Status: DC

## 2014-11-16 MED ORDER — METFORMIN HCL 1000 MG PO TABS: 1000.0000 mg | ORAL_TABLET | Freq: Two times a day (BID) | ORAL | Status: DC

## 2014-11-16 NOTE — Progress Notes (Signed)
Pre visit review using our clinic review tool, if applicable. No additional management support is needed unless otherwise documented below in the visit note. 

## 2014-11-16 NOTE — Progress Notes (Signed)
   Subjective:    Patient ID: Douglas Grant, male    DOB: [birth year], 80 y.o.   MRN: [id number]  HPI  HPI HYPERTENSION  Blood pressure range-not checking   Chest pain- no      Dyspnea- no Lightheadedness- no   Edema- no Other side effects - no   Medication compliance: good Low salt diet- yes  DIABETES  Blood Sugar ranges-not checking   Polyuria- no New Visual problems- no Hypoglycemic symptoms- no Other side effects-no Medication compliance - good Last eye exam- due Foot exam- today  HYPERLIPIDEMIA  Medication compliance- good RUQ pain- no  Muscle aches- no Other side effects-no    Review of Systems As above     Objective:   Physical Exam BP 130/58 mmHg  Pulse 83  Temp(Src) 98.2 F (36.8 C) (Oral)  Wt 190 lb (86.183 kg)  SpO2 98% General appearance: alert, cooperative, appears stated age and no distress Throat: lips, mucosa, and tongue normal; teeth and gums normal Neck: no adenopathy, supple, symmetrical, trachea midline and thyroid not enlarged, symmetric, no tenderness/mass/nodules Lungs: clear to auscultation bilaterally Heart: S1, S2 normal Extremities: extremities normal, atraumatic, no cyanosis or edema  Sensory exam of the foot is normal, tested with the monofilament. Good pulses, no lesions or ulcers, good peripheral pulses. Nails thickened       Assessment & Plan:  1. DM (diabetes mellitus), type 2, uncontrolled w/ophthalmic complication Check labs - Hemoglobin A1c; Future - Microalbumin / creatinine urine ratio; Future - POCT urinalysis dipstick; Future - metFORMIN (GLUCOPHAGE) 1000 MG tablet; Take 1 tablet (1,000 mg total) by mouth 2 (two) times daily with a meal.  Dispense: 180 tablet; Refill: 3  2. Hyperlipidemia Check labs, con't meds - Hepatic function panel; Future - Lipid panel; Future - pravastatin (PRAVACHOL) 40 MG tablet; Take 1 tablet (40 mg total) by mouth daily.  Dispense: 90 tablet; Refill: 3  3. Essential  hypertension Stable, con't meds - Basic metabolic panel; Future - POCT urinalysis dipstick; Future - telmisartan (MICARDIS) 80 MG tablet; TAKE ONE TABLET BY MOUTH ONCE DAILY  Dispense: 90 tablet; Refill: 3  4. Hypothyroidism, unspecified hypothyroidism type Check labs - levothyroxine (SYNTHROID, LEVOTHROID) 75 MCG tablet; 1 tab by mouth daily  Dispense: 90 tablet; Refill: 3 - TSH; Future  5. BPH (benign prostatic hypertrophy)   - finasteride (PROSCAR) 5 MG tablet; TAKE ONE TABLET BY MOUTH EVERY DAY  Dispense: 90 tablet; Refill: 3 - alfuzosin (UROXATRAL) 10 MG 24 hr tablet; TAKE 1 TABLET EVERY DAY  Dispense: 90 tablet; Refill: 0  6. Gastroesophageal reflux disease without esophagitis   - esomeprazole (NEXIUM) 40 MG capsule; Take 1 capsule (40 mg total) by mouth daily.  Dispense: 90 capsule; Refill: 0

## 2014-11-16 NOTE — Patient Instructions (Signed)
Diabetes and Standards of Medical Care Diabetes is complicated. You may find that your diabetes team includes a dietitian, nurse, diabetes educator, eye doctor, and more. To help everyone know what is going on and to help you get the care you deserve, the following schedule of care was developed to help keep you on track. Below are the tests, exams, vaccines, medicines, education, and plans you will need. HbA1c test This test shows how well you have controlled your glucose over the past 2-3 months. It is used to see if your diabetes management plan needs to be adjusted.   It is performed at least 2 times a year if you are meeting treatment goals.  It is performed 4 times a year if therapy has changed or if you are not meeting treatment goals. Blood pressure test  This test is performed at every routine medical visit. The goal is less than 140/90 mm Hg for most people, but 130/80 mm Hg in some cases. Ask your health care provider about your goal. Dental exam  Follow up with the dentist regularly. Eye exam  If you are diagnosed with type 1 diabetes as a child, get an exam upon reaching the age of 37 years or older and have had diabetes for 3-5 years. Yearly eye exams are recommended after that initial eye exam.  If you are diagnosed with type 1 diabetes as an adult, get an exam within 5 years of diagnosis and then yearly.  If you are diagnosed with type 2 diabetes, get an exam as soon as possible after the diagnosis and then yearly. Foot care exam  Visual foot exams are performed at every routine medical visit. The exams check for cuts, injuries, or other problems with the feet.  A comprehensive foot exam should be done yearly. This includes visual inspection as well as assessing foot pulses and testing for loss of sensation.  Check your feet nightly for cuts, injuries, or other problems with your feet. Tell your health care provider if anything is not healing. Kidney function test (urine  microalbumin)  This test is performed once a year.  Type 1 diabetes: The first test is performed 5 years after diagnosis.  Type 2 diabetes: The first test is performed at the time of diagnosis.  A serum creatinine and estimated glomerular filtration rate (eGFR) test is done once a year to assess the level of chronic kidney disease (CKD), if present. Lipid profile (cholesterol, HDL, LDL, triglycerides)  Performed every 5 years for most people.  The goal for LDL is less than 100 mg/dL. If you are at high risk, the goal is less than 70 mg/dL.  The goal for HDL is 40 mg/dL-50 mg/dL for men and 50 mg/dL-60 mg/dL for women. An HDL cholesterol of 60 mg/dL or higher gives some protection against heart disease.  The goal for triglycerides is less than 150 mg/dL. Influenza vaccine, pneumococcal vaccine, and hepatitis B vaccine  The influenza vaccine is recommended yearly.  It is recommended that people with diabetes who are over 24 years old get the pneumonia vaccine. In some cases, two separate shots may be given. Ask your health care provider if your pneumonia vaccination is up to date.  The hepatitis B vaccine is also recommended for adults with diabetes. Diabetes self-management education  Education is recommended at diagnosis and ongoing as needed. Treatment plan  Your treatment plan is reviewed at every medical visit. Document Released: 10/07/2009 Document Revised: 04/26/2014 Document Reviewed: 05/12/2013 [facility] Patient Information 2015 [place],  [employer]. This information is not intended to replace advice given to you by your health care provider. Make sure you discuss any questions you have with your health care provider.  

## 2015-01-06 ENCOUNTER — Other Ambulatory Visit: Payer: Self-pay | Admitting: *Deleted

## 2015-01-06 DIAGNOSIS — K219 Gastro-esophageal reflux disease without esophagitis: Secondary | ICD-10-CM

## 2015-01-06 MED ORDER — ESOMEPRAZOLE MAGNESIUM 40 MG PO CPDR: 40.0000 mg | DELAYED_RELEASE_CAPSULE | Freq: Every day | ORAL | Status: DC

## 2015-01-07 ENCOUNTER — Other Ambulatory Visit: Payer: Self-pay | Admitting: *Deleted

## 2015-01-07 DIAGNOSIS — N400 Enlarged prostate without lower urinary tract symptoms: Secondary | ICD-10-CM

## 2015-01-07 MED ORDER — ALFUZOSIN HCL ER 10 MG PO TB24: ORAL_TABLET | ORAL | Status: DC

## 2015-04-01 ENCOUNTER — Encounter: Payer: Self-pay | Admitting: Family Medicine

## 2015-04-01 ENCOUNTER — Ambulatory Visit (INDEPENDENT_AMBULATORY_CARE_PROVIDER_SITE_OTHER): Payer: Medicare Other | Admitting: Family Medicine

## 2015-04-01 VITALS — BP 128/70 | HR 79 | Temp 98.9°F | Resp 18 | Ht 68.0 in | Wt 182.0 lb

## 2015-04-01 DIAGNOSIS — E119 Type 2 diabetes mellitus without complications: Secondary | ICD-10-CM | POA: Diagnosis not present

## 2015-04-01 DIAGNOSIS — I10 Essential (primary) hypertension: Secondary | ICD-10-CM

## 2015-04-01 DIAGNOSIS — L853 Xerosis cutis: Secondary | ICD-10-CM | POA: Diagnosis not present

## 2015-04-01 DIAGNOSIS — E039 Hypothyroidism, unspecified: Secondary | ICD-10-CM | POA: Diagnosis not present

## 2015-04-01 DIAGNOSIS — E785 Hyperlipidemia, unspecified: Secondary | ICD-10-CM

## 2015-04-01 MED ORDER — AMMONIUM LACTATE 12 % EX CREA: TOPICAL_CREAM | CUTANEOUS | Status: DC | PRN

## 2015-04-01 NOTE — Assessment & Plan Note (Signed)
Check tsh con't synthroid 

## 2015-04-01 NOTE — Assessment & Plan Note (Addendum)
con't pravachol

## 2015-04-01 NOTE — Progress Notes (Signed)
Subjective:    Patient ID: Douglas Grant, male    DOB: [birth year], 81 y.o.   MRN: [id number]  HPI  Patient here c/o itchy skin---he has been putting vita e on it.     Past Medical History  Diagnosis Date  . Diabetes mellitus   . Hyperlipidemia   . GERD (gastroesophageal reflux disease)   . Thyroid disease   . Hypertension   . Heart murmur   . Stroke 03/2011    R lat thalamus, post in int capsule  . Personal history of colonic polyps     Review of Systems  Constitutional: Negative for fatigue and unexpected weight change.  Respiratory: Negative for cough and shortness of breath.   Cardiovascular: Negative for chest pain and palpitations.  Skin:       Pruritic skin--no rash    Current Outpatient Prescriptions on File Prior to Visit  Medication Sig Dispense Refill  . alfuzosin (UROXATRAL) 10 MG 24 hr tablet TAKE 1 TABLET EVERY DAY 90 tablet 0  . esomeprazole (NEXIUM) 40 MG capsule Take 1 capsule (40 mg total) by mouth daily. 90 capsule 1  . finasteride (PROSCAR) 5 MG tablet TAKE ONE TABLET BY MOUTH EVERY DAY 90 tablet 3  . levothyroxine (SYNTHROID, LEVOTHROID) 75 MCG tablet 1 tab by mouth daily 90 tablet 3  . metFORMIN (GLUCOPHAGE) 1000 MG tablet Take 1 tablet (1,000 mg total) by mouth 2 (two) times daily with a meal. 180 tablet 3  . pravastatin (PRAVACHOL) 40 MG tablet Take 1 tablet (40 mg total) by mouth daily. 90 tablet 3  . telmisartan (MICARDIS) 80 MG tablet TAKE ONE TABLET BY MOUTH ONCE DAILY 90 tablet 3   No current facility-administered medications on file prior to visit.       Objective:    Physical Exam  Constitutional: He is oriented to person, place, and time. He appears well-developed and well-nourished. No distress.  Cardiovascular: Normal rate, regular rhythm and normal heart sounds.   Pulmonary/Chest: Effort normal and breath sounds normal. No respiratory distress.  Neurological: He is alert and oriented to person, place, and time.  Skin: Skin is dry.  No rash noted. No erythema.  Psychiatric: He has a normal mood and affect. His behavior is normal. Judgment and thought content normal.    BP 128/70 mmHg  Pulse 79  Temp(Src) 98.9 F (37.2 C) (Oral)  Resp 18  Ht 5\' 8"  (1.727 m)  Wt 182 lb (82.555 kg)  BMI 27.68 kg/m2  SpO2 96% Wt Readings from Last 3 Encounters:  04/01/15 182 lb (82.555 kg)  11/16/14 190 lb (86.183 kg)  01/18/14 192 lb (87.091 kg)     Lab Results  Component Value Date   WBC 6.2 03/02/2014   HGB 12.3* 03/02/2014   HCT 37.1* 03/02/2014   PLT 298.0 03/02/2014   GLUCOSE 121* 03/02/2014   CHOL 207* 03/02/2014   TRIG 78.0 03/02/2014   HDL 50.40 03/02/2014   LDLDIRECT 155.3 10/06/2010   LDLCALC 141* 03/02/2014   ALT 8 03/02/2014   AST 15 03/02/2014   NA 139 03/02/2014   K 4.4 03/02/2014   CL 103 03/02/2014   CREATININE 0.9 03/02/2014   BUN 9 03/02/2014   CO2 28 03/02/2014   TSH 1.74 08/29/2012   PSA 0.25 03/02/2014   INR 1.18 04/21/2011   HGBA1C 6.1 03/02/2014   MICROALBUR 1.1 03/02/2014       Assessment & Plan:   Problem List Items Addressed This Visit    Essential  hypertension    micardis Check labs      Hypothyroidism    Check tsh con't synthroid       Other Visit Diagnoses    Type 2 diabetes mellitus without complication    -  Primary    Relevant Orders    Ambulatory referral to Podiatry    Dry skin        Relevant Medications    ammonium lactate (AMLACTIN) 12 % cream    Hyperlipidemia           I am having Douglas Grant start on ammonium lactate. I am also having him maintain his telmisartan, metFORMIN, finasteride, pravastatin, levothyroxine, esomeprazole, and alfuzosin.  Meds ordered this encounter  Medications  . ammonium lactate (AMLACTIN) 12 % cream    Sig: Apply topically as needed for dry skin.    Dispense:  385 g    Refill:  0     Garnet Koyanagi, DO

## 2015-04-01 NOTE — Progress Notes (Signed)
Pre visit review using our clinic review tool, if applicable. No additional management support is needed unless otherwise documented below in the visit note. 

## 2015-04-01 NOTE — Patient Instructions (Signed)
Pruritus  °Pruritus is an itch. There are many different problems that can cause an itch. Dry skin is one of the most common causes of itching. Most cases of itching do not require medical attention.  °HOME CARE INSTRUCTIONS  °Make sure your skin is moistened on a regular basis. A moisturizer that contains petroleum jelly is best for keeping moisture in your skin. If you develop a rash, you may try the following for relief:  °· Use corticosteroid cream. °· Apply cool compresses to the affected areas. °· Bathe with Epsom salts or baking soda in the bathwater. °· Soak in colloidal oatmeal baths. These are available at your pharmacy. °· Apply baking soda paste to the rash. Stir water into baking soda until it reaches a paste-like consistency. °· Use an anti-itch lotion. °· Take over-the-counter diphenhydramine medicine by mouth as the instructions direct. °· Avoid scratching. Scratching may cause the rash to become infected. If itching is very bad, your caregiver may suggest prescription lotions or creams to lessen your symptoms. °· Avoid hot showers, which can make itching worse. A cold shower may help with itching as long as you use a moisturizer after the shower. °SEEK MEDICAL CARE IF: °The itching does not go away after several days. °Document Released: 08/22/2011 Document Revised: 04/26/2014 Document Reviewed: 08/22/2011 °ExitCare® Patient Information ©2015 [employer], [employer]. This information is not intended to replace advice given to you by your health care provider. Make sure you discuss any questions you have with your health care provider. ° °

## 2015-04-01 NOTE — Assessment & Plan Note (Signed)
micardis Check labs

## 2015-06-25 ENCOUNTER — Other Ambulatory Visit: Payer: Self-pay | Admitting: Family Medicine

## 2015-09-02 ENCOUNTER — Other Ambulatory Visit: Payer: Self-pay | Admitting: Family Medicine

## 2015-09-04 ENCOUNTER — Other Ambulatory Visit: Payer: Self-pay | Admitting: Family Medicine

## 2015-09-21 ENCOUNTER — Telehealth: Payer: Self-pay | Admitting: Family Medicine

## 2015-09-21 NOTE — Telephone Encounter (Signed)
Caller name: Smith,Barbara Relation to OE:[id number]  Call back number:[phone]   Reason for call:  Daughter would like to know if patient is due for pneumonia

## 2015-09-22 NOTE — Telephone Encounter (Signed)
Left detailed message on identifiable voice mail that vaccines are UTD

## 2015-10-03 ENCOUNTER — Telehealth: Payer: Self-pay | Admitting: Family Medicine

## 2015-10-03 ENCOUNTER — Ambulatory Visit: Payer: TRICARE For Life (TFL) | Admitting: Family Medicine

## 2015-10-03 ENCOUNTER — Encounter: Payer: Self-pay | Admitting: Family Medicine

## 2015-10-03 ENCOUNTER — Ambulatory Visit (INDEPENDENT_AMBULATORY_CARE_PROVIDER_SITE_OTHER): Payer: Medicare Other | Admitting: Family Medicine

## 2015-10-03 VITALS — BP 110/60 | HR 74 | Temp 99.2°F | Wt 179.2 lb

## 2015-10-03 DIAGNOSIS — I10 Essential (primary) hypertension: Secondary | ICD-10-CM

## 2015-10-03 DIAGNOSIS — N400 Enlarged prostate without lower urinary tract symptoms: Secondary | ICD-10-CM | POA: Diagnosis not present

## 2015-10-03 DIAGNOSIS — E785 Hyperlipidemia, unspecified: Secondary | ICD-10-CM

## 2015-10-03 DIAGNOSIS — Z23 Encounter for immunization: Secondary | ICD-10-CM | POA: Diagnosis not present

## 2015-10-03 DIAGNOSIS — E039 Hypothyroidism, unspecified: Secondary | ICD-10-CM

## 2015-10-03 DIAGNOSIS — E1151 Type 2 diabetes mellitus with diabetic peripheral angiopathy without gangrene: Secondary | ICD-10-CM | POA: Diagnosis not present

## 2015-10-03 LAB — CBC WITH DIFFERENTIAL/PLATELET
BASOS PCT: 0.4 % (ref 0.0–3.0)
Basophils Absolute: 0 10*3/uL (ref 0.0–0.1)
EOS PCT: 9.3 % — AB (ref 0.0–5.0)
Eosinophils Absolute: 0.5 10*3/uL (ref 0.0–0.7)
HCT: 35.2 % — ABNORMAL LOW (ref 39.0–52.0)
Hemoglobin: 11.5 g/dL — ABNORMAL LOW (ref 13.0–17.0)
LYMPHS ABS: 1.9 10*3/uL (ref 0.7–4.0)
Lymphocytes Relative: 31.7 % (ref 12.0–46.0)
MCHC: 32.7 g/dL (ref 30.0–36.0)
MCV: 100.8 fl — ABNORMAL HIGH (ref 78.0–100.0)
MONO ABS: 0.4 10*3/uL (ref 0.1–1.0)
Monocytes Relative: 6.6 % (ref 3.0–12.0)
NEUTROS ABS: 3.1 10*3/uL (ref 1.4–7.7)
NEUTROS PCT: 52 % (ref 43.0–77.0)
PLATELETS: 267 10*3/uL (ref 150.0–400.0)
RBC: 3.49 Mil/uL — ABNORMAL LOW (ref 4.22–5.81)
RDW: 17 % — AB (ref 11.5–15.5)
WBC: 5.9 10*3/uL (ref 4.0–10.5)

## 2015-10-03 LAB — LIPID PANEL
Cholesterol: 116 mg/dL (ref 0–200)
HDL: 42.2 mg/dL (ref >39.00)
LDL Cholesterol: 59 mg/dL (ref 0–99)
NONHDL: 73.65
Total CHOL/HDL Ratio: 3
Triglycerides: 71 mg/dL (ref 0.0–149.0)
VLDL: 14.2 mg/dL (ref 0.0–40.0)

## 2015-10-03 LAB — COMPREHENSIVE METABOLIC PANEL
ALT: 7 U/L (ref 0–53)
AST: 10 U/L (ref 0–37)
Albumin: 3.7 g/dL (ref 3.5–5.2)
Alkaline Phosphatase: 34 U/L — ABNORMAL LOW (ref 39–117)
BUN: 15 mg/dL (ref 6–23)
CHLORIDE: 102 meq/L (ref 96–112)
CO2: 31 mEq/L (ref 19–32)
CREATININE: 1.06 mg/dL (ref 0.40–1.50)
Calcium: 8.8 mg/dL (ref 8.4–10.5)
GFR: 70.9 mL/min (ref >60.00)
GLUCOSE: 125 mg/dL — AB (ref 70–99)
POTASSIUM: 5.1 meq/L (ref 3.5–5.1)
SODIUM: 138 meq/L (ref 135–145)
Total Bilirubin: 0.8 mg/dL (ref 0.2–1.2)
Total Protein: 6.2 g/dL (ref 6.0–8.3)

## 2015-10-03 LAB — TSH: TSH: 1.26 u[IU]/mL (ref 0.35–4.50)

## 2015-10-03 LAB — POCT URINALYSIS DIPSTICK
Bilirubin, UA: NEGATIVE
Glucose, UA: NEGATIVE
KETONES UA: NEGATIVE
Leukocytes, UA: NEGATIVE
Nitrite, UA: NEGATIVE
PROTEIN UA: NEGATIVE
RBC UA: NEGATIVE
SPEC GRAV UA: 1.025
Urobilinogen, UA: 0.2
pH, UA: 6

## 2015-10-03 LAB — MICROALBUMIN / CREATININE URINE RATIO
CREATININE, U: 170.1 mg/dL
MICROALB UR: 1.5 mg/dL (ref 0.0–1.9)
MICROALB/CREAT RATIO: 0.9 mg/g (ref 0.0–30.0)

## 2015-10-03 LAB — HEMOGLOBIN A1C: HEMOGLOBIN A1C: 5.9 % (ref 4.6–6.5)

## 2015-10-03 MED ORDER — PRAVASTATIN SODIUM 40 MG PO TABS: 40.0000 mg | ORAL_TABLET | Freq: Every day | ORAL | Status: DC

## 2015-10-03 MED ORDER — FINASTERIDE 5 MG PO TABS: ORAL_TABLET | ORAL | Status: DC

## 2015-10-03 MED ORDER — TELMISARTAN 80 MG PO TABS: ORAL_TABLET | ORAL | Status: DC

## 2015-10-03 MED ORDER — ALFUZOSIN HCL ER 10 MG PO TB24
10.0000 mg | ORAL_TABLET | Freq: Every day | ORAL | Status: DC
Start: 2015-10-03 — End: 2015-12-06

## 2015-10-03 MED ORDER — METFORMIN HCL 1000 MG PO TABS: 1000.0000 mg | ORAL_TABLET | Freq: Two times a day (BID) | ORAL | Status: DC

## 2015-10-03 MED ORDER — LEVOTHYROXINE SODIUM 75 MCG PO TABS: 75.0000 ug | ORAL_TABLET | Freq: Every day | ORAL | Status: DC

## 2015-10-03 NOTE — Telephone Encounter (Signed)
Caller name: Randye Lobo  Relation to UP:[id number]  Call back number: [phone]   Reason for call:  Daughter would like a call regarding father appointment, daughter states patient will not remember what PCP advised. Daughter was calling to ensure patient is seeing PCP/labs/flu shot.

## 2015-10-03 NOTE — Telephone Encounter (Signed)
To MD.    KP 

## 2015-10-03 NOTE — Progress Notes (Signed)
Pre visit review using our clinic review tool, if applicable. No additional management support is needed unless otherwise documented below in the visit note. 

## 2015-10-03 NOTE — Patient Instructions (Signed)

## 2015-10-03 NOTE — Telephone Encounter (Signed)
All that was done --- labs pending

## 2015-10-03 NOTE — Progress Notes (Signed)
Patient ID: Douglas Grant, male    DOB: [birth year]  Age: 81 y.o. MRN: [id number]    Subjective:  Subjective HPI Douglas Grant presents for f/u dm, bp , cholesterol and thyroid.   HYPERTENSION  Blood pressure range-not checking  Chest pain- no   Dyspnea- no Lightheadedness- no   Edema- no Other side effects - no   Medication compliance: good Low salt diet- yes  DIABETES  Blood Sugar ranges-not checking  Polyuria- no New Visual problems- no Hypoglycemic symptoms- no Other side effects-no Medication compliance - good Last eye exam- needs new eye dr Foot exam- today  HYPERLIPIDEMIA  Medication compliance- good RUQ pain- no  Muscle aches- no Other side effects-no     Review of Systems  Constitutional: Negative for diaphoresis, appetite change, fatigue and unexpected weight change.  Eyes: Negative for pain, redness and visual disturbance.  Respiratory: Negative for cough, chest tightness, shortness of breath and wheezing.   Cardiovascular: Negative for chest pain, palpitations and leg swelling.  Endocrine: Negative for cold intolerance, heat intolerance, polydipsia, polyphagia and polyuria.  Genitourinary: Negative for dysuria, frequency and difficulty urinating.  Neurological: Negative for dizziness, light-headedness, numbness and headaches.    History Past Medical History  Diagnosis Date  . Diabetes mellitus   . Hyperlipidemia   . GERD (gastroesophageal reflux disease)   . Thyroid disease   . Hypertension   . Heart murmur   . Stroke ([place]) 03/2011    R lat thalamus, post in int capsule  . Personal history of colonic polyps     Douglas Grant has past surgical history that includes Appendectomy.   His family history includes Alcohol abuse in his brother and father; Depression in his mother.Douglas Grant reports that Douglas Grant quit smoking about 30 years ago. Douglas Grant has never used smokeless tobacco. Douglas Grant reports that Douglas Grant does not drink alcohol or use illicit drugs.  Current Outpatient  Prescriptions on File Prior to Visit  Medication Sig Dispense Refill  . ammonium lactate (AMLACTIN) 12 % cream Apply topically as needed for dry skin. 385 g 0  . NEXIUM 40 MG capsule TAKE 1 CAPSULE DAILY 90 capsule 3   No current facility-administered medications on file prior to visit.     Objective:  Objective Physical Exam  Constitutional: Douglas Grant is oriented to person, place, and time. Vital signs are normal. Douglas Grant appears well-developed and well-nourished. Douglas Grant is sleeping.  HENT:  Head: Normocephalic and atraumatic.  Mouth/Throat: Oropharynx is clear and moist.  Eyes: EOM are normal. Pupils are equal, round, and reactive to light.  Neck: Normal range of motion. Neck supple. No thyromegaly present.  Cardiovascular: Normal rate and regular rhythm.   No murmur heard. Pulmonary/Chest: Effort normal and breath sounds normal. No respiratory distress. Douglas Grant has no wheezes. Douglas Grant has no rales. Douglas Grant exhibits no tenderness.  Musculoskeletal: Douglas Grant exhibits no edema or tenderness.  Neurological: Douglas Grant is alert and oriented to person, place, and time.  Skin: Skin is warm and dry.  Psychiatric: Douglas Grant has a normal mood and affect. His behavior is normal. Judgment and thought content normal.  Nursing note and vitals reviewed. Sensory exam of the foot is normal, tested with the monofilament. Good pulses, no lesions or ulcers, good peripheral pulses.  BP 110/60 mmHg  Pulse 74  Temp(Src) 99.2 F (37.3 C) (Oral)  Wt 179 lb 3.2 oz (81.285 kg)  SpO2 97% Wt Readings from Last 3 Encounters:  10/03/15 179 lb 3.2 oz (81.285 kg)  04/01/15 182 lb (82.555 kg)  11/16/14 190  lb (86.183 kg)     Lab Results  Component Value Date   WBC 6.2 03/02/2014   HGB 12.3* 03/02/2014   HCT 37.1* 03/02/2014   PLT 298.0 03/02/2014   GLUCOSE 121* 03/02/2014   CHOL 207* 03/02/2014   TRIG 78.0 03/02/2014   HDL 50.40 03/02/2014   LDLDIRECT 155.3 10/06/2010   LDLCALC 141* 03/02/2014   ALT 8 03/02/2014   AST 15 03/02/2014   NA 139  03/02/2014   K 4.4 03/02/2014   CL 103 03/02/2014   CREATININE 0.9 03/02/2014   BUN 9 03/02/2014   CO2 28 03/02/2014   TSH 1.74 08/29/2012   PSA 0.25 03/02/2014   INR 1.18 04/21/2011   HGBA1C 6.1 03/02/2014   MICROALBUR 1.1 03/02/2014    Dg Chest 2 View  11/03/2012   *RADIOLOGY REPORT*  Clinical Data: Productive cough, evaluate for pneumonia  CHEST - 2 VIEW  Comparison: 04/20/2011  Findings:  Grossly unchanged borderline enlarged cardiac silhouette.  Normal mediastinal contours.  Atherosclerotic calcifications within the thoracic aorta.  Grossly unchanged right medial basilar linear heterogeneous opacity third to represent subsegmental atelectasis. Unchanged punctate granuloma overlying the peripheral aspect the right mid lung.  No new focal airspace opacities.  The lungs appear hyperexpanded with flattening of bilateral main diaphragms.  No pleural effusion or pneumothorax.  Unchanged bones.  IMPRESSION: Hyperexpanded lungs without acute cardiopulmonary disease.   Original Report Authenticated By: Jake Seats, MD     Assessment & Plan:  Plan I have changed Douglas Grant pravastatin, levothyroxine, and alfuzosin. I am also having him maintain his ammonium lactate, NEXIUM, telmisartan, metFORMIN, and finasteride.  Meds ordered this encounter  Medications  . telmisartan (MICARDIS) 80 MG tablet    Sig: TAKE ONE TABLET BY MOUTH ONCE DAILY    Dispense:  90 tablet    Refill:  3  . pravastatin (PRAVACHOL) 40 MG tablet    Sig: Take 1 tablet (40 mg total) by mouth daily.    Dispense:  90 tablet    Refill:  1  . metFORMIN (GLUCOPHAGE) 1000 MG tablet    Sig: Take 1 tablet (1,000 mg total) by mouth 2 (two) times daily with a meal.    Dispense:  180 tablet    Refill:  3  . levothyroxine (SYNTHROID, LEVOTHROID) 75 MCG tablet    Sig: Take 1 tablet (75 mcg total) by mouth daily.    Dispense:  90 tablet    Refill:  1  . finasteride (PROSCAR) 5 MG tablet    Sig: TAKE ONE TABLET BY MOUTH EVERY  DAY    Dispense:  90 tablet    Refill:  3  . alfuzosin (UROXATRAL) 10 MG 24 hr tablet    Sig: Take 1 tablet (10 mg total) by mouth daily.    Dispense:  90 tablet    Refill:  3    Problem List Items Addressed This Visit    Essential hypertension   Relevant Medications   telmisartan (MICARDIS) 80 MG tablet   pravastatin (PRAVACHOL) 40 MG tablet   Other Relevant Orders   Comp Met (CMET)   CBC with Differential/Platelet   Microalbumin / creatinine urine ratio   POCT urinalysis dipstick (Completed)    Other Visit Diagnoses    BPH (benign prostatic hypertrophy)  (Chronic)  (Chronic)  -  Primary    Relevant Medications    finasteride (PROSCAR) 5 MG tablet    alfuzosin (UROXATRAL) 10 MG 24 hr tablet    Need for immunization  against influenza        Relevant Orders    Flu vaccine HIGH DOSE PF (Fluzone High dose) (Completed)    BPH (benign prostatic hypertrophy)        Relevant Medications    finasteride (PROSCAR) 5 MG tablet    alfuzosin (UROXATRAL) 10 MG 24 hr tablet    Hypothyroidism, unspecified hypothyroidism type        Relevant Medications    levothyroxine (SYNTHROID, LEVOTHROID) 75 MCG tablet    Other Relevant Orders    TSH    Hyperlipidemia        Relevant Medications    telmisartan (MICARDIS) 80 MG tablet    pravastatin (PRAVACHOL) 40 MG tablet    Other Relevant Orders    Lipid panel    Microalbumin / creatinine urine ratio    POCT urinalysis dipstick (Completed)    DM (diabetes mellitus) type II controlled peripheral vascular disorder (HCC)        Relevant Medications    telmisartan (MICARDIS) 80 MG tablet    pravastatin (PRAVACHOL) 40 MG tablet    metFORMIN (GLUCOPHAGE) 1000 MG tablet    Other Relevant Orders    Comp Met (CMET)    Hemoglobin A1c    Lipid panel    Microalbumin / creatinine urine ratio    POCT urinalysis dipstick (Completed)    Ambulatory referral to Podiatry    Ambulatory referral to Ophthalmology       Follow-up: Return in about 6 months  (around 04/02/2016), or if symptoms worsen or fail to improve.  Garnet Koyanagi, DO

## 2015-10-06 ENCOUNTER — Ambulatory Visit (INDEPENDENT_AMBULATORY_CARE_PROVIDER_SITE_OTHER): Payer: Medicare Other | Admitting: Podiatry

## 2015-10-06 ENCOUNTER — Encounter: Payer: Self-pay | Admitting: Podiatry

## 2015-10-06 VITALS — BP 123/68 | HR 60 | Ht 69.0 in | Wt 178.0 lb

## 2015-10-06 DIAGNOSIS — M79606 Pain in leg, unspecified: Secondary | ICD-10-CM | POA: Insufficient documentation

## 2015-10-06 DIAGNOSIS — B351 Tinea unguium: Secondary | ICD-10-CM

## 2015-10-06 HISTORY — DX: Tinea unguium: B35.1

## 2015-10-06 HISTORY — DX: Pain in leg, unspecified: M79.606

## 2015-10-06 NOTE — Progress Notes (Signed)
SUBJECTIVE: 81 y.o. year old NIDDM male presents complaining of problematic toe nails. They are painful and not able to reach down to cut. He came with his wife who had toe nail problems. Patient denies any other problems.  REVIEW OF SYSTEMS: Pertinent items noted in HPI and remainder of comprehensive ROS otherwise negative.  OBJECTIVE: DERMATOLOGIC EXAMINATION: Thick and white discolored brittle nails x 10, symptomatic.  No opening or inflammation noted.   VASCULAR EXAMINATION OF LOWER LIMBS: All pedal pulses are palpable with normal pulsation.  No edema or erythema noted.  NEUROLOGIC EXAMINATION OF THE LOWER LIMBS: All epicritic and tactile sensations grossly intact.   MUSCULOSKELETAL EXAMINATION: Rectus foot without gross deformities.  ASSESSMENT: Onychomycosis x 10. Pain in lower limbs.  PLAN: Debrided all nails x 10. May return in 3 months or as needed.

## 2015-10-06 NOTE — Patient Instructions (Signed)
Seen for hypertrophic nails. All nails debrided. Return in 3 months or as needed.  

## 2015-10-12 ENCOUNTER — Telehealth: Payer: Self-pay

## 2015-10-12 NOTE — Telephone Encounter (Signed)
Douglas Grant called for the results and I made her aware.   Notes Recorded by Rosalita Chessman, DO on 10/04/2015 at 6:25 PM Labs are good except hgb is low--- check heme cards--- pt never sent them back   The stool cards were mailed on 10/12 and she said she will check the mail, she stated she is supposed to get all of the results and information from the visit and I made her aware that I don't know what was discussed during the visit because I was not in the room during the visit. She voiced understanding, she said she would check to see if he received the stool cards so they can get them mailed back, She is aware that a copy of the labs are included.      KP

## 2015-10-16 ENCOUNTER — Other Ambulatory Visit: Payer: Self-pay | Admitting: Family Medicine

## 2015-11-15 DIAGNOSIS — H2513 Age-related nuclear cataract, bilateral: Secondary | ICD-10-CM | POA: Diagnosis not present

## 2015-11-15 DIAGNOSIS — H524 Presbyopia: Secondary | ICD-10-CM | POA: Diagnosis not present

## 2015-11-15 DIAGNOSIS — E119 Type 2 diabetes mellitus without complications: Secondary | ICD-10-CM | POA: Diagnosis not present

## 2015-11-15 DIAGNOSIS — H25013 Cortical age-related cataract, bilateral: Secondary | ICD-10-CM | POA: Diagnosis not present

## 2015-11-15 LAB — HM DIABETES EYE EXAM

## 2015-12-02 ENCOUNTER — Other Ambulatory Visit: Payer: Self-pay | Admitting: Family Medicine

## 2015-12-05 ENCOUNTER — Other Ambulatory Visit: Payer: Self-pay | Admitting: Family Medicine

## 2015-12-25 HISTORY — PX: CATARACT EXTRACTION W/ INTRAOCULAR LENS  IMPLANT, BILATERAL: SHX1307

## 2015-12-28 ENCOUNTER — Encounter: Payer: Self-pay | Admitting: Family Medicine

## 2016-01-05 ENCOUNTER — Ambulatory Visit (INDEPENDENT_AMBULATORY_CARE_PROVIDER_SITE_OTHER): Payer: Medicare Other | Admitting: Podiatry

## 2016-01-05 ENCOUNTER — Encounter: Payer: Self-pay | Admitting: Podiatry

## 2016-01-05 VITALS — BP 130/52 | HR 74

## 2016-01-05 DIAGNOSIS — M79606 Pain in leg, unspecified: Secondary | ICD-10-CM | POA: Diagnosis not present

## 2016-01-05 DIAGNOSIS — B351 Tinea unguium: Secondary | ICD-10-CM | POA: Diagnosis not present

## 2016-01-05 NOTE — Patient Instructions (Signed)
Seen for hypertrophic nails. All nails debrided. Return in 3 months or as needed.  

## 2016-01-05 NOTE — Progress Notes (Signed)
SUBJECTIVE: 82 y.o. year old NIDDM male presents complaining of problematic toe nails. They are painful and not able to reach down to cut. He came with his wife who had toe nail problems. Stated that his blood sugar is ok.   OBJECTIVE: DERMATOLOGIC EXAMINATION: Thick and white discolored brittle nails x 10, symptomatic.  No opening or inflammation noted.  VASCULAR EXAMINATION OF LOWER LIMBS: All pedal pulses are palpable with normal pulsation.  No edema or erythema noted. NEUROLOGIC EXAMINATION OF THE LOWER LIMBS: All epicritic and tactile sensations grossly intact.  MUSCULOSKELETAL EXAMINATION: Rectus foot without gross deformities.  ASSESSMENT: Onychomycosis x 10. Pain in lower limbs.  PLAN: Debrided all nails x 10. May return in 3 months or as needed

## 2016-01-12 DIAGNOSIS — H2181 Floppy iris syndrome: Secondary | ICD-10-CM | POA: Diagnosis not present

## 2016-01-12 DIAGNOSIS — E119 Type 2 diabetes mellitus without complications: Secondary | ICD-10-CM | POA: Diagnosis not present

## 2016-01-12 DIAGNOSIS — H2511 Age-related nuclear cataract, right eye: Secondary | ICD-10-CM | POA: Diagnosis not present

## 2016-01-12 DIAGNOSIS — H25011 Cortical age-related cataract, right eye: Secondary | ICD-10-CM | POA: Diagnosis not present

## 2016-01-12 DIAGNOSIS — H25811 Combined forms of age-related cataract, right eye: Secondary | ICD-10-CM | POA: Diagnosis not present

## 2016-02-13 ENCOUNTER — Other Ambulatory Visit: Payer: Self-pay | Admitting: Family Medicine

## 2016-03-09 ENCOUNTER — Ambulatory Visit (INDEPENDENT_AMBULATORY_CARE_PROVIDER_SITE_OTHER): Payer: Medicare Other | Admitting: Family Medicine

## 2016-03-09 ENCOUNTER — Encounter: Payer: Self-pay | Admitting: Family Medicine

## 2016-03-09 VITALS — BP 147/71 | HR 63 | Temp 98.0°F | Wt 176.6 lb

## 2016-03-09 DIAGNOSIS — L309 Dermatitis, unspecified: Secondary | ICD-10-CM | POA: Diagnosis not present

## 2016-03-09 MED ORDER — TRIAMCINOLONE ACETONIDE 0.1 % EX CREA: 1.0000 "application " | TOPICAL_CREAM | Freq: Two times a day (BID) | CUTANEOUS | Status: DC

## 2016-03-09 MED ORDER — HYDROXYZINE HCL 50 MG PO TABS: 50.0000 mg | ORAL_TABLET | Freq: Three times a day (TID) | ORAL | Status: DC | PRN

## 2016-03-09 MED FILL — TRIAMCINOLONE 0.1% CREAM: 0.1 | 15 days supply | Qty: 30 | Fill #0

## 2016-03-09 MED FILL — hydrOXYzine HCL 50 MG TABS: 50 | 10 days supply | Qty: 30 | Fill #0

## 2016-03-09 NOTE — Progress Notes (Signed)
Pre visit review using our clinic review tool, if applicable. No additional management support is needed unless otherwise documented below in the visit note. 

## 2016-03-09 NOTE — Patient Instructions (Signed)
Mix the triamcinolone with eucerin cream 2x a day

## 2016-03-09 NOTE — Progress Notes (Signed)
Patient ID: Douglas Grant, male    DOB: [birth year]  Age: 82 y.o. MRN: [id number]    Subjective:  Subjective HPI CYPHER LOWELL presents for rash and itching-- both sides of the back   Review of Systems  Constitutional: Negative for diaphoresis, appetite change, fatigue and unexpected weight change.  Eyes: Negative for pain, redness and visual disturbance.  Respiratory: Negative for cough, chest tightness, shortness of breath and wheezing.   Cardiovascular: Negative for chest pain, palpitations and leg swelling.  Endocrine: Negative for cold intolerance, heat intolerance, polydipsia, polyphagia and polyuria.  Genitourinary: Negative for dysuria, frequency and difficulty urinating.  Neurological: Negative for dizziness, light-headedness, numbness and headaches.    History Past Medical History  Diagnosis Date  . Diabetes mellitus   . Hyperlipidemia   . GERD (gastroesophageal reflux disease)   . Thyroid disease   . Hypertension   . Heart murmur   . Stroke ([place]) 03/2011    R lat thalamus, post in int capsule  . Personal history of colonic polyps     He has past surgical history that includes Appendectomy.   His family history includes Alcohol abuse in his brother and father; Depression in his mother.He reports that he quit smoking about 30 years ago. He has never used smokeless tobacco. He reports that he does not drink alcohol or use illicit drugs.  Current Outpatient Prescriptions on File Prior to Visit  Medication Sig Dispense Refill  . alfuzosin (UROXATRAL) 10 MG 24 hr tablet TAKE 1 TABLET DAILY 90 tablet 3  . finasteride (PROSCAR) 5 MG tablet TAKE 1 TABLET DAILY 90 tablet 1  . levothyroxine (SYNTHROID, LEVOTHROID) 75 MCG tablet TAKE 1 TABLET DAILY 90 tablet 1  . metFORMIN (GLUCOPHAGE) 1000 MG tablet TAKE 1 TABLET TWICE A DAY WITH A MEAL 180 tablet 2  . MICARDIS 80 MG tablet TAKE 1 TABLET DAILY 90 tablet 2  . NEXIUM 40 MG capsule TAKE 1 CAPSULE DAILY 90 capsule 3  .  pravastatin (PRAVACHOL) 40 MG tablet TAKE 1 TABLET DAILY 90 tablet 1   No current facility-administered medications on file prior to visit.     Objective:  Objective Physical Exam  Constitutional: He is oriented to person, place, and time. Vital signs are normal. He appears well-developed and well-nourished. He is sleeping.  HENT:  Head: Normocephalic and atraumatic.  Mouth/Throat: Oropharynx is clear and moist.  Eyes: EOM are normal. Pupils are equal, round, and reactive to light.  Neck: Normal range of motion. Neck supple. No thyromegaly present.  Cardiovascular: Normal rate and regular rhythm.   No murmur heard. Pulmonary/Chest: Effort normal and breath sounds normal. No respiratory distress. He has no wheezes. He has no rales. He exhibits no tenderness.  Musculoskeletal: He exhibits no edema or tenderness.  Neurological: He is alert and oriented to person, place, and time.  Skin: Skin is warm and dry. Rash noted. There is erythema.     Psychiatric: He has a normal mood and affect. His behavior is normal. Judgment and thought content normal.  Nursing note and vitals reviewed.  BP 147/71 mmHg  Pulse 63  Temp(Src) 98 F (36.7 C) (Oral)  Wt 176 lb 9.6 oz (80.105 kg)  SpO2 97% Wt Readings from Last 3 Encounters:  03/09/16 176 lb 9.6 oz (80.105 kg)  10/06/15 178 lb (80.74 kg)  10/03/15 179 lb 3.2 oz (81.285 kg)     Lab Results  Component Value Date   WBC 5.9 10/03/2015   HGB 11.5* 10/03/2015  HCT 35.2* 10/03/2015   PLT 267.0 10/03/2015   GLUCOSE 125* 10/03/2015   CHOL 116 10/03/2015   TRIG 71.0 10/03/2015   HDL 42.20 10/03/2015   LDLDIRECT 155.3 10/06/2010   LDLCALC 59 10/03/2015   ALT 7 10/03/2015   AST 10 10/03/2015   NA 138 10/03/2015   K 5.1 10/03/2015   CL 102 10/03/2015   CREATININE 1.06 10/03/2015   BUN 15 10/03/2015   CO2 31 10/03/2015   TSH 1.26 10/03/2015   PSA 0.25 03/02/2014   INR 1.18 04/21/2011   HGBA1C 5.9 10/03/2015   MICROALBUR 1.5  10/03/2015    Dg Chest 2 View  11/03/2012  *RADIOLOGY REPORT* Clinical Data: Productive cough, evaluate for pneumonia CHEST - 2 VIEW Comparison: 04/20/2011 Findings: Grossly unchanged borderline enlarged cardiac silhouette.  Normal mediastinal contours.  Atherosclerotic calcifications within the thoracic aorta.  Grossly unchanged right medial basilar linear heterogeneous opacity third to represent subsegmental atelectasis. Unchanged punctate granuloma overlying the peripheral aspect the right mid lung.  No new focal airspace opacities.  The lungs appear hyperexpanded with flattening of bilateral main diaphragms.  No pleural effusion or pneumothorax.  Unchanged bones. IMPRESSION: Hyperexpanded lungs without acute cardiopulmonary disease. Original Report Authenticated By: Jake Seats, MD     Assessment & Plan:  Plan I have discontinued Mr. Huckaba's ammonium lactate. I am also having him start on hydrOXYzine. Additionally, I am having him maintain his NEXIUM, metFORMIN, MICARDIS, pravastatin, levothyroxine, alfuzosin, finasteride, and triamcinolone cream.  Meds ordered this encounter  Medications  . DISCONTD: triamcinolone cream (KENALOG) 0.1 %    Sig: Apply 1 application topically 2 (two) times daily.    Dispense:  30 g    Refill:  0  . hydrOXYzine (ATARAX/VISTARIL) 50 MG tablet    Sig: Take 1 tablet (50 mg total) by mouth 3 (three) times daily as needed.    Dispense:  30 tablet    Refill:  0  . triamcinolone cream (KENALOG) 0.1 %    Sig: Apply 1 application topically 2 (two) times daily.    Dispense:  30 g    Refill:  0    Problem List Items Addressed This Visit    None    Visit Diagnoses    Dermatitis    -  Primary    Relevant Medications    hydrOXYzine (ATARAX/VISTARIL) 50 MG tablet    triamcinolone cream (KENALOG) 0.1 %       Follow-up: Return in about 3 months (around 06/09/2016), or if symptoms worsen or fail to improve.  Garnet Koyanagi, DO

## 2016-03-19 ENCOUNTER — Encounter: Payer: Self-pay | Admitting: Podiatry

## 2016-03-19 ENCOUNTER — Ambulatory Visit (INDEPENDENT_AMBULATORY_CARE_PROVIDER_SITE_OTHER): Payer: Medicare Other | Admitting: Podiatry

## 2016-03-19 VITALS — BP 105/52 | HR 77

## 2016-03-19 DIAGNOSIS — Q828 Other specified congenital malformations of skin: Secondary | ICD-10-CM

## 2016-03-19 DIAGNOSIS — M79673 Pain in unspecified foot: Secondary | ICD-10-CM | POA: Diagnosis not present

## 2016-03-19 DIAGNOSIS — M79606 Pain in leg, unspecified: Secondary | ICD-10-CM

## 2016-03-19 DIAGNOSIS — B351 Tinea unguium: Secondary | ICD-10-CM

## 2016-03-19 NOTE — Patient Instructions (Signed)
Seen for hypertrophic nails and calluses. All nails and calluses debrided. Return in 3 months or as needed.  

## 2016-03-19 NOTE — Progress Notes (Signed)
SUBJECTIVE: 82 y.o. year old NIDDM male presents complaining of problematic toe nails and painful hard sports that are painful to walk. They are painful and not able to reach down to cut. He came with his wife who had toe nail problems. Stated that his blood sugar is ok.   OBJECTIVE: DERMATOLOGIC EXAMINATION: Thick and white discolored brittle nails x 10, symptomatic.  Painful porokeratotic lesion under 1st and 5th MPJ bilateral.  No opening or inflammation noted.  VASCULAR EXAMINATION OF LOWER LIMBS: All pedal pulses are palpable with normal pulsation.  No edema or erythema noted. NEUROLOGIC EXAMINATION OF THE LOWER LIMBS: All epicritic and tactile sensations grossly intact.  MUSCULOSKELETAL EXAMINATION: Rectus foot without gross deformities.  ASSESSMENT: Onychomycosis x 10. Porokeratosis sub 1 and 5 bilateral.  Pain in lower limbs.  PLAN: Debrided all nails, corns and calluses bilateral. May return in 3 months or as needed

## 2016-04-04 ENCOUNTER — Ambulatory Visit: Payer: Medicare Other | Admitting: Podiatry

## 2016-04-19 DIAGNOSIS — H25012 Cortical age-related cataract, left eye: Secondary | ICD-10-CM | POA: Diagnosis not present

## 2016-04-19 DIAGNOSIS — H25812 Combined forms of age-related cataract, left eye: Secondary | ICD-10-CM | POA: Diagnosis not present

## 2016-04-19 DIAGNOSIS — H25042 Posterior subcapsular polar age-related cataract, left eye: Secondary | ICD-10-CM | POA: Diagnosis not present

## 2016-04-19 DIAGNOSIS — H2512 Age-related nuclear cataract, left eye: Secondary | ICD-10-CM | POA: Diagnosis not present

## 2016-05-28 ENCOUNTER — Other Ambulatory Visit: Payer: Self-pay | Admitting: Family Medicine

## 2016-06-12 ENCOUNTER — Encounter: Payer: Self-pay | Admitting: Family Medicine

## 2016-06-12 ENCOUNTER — Ambulatory Visit (INDEPENDENT_AMBULATORY_CARE_PROVIDER_SITE_OTHER): Payer: Medicare Other | Admitting: Family Medicine

## 2016-06-12 VITALS — BP 118/50 | HR 79 | Temp 97.9°F | Ht 67.0 in | Wt 172.2 lb

## 2016-06-12 DIAGNOSIS — N400 Enlarged prostate without lower urinary tract symptoms: Secondary | ICD-10-CM | POA: Diagnosis not present

## 2016-06-12 DIAGNOSIS — D229 Melanocytic nevi, unspecified: Secondary | ICD-10-CM

## 2016-06-12 DIAGNOSIS — E1151 Type 2 diabetes mellitus with diabetic peripheral angiopathy without gangrene: Secondary | ICD-10-CM

## 2016-06-12 DIAGNOSIS — I10 Essential (primary) hypertension: Secondary | ICD-10-CM | POA: Diagnosis not present

## 2016-06-12 DIAGNOSIS — E1165 Type 2 diabetes mellitus with hyperglycemia: Secondary | ICD-10-CM

## 2016-06-12 DIAGNOSIS — E785 Hyperlipidemia, unspecified: Secondary | ICD-10-CM

## 2016-06-12 DIAGNOSIS — E039 Hypothyroidism, unspecified: Secondary | ICD-10-CM | POA: Diagnosis not present

## 2016-06-12 DIAGNOSIS — Z0000 Encounter for general adult medical examination without abnormal findings: Secondary | ICD-10-CM

## 2016-06-12 DIAGNOSIS — IMO0002 Reserved for concepts with insufficient information to code with codable children: Secondary | ICD-10-CM

## 2016-06-12 DIAGNOSIS — R197 Diarrhea, unspecified: Secondary | ICD-10-CM

## 2016-06-12 MED ORDER — HYOSCYAMINE SULFATE 0.125 MG SL SUBL: 0.1250 mg | SUBLINGUAL_TABLET | SUBLINGUAL | Status: DC | PRN

## 2016-06-12 MED ORDER — TELMISARTAN 80 MG PO TABS: 80.0000 mg | ORAL_TABLET | Freq: Every day | ORAL | Status: DC

## 2016-06-12 MED ORDER — METFORMIN HCL 1000 MG PO TABS: ORAL_TABLET | ORAL | Status: DC

## 2016-06-12 MED ORDER — FINASTERIDE 5 MG PO TABS: 5.0000 mg | ORAL_TABLET | Freq: Every day | ORAL | Status: DC

## 2016-06-12 MED ORDER — LEVOTHYROXINE SODIUM 75 MCG PO TABS: 75.0000 ug | ORAL_TABLET | Freq: Every day | ORAL | Status: DC

## 2016-06-12 MED ORDER — PRAVASTATIN SODIUM 40 MG PO TABS: 40.0000 mg | ORAL_TABLET | Freq: Every day | ORAL | Status: DC

## 2016-06-12 MED FILL — OSCIMIN SL 0.125 MG TABLET: 0.125 | 5 days supply | Qty: 30 | Fill #0

## 2016-06-12 NOTE — Patient Instructions (Signed)

## 2016-06-12 NOTE — Progress Notes (Signed)
Pre visit review using our clinic review tool, if applicable. No additional management support is needed unless otherwise documented below in the visit note. 

## 2016-06-12 NOTE — Progress Notes (Signed)
Subjective:   Douglas Grant is a 82 y.o. male who presents for Medicare Annual/Subsequent preventive examination.  Review of Systems:   Review of Systems  Constitutional: Negative for activity change, appetite change and fatigue.  HENT: Negative for hearing loss, congestion, tinnitus and ear discharge.   Eyes: Negative for visual disturbance (see optho q1y -- vision corrected to 20/20 with glasses).  Respiratory: Negative for cough, chest tightness and shortness of breath.   Cardiovascular: Negative for chest pain, palpitations and leg swelling.  Gastrointestinal: Negative for abdominal pain constipation and abdominal distention.  + diarrhea x2 episodes Genitourinary: Negative for urgency, frequency, decreased urine volume and difficulty urinating.  Musculoskeletal: Negative for back pain, arthralgias and gait problem.  Skin: Negative for color change, pallor and rash.  Neurological: Negative for dizziness, light-headedness, numbness and headaches.  Hematological: Negative for adenopathy. Does not bruise/bleed easily.  Psychiatric/Behavioral: Negative for suicidal ideas, confusion, sleep disturbance, self-injury, dysphoric mood, decreased concentration and agitation.  Pt is able to read and write and can do all ADLs No risk for falling No abuse/ violence in home     Cardiac Risk Factors include: advanced age (>11men, >55 women);dyslipidemia;diabetes mellitus;hypertension;male gender;sedentary lifestyle     Objective:    Vitals: BP 118/50 mmHg  Pulse 79  Temp(Src) 97.9 F (36.6 C) (Oral)  Ht 5\' 7"  (1.702 m)  Wt 172 lb 3.2 oz (78.109 kg)  BMI 26.96 kg/m2  SpO2 97%  Body mass index is 26.96 kg/(m^2). BP 118/50 mmHg  Pulse 79  Temp(Src) 97.9 F (36.6 C) (Oral)  Ht 5\' 7"  (1.702 m)  Wt 172 lb 3.2 oz (78.109 kg)  BMI 26.96 kg/m2  SpO2 97% General appearance: alert, cooperative, appears stated age and no distress Head: Normocephalic, without obvious abnormality,  atraumatic Eyes: negative findings: lids and lashes normal, conjunctivae and sclerae normal and pupils equal, round, reactive to light and accomodation Ears: normal TM's and external ear canals both ears Nose: Nares normal. Septum midline. Mucosa normal. No drainage or sinus tenderness. Throat: lips, mucosa, and tongue normal; teeth and gums normal Neck: no adenopathy, no carotid bruit, no JVD, supple, symmetrical, trachea midline and thyroid not enlarged, symmetric, no tenderness/mass/nodules Back: symmetric, no curvature. ROM normal. No CVA tenderness. Lungs: clear to auscultation bilaterally Chest wall: no tenderness Heart: regular rate and rhythm, S1, S2 normal, no murmur, click, rub or gallop Abdomen: soft, non-tender; bowel sounds normal; no masses,  no organomegaly Male genitalia: normal Rectal: normal tone, normal prostate, no masses or tenderness and soft brown guaiac negative stool noted Extremities: extremities normal, atraumatic, no cyanosis or edema Pulses: 2+ and symmetric Skin: Skin color, texture, turgor normal. No rashes or lesions--- + keratosis Lymph nodes: Cervical, supraclavicular, and axillary nodes normal. Neurologic: Alert and oriented X 3, normal strength and tone. Normal symmetric reflexes. Normal coordination and gait Tobacco History  Smoking status  . Former Smoker  . Quit date: 04/18/1985  Smokeless tobacco  . Never Used     Counseling given: Not Answered   Past Medical History  Diagnosis Date  . Diabetes mellitus   . Hyperlipidemia   . GERD (gastroesophageal reflux disease)   . Thyroid disease   . Hypertension   . Heart murmur   . Stroke ([place]) 03/2011    R lat thalamus, post in int capsule  . Personal history of colonic polyps    Past Surgical History  Procedure Laterality Date  . Appendectomy     Family History  Problem Relation Age  of Onset  . Depression Mother   . Alcohol abuse Father   . Alcohol abuse Brother    History  Sexual  Activity  . Sexual Activity: Not on file    Outpatient Encounter Prescriptions as of 06/12/2016  Medication Sig  . alfuzosin (UROXATRAL) 10 MG 24 hr tablet TAKE 1 TABLET DAILY  . finasteride (PROSCAR) 5 MG tablet Take 1 tablet (5 mg total) by mouth daily.  . hydrOXYzine (ATARAX/VISTARIL) 50 MG tablet Take 1 tablet (50 mg total) by mouth 3 (three) times daily as needed.  Marland Kitchen levothyroxine (SYNTHROID, LEVOTHROID) 75 MCG tablet Take 1 tablet (75 mcg total) by mouth daily.  . metFORMIN (GLUCOPHAGE) 1000 MG tablet TAKE 1 TABLET TWICE A DAY WITH A MEAL  . NEXIUM 40 MG capsule TAKE 1 CAPSULE DAILY  . pravastatin (PRAVACHOL) 40 MG tablet Take 1 tablet (40 mg total) by mouth daily. Repeat labs are due now  . telmisartan (MICARDIS) 80 MG tablet Take 1 tablet (80 mg total) by mouth daily.  Marland Kitchen triamcinolone cream (KENALOG) 0.1 % Apply 1 application topically 2 (two) times daily.  . [DISCONTINUED] finasteride (PROSCAR) 5 MG tablet TAKE 1 TABLET DAILY  . [DISCONTINUED] levothyroxine (SYNTHROID, LEVOTHROID) 75 MCG tablet TAKE 1 TABLET DAILY  . [DISCONTINUED] metFORMIN (GLUCOPHAGE) 1000 MG tablet TAKE 1 TABLET TWICE A DAY WITH A MEAL  . [DISCONTINUED] MICARDIS 80 MG tablet TAKE 1 TABLET DAILY  . [DISCONTINUED] pravastatin (PRAVACHOL) 40 MG tablet Take 1 tablet (40 mg total) by mouth daily. Repeat labs are due now  . hyoscyamine (LEVSIN/SL) 0.125 MG SL tablet Place 1 tablet (0.125 mg total) under the tongue every 4 (four) hours as needed.   No facility-administered encounter medications on file as of 06/12/2016.    Activities of Daily Living In your present state of health, do you have any difficulty performing the following activities: 06/12/2016  Hearing? N  Vision? N  Difficulty concentrating or making decisions? N  Walking or climbing stairs? N  Dressing or bathing? N  Doing errands, shopping? N  Preparing Food and eating ? N  Using the Toilet? N  In the past six months, have you accidently leaked  urine? N  Do you have problems with loss of bowel control? N  Managing your Medications? N  Managing your Finances? N  Housekeeping or managing your Housekeeping? N    Patient Care Team: Ann Held, DO as PCP - General Marygrace Drought, MD as Consulting Physician (Ophthalmology)   Assessment:    CPE Exercise Activities and Dietary recommendations Type of exercise: walking, Intensity: Mild, Exercise limited by: None identified  Goals    None     Fall Risk Fall Risk  06/12/2016 11/16/2014 11/16/2013  Falls in the past year? No No No   Depression Screen PHQ 2/9 Scores 06/12/2016 11/16/2014 11/16/2013 02/26/2012  PHQ - 2 Score 0 0 0 0    Cognitive Testing MMSE - Mini Mental State Exam 06/12/2016  Orientation to time 5  Orientation to Place 5  Registration 3  Attention/ Calculation 5  Recall 3  Language- name 2 objects 2  Language- repeat 1  Language- follow 3 step command 3  Language- read & follow direction 1  Write a sentence 1  Copy design 1  Total score 30    Immunization History  Administered Date(s) Administered  . Influenza Split 11/27/2011, 10/21/2012  . Influenza Whole 10/09/2005, 11/10/2007, 09/20/2008, 10/10/2009, 08/21/2010  . Influenza, High Dose Seasonal PF 10/08/2013, 10/03/2015  .  Influenza,inj,Quad PF,36+ Mos 10/12/2014  . Pneumococcal Conjugate-13 11/16/2014  . Pneumococcal Polysaccharide-23 05/21/2006  . Td 06/04/2005, 06/27/2010   Screening Tests Health Maintenance  Topic Date Due  . ZOSTAVAX  09/01/1992  . COLONOSCOPY  08/09/2013  . HEMOGLOBIN A1C  04/02/2016  . INFLUENZA VACCINE  07/24/2016  . OPHTHALMOLOGY EXAM  11/14/2016  . FOOT EXAM  06/12/2017  . TETANUS/TDAP  06/27/2020  . PNA vac Low Risk Adult  Completed      Plan:    During the course of the visit the patient was educated and counseled about the following appropriate screening and preventive services:   Vaccines to include Pneumoccal, Influenza, Hepatitis B, Td,  Zostavax, HCV  Electrocardiogram  Cardiovascular Disease  Colorectal cancer screening  Diabetes screening  Prostate Cancer Screening  Glaucoma screening  Nutrition counseling   Smoking cessation counseling  Patient Instructions (the written plan) was given to the patient.  1. Hypothyroidism, unspecified hypothyroidism type con't synthroid - TSH  2. Hyperlipidemia Check labs con't pravachol  - Comprehensive metabolic panel - Lipid panel  3. BPH (benign prostatic hypertrophy) Per urology - PSA  4. Essential hypertension stable - Comprehensive metabolic panel - CBC with Differential/Platelet  5. DM (diabetes mellitus) type II uncontrolled, periph vascular disorder ([place]) con't metformin--- pt has been on for years - Hemoglobin A1c - Ambulatory referral to Ophthalmology  6. Numerous moles  - Ambulatory referral to Dermatology  7. Diarrhea, unspecified type ?etiology - Clostridium difficile EIA - Stool culture - hyoscyamine (LEVSIN/SL) 0.125 MG SL tablet; Place 1 tablet (0.125 mg total) under the tongue every 4 (four) hours as needed.  Dispense: 30 tablet; Refill: 0 - Fecal occult blood, imunochemical; Future  8. Medicare annual wellness visit, subsequent   - Fecal occult blood, imunochemical; Future  9. Routine history and physical examination of adult     Ann Held, DO  06/12/2016

## 2016-06-13 LAB — CBC WITH DIFFERENTIAL/PLATELET
Basophils Absolute: 0 10*3/uL (ref 0.0–0.1)
Basophils Relative: 0.5 % (ref 0.0–3.0)
EOS ABS: 0.6 10*3/uL (ref 0.0–0.7)
EOS PCT: 9.5 % — AB (ref 0.0–5.0)
HCT: 31.1 % — ABNORMAL LOW (ref 39.0–52.0)
HEMOGLOBIN: 10.1 g/dL — AB (ref 13.0–17.0)
LYMPHS PCT: 32.5 % (ref 12.0–46.0)
Lymphs Abs: 1.9 10*3/uL (ref 0.7–4.0)
MCHC: 32.4 g/dL (ref 30.0–36.0)
MCV: 113.7 fl — ABNORMAL HIGH (ref 78.0–100.0)
MONOS PCT: 6.2 % (ref 3.0–12.0)
Monocytes Absolute: 0.4 10*3/uL (ref 0.1–1.0)
Neutro Abs: 3 10*3/uL (ref 1.4–7.7)
Neutrophils Relative %: 51.3 % (ref 43.0–77.0)
Platelets: 254 10*3/uL (ref 150.0–400.0)
RDW: 16.7 % — AB (ref 11.5–15.5)
WBC: 5.8 10*3/uL (ref 4.0–10.5)

## 2016-06-13 LAB — COMPREHENSIVE METABOLIC PANEL
ALBUMIN: 4 g/dL (ref 3.5–5.2)
ALK PHOS: 39 U/L (ref 39–117)
ALT: 6 U/L (ref 0–53)
AST: 10 U/L (ref 0–37)
BILIRUBIN TOTAL: 0.9 mg/dL (ref 0.2–1.2)
BUN: 18 mg/dL (ref 6–23)
CALCIUM: 8.9 mg/dL (ref 8.4–10.5)
CO2: 29 mEq/L (ref 19–32)
Chloride: 100 mEq/L (ref 96–112)
Creatinine, Ser: 1.06 mg/dL (ref 0.40–1.50)
GFR: 70.78 mL/min (ref >60.00)
GLUCOSE: 85 mg/dL (ref 70–99)
POTASSIUM: 4.5 meq/L (ref 3.5–5.1)
Sodium: 136 mEq/L (ref 135–145)
TOTAL PROTEIN: 6.1 g/dL (ref 6.0–8.3)

## 2016-06-13 LAB — LIPID PANEL
CHOLESTEROL: 117 mg/dL (ref 0–200)
HDL: 44 mg/dL (ref >39.00)
LDL CALC: 58 mg/dL (ref 0–99)
NonHDL: 73.04
TRIGLYCERIDES: 76 mg/dL (ref 0.0–149.0)
Total CHOL/HDL Ratio: 3
VLDL: 15.2 mg/dL (ref 0.0–40.0)

## 2016-06-13 LAB — PSA: PSA: 0.16 ng/mL (ref 0.10–4.00)

## 2016-06-13 LAB — TSH: TSH: 1.51 u[IU]/mL (ref 0.35–4.50)

## 2016-06-13 LAB — HEMOGLOBIN A1C: HEMOGLOBIN A1C: 5.6 % (ref 4.6–6.5)

## 2016-06-19 ENCOUNTER — Ambulatory Visit: Payer: Medicare Other | Admitting: Podiatry

## 2016-07-02 ENCOUNTER — Telehealth: Payer: Self-pay | Admitting: Family Medicine

## 2016-07-02 NOTE — Telephone Encounter (Signed)
Derm referral was put in last ov -- appointment was made

## 2016-07-02 NOTE — Telephone Encounter (Signed)
Please advise      KP 

## 2016-07-02 NOTE — Telephone Encounter (Signed)
Please check the status on the referral.    KP

## 2016-07-02 NOTE — Telephone Encounter (Signed)
Caller name: Fue  Relation to pt: self Call back [place]:  Reason for call: Pt came in office requesting is wanting to have a referral for Dermatologist (ASAP) since pt is having issues on both sides of body and states already spoke with provider about the situation and is wanting to be taken care of ASAP.(Pt states it is emergency for him to be referral ). Please advise.

## 2016-07-03 ENCOUNTER — Telehealth: Payer: Self-pay | Admitting: Family Medicine

## 2016-07-03 NOTE — Telephone Encounter (Signed)
Please advise      KP 

## 2016-07-03 NOTE — Telephone Encounter (Signed)
[facility]/Dr Daine Floras is awaiting return call from patient to schedule  appt

## 2016-07-03 NOTE — Telephone Encounter (Signed)
Detailed message left for Douglas Grant making her aware of the below information, I advised to call back with questions or concerns.    KP

## 2016-07-03 NOTE — Telephone Encounter (Signed)
Omeprazole, protonix, dexilant

## 2016-07-03 NOTE — Telephone Encounter (Signed)
Pt's daughter [name] called in because she says that pt uses mail order. She says that they are no longer going to cover Nexium. She says that provider mentioned 3 alternatives. She would like the names so that she could call to see if they will be covered.    Please advise.    CB [phone]

## 2016-07-04 ENCOUNTER — Ambulatory Visit (INDEPENDENT_AMBULATORY_CARE_PROVIDER_SITE_OTHER): Payer: Medicare Other | Admitting: Podiatry

## 2016-07-04 ENCOUNTER — Encounter: Payer: Self-pay | Admitting: Podiatry

## 2016-07-04 VITALS — BP 107/47 | HR 69

## 2016-07-04 DIAGNOSIS — B351 Tinea unguium: Secondary | ICD-10-CM

## 2016-07-04 DIAGNOSIS — Q828 Other specified congenital malformations of skin: Secondary | ICD-10-CM

## 2016-07-04 DIAGNOSIS — M79606 Pain in leg, unspecified: Secondary | ICD-10-CM

## 2016-07-04 NOTE — Progress Notes (Signed)
SUBJECTIVE: 82 y.o. year old NIDDM male presents complaining of problematic toe nails. They are painful to walk. They are painful and not able to reach down to cut. He came with his wife who had toe nail problems. Stated that his blood sugar is ok.   OBJECTIVE: DERMATOLOGIC EXAMINATION: Thick and white discolored brittle nails x 10, symptomatic.  Painful porokeratotic lesion under 1st and 5th MPJ bilateral.  No opening or inflammation noted.  VASCULAR EXAMINATION OF LOWER LIMBS: All pedal pulses are palpable with normal pulsation.  No edema or erythema noted. NEUROLOGIC EXAMINATION OF THE LOWER LIMBS: All epicritic and tactile sensations grossly intact.  MUSCULOSKELETAL EXAMINATION: Rectus foot without gross deformities.  ASSESSMENT: Onychomycosis x 10. Porokeratosis sub 1 and 5 bilateral.  Pain in lower limbs.  PLAN: Debrided all nails, corns and calluses bilateral. May return in 3 months or as needed

## 2016-07-04 NOTE — Patient Instructions (Signed)
Seen for hypertrophic nails. All nails debrided. Return in 3 months or as needed.  

## 2016-07-09 DIAGNOSIS — L299 Pruritus, unspecified: Secondary | ICD-10-CM | POA: Diagnosis not present

## 2016-07-09 DIAGNOSIS — L820 Inflamed seborrheic keratosis: Secondary | ICD-10-CM | POA: Diagnosis not present

## 2016-07-10 ENCOUNTER — Telehealth: Payer: Self-pay | Admitting: *Deleted

## 2016-07-10 MED ORDER — OMEPRAZOLE 40 MG PO CPDR: 40.0000 mg | DELAYED_RELEASE_CAPSULE | Freq: Every day | ORAL | Status: DC

## 2016-07-10 NOTE — Telephone Encounter (Signed)
Called and [facility] @ 3:32pm @ [phone]) asking the pt's daughter(Barbara) to RTC regarding medication refill request.  Needing to know if the pt is taking the Omeprazole.  On the medication list it shows that the Omeprazole was discontinued and he was placed on Nexium 40mg .//AB/CMA

## 2016-07-10 NOTE — Telephone Encounter (Signed)
[facility].    KP

## 2016-07-10 NOTE — Telephone Encounter (Signed)
Ewing Schlein, CMA at 07/03/2016 6:02 PM     Status: Signed       Expand All Collapse All   Detailed message left for [facility] making her aware of the below information, I advised to call back with questions or concerns. Clinton, DO at 07/03/2016 12:25 PM     Status: Signed       Expand All Collapse All   Omeprazole, protonix, dexilant       Daughter is requesting the Omeprazole to replace the Nexium b/c the insurance company will not pay for the Nexium.      KP

## 2016-07-10 NOTE — Telephone Encounter (Signed)
Ok to send omeprazole 40 mg #30  1 po qd, 11 refills

## 2016-07-13 ENCOUNTER — Other Ambulatory Visit: Payer: Self-pay | Admitting: Family Medicine

## 2016-07-30 DIAGNOSIS — L820 Inflamed seborrheic keratosis: Secondary | ICD-10-CM | POA: Diagnosis not present

## 2016-07-30 DIAGNOSIS — L299 Pruritus, unspecified: Secondary | ICD-10-CM | POA: Diagnosis not present

## 2016-08-21 DIAGNOSIS — L299 Pruritus, unspecified: Secondary | ICD-10-CM | POA: Diagnosis not present

## 2016-08-21 DIAGNOSIS — L820 Inflamed seborrheic keratosis: Secondary | ICD-10-CM | POA: Diagnosis not present

## 2016-08-28 ENCOUNTER — Other Ambulatory Visit: Payer: Self-pay | Admitting: Family Medicine

## 2016-08-28 DIAGNOSIS — N400 Enlarged prostate without lower urinary tract symptoms: Secondary | ICD-10-CM

## 2016-08-29 ENCOUNTER — Other Ambulatory Visit: Payer: Self-pay | Admitting: Family Medicine

## 2016-08-30 ENCOUNTER — Other Ambulatory Visit: Payer: Self-pay

## 2016-08-30 DIAGNOSIS — I10 Essential (primary) hypertension: Secondary | ICD-10-CM

## 2016-08-30 DIAGNOSIS — E1165 Type 2 diabetes mellitus with hyperglycemia: Principal | ICD-10-CM

## 2016-08-30 DIAGNOSIS — IMO0002 Reserved for concepts with insufficient information to code with codable children: Secondary | ICD-10-CM

## 2016-08-30 DIAGNOSIS — E1151 Type 2 diabetes mellitus with diabetic peripheral angiopathy without gangrene: Secondary | ICD-10-CM

## 2016-08-30 MED ORDER — TELMISARTAN 80 MG PO TABS: 80.0000 mg | ORAL_TABLET | Freq: Every day | ORAL | 2 refills | Status: DC

## 2016-08-30 MED ORDER — METFORMIN HCL 1000 MG PO TABS: ORAL_TABLET | ORAL | 2 refills | Status: DC

## 2016-09-05 ENCOUNTER — Telehealth: Payer: Self-pay | Admitting: Family Medicine

## 2016-09-05 NOTE — Telephone Encounter (Signed)
I called [facility] on [address] and they advised HD flu shot given on 08/27/16. Chart updated and they will fax over the information.     KP

## 2016-09-05 NOTE — Telephone Encounter (Signed)
Pt's daughter called in to make provider aware that pt had her flu shot at [facility] on [address]. Shes not sure of the date but says that it was a week ago

## 2016-09-13 ENCOUNTER — Encounter: Payer: Self-pay | Admitting: Family Medicine

## 2016-09-13 ENCOUNTER — Ambulatory Visit (INDEPENDENT_AMBULATORY_CARE_PROVIDER_SITE_OTHER): Payer: Medicare Other | Admitting: Family Medicine

## 2016-09-13 VITALS — BP 118/75 | HR 83 | Temp 98.8°F | Resp 16 | Ht 67.0 in | Wt 172.6 lb

## 2016-09-13 DIAGNOSIS — L820 Inflamed seborrheic keratosis: Secondary | ICD-10-CM | POA: Diagnosis not present

## 2016-09-13 DIAGNOSIS — R21 Rash and other nonspecific skin eruption: Secondary | ICD-10-CM

## 2016-09-13 DIAGNOSIS — L850 Acquired ichthyosis: Secondary | ICD-10-CM

## 2016-09-13 DIAGNOSIS — L432 Lichenoid drug reaction: Secondary | ICD-10-CM | POA: Diagnosis not present

## 2016-09-13 DIAGNOSIS — D649 Anemia, unspecified: Secondary | ICD-10-CM | POA: Diagnosis not present

## 2016-09-13 DIAGNOSIS — L821 Other seborrheic keratosis: Secondary | ICD-10-CM | POA: Diagnosis not present

## 2016-09-13 DIAGNOSIS — L853 Xerosis cutis: Secondary | ICD-10-CM

## 2016-09-13 MED ORDER — AMMONIUM LACTATE 5 % EX LOTN: 1.0000 "application " | TOPICAL_LOTION | Freq: Two times a day (BID) | CUTANEOUS | 0 refills | Status: DC

## 2016-09-13 NOTE — Progress Notes (Signed)
Patient ID: Douglas Grant, male    DOB: Jul 05, 1930  Age: 82 y.o. MRN: [id number]    Subjective:  Subjective  HPI Douglas Grant presents for itchy rash -- he saw the derm but was just given cream.  It is no better.   Review of Systems  Constitutional: Negative for appetite change, diaphoresis, fatigue and unexpected weight change.  Eyes: Negative for pain, redness and visual disturbance.  Respiratory: Negative for cough, chest tightness, shortness of breath and wheezing.   Cardiovascular: Negative for chest pain, palpitations and leg swelling.  Endocrine: Negative for cold intolerance, heat intolerance, polydipsia, polyphagia and polyuria.  Genitourinary: Negative for difficulty urinating, dysuria and frequency.  Skin: Positive for rash.  Neurological: Negative for dizziness, light-headedness, numbness and headaches.    History Past Medical History:  Diagnosis Date  . Diabetes mellitus   . GERD (gastroesophageal reflux disease)   . Heart murmur   . Hyperlipidemia   . Hypertension   . Personal history of colonic polyps   . Stroke ([place]) 03/2011   R lat thalamus, post in int capsule  . Thyroid disease     He has a past surgical history that includes Appendectomy.   His family history includes Alcohol abuse in his brother and father; Depression in his mother.He reports that he quit smoking about 31 years ago. He has never used smokeless tobacco. He reports that he does not drink alcohol or use drugs.  Current Outpatient Prescriptions on File Prior to Visit  Medication Sig Dispense Refill  . alfuzosin (UROXATRAL) 10 MG 24 hr tablet TAKE 1 TABLET DAILY 90 tablet 3  . finasteride (PROSCAR) 5 MG tablet TAKE 1 TABLET DAILY 90 tablet 1  . hydrOXYzine (ATARAX/VISTARIL) 50 MG tablet Take 1 tablet (50 mg total) by mouth 3 (three) times daily as needed. 30 tablet 0  . hyoscyamine (LEVSIN/SL) 0.125 MG SL tablet Place 1 tablet (0.125 mg total) under the tongue every 4 (four) hours as needed.  30 tablet 0  . levothyroxine (SYNTHROID, LEVOTHROID) 75 MCG tablet TAKE 1 TABLET DAILY 90 tablet 0  . metFORMIN (GLUCOPHAGE) 1000 MG tablet TAKE 1 TABLET TWICE A DAY WITH A MEAL 180 tablet 2  . omeprazole (PRILOSEC) 40 MG capsule Take 1 capsule (40 mg total) by mouth daily. 90 capsule 3  . pravastatin (PRAVACHOL) 40 MG tablet Take 1 tablet (40 mg total) by mouth daily. Repeat labs are due now 90 tablet 0  . pravastatin (PRAVACHOL) 40 MG tablet TAKE 1 TABLET DAILY (REPEAT LABS ARE DUE NOW) 90 tablet 0  . telmisartan (MICARDIS) 80 MG tablet Take 1 tablet (80 mg total) by mouth daily. 90 tablet 2  . triamcinolone cream (KENALOG) 0.1 % Apply 1 application topically 2 (two) times daily. 30 g 0   No current facility-administered medications on file prior to visit.      Objective:  Objective  Physical Exam  Skin:     area cleaned with alcohol and scalpel used to shave off and silver nitrate used to stop bleeding bandaid in place BP 118/75 (BP Location: Left Arm, Patient Position: Sitting, Cuff Size: Normal)   Pulse 83   Temp 98.8 F (37.1 C) (Oral)   Resp 16   Ht 5\' 7"  (1.702 m)   Wt 172 lb 9.6 oz (78.3 kg)   SpO2 96%   BMI 27.03 kg/m  Wt Readings from Last 3 Encounters:  09/13/16 172 lb 9.6 oz (78.3 kg)  06/12/16 172 lb 3.2 oz (78.1 kg)  03/09/16 176 lb 9.6 oz (80.1 kg)  .   Lab Results  Component Value Date   WBC 6.0 09/13/2016   HGB 10.9 (L) 09/13/2016   HCT 32.3 (L) 09/13/2016   PLT 240.0 09/13/2016   GLUCOSE 85 06/12/2016   CHOL 117 06/12/2016   TRIG 76.0 06/12/2016   HDL 44.00 06/12/2016   LDLDIRECT 155.3 10/06/2010   LDLCALC 58 06/12/2016   ALT 6 06/12/2016   AST 10 06/12/2016   NA 136 06/12/2016   K 4.5 06/12/2016   CL 100 06/12/2016   CREATININE 1.06 06/12/2016   BUN 18 06/12/2016   CO2 29 06/12/2016   TSH 1.51 06/12/2016   PSA 0.16 06/12/2016   INR 1.18 04/21/2011   HGBA1C 5.6 06/12/2016   MICROALBUR 1.5 10/03/2015    Dg Chest 2 View  Result Date:  11/03/2012 *RADIOLOGY REPORT* Clinical Data: Productive cough, evaluate for pneumonia CHEST - 2 VIEW Comparison: 04/20/2011 Findings: Grossly unchanged borderline enlarged cardiac silhouette.  Normal mediastinal contours.  Atherosclerotic calcifications within the thoracic aorta.  Grossly unchanged right medial basilar linear heterogeneous opacity third to represent subsegmental atelectasis. Unchanged punctate granuloma overlying the peripheral aspect the right mid lung.  No new focal airspace opacities.  The lungs appear hyperexpanded with flattening of bilateral main diaphragms.  No pleural effusion or pneumothorax.  Unchanged bones. IMPRESSION: Hyperexpanded lungs without acute cardiopulmonary disease. Original Report Authenticated By: Jake Seats, MD     Assessment & Plan:  Plan  I am having Mr. Mom start on ammonium lactate. I am also having him maintain his alfuzosin, hydrOXYzine, triamcinolone cream, hyoscyamine, pravastatin, omeprazole, finasteride, levothyroxine, pravastatin, metFORMIN, and telmisartan.  Meds ordered this encounter  Medications  . ammonium lactate (LAC-HYDRIN FIVE) 5 % LOTN lotion    Sig: Apply 1 application topically 2 (two) times daily.    Dispense:  340 mL    Refill:  0    Problem List Items Addressed This Visit      Unprioritized   Rash and nonspecific skin eruption    Biopsy done con't cream , hydroxyzine Pt may need to go back to derm       Other Visit Diagnoses    Inflamed seborrheic keratosis    -  Primary   Relevant Orders   Dermatology pathology   Dry skin dermatitis       Relevant Medications   ammonium lactate (LAC-HYDRIN FIVE) 5 % LOTN lotion   Anemia, unspecified anemia type       Relevant Orders   CBC with Differential/Platelet (Completed)   IBC panel (Completed)      Follow-up: No Follow-up on file.  Ann Held, DO

## 2016-09-13 NOTE — Patient Instructions (Signed)
Seborrheic Keratosis Seborrheic keratosis is a common, noncancerous (benign) skin growth. This condition causes waxy, rough, tan, brown, or black spots to appear on the skin. These skin growths can be flat or raised. CAUSES The cause of this condition is not known. RISK FACTORS This condition is more likely to develop in:  People who have a family history of seborrheic keratosis.  People who are 50 or older.  People who are pregnant.  People who have had estrogen replacement therapy. SYMPTOMS This condition often occurs on the face, chest, shoulders, back, or other areas. These growths:  Are usually painless, but may become irritated and itchy.  Can be yellow, brown, black, or other colors.  Are slightly raised or have a flat surface.  Are sometimes rough or wart-like in texture.  Are often waxy on the surface.  Are round or oval-shaped.  Sometimes look like they are "stuck on."  Often occur in groups, but may occur as a single growth. DIAGNOSIS This condition is diagnosed with a medical history and physical exam. A sample of the growth may be tested (skin biopsy). You may need to see a skin specialist (dermatologist). TREATMENT Treatment is not usually needed for this condition, unless the growths are irritated or are often bleeding. You may also choose to have the growths removed if you do not like their appearance. Most commonly, these growths are treated with a procedure in which liquid nitrogen is applied to "freeze" off the growth (cryosurgery). They may also be burned off with electricity or cut off. HOME CARE INSTRUCTIONS  Watch your growth for any changes.  Keep all follow-up visits as told by your health care provider. This is important.  Do not scratch or pick at the growth or growths. This can cause them to become irritated or infected. SEEK MEDICAL CARE IF:  You suddenly have many new growths.  Your growth bleeds, itches, or hurts.  Your growth suddenly  becomes larger or changes color.   This information is not intended to replace advice given to you by your health care provider. Make sure you discuss any questions you have with your health care provider.   Document Released: 01/12/2011 Document Revised: 08/31/2015 Document Reviewed: 04/27/2015 Elsevier Interactive Patient Education 2016 Elsevier Inc.  

## 2016-09-13 NOTE — Progress Notes (Signed)
Pre visit review using our clinic review tool, if applicable. No additional management support is needed unless otherwise documented below in the visit note. 

## 2016-09-14 ENCOUNTER — Other Ambulatory Visit: Payer: Self-pay | Admitting: Family Medicine

## 2016-09-14 ENCOUNTER — Telehealth: Payer: Self-pay | Admitting: Family Medicine

## 2016-09-14 LAB — CBC WITH DIFFERENTIAL/PLATELET
BASOS ABS: 0 10*3/uL (ref 0.0–0.1)
Basophils Relative: 0.3 % (ref 0.0–3.0)
EOS ABS: 1 10*3/uL — AB (ref 0.0–0.7)
Eosinophils Relative: 16.4 % — ABNORMAL HIGH (ref 0.0–5.0)
HEMATOCRIT: 32.3 % — AB (ref 39.0–52.0)
HEMOGLOBIN: 10.9 g/dL — AB (ref 13.0–17.0)
LYMPHS PCT: 24.2 % (ref 12.0–46.0)
Lymphs Abs: 1.5 10*3/uL (ref 0.7–4.0)
MCHC: 33.6 g/dL (ref 30.0–36.0)
MCV: 116 fl — ABNORMAL HIGH (ref 78.0–100.0)
MONO ABS: 0.4 10*3/uL (ref 0.1–1.0)
Monocytes Relative: 5.9 % (ref 3.0–12.0)
Neutro Abs: 3.2 10*3/uL (ref 1.4–7.7)
Neutrophils Relative %: 53.2 % (ref 43.0–77.0)
Platelets: 240 10*3/uL (ref 150.0–400.0)
RBC: 2.79 Mil/uL — AB (ref 4.22–5.81)
RDW: 15.9 % — AB (ref 11.5–15.5)
WBC: 6 10*3/uL (ref 4.0–10.5)

## 2016-09-14 LAB — IBC PANEL
IRON: 66 ug/dL (ref 42–165)
SATURATION RATIOS: 21.5 % (ref 20.0–50.0)
TRANSFERRIN: 219 mg/dL (ref 212.0–360.0)

## 2016-09-14 NOTE — Progress Notes (Signed)
lac

## 2016-09-14 NOTE — Telephone Encounter (Signed)
amlactin is similar and is over the counter.  They do not need an rx

## 2016-09-14 NOTE — Telephone Encounter (Signed)
ammonium lactate unable to be filled. Please advise    KP

## 2016-09-14 NOTE — Telephone Encounter (Signed)
Pt's daughter says that pharmacy is unable to fill Rx sent in yesterday. She would like to know if provider is going to send in something different to the pharmacy?   Please advise    Pamala [id number]

## 2016-09-16 DIAGNOSIS — R21 Rash and other nonspecific skin eruption: Secondary | ICD-10-CM | POA: Insufficient documentation

## 2016-09-16 HISTORY — DX: Rash and other nonspecific skin eruption: R21

## 2016-09-16 NOTE — Assessment & Plan Note (Signed)
Biopsy done con't cream , hydroxyzine Pt may need to go back to derm

## 2016-09-17 ENCOUNTER — Telehealth: Payer: Self-pay | Admitting: Family Medicine

## 2016-09-17 ENCOUNTER — Other Ambulatory Visit: Payer: Self-pay

## 2016-09-17 DIAGNOSIS — L299 Pruritus, unspecified: Secondary | ICD-10-CM

## 2016-09-17 DIAGNOSIS — R21 Rash and other nonspecific skin eruption: Secondary | ICD-10-CM

## 2016-09-17 NOTE — Telephone Encounter (Signed)
Upper abd on right

## 2016-09-17 NOTE — Telephone Encounter (Signed)
Please advise      KP 

## 2016-09-17 NOTE — Telephone Encounter (Signed)
Message left to call the office.    KP 

## 2016-09-17 NOTE — Telephone Encounter (Signed)
Caller name: Mitsy  Relation to pt: [facility] Call back number: [phone] Pharmacy:  Reason for call: Mitsy from [facility] from [facility] called wanting to be informed that pt had biopsy done on right side but they did not know what area exactuly and would like to be informed the exact area where it was done. Please advise.

## 2016-09-17 NOTE — Telephone Encounter (Signed)
Discussed with [name] and she verbalized understanding. We also reviewed his labs. See labs.    KP

## 2016-09-18 NOTE — Telephone Encounter (Signed)
The information has been given to Missy.   KP

## 2016-09-19 ENCOUNTER — Other Ambulatory Visit: Payer: Self-pay

## 2016-09-19 DIAGNOSIS — L853 Xerosis cutis: Secondary | ICD-10-CM

## 2016-09-19 MED ORDER — AMMONIUM LACTATE 12 % EX LOTN: 1.0000 "application " | TOPICAL_LOTION | Freq: Two times a day (BID) | CUTANEOUS | 1 refills | Status: DC | PRN

## 2016-09-19 NOTE — Progress Notes (Signed)
Lac-Hydrin did not come in 5% but came in 12%, per Dr.Lowne send to the pharmacy.     KP

## 2016-10-04 ENCOUNTER — Encounter: Payer: Self-pay | Admitting: Podiatry

## 2016-10-04 ENCOUNTER — Ambulatory Visit (INDEPENDENT_AMBULATORY_CARE_PROVIDER_SITE_OTHER): Payer: Medicare Other | Admitting: Podiatry

## 2016-10-04 VITALS — BP 138/53 | HR 75

## 2016-10-04 DIAGNOSIS — M79672 Pain in left foot: Secondary | ICD-10-CM | POA: Diagnosis not present

## 2016-10-04 DIAGNOSIS — M79671 Pain in right foot: Secondary | ICD-10-CM | POA: Diagnosis not present

## 2016-10-04 DIAGNOSIS — B351 Tinea unguium: Secondary | ICD-10-CM | POA: Diagnosis not present

## 2016-10-04 NOTE — Progress Notes (Signed)
SUBJECTIVE: 82 y.o. year old NIDDM male presents complaining of problematic toe nails. They are painful to walk.  Stated that he doesn't check his blood sugar anymore.   OBJECTIVE: DERMATOLOGIC EXAMINATION: Thick and white discolored brittle nails x 10, symptomatic.   VASCULAR EXAMINATION OF LOWER LIMBS: All pedal pulses are palpable with normal pulsation.  No edema or erythema noted.  NEUROLOGIC EXAMINATION OF THE LOWER LIMBS: All epicritic and tactile sensations grossly intact.   MUSCULOSKELETAL EXAMINATION: Rectus foot without gross deformities.  ASSESSMENT: Onychomycosis x 10. Pain with ambulation from thick nails.   PLAN: Debrided all nails. May return in 3 months or as needed

## 2016-10-04 NOTE — Patient Instructions (Signed)
Seen for hypertrophic nails. All nails debrided. Return in 3 months or as needed.  

## 2016-10-18 ENCOUNTER — Ambulatory Visit (INDEPENDENT_AMBULATORY_CARE_PROVIDER_SITE_OTHER): Payer: Medicare Other | Admitting: Family Medicine

## 2016-10-18 ENCOUNTER — Ambulatory Visit: Payer: TRICARE For Life (TFL) | Admitting: Family Medicine

## 2016-10-18 ENCOUNTER — Encounter: Payer: Self-pay | Admitting: Family Medicine

## 2016-10-18 VITALS — BP 116/40 | HR 70 | Temp 97.9°F | Ht 69.0 in | Wt 176.2 lb

## 2016-10-18 DIAGNOSIS — L282 Other prurigo: Secondary | ICD-10-CM | POA: Diagnosis not present

## 2016-10-18 MED ORDER — AMMONIUM LACTATE 12 % EX LOTN: 1.0000 "application " | TOPICAL_LOTION | Freq: Two times a day (BID) | CUTANEOUS | 1 refills | Status: DC

## 2016-10-18 MED ORDER — CETIRIZINE HCL 10 MG PO TABS: 5.0000 mg | ORAL_TABLET | Freq: Every day | ORAL | 1 refills | Status: DC

## 2016-10-18 NOTE — Progress Notes (Signed)
Chief Complaint  Patient presents with  . Follow-up    A1c/Pt reports facial rash with itching /Pt went to dermatologist but was not much help    Subjective: Patient is a 82 y.o. male here for skin check.  Went to derm, given a pill that was not helpful. 2 mo of itching rash that is spreading. He points to SK's on his sides. No sick contacts, recent travel, medication changes or new soaps/lotions/topicals/detergents. He does not remember what he was given for this, but according to his medication list, it included Lac Hydrin and hydroxyzine. No fevers, weight loss or abdominal pain.   ROS: Skin: +itchy skin  Family History  Problem Relation Age of Onset  . Depression Mother   . Alcohol abuse Father   . Alcohol abuse Brother    Past Medical History:  Diagnosis Date  . Diabetes mellitus   . GERD (gastroesophageal reflux disease)   . Heart murmur   . Hyperlipidemia   . Hypertension   . Personal history of colonic polyps   . Stroke ([place]) 03/2011   R lat thalamus, post in int capsule  . Thyroid disease    Allergies  Allergen Reactions  . Atenolol     REACTION: dyspnea  . Clonidine Hydrochloride     REACTION: dry mouth  . Prednisone     REACTION: Hair loss  . Verapamil     REACTION: hallucinations    Current Outpatient Prescriptions:  .  alfuzosin (UROXATRAL) 10 MG 24 hr tablet, TAKE 1 TABLET DAILY, Disp: 90 tablet, Rfl: 3 .  finasteride (PROSCAR) 5 MG tablet, TAKE 1 TABLET DAILY, Disp: 90 tablet, Rfl: 1 .  hydrOXYzine (ATARAX/VISTARIL) 50 MG tablet, Take 1 tablet (50 mg total) by mouth 3 (three) times daily as needed., Disp: 30 tablet, Rfl: 0 .  hyoscyamine (LEVSIN/SL) 0.125 MG SL tablet, Place 1 tablet (0.125 mg total) under the tongue every 4 (four) hours as needed., Disp: 30 tablet, Rfl: 0 .  levothyroxine (SYNTHROID, LEVOTHROID) 75 MCG tablet, TAKE 1 TABLET DAILY, Disp: 90 tablet, Rfl: 0 .  metFORMIN (GLUCOPHAGE) 1000 MG tablet, TAKE 1 TABLET TWICE A DAY WITH A MEAL,  Disp: 180 tablet, Rfl: 2 .  omeprazole (PRILOSEC) 40 MG capsule, Take 1 capsule (40 mg total) by mouth daily., Disp: 90 capsule, Rfl: 3 .  pravastatin (PRAVACHOL) 40 MG tablet, Take 1 tablet (40 mg total) by mouth daily. Repeat labs are due now, Disp: 90 tablet, Rfl: 0 .  ammonium lactate (LAC-HYDRIN) 12 % lotion, Apply 1 application topically 2 (two) times daily., Disp: 400 g, Rfl: 1 .  cetirizine (ZYRTEC) 10 MG tablet, Take 0.5 tablets (5 mg total) by mouth daily., Disp: 30 tablet, Rfl: 1 .  pravastatin (PRAVACHOL) 40 MG tablet, TAKE 1 TABLET DAILY (REPEAT LABS ARE DUE NOW) (Patient not taking: Reported on 10/18/2016), Disp: 90 tablet, Rfl: 0 .  telmisartan (MICARDIS) 80 MG tablet, Take 1 tablet (80 mg total) by mouth daily. (Patient not taking: Reported on 10/18/2016), Disp: 90 tablet, Rfl: 2 .  triamcinolone cream (KENALOG) 0.1 %, Apply 1 application topically 2 (two) times daily. (Patient not taking: Reported on 10/18/2016), Disp: 30 g, Rfl: 0  Objective: BP (!) 116/40 (BP Location: Right Arm, Patient Position: Sitting, Cuff Size: Small)   Pulse 70   Temp 97.9 F (36.6 C) (Oral)   Ht 5\' 9"  (1.753 m)   Wt 176 lb 3.2 oz (79.9 kg)   BMI 26.02 kg/m  General: Awake, appears stated age  HEENT: MMM, EOMi Heart: RRR, no murmurs Lungs: CTAB, no rales, wheezes or rhonchi. No accessory muscle use Abd: BS+, soft, NT, mild distension, no masses or organomegaly Skin: Multiple hyperpigmented and raised lesions on his sides and hip, they are also present on his back. There are hypopigmented macules and patches on his back that are confluent. There is no erythema, drainage, fluid filled lesions, or excoriations. Psych: Age appropriate judgment and insight, normal affect and mood  Assessment and Plan: Pruritic rash - Plan: ammonium lactate (LAC-HYDRIN) 12 % lotion, cetirizine (ZYRTEC) 10 MG tablet  Orders as above.  Stressed using the lotion twice daily. Also antihistamine, dose adjusted for his  advanced age, for symptom control. Could be pityriasis rosea or perhaps an atypica fungal infection. I am concerned with the multiple SK's which he says are new, but he is not having any GI or constitutional signs/symptoms (not exactly right spot for Leser Trelat?). Return in 4 days if no improvement, will biopsy at that point. May need second opinion with dermatology. The patient voiced understanding and agreement to the plan.  [place], DO 10/18/16  3:48 PM

## 2016-10-18 NOTE — Progress Notes (Signed)
Pre visit review using our clinic review tool, if applicable. No additional management support is needed unless otherwise documented below in the visit note. 

## 2016-10-22 ENCOUNTER — Ambulatory Visit: Payer: TRICARE For Life (TFL) | Admitting: Family Medicine

## 2016-10-22 ENCOUNTER — Ambulatory Visit (INDEPENDENT_AMBULATORY_CARE_PROVIDER_SITE_OTHER): Payer: Medicare Other | Admitting: Family Medicine

## 2016-10-22 ENCOUNTER — Encounter: Payer: Self-pay | Admitting: Family Medicine

## 2016-10-22 VITALS — BP 118/62 | HR 79 | Temp 98.4°F | Resp 16 | Ht 69.0 in | Wt 174.0 lb

## 2016-10-22 DIAGNOSIS — L853 Xerosis cutis: Secondary | ICD-10-CM | POA: Diagnosis not present

## 2016-10-22 NOTE — Progress Notes (Signed)
Pre visit review using our clinic review tool, if applicable. No additional management support is needed unless otherwise documented below in the visit note. 

## 2016-10-22 NOTE — Progress Notes (Signed)
Chief Complaint  Patient presents with  . Rash    Pt here for 4 day follow up.\   Reports doing much better since starting the Zyrtec 5 mg daily and the Lac Hydrin 12% BID. He has trouble reaching areas on his back, but his symptoms are otherwise controlled.  BP 118/62 (BP Location: Right Arm, Patient Position: Sitting, Cuff Size: Normal)   Pulse 79   Temp 98.4 F (36.9 C) (Oral)   Resp 16   Ht 5\' 9"  (1.753 m)   Wt 174 lb (78.9 kg)   SpO2 97% Comment: RA  BMI 25.70 kg/m  Skin: Scaly skin with multiple SK's on his back and torso. No erythema or drainage. Psych: Age appropriate judgment and insight.  Dry skin dermatitis  Continue Zyrtec and lotion. Try to get family member to apply lotion to back.  Fu prn. Pt voiced understanding and agreement to the plan.  [place], DO 10/22/16 4:46 PM

## 2016-11-28 ENCOUNTER — Other Ambulatory Visit: Payer: Self-pay | Admitting: Family Medicine

## 2016-11-28 NOTE — Telephone Encounter (Signed)
Refill sent per [facility] refill protocol/SLS  

## 2016-12-10 ENCOUNTER — Other Ambulatory Visit: Payer: Self-pay | Admitting: Family Medicine

## 2016-12-26 ENCOUNTER — Ambulatory Visit: Payer: TRICARE For Life (TFL) | Admitting: Medical

## 2016-12-27 ENCOUNTER — Ambulatory Visit (INDEPENDENT_AMBULATORY_CARE_PROVIDER_SITE_OTHER): Payer: Self-pay | Admitting: Medical

## 2016-12-27 ENCOUNTER — Encounter: Payer: Self-pay | Admitting: Medical

## 2016-12-27 ENCOUNTER — Telehealth: Payer: Self-pay | Admitting: Medical

## 2016-12-27 VITALS — BP 132/47 | HR 102 | Temp 99.6°F | Wt 171.8 lb

## 2016-12-27 DIAGNOSIS — R05 Cough: Secondary | ICD-10-CM

## 2016-12-27 NOTE — Telephone Encounter (Signed)
Pt left without being seen. He arrived late and I agreed to see him with understanding he would wait after I see other patients first rather than make other pt wait because he was late. He walked out without being seen. Would you make sure he does not receive a survey as under circumstances he was not seen and was upset as I made measures to accommodate him despite the fact he was late. So please make sure no survey sent and let me know you got message. Thanks.

## 2016-12-27 NOTE — Progress Notes (Signed)
   Subjective:    Patient ID: Douglas Grant, male    DOB: 01-30-1930, 83 y.o.   MRN: [id number]  HPI  Pt was late for his appointment. I agreed to see him but notified him that he would need to wait. I saw him leave room and he appeared frustrated with the wait. I advised him had one more pt to see before seeing him.  Review of Systems     Objective:   Physical Exam        Assessment & Plan:

## 2016-12-27 NOTE — Progress Notes (Signed)
Pre visit review using our clinic tool,if applicable. No additional management support is needed unless otherwise documented below in the visit note.  

## 2016-12-28 NOTE — Telephone Encounter (Signed)
I changed the status of the appointment to left without being seen. I'm not sure how to stop a survey but I suspect if we didn't complete the appointment nothing will go out. Routing to PA to see if that is so.

## 2016-12-30 ENCOUNTER — Telehealth: Payer: Self-pay | Admitting: Medical

## 2016-12-30 NOTE — Telephone Encounter (Signed)
Who can you call or I can call to verify what happens to survey when this happens. Under this particular circumstance not fair if survey sent out?

## 2017-01-03 ENCOUNTER — Encounter: Payer: Self-pay | Admitting: Podiatry

## 2017-01-03 ENCOUNTER — Ambulatory Visit (INDEPENDENT_AMBULATORY_CARE_PROVIDER_SITE_OTHER): Payer: Medicare Other | Admitting: Podiatry

## 2017-01-03 VITALS — BP 123/50 | HR 77

## 2017-01-03 DIAGNOSIS — M79671 Pain in right foot: Secondary | ICD-10-CM

## 2017-01-03 DIAGNOSIS — M79672 Pain in left foot: Secondary | ICD-10-CM

## 2017-01-03 DIAGNOSIS — B351 Tinea unguium: Secondary | ICD-10-CM

## 2017-01-03 NOTE — Patient Instructions (Signed)
Seen for hypertrophic nails. All nails debrided. Return in 3 months or as needed.  

## 2017-01-03 NOTE — Progress Notes (Signed)
SUBJECTIVE: 83 y.o. year old NIDDM male presents requesting toe nails trimmed. They are painful to walk.  Stated that he doesn't check his blood sugar anymore.   OBJECTIVE: DERMATOLOGIC EXAMINATION: Thick and white discolored brittle nails x 10, symptomatic.   VASCULAR EXAMINATION OF LOWER LIMBS: All pedal pulses are palpable with normal pulsation.  No edema or erythema noted.  NEUROLOGIC EXAMINATION OF THE LOWER LIMBS: All epicritic and tactile sensations grossly intact.   MUSCULOSKELETAL EXAMINATION: Rectus foot without gross deformities.  ASSESSMENT: Onychomycosis x 10. Pain with ambulation from thick nails.   PLAN: Debrided all nails. May return in 3 months or as needed

## 2017-02-07 ENCOUNTER — Other Ambulatory Visit: Payer: Self-pay | Admitting: *Deleted

## 2017-02-07 DIAGNOSIS — L282 Other prurigo: Secondary | ICD-10-CM

## 2017-02-07 MED ORDER — CETIRIZINE HCL 10 MG PO TABS: 5.0000 mg | ORAL_TABLET | Freq: Every day | ORAL | 1 refills | Status: DC

## 2017-02-07 NOTE — Telephone Encounter (Signed)
Rx sent to the pharmacy by e-script.//AB/CMA 

## 2017-02-08 ENCOUNTER — Other Ambulatory Visit: Payer: Self-pay | Admitting: Family Medicine

## 2017-02-19 ENCOUNTER — Other Ambulatory Visit: Payer: Self-pay | Admitting: Family Medicine

## 2017-02-19 MED ORDER — FINASTERIDE 5 MG PO TABS: 5.0000 mg | ORAL_TABLET | Freq: Every day | ORAL | 0 refills | Status: DC

## 2017-02-22 ENCOUNTER — Other Ambulatory Visit: Payer: Self-pay | Admitting: *Deleted

## 2017-02-22 DIAGNOSIS — L282 Other prurigo: Secondary | ICD-10-CM

## 2017-02-22 MED ORDER — CETIRIZINE HCL 10 MG PO TABS: 5.0000 mg | ORAL_TABLET | Freq: Every day | ORAL | 1 refills | Status: DC

## 2017-02-22 NOTE — Telephone Encounter (Signed)
Rx sent to the pharmacy by e-script.//AB/CMA 

## 2017-04-03 ENCOUNTER — Encounter: Payer: Self-pay | Admitting: Podiatry

## 2017-04-03 ENCOUNTER — Ambulatory Visit (INDEPENDENT_AMBULATORY_CARE_PROVIDER_SITE_OTHER): Payer: Medicare Other | Admitting: Podiatry

## 2017-04-03 DIAGNOSIS — M79671 Pain in right foot: Secondary | ICD-10-CM | POA: Diagnosis not present

## 2017-04-03 DIAGNOSIS — M79672 Pain in left foot: Secondary | ICD-10-CM | POA: Diagnosis not present

## 2017-04-03 DIAGNOSIS — B351 Tinea unguium: Secondary | ICD-10-CM

## 2017-04-03 NOTE — Progress Notes (Signed)
SUBJECTIVE: 83y.o. year old NIDDM male presents requesting toe nails trimmed. They are painful to walk.  Stated that he is not checking his blood sugar anymore.   OBJECTIVE: DERMATOLOGIC EXAMINATION: Thick and white discolored brittle nails x 10, symptomatic.   VASCULAR EXAMINATION OF LOWER LIMBS: All pedal pulses are palpable with normal pulsation.  No edema or erythema noted.  NEUROLOGIC EXAMINATION OF THE LOWER LIMBS: All epicritic and tactile sensations grossly intact.   MUSCULOSKELETAL EXAMINATION: Rectus foot without gross deformities.  ASSESSMENT: Onychomycosis x 10. Pain with ambulation from thick nails.   PLAN: Debrided all nails. May return in 3 months or as needed

## 2017-04-03 NOTE — Patient Instructions (Signed)
Seen for hypertrophic nails. All nails debrided. Return in 3 months or as needed.  

## 2017-05-13 ENCOUNTER — Other Ambulatory Visit: Payer: Self-pay | Admitting: Family Medicine

## 2017-05-22 ENCOUNTER — Other Ambulatory Visit: Payer: Self-pay | Admitting: Family Medicine

## 2017-05-22 MED ORDER — FINASTERIDE 5 MG PO TABS: 5.0000 mg | ORAL_TABLET | Freq: Every day | ORAL | 0 refills | Status: DC

## 2017-05-23 ENCOUNTER — Other Ambulatory Visit: Payer: Self-pay | Admitting: Family Medicine

## 2017-05-26 ENCOUNTER — Emergency Department (HOSPITAL_BASED_OUTPATIENT_CLINIC_OR_DEPARTMENT_OTHER)
Admission: EM | Admit: 2017-05-26 | Discharge: 2017-05-26 | Disposition: A | Discharge status: Home / Self Care | Payer: Medicare Other | Attending: Emergency Medicine | Admitting: Emergency Medicine

## 2017-05-26 ENCOUNTER — Encounter (HOSPITAL_BASED_OUTPATIENT_CLINIC_OR_DEPARTMENT_OTHER): Payer: Self-pay | Admitting: *Deleted

## 2017-05-26 DIAGNOSIS — Z87891 Personal history of nicotine dependence: Secondary | ICD-10-CM | POA: Insufficient documentation

## 2017-05-26 DIAGNOSIS — Y999 Unspecified external cause status: Secondary | ICD-10-CM | POA: Insufficient documentation

## 2017-05-26 DIAGNOSIS — S41111A Laceration without foreign body of right upper arm, initial encounter: Secondary | ICD-10-CM | POA: Diagnosis not present

## 2017-05-26 DIAGNOSIS — Z79899 Other long term (current) drug therapy: Secondary | ICD-10-CM | POA: Diagnosis not present

## 2017-05-26 DIAGNOSIS — S4991XA Unspecified injury of right shoulder and upper arm, initial encounter: Secondary | ICD-10-CM | POA: Diagnosis present

## 2017-05-26 DIAGNOSIS — Y939 Activity, unspecified: Secondary | ICD-10-CM | POA: Diagnosis not present

## 2017-05-26 DIAGNOSIS — Y929 Unspecified place or not applicable: Secondary | ICD-10-CM | POA: Insufficient documentation

## 2017-05-26 DIAGNOSIS — W269XXA Contact with unspecified sharp object(s), initial encounter: Secondary | ICD-10-CM | POA: Diagnosis not present

## 2017-05-26 DIAGNOSIS — E119 Type 2 diabetes mellitus without complications: Secondary | ICD-10-CM | POA: Diagnosis not present

## 2017-05-26 DIAGNOSIS — I10 Essential (primary) hypertension: Secondary | ICD-10-CM | POA: Diagnosis not present

## 2017-05-26 DIAGNOSIS — E039 Hypothyroidism, unspecified: Secondary | ICD-10-CM | POA: Diagnosis not present

## 2017-05-26 DIAGNOSIS — Z23 Encounter for immunization: Secondary | ICD-10-CM | POA: Diagnosis not present

## 2017-05-26 DIAGNOSIS — Z7902 Long term (current) use of antithrombotics/antiplatelets: Secondary | ICD-10-CM | POA: Diagnosis not present

## 2017-05-26 DIAGNOSIS — S51811A Laceration without foreign body of right forearm, initial encounter: Secondary | ICD-10-CM | POA: Diagnosis not present

## 2017-05-26 MED ORDER — TETANUS-DIPHTH-ACELL PERTUSSIS 5-2.5-18.5 LF-MCG/0.5 IM SUSP
0.5000 mL | Freq: Once | INTRAMUSCULAR | Status: AC
  Administered 2017-05-26: 0.5 mL via INTRAMUSCULAR
  Filled 2017-05-26: qty 0.5

## 2017-05-26 NOTE — Discharge Instructions (Signed)
You laceration was cleaned, repaired with glued and you received a tetanus shot today.   Please read attached instructions on wound care. Monitor for signs of infection : swelling, bleeding, pus, fever, worsening pain.   DERMABOND* Topical Skin Adhesive (2-octyl cyanoacrylate) is a sterile, liquid skin adhesive that holds wound edges together. The film will usually remain in place for 5 to 10 days, then naturally fall off your skin. The following will answer some of your questions and provide instructions for proper care for your wound while it is healing:  CHECK WOUND APPEARANCE  Some swelling, redness, and pain are common with all wounds and normally will go away as the wound heals. If swelling, redness, or pain increases or if the wound feels warm to the touch, contact a doctor. Also contact a doctor if the wound edges reopen or separate.  REPLACE BANDAGES  If your wound is bandaged, keep the bandage dry.  Replace the dressing daily until the adhesive film has fallen off or if the bandage should become wet, unless otherwise instructed by your physician.  When changing the dressing, do not place tape directly over the DERMABOND adhesive film, because removing the tape later may also remove the film.  AVOID TOPICAL MEDICATIONS  Do not apply liquid or ointment medications or any other product to your wound while the DERMABOND adhesive film is in place. These may loosen the film before your wound is healed.  KEEP WOUND DRY AND PROTECTED  You may occasionally and briefly wet your wound in the shower or bath. Do not soak or scrub your wound, do not swim, and avoid periods of heavy perspiration until the DERMABOND adhesive has naturally fallen off. After showering or bathing, gently blot your wound dry with a soft towel. If a protective dressing is being used, apply a fresh, dry bandage, being sure to keep the tape off the DERMABOND adhesive film.  Apply a clean, dry bandage over the wound if  necessary to protect it.  Protect your wound from injury until the skin has had sufficient time to heal.  Do not scratch, rub, or pick at the DERMABOND adhesive film. This may loosen the film before your wound is healed.  Protect the wound from prolonged exposure to sunlight or tanning lamps while the film is in place.

## 2017-05-26 NOTE — ED Provider Notes (Signed)
[facility] Provider Note   CSN: [id number] Arrival date & time: 05/26/17  1557  By signing my name below, I, Marcello Moores, attest that this documentation has been prepared under the direction and in the presence of Carmon Sails, PA-C. Electronically Signed: Marcello Moores, ED Scribe. 05/26/17. 5:30 PM.  History   Chief Complaint Chief Complaint  Patient presents with  . Extremity Laceration   The history is provided by the patient. No language interpreter was used.   HPI Comments: Douglas Grant is a 83 y.o. male who presents to the Emergency Department complaining of laceration on his right arm s/p an injury. He reports that his wife tried to put a band-aid his arm when her ring caused the laceration. Tetanus not UTD. The pt denies recent fall. He does not have associated chills, fever, sore throat, and bleeding/bruising easily. No other associated symptoms reported.   Past Medical History:  Diagnosis Date  . Diabetes mellitus   . GERD (gastroesophageal reflux disease)   . Heart murmur   . Hyperlipidemia   . Hypertension   . Personal history of colonic polyps   . Stroke ([place]) 03/2011   R lat thalamus, post in int capsule  . Thyroid disease     Patient Active Problem List   Diagnosis Date Noted  . Rash and nonspecific skin eruption 09/16/2016  . Onychomycosis 10/06/2015  . Pain in lower limb 10/06/2015  . Wart 01/18/2014  . Acute bronchitis 12/22/2013  . Wheezing 12/22/2013  . Obesity (BMI 30-39.9) 11/16/2013  . Paraspinal muscle spasm 08/06/2013  . Stroke ([place]) 04/24/2011  . B12 deficiency 04/24/2011  . BACK PAIN 07/28/2009  . Headache(784.0) 02/18/2009  . Candidiasis of other urogenital sites 07/13/2008  . Cervicalgia 06/15/2008  . PRURITUS 11/25/2007  . Other urinary incontinence 11/25/2007  . BENIGN PROSTATIC HYPERTROPHY, HX OF 11/25/2007  . HIP PAIN, LEFT 11/10/2007  . Hypothyroidism 08/21/2007  . OBESITY 08/21/2007  . ATHEROSCLEROSIS,  CORONARY, NATIVE ARTERY 08/21/2007  . VENTRICULAR HYPERTROPHY, LEFT 08/21/2007  . GERD 08/21/2007  . ERECTILE DYSFUNCTION, ORGANIC 08/21/2007  . COLONIC POLYPS, HX OF 08/21/2007  . SPONDYLOSIS, CERVICAL, WITH RADICULOPATHY 08/11/2007  . Carpal tunnel syndrome 07/28/2007  . SPRAIN/STRAIN, SHOULDER/ARM NEC 06/26/2007  . DIABETES MELLITUS, TYPE II 02/10/2007  . Hyperlipidemia LDL goal <100 02/10/2007  . Essential hypertension 02/10/2007    Past Surgical History:  Procedure Laterality Date  . APPENDECTOMY         Home Medications    Prior to Admission medications   Medication Sig Start Date End Date Taking? Authorizing Provider  alfuzosin (UROXATRAL) 10 MG 24 hr tablet TAKE 1 TABLET DAILY 05/23/17   Carollee Herter, Kendrick Fries R, DO  ammonium lactate (LAC-HYDRIN) 12 % lotion Apply 1 application topically 2 (two) times daily. 10/18/16   Shelda Pal, DO  cetirizine (ZYRTEC) 10 MG tablet Take 0.5 tablets (5 mg total) by mouth daily. 02/22/17   Shelda Pal, DO  finasteride (PROSCAR) 5 MG tablet Take 1 tablet (5 mg total) by mouth daily. 05/22/17   Ann Held, DO  hydrOXYzine (ATARAX/VISTARIL) 50 MG tablet Take 1 tablet (50 mg total) by mouth 3 (three) times daily as needed. 03/09/16   Ann Held, DO  hyoscyamine (LEVSIN/SL) 0.125 MG SL tablet Place 1 tablet (0.125 mg total) under the tongue every 4 (four) hours as needed. 06/12/16   Ann Held, DO  levothyroxine (SYNTHROID, LEVOTHROID) 75 MCG tablet TAKE 1 TABLET DAILY 05/13/17  Carollee Herter, [name] R, DO  metFORMIN (GLUCOPHAGE) 1000 MG tablet TAKE 1 TABLET TWICE A DAY WITH A MEAL 08/30/16   Carollee Herter, Alferd Apa, DO  omeprazole (PRILOSEC) 40 MG capsule Take 1 capsule (40 mg total) by mouth daily. 07/10/16   Roma Schanz R, DO  pravastatin (PRAVACHOL) 40 MG tablet TAKE 1 TABLET DAILY (REPEAT LABS ARE DUE NOW) 05/13/17   Carollee Herter, Alferd Apa, DO  telmisartan (MICARDIS) 80 MG tablet Take 1 tablet  (80 mg total) by mouth daily. 08/30/16   Roma Schanz R, DO  triamcinolone cream (KENALOG) 0.1 % Apply 1 application topically 2 (two) times daily. 03/09/16   Ann Held, DO    Family History Family History  Problem Relation Age of Onset  . Depression Mother   . Alcohol abuse Father   . Alcohol abuse Brother     Social History Social History  Substance Use Topics  . Smoking status: Former Smoker    Quit date: 04/18/1985  . Smokeless tobacco: Never Used  . Alcohol use No     Allergies   Atenolol; Clonidine hydrochloride; Prednisone; and Verapamil   Review of Systems Review of Systems  Constitutional: Negative for chills and fever.  Skin: Positive for wound.  Hematological: Does not bruise/bleed easily.     Physical Exam Updated Vital Signs BP (!) 136/56 (BP Location: Left Arm)   Pulse 86   Temp 98.5 F (36.9 C) (Oral)   Resp 16   Ht 5\' 6"  (1.676 m)   Wt 75.3 kg (166 lb)   SpO2 97%   BMI 26.79 kg/m   Physical Exam  Constitutional: He is oriented to person, place, and time. He appears well-developed and well-nourished.  HENT:  Head: Normocephalic and atraumatic.  Right Ear: External ear normal.  Left Ear: External ear normal.  Eyes: Conjunctivae and EOM are normal. Pupils are equal, round, and reactive to light.  Neck: Normal range of motion and phonation normal. Neck supple.  Cardiovascular: Normal rate, regular rhythm and normal heart sounds.   Pulmonary/Chest: Effort normal and breath sounds normal. He exhibits no bony tenderness.  Abdominal: Soft. There is no tenderness.  Musculoskeletal: Normal range of motion.  Neurological: He is alert and oriented to person, place, and time. No cranial nerve deficit or sensory deficit. He exhibits normal muscle tone. Coordination normal.  Skin: Skin is warm and dry. Laceration noted.  4.5 cm right arm laceration, straight edges.  Psychiatric: He has a normal mood and affect. His behavior is normal.  Judgment and thought content normal.  Nursing note and vitals reviewed.    ED Treatments / Results   DIAGNOSTIC STUDIES: Oxygen Saturation is 97% on RA, adequate by my interpretation.   COORDINATION OF CARE: 5:11 PM-Discussed next steps with pt. Pt verbalized understanding and is agreeable with the plan.   Labs (all labs ordered are listed, but only abnormal results are displayed) Labs Reviewed - No data to display  EKG  EKG Interpretation None       Radiology No results found.  Procedures .Marland KitchenLaceration Repair Date/Time: 05/27/2017 1:36 AM Performed by: Kinnie Feil Authorized by: Kinnie Feil   Consent:    Consent obtained:  Verbal   Consent given by:  Patient   Risks discussed:  Infection, pain and poor cosmetic result   Alternatives discussed:  No treatment Anesthesia (see MAR for exact dosages):    Anesthesia method:  None Laceration details:    Location:  Shoulder/arm  Shoulder/arm location:  R lower arm   Length (cm):  4.5 Repair type:    Repair type:  Simple Pre-procedure details:    Preparation:  Patient was prepped and draped in usual sterile fashion Exploration:    Hemostasis achieved with:  Direct pressure   Contaminated: no   Treatment:    Area cleansed with:  Betadine   Amount of cleaning:  Standard   Irrigation solution:  Sterile saline   Irrigation method:  Tap   Visualized foreign bodies/material removed: no   Skin repair:    Repair method:  Tissue adhesive (DERMABOND) Approximation:    Approximation:  Close   Vermilion border: well-aligned   Post-procedure details:    Dressing:  Open (no dressing)   (including critical care time)  Medications Ordered in ED Medications  Tdap (BOOSTRIX) injection 0.5 mL (0.5 mLs Intramuscular Given 05/26/17 1716)     Initial Impression / Assessment and Plan / ED Course  I have reviewed the triage vital signs and the nursing notes.  Pertinent labs & imaging results that were available  during my care of the patient were reviewed by me and considered in my medical decision making (see chart for details).     83 year old male presents to the ED with laceration to right forearm that he obtained when his wife's ring accidentally cut him. No blood thinners. No active bleeding. Laceration is very shallow and amenable to Dermabond repair. Patient's wife has dementia and is having a difficult time waiting for laceration repair in the emergency department. Laceration was amenable to Dermabond with good approximation.  ED return precautions given. Patient verbalized understanding and is agreeable with plan.  Final Clinical Impressions(s) / ED Diagnoses   Final diagnoses:  Laceration of right upper extremity, initial encounter    New Prescriptions Discharge Medication List as of 05/26/2017  5:26 PM     I personally performed the services described in this documentation, which was scribed in my presence. The recorded information has been reviewed and is accurate.      Kinnie Feil, PA-C 05/27/17 0139    Tegeler, Gwenyth Allegra, MD 05/27/17 [phone]

## 2017-05-26 NOTE — ED Notes (Signed)
ED Provider at bedside. 

## 2017-05-26 NOTE — ED Triage Notes (Signed)
2 inch laceration to right arm from is wifes wedding ring.  Bleeding controlled.

## 2017-06-18 ENCOUNTER — Other Ambulatory Visit: Payer: Self-pay | Admitting: Family Medicine

## 2017-06-27 ENCOUNTER — Other Ambulatory Visit: Payer: Self-pay | Admitting: Family Medicine

## 2017-06-27 DIAGNOSIS — I10 Essential (primary) hypertension: Secondary | ICD-10-CM

## 2017-07-03 ENCOUNTER — Ambulatory Visit (INDEPENDENT_AMBULATORY_CARE_PROVIDER_SITE_OTHER): Payer: Medicare Other | Admitting: Podiatry

## 2017-07-03 ENCOUNTER — Encounter: Payer: Medicare Other | Admitting: Podiatry

## 2017-07-03 ENCOUNTER — Encounter: Payer: Self-pay | Admitting: Podiatry

## 2017-07-03 DIAGNOSIS — B351 Tinea unguium: Secondary | ICD-10-CM

## 2017-07-03 DIAGNOSIS — M79672 Pain in left foot: Secondary | ICD-10-CM

## 2017-07-03 DIAGNOSIS — M79671 Pain in right foot: Secondary | ICD-10-CM | POA: Diagnosis not present

## 2017-07-03 NOTE — Progress Notes (Signed)
SUBJECTIVE: 83y.o. year old NIDDM male presents requesting toe nails trimmed. They are painful to walk.  Stated that he is not checking his blood sugar anymore.   OBJECTIVE: DERMATOLOGIC EXAMINATION: Thick and white discolored brittle nails x 10, symptomatic.   VASCULAR EXAMINATION OF LOWER LIMBS: All pedal pulses are palpable with normal pulsation.  No edema or erythema noted.  NEUROLOGIC EXAMINATION OF THE LOWER LIMBS: All epicritic and tactile sensations grossly intact.   MUSCULOSKELETAL EXAMINATION: Rectus foot without gross deformities.  ASSESSMENT: Onychomycosis x 10. Pain with ambulation from thick nails.   PLAN: Debrided all nails. May return in 3 months or as needed

## 2017-07-03 NOTE — Patient Instructions (Signed)
Seen for hypertrophic nails. All nails debrided. Return in 3 months or as needed.  

## 2017-07-26 ENCOUNTER — Telehealth: Payer: Self-pay | Admitting: Family Medicine

## 2017-07-26 DIAGNOSIS — I6523 Occlusion and stenosis of bilateral carotid arteries: Secondary | ICD-10-CM | POA: Diagnosis not present

## 2017-07-26 DIAGNOSIS — R55 Syncope and collapse: Secondary | ICD-10-CM | POA: Insufficient documentation

## 2017-07-26 DIAGNOSIS — R42 Dizziness and giddiness: Secondary | ICD-10-CM | POA: Diagnosis not present

## 2017-07-26 DIAGNOSIS — K219 Gastro-esophageal reflux disease without esophagitis: Secondary | ICD-10-CM | POA: Diagnosis not present

## 2017-07-26 DIAGNOSIS — E860 Dehydration: Secondary | ICD-10-CM | POA: Diagnosis not present

## 2017-07-26 DIAGNOSIS — E119 Type 2 diabetes mellitus without complications: Secondary | ICD-10-CM | POA: Diagnosis not present

## 2017-07-26 DIAGNOSIS — D649 Anemia, unspecified: Secondary | ICD-10-CM | POA: Diagnosis not present

## 2017-07-26 DIAGNOSIS — R197 Diarrhea, unspecified: Secondary | ICD-10-CM | POA: Insufficient documentation

## 2017-07-26 DIAGNOSIS — I10 Essential (primary) hypertension: Secondary | ICD-10-CM | POA: Diagnosis not present

## 2017-07-26 DIAGNOSIS — E785 Hyperlipidemia, unspecified: Secondary | ICD-10-CM | POA: Diagnosis not present

## 2017-07-26 DIAGNOSIS — K921 Melena: Secondary | ICD-10-CM | POA: Diagnosis not present

## 2017-07-26 DIAGNOSIS — E039 Hypothyroidism, unspecified: Secondary | ICD-10-CM | POA: Diagnosis not present

## 2017-07-26 HISTORY — DX: Syncope and collapse: R55

## 2017-07-26 HISTORY — DX: Diarrhea, unspecified: R19.7

## 2017-07-26 NOTE — Telephone Encounter (Signed)
[facility] Day - Client TELEPHONE ADVICE RECORD [facility] Patient Name: Douglas Grant DOB: 1930/10/23 Initial Comment Caller's dad was sitting on the the side of the bed and he fell back on the bed. His eyes rolled to the back of his head and a few seconds came to. He did it once again but not as severe. Dad feels funny and has diarrhea. Nurse Assessment Nurse: Dimas Chyle, RN, Dellis Filbert Date/Time Eilene Ghazi Time): 07/26/2017 11:04:42 AM Confirm and document reason for call. If symptomatic, describe symptoms. ---Caller's dad was sitting on the the side of the bed and he fell back on the bed. His eyes rolled to the back of his head and a few seconds came to. He did it once again but not as severe. Dad feels funny and has diarrhea. Does the patient have any new or worsening symptoms? ---Yes Will a triage be completed? ---Yes Related visit to physician within the last 2 weeks? ---No Does the PT have any chronic conditions? (i.e. diabetes, asthma, etc.) ---Yes List chronic conditions. ---HTN and DDM2 Is this a behavioral health or substance abuse call? ---No Guidelines Guideline Title Affirmed Question Affirmed Notes Fainting Fainted 2 times in one day Final Disposition User Go to ED Now Dimas Chyle, RN, [facility] Referrals [place] - ED Disagree/Comply: Comply

## 2017-07-26 NOTE — Telephone Encounter (Signed)
Pt's daughter called in to schedule an apt. PCP out of the office. Daughter states that pt had an episode. He blanked out and eyes rolled to the back of his head. I transferred call to [facility] for triaging.

## 2017-07-27 DIAGNOSIS — R55 Syncope and collapse: Secondary | ICD-10-CM | POA: Diagnosis not present

## 2017-07-27 DIAGNOSIS — I10 Essential (primary) hypertension: Secondary | ICD-10-CM | POA: Diagnosis present

## 2017-07-27 DIAGNOSIS — Z7984 Long term (current) use of oral hypoglycemic drugs: Secondary | ICD-10-CM | POA: Diagnosis not present

## 2017-07-27 DIAGNOSIS — D649 Anemia, unspecified: Secondary | ICD-10-CM | POA: Diagnosis present

## 2017-07-27 DIAGNOSIS — K219 Gastro-esophageal reflux disease without esophagitis: Secondary | ICD-10-CM | POA: Diagnosis present

## 2017-07-27 DIAGNOSIS — E039 Hypothyroidism, unspecified: Secondary | ICD-10-CM | POA: Diagnosis present

## 2017-07-27 DIAGNOSIS — R197 Diarrhea, unspecified: Secondary | ICD-10-CM | POA: Diagnosis present

## 2017-07-27 DIAGNOSIS — E785 Hyperlipidemia, unspecified: Secondary | ICD-10-CM | POA: Diagnosis present

## 2017-07-27 DIAGNOSIS — E119 Type 2 diabetes mellitus without complications: Secondary | ICD-10-CM | POA: Diagnosis present

## 2017-07-27 DIAGNOSIS — Z8673 Personal history of transient ischemic attack (TIA), and cerebral infarction without residual deficits: Secondary | ICD-10-CM | POA: Diagnosis not present

## 2017-07-27 DIAGNOSIS — Z87891 Personal history of nicotine dependence: Secondary | ICD-10-CM | POA: Diagnosis not present

## 2017-07-27 DIAGNOSIS — I6523 Occlusion and stenosis of bilateral carotid arteries: Secondary | ICD-10-CM | POA: Diagnosis present

## 2017-07-27 DIAGNOSIS — E860 Dehydration: Secondary | ICD-10-CM | POA: Diagnosis present

## 2017-07-27 DIAGNOSIS — N400 Enlarged prostate without lower urinary tract symptoms: Secondary | ICD-10-CM | POA: Diagnosis present

## 2017-07-29 ENCOUNTER — Telehealth: Payer: Self-pay | Admitting: Family Medicine

## 2017-07-29 NOTE — Telephone Encounter (Signed)
FYI for PCP.

## 2017-07-29 NOTE — Telephone Encounter (Signed)
Caller name: Paralee Cancel Relation to pt: PT from [facility]  Call back number: [phone]  Pharmacy:  Reason for call:  Patient declined physical therapy stating he has to much going on right now.

## 2017-08-01 ENCOUNTER — Ambulatory Visit (INDEPENDENT_AMBULATORY_CARE_PROVIDER_SITE_OTHER): Payer: Medicare Other | Admitting: Family Medicine

## 2017-08-01 ENCOUNTER — Telehealth: Payer: Self-pay | Admitting: Family Medicine

## 2017-08-01 ENCOUNTER — Other Ambulatory Visit: Payer: Self-pay | Admitting: Family Medicine

## 2017-08-01 ENCOUNTER — Encounter: Payer: Self-pay | Admitting: Family Medicine

## 2017-08-01 VITALS — BP 130/50 | HR 74 | Temp 98.9°F | Ht 69.0 in | Wt 167.8 lb

## 2017-08-01 DIAGNOSIS — R55 Syncope and collapse: Secondary | ICD-10-CM

## 2017-08-01 DIAGNOSIS — Z09 Encounter for follow-up examination after completed treatment for conditions other than malignant neoplasm: Secondary | ICD-10-CM

## 2017-08-01 DIAGNOSIS — D649 Anemia, unspecified: Secondary | ICD-10-CM | POA: Diagnosis not present

## 2017-08-01 DIAGNOSIS — E538 Deficiency of other specified B group vitamins: Secondary | ICD-10-CM

## 2017-08-01 DIAGNOSIS — E785 Hyperlipidemia, unspecified: Secondary | ICD-10-CM | POA: Diagnosis not present

## 2017-08-01 LAB — LIPID PANEL
Cholesterol: 113 mg/dL (ref 0–200)
HDL: 49.3 mg/dL (ref >39.00)
LDL CALC: 52 mg/dL (ref 0–99)
NONHDL: 64.16
Total CHOL/HDL Ratio: 2
Triglycerides: 63 mg/dL (ref 0.0–149.0)
VLDL: 12.6 mg/dL (ref 0.0–40.0)

## 2017-08-01 LAB — COMPREHENSIVE METABOLIC PANEL
ALT: 9 U/L (ref 0–53)
AST: 13 U/L (ref 0–37)
Albumin: 4 g/dL (ref 3.5–5.2)
Alkaline Phosphatase: 31 U/L — ABNORMAL LOW (ref 39–117)
BUN: 14 mg/dL (ref 6–23)
CHLORIDE: 104 meq/L (ref 96–112)
CO2: 31 mEq/L (ref 19–32)
CREATININE: 1.01 mg/dL (ref 0.40–1.50)
Calcium: 9 mg/dL (ref 8.4–10.5)
GFR: 74.63 mL/min (ref >60.00)
Glucose, Bld: 105 mg/dL — ABNORMAL HIGH (ref 70–99)
Potassium: 5 mEq/L (ref 3.5–5.1)
SODIUM: 138 meq/L (ref 135–145)
Total Bilirubin: 1 mg/dL (ref 0.2–1.2)
Total Protein: 6.4 g/dL (ref 6.0–8.3)

## 2017-08-01 LAB — CBC
HCT: 30.5 % — ABNORMAL LOW (ref 39.0–52.0)
Hemoglobin: 10.1 g/dL — ABNORMAL LOW (ref 13.0–17.0)
MCHC: 33 g/dL (ref 30.0–36.0)
Platelets: 237 10*3/uL (ref 150.0–400.0)
RBC: 2.58 Mil/uL — AB (ref 4.22–5.81)
RDW: 18.8 % — ABNORMAL HIGH (ref 11.5–15.5)
WBC: 5.4 10*3/uL (ref 4.0–10.5)

## 2017-08-01 LAB — FOLATE: FOLATE: 9.8 ng/mL (ref >5.9)

## 2017-08-01 LAB — VITAMIN B12: VITAMIN B 12: 75 pg/mL — AB (ref 211–911)

## 2017-08-01 NOTE — Patient Instructions (Addendum)
We have updated your medicine list.   Zyrtec, Lac Hydrin, Kenalog cream and Hyoscyamine were all temporary medicines and you no longer need to take them.   Micardis (telmisartan) is for your blood pressure. Do not take this until you follow up with Dr. Etter Sjogren. Your blood pressure is in an acceptable range right now off of the medicine.  Prilosec (omeprazole) is for your reflux. You can try to come off of it, but if your reflux symptoms return, return to it.   Stay well hydrated.   If you get dizzy again, look for something to grab ahold of like a railing or table. Sit down if able. Crouch down or take a knee if you are unable to do that. Let us know if you start feeling like that again.  Let us know if you need anything.

## 2017-08-01 NOTE — Telephone Encounter (Signed)
Pt's daughter [name] called in because she said that she is unable to make pt's apt with him but she would like to know if the assistant could call after pt's apt to update her with the details? She said that she feels that pt will forget to tell her everything.    [name]- [phone]

## 2017-08-01 NOTE — Progress Notes (Signed)
Chief Complaint  Patient presents with  . Hospitalization Follow-up    for Syncope-pt states he's feeling better since he has been drinking water-pt states his (R) lower arm is sore.    HPI Douglas Grant is a 83 y.o. y.o. male who presents for a transition of care visit.  Pt was discharged from [facility] on 07/27/17.  Within 48 business hours of discharge our office contacted him via telephone to coordinate his care and needs.   The patient had diarrhea and dehydration. He had not been drinking adequate amounts of liquids. Since being discharged, his diarrhea has resolved and he feels much better. He has been drinking much more water. His Micardis was held. There is some confusion with which medicines he should be taking. His daughter would like to be notified once the final plan has been formulated.  Social History   Social History  . Marital status: Married   Social History Main Topics  . Smoking status: Former Smoker    Quit date: 04/18/1985  . Smokeless tobacco: Never Used  . Alcohol use No  . Drug use: No   Past Medical History:  Diagnosis Date  . Diabetes mellitus   . GERD (gastroesophageal reflux disease)   . Heart murmur   . Hyperlipidemia   . Hypertension   . Personal history of colonic polyps   . Stroke ([place]) 03/2011   R lat thalamus, post in int capsule  . Thyroid disease    Family History  Problem Relation Age of Onset  . Depression Mother   . Alcohol abuse Father   . Alcohol abuse Brother    Allergies as of 08/01/2017      Reactions   Atenolol    REACTION: dyspnea   Clonidine Hydrochloride    REACTION: dry mouth   Prednisone    REACTION: Hair loss   Verapamil    REACTION: hallucinations      Medication List       Accurate as of 08/01/17  1:16 PM. Always use your most recent med list.          alfuzosin 10 MG 24 hr tablet Commonly known as:  UROXATRAL TAKE 1 TABLET DAILY   ferrous sulfate 325 (65 FE) MG tablet Take 1 tablet by mouth 2 (two) times  daily.   finasteride 5 MG tablet Commonly known as:  PROSCAR Take 1 tablet (5 mg total) by mouth daily.   levothyroxine 75 MCG tablet Commonly known as:  SYNTHROID, LEVOTHROID TAKE 1 TABLET DAILY   metFORMIN 1000 MG tablet Commonly known as:  GLUCOPHAGE TAKE 1 TABLET TWICE A DAY WITH A MEAL   omeprazole 40 MG capsule Commonly known as:  PRILOSEC TAKE 1 CAPSULE DAILY   pravastatin 40 MG tablet Commonly known as:  PRAVACHOL TAKE 1 TABLET DAILY (REPEAT LABS ARE DUE NOW)   telmisartan 80 MG tablet Commonly known as:  MICARDIS TAKE 1 TABLET DAILY       ROS:  Constitutional: No fevers or chills, no weight loss HEENT: No headaches, hearing loss, or runny nose, no sore throat Heart: No chest pain Lungs: No SOB, no cough Abd: No bowel changes, no pain, no N/V GU: No urinary complaints Neuro: No numbness, tingling or weakness Msk: No joint or muscle pain  Objective BP (!) 130/50 (BP Location: Left Arm, Patient Position: Sitting, Cuff Size: Normal)   Pulse 74   Temp 98.9 F (37.2 C) (Oral)   Ht 5\' 9"  (1.753 m)   Wt 167 lb  12.8 oz (76.1 kg)   SpO2 96%   BMI 24.78 kg/m  General Appearance:  awake, alert, oriented, in no acute distress and well developed, well nourished Skin:  there are no suspicious lesions or rashes of concern Head/face:  NCAT Eyes:  EOMI, PERRLA Ears:  canals and TMs NI Nose/Sinuses:  negative Mouth/Throat:  Mucosa moist, no lesions; pharynx without erythema, edema or exudate. Neck:  neck- supple, no mass, non-tender and no jvd Lungs: Clear to auscultation.  No rales, rhonchi, or wheezing. Normal effort, no accessory muscle use. Heart:  Heart sounds are normal.  Regular rate and rhythm.  Abdomen:  BS+, soft, NT, ND, no masses or organomegaly Musculoskeletal:  No muscle group atrophy or asymmetry, gait normal Neurologic:  Alert and oriented x 3, gait normal., reflexes normal and symmetric, strength and  sensation grossly normal Psych exam: Nml mood  and affect  Hospital discharge follow-up - Plan: CBC, Comprehensive metabolic panel  Hyperlipidemia LDL goal <100 - Plan: Lipid panel  Syncope, unspecified syncope type - Plan: ECHOCARDIOGRAM COMPLETE  Anemia, unspecified type  Normocytic anemia  B12 deficiency - Plan: Vitamin B12, Folate  Discharge summary and medication list have been reviewed/reconciled.  Labs pending at the time of discharge have been reviewed or are still pending at the time of this visit.  Follow-up labs and appointments have been ordered and/or coordinated appropriately. Educational materials regarding the patient's admitting diagnosis provided.  TRANSITIONAL CARE MANAGEMENT CERTIFICATION:  I certify the following are true:   1. Communication with the patient/care giver was made within 2 business days of discharge.  2. Complexity of Medical decision making is moderate.  3. Face to face visit occurred within 14 days of discharge.   Will order B12, Folate and Echo per rec of DC summary. The anemia was normocytic and there was no concern for AS. Cont to hold Micardis. Medicine list updated. F/u in 2-4 weeks with Dr. Etter Sjogren to see if he still needs to be on Micardis. The patient voiced understanding and agreement to the plan.  [place], DO 08/01/17 1:16 PM

## 2017-08-02 NOTE — Telephone Encounter (Signed)
Requesting call back

## 2017-08-05 NOTE — Telephone Encounter (Signed)
-----   Message from Shelda Pal, DO sent at 08/01/2017  7:19 PM EDT ----- B12 low, likely causing macrocytic anemia. Other labs unremarkable.   AB- let pt and daughter know that his B12 is low. I would like him to take oral Vit B12. This is available OTC. Take 1000 mcg daily for the next 2 weeks and we will recheck. Let's add "Intrinsic Factor" to this follow up lab. Please schedule labs and order B12 and Intrinsic factor. He does not need to be fasting. TY.

## 2017-08-05 NOTE — Telephone Encounter (Signed)
Called and LMOM on (08/02/17) @ ([phone]) informing the pt's daughter of the message below.  Asked her to give me a call back regarding scheduling the pt a lab appt.//AB/CMA

## 2017-08-07 NOTE — Telephone Encounter (Signed)
Called and spoke with the pt's daughter(Douglas Grant) to make sure she received the message below.  She stated that she received the message and that the pt has an appt with Dr. Nani Ravens on (08/22/17).  And she wanted to wait to have the repeated blood work done then.  She also asked if I could verify if the pt need to continue to hold the Micardis.  Informed her of the office note from the pt's last visit stating that the pt will need to hold the Micardis until he see his provider to see if he still needs to be on it.   She verbalized understanding and agreed.//AB/CMA

## 2017-08-09 ENCOUNTER — Ambulatory Visit (HOSPITAL_BASED_OUTPATIENT_CLINIC_OR_DEPARTMENT_OTHER)
Admission: RE | Admit: 2017-08-09 | Discharge: 2017-08-09 | Disposition: A | Discharge status: Home / Self Care | Payer: Medicare Other | Source: Ambulatory Visit | Attending: Family Medicine | Admitting: Family Medicine

## 2017-08-09 DIAGNOSIS — I119 Hypertensive heart disease without heart failure: Secondary | ICD-10-CM | POA: Insufficient documentation

## 2017-08-09 DIAGNOSIS — I081 Rheumatic disorders of both mitral and tricuspid valves: Secondary | ICD-10-CM | POA: Diagnosis not present

## 2017-08-09 DIAGNOSIS — Z8673 Personal history of transient ischemic attack (TIA), and cerebral infarction without residual deficits: Secondary | ICD-10-CM | POA: Diagnosis not present

## 2017-08-09 DIAGNOSIS — E119 Type 2 diabetes mellitus without complications: Secondary | ICD-10-CM | POA: Diagnosis not present

## 2017-08-09 DIAGNOSIS — E785 Hyperlipidemia, unspecified: Secondary | ICD-10-CM | POA: Insufficient documentation

## 2017-08-09 DIAGNOSIS — R55 Syncope and collapse: Secondary | ICD-10-CM | POA: Insufficient documentation

## 2017-08-09 LAB — ECHOCARDIOGRAM COMPLETE
AVLVOTPG: 5 mmHg
CHL CUP MV DEC (S): 264
CHL CUP RV SYS PRESS: 40 mmHg
CHL CUP TV REG PEAK VELOCITY: 281 cm/s
EERAT: 7.68
EWDT: 264 ms
FS: 37 % (ref 28–44)
IVS/LV PW RATIO, ED: 1.28
LA ID, A-P, ES: 45 mm
LA diam end sys: 45 mm
LADIAMINDEX: 2.33 cm/m2
LAVOL: 69 mL
LAVOLA4C: 65 mL
LAVOLIN: 35.7 mL/m2
LV E/e' medial: 7.68
LV PW d: 12 mm — AB (ref 0.6–1.1)
LV TDI E'LATERAL: 8.7
LV TDI E'MEDIAL: 7.51
LV e' LATERAL: 8.7 cm/s
LVEEAVG: 7.68
LVOT SV: 78 mL
LVOT VTI: 24.7 cm
LVOT area: 3.14 cm2
LVOT diameter: 20 mm
LVOTPV: 114 cm/s
Lateral S' vel: 15.3 cm/s
MV pk A vel: 79.8 m/s
MV pk E vel: 66.8 m/s
P 1/2 time: 491 ms
RV TAPSE: 23 mm
TR max vel: 281 cm/s

## 2017-08-09 NOTE — Progress Notes (Signed)
  Echocardiogram 2D Echocardiogram has been performed.  Douglas Grant [name] 08/09/2017, 3:27 PM

## 2017-08-13 ENCOUNTER — Other Ambulatory Visit: Payer: Self-pay | Admitting: Family Medicine

## 2017-08-13 NOTE — Telephone Encounter (Signed)
Sent rx to pharmacy. LB 

## 2017-08-20 ENCOUNTER — Telehealth: Payer: Self-pay | Admitting: Family Medicine

## 2017-08-20 ENCOUNTER — Other Ambulatory Visit: Payer: Self-pay | Admitting: Family Medicine

## 2017-08-20 DIAGNOSIS — IMO0002 Reserved for concepts with insufficient information to code with codable children: Secondary | ICD-10-CM

## 2017-08-20 DIAGNOSIS — E1151 Type 2 diabetes mellitus with diabetic peripheral angiopathy without gangrene: Secondary | ICD-10-CM

## 2017-08-20 DIAGNOSIS — E1165 Type 2 diabetes mellitus with hyperglycemia: Principal | ICD-10-CM

## 2017-08-20 NOTE — Telephone Encounter (Signed)
Metformin denied to [facility]/needs OV with PCP/thx dmf

## 2017-08-22 ENCOUNTER — Ambulatory Visit (INDEPENDENT_AMBULATORY_CARE_PROVIDER_SITE_OTHER): Payer: Medicare Other | Admitting: Family Medicine

## 2017-08-22 ENCOUNTER — Encounter: Payer: Self-pay | Admitting: Family Medicine

## 2017-08-22 VITALS — BP 130/72 | HR 76 | Temp 98.5°F | Ht 69.5 in | Wt 163.1 lb

## 2017-08-22 DIAGNOSIS — E538 Deficiency of other specified B group vitamins: Secondary | ICD-10-CM | POA: Diagnosis not present

## 2017-08-22 DIAGNOSIS — I10 Essential (primary) hypertension: Secondary | ICD-10-CM

## 2017-08-22 MED ORDER — VITAMIN B-12 1000 MCG PO TABS: 1000.0000 ug | ORAL_TABLET | Freq: Every day | ORAL | 1 refills | Status: DC

## 2017-08-22 NOTE — Progress Notes (Signed)
Chief Complaint  Patient presents with  . Follow-up    Subjective Douglas Grant is a 83 y.o. male who presents for hypertension follow up. He does not monitor home blood pressures. He was recently stopped on Micardis and has been feeling well. He is here to make sure his BP did not go up while off of it.  He is adhering to a healthy diet overall. Current exercise: none  B12 def, has not taken supplement. Will hold off on checking labs today in light of this.   Past Medical History:  Diagnosis Date  . Diabetes mellitus   . GERD (gastroesophageal reflux disease)   . Heart murmur   . Hyperlipidemia   . Hypertension   . Personal history of colonic polyps   . Stroke ([place]) 03/2011   R lat thalamus, post in int capsule  . Thyroid disease    Family History  Problem Relation Age of Onset  . Depression Mother   . Alcohol abuse Father   . Alcohol abuse Brother      Medications Current Outpatient Prescriptions on File Prior to Visit  Medication Sig Dispense Refill  . alfuzosin (UROXATRAL) 10 MG 24 hr tablet TAKE 1 TABLET DAILY 90 tablet 1  . ferrous sulfate 325 (65 FE) MG tablet Take 1 tablet by mouth 2 (two) times daily.    . finasteride (PROSCAR) 5 MG tablet Take 1 tablet (5 mg total) by mouth daily. 90 tablet 0  . levothyroxine (SYNTHROID, LEVOTHROID) 75 MCG tablet TAKE 1 TABLET DAILY 90 tablet 0  . metFORMIN (GLUCOPHAGE) 1000 MG tablet TAKE 1 TABLET TWICE A DAY WITH A MEAL 180 tablet 2  . omeprazole (PRILOSEC) 40 MG capsule TAKE 1 CAPSULE DAILY 90 capsule 3  . pravastatin (PRAVACHOL) 40 MG tablet TAKE 1 TABLET DAILY (REPEAT LABS ARE DUE NOW) 90 tablet 0   Allergies Allergies  Allergen Reactions  . Atenolol     REACTION: dyspnea  . Clonidine Hydrochloride     REACTION: dry mouth  . Prednisone     REACTION: Hair loss  . Verapamil     REACTION: hallucinations    Review of Systems Cardiovascular: no chest pain Respiratory:  no shortness of breath  Exam BP 130/72 (BP  Location: Left Arm, Patient Position: Sitting, Cuff Size: Normal)   Pulse 76   Temp 98.5 F (36.9 C) (Oral)   Ht 5' 9.5" (1.765 m)   Wt 163 lb 2 oz (74 kg)   SpO2 95%   BMI 23.74 kg/m  General:  well developed, well nourished, in no apparent distress Skin:  warm, no pallor or diaphoresis Eyes:  pupils equal and round, sclera anicteric without injection Heart :RRR, 2/6 SEM heard loudest at aortic listening post with radiation to carotids, no LE edema Lungs:  clear to auscultation, no accessory muscle use Psych: well oriented with normal range of affect and appropriate judgment/insight  Essential hypertension  Vitamin B12 deficiency - Plan: B12, Intrinsic Factor Antibodies, vitamin B-12 (CYANOCOBALAMIN) 1000 MCG tablet  Orders as above. OK to stay off of Micardis.  Counseled on diet and exercise F/u in 1 mo w Dr. Etter Sjogren to discuss DM and if he needs to be on Metformin, will check above labs at that time also. The patient voiced understanding and agreement to the plan.  [place], DO 08/22/17  11:13 AM

## 2017-08-22 NOTE — Patient Instructions (Addendum)
Stop taking Micardis.   Start taking Vit B12.  We will recheck in 1 mo.

## 2017-08-22 NOTE — Progress Notes (Signed)
Pre visit review using our clinic review tool, if applicable. No additional management support is needed unless otherwise documented below in the visit note. 

## 2017-08-23 ENCOUNTER — Telehealth: Payer: Self-pay | Admitting: Family Medicine

## 2017-08-23 NOTE — Telephone Encounter (Signed)
Daughter called in today and was upset that labs were not done yesterday.  She states he has been taking vitamin B12 (has been taking 3 weeks this coming Monday) and thought it would have been checked.  Also--she needs clarification on the metformin--is he to stop?? or continue. If so either way why?  She also wants to know why to stop micardis?  She did not think you could just stop blood pressure medication??

## 2017-08-23 NOTE — Telephone Encounter (Signed)
Patients daughter informed of response from Dr. Nani Ravens. She did verbalized understanding of all information.

## 2017-08-23 NOTE — Telephone Encounter (Signed)
Pt's daughter [name] - [phone] called in because she would like an update from pt's visit yesterday.

## 2017-08-23 NOTE — Telephone Encounter (Signed)
Apologies. I asked the patient if he was taking Vit B12 and was told no so I wanted to hold off on checking B12 again until he had been taking it. Keep taking Metformin. His next appt with Dr. Etter Sjogren is for diabetes and they can decide together if he needs to continue to take it. We stopped the Micardis because his blood pressure is fine without it. If it was elevated after being off of it, it would have been restarted. You can stop any blood pressure medication if it isn't needed like in Don's case. Ty.

## 2017-09-10 NOTE — Addendum Note (Signed)
Addended by: Benson Setting L on: 09/10/2017 01:07 PM   Modules accepted: Orders

## 2017-09-10 NOTE — Telephone Encounter (Signed)
Pt's daughter Randye Lobo) called stating pt has only one wk of meds left and is needing his meds asap, pt already had an appt with Dr Nani Ravens and already has an appt with his provider on Sep 23, 2017. So pt is needing is meds sent asap [facility]. If any question please call daughter tel [phone]. Please advise.

## 2017-09-10 NOTE — Telephone Encounter (Signed)
Ok to send 90 day

## 2017-09-12 MED ORDER — METFORMIN HCL 1000 MG PO TABS: ORAL_TABLET | ORAL | 0 refills | Status: DC

## 2017-09-12 NOTE — Addendum Note (Signed)
Addended by: Benson Setting L on: 09/12/2017 08:55 AM   Modules accepted: Orders

## 2017-09-12 NOTE — Telephone Encounter (Signed)
Refills sent

## 2017-09-13 NOTE — Telephone Encounter (Signed)
Spoke with daughter and made her aware she had no additional questions at this time. Nothing further is needed

## 2017-09-20 NOTE — Progress Notes (Deleted)
Subjective:   Douglas Grant is a 83 y.o. male who presents for Medicare Annual/Subsequent preventive examination.  Review of Systems:  No ROS.  Medicare Wellness Visit. Additional risk factors are reflected in the social history.    Sleep patterns:  Home Safety/Smoke Alarms: Feels safe in home. Smoke alarms in place.  Living environment; residence and Firearm Safety:  [place] Safety/Bike Helmet: Wears seat belt.  Male:   CCS- No longer doing routine screening due to age.      PSA-  Lab Results  Component Value Date   PSA 0.16 06/12/2016   PSA 0.25 03/02/2014   PSA 0.58 11/25/2007       Objective:    Vitals: There were no vitals taken for this visit.  There is no height or weight on file to calculate BMI.  Tobacco History  Smoking Status  . Former Smoker  . Quit date: 04/18/1985  Smokeless Tobacco  . Never Used     Counseling given: Not Answered   Past Medical History:  Diagnosis Date  . Diabetes mellitus   . GERD (gastroesophageal reflux disease)   . Heart murmur   . Hyperlipidemia   . Hypertension   . Personal history of colonic polyps   . Stroke ([place]) 03/2011   R lat thalamus, post in int capsule  . Thyroid disease    Past Surgical History:  Procedure Laterality Date  . APPENDECTOMY     Family History  Problem Relation Age of Onset  . Depression Mother   . Alcohol abuse Father   . Alcohol abuse Brother    History  Sexual Activity  . Sexual activity: Not on file    Outpatient Encounter Prescriptions as of 09/23/2017  Medication Sig  . alfuzosin (UROXATRAL) 10 MG 24 hr tablet TAKE 1 TABLET DAILY  . ferrous sulfate 325 (65 FE) MG tablet Take 1 tablet by mouth 2 (two) times daily.  . finasteride (PROSCAR) 5 MG tablet Take 1 tablet (5 mg total) by mouth daily.  Marland Kitchen levothyroxine (SYNTHROID, LEVOTHROID) 75 MCG tablet TAKE 1 TABLET DAILY  . metFORMIN (GLUCOPHAGE) 1000 MG tablet TAKE 1 TABLET TWICE A DAY WITH A MEAL  . omeprazole (PRILOSEC) 40 MG  capsule TAKE 1 CAPSULE DAILY  . pravastatin (PRAVACHOL) 40 MG tablet TAKE 1 TABLET DAILY (REPEAT LABS ARE DUE NOW)  . vitamin B-12 (CYANOCOBALAMIN) 1000 MCG tablet Take 1 tablet (1,000 mcg total) by mouth daily.   No facility-administered encounter medications on file as of 09/23/2017.     Activities of Daily Living No flowsheet data found.  Patient Care Team: Carollee Herter, Alferd Apa, DO as PCP - General Marygrace Drought, MD as Consulting Physician (Ophthalmology)   Assessment:    Physical assessment deferred to PCP.  Exercise Activities and Dietary recommendations   Diet (meal preparation, eat out, water intake, caffeinated beverages, dairy products, fruits and vegetables): {Desc; diets:16563} Breakfast: Lunch:  Dinner:      Goals    None     Fall Risk Fall Risk  09/13/2016 06/12/2016 11/16/2014 11/16/2013  Falls in the past year? No No No No   Depression Screen PHQ 2/9 Scores 09/13/2016 06/12/2016 11/16/2014 11/16/2013  PHQ - 2 Score 0 0 0 0    Cognitive Function MMSE - Mini Mental State Exam 06/12/2016  Orientation to time 5  Orientation to Place 5  Registration 3  Attention/ Calculation 5  Recall 3  Language- name 2 objects 2  Language- repeat 1  Language- follow 3  step command 3  Language- read & follow direction 1  Write a sentence 1  Copy design 1  Total score 30        Immunization History  Administered Date(s) Administered  . Influenza Split 11/27/2011, 10/21/2012  . Influenza Whole 10/09/2005, 11/10/2007, 09/20/2008, 10/10/2009, 08/21/2010  . Influenza, High Dose Seasonal PF 10/08/2013, 10/03/2015, 08/27/2016  . Influenza,inj,Quad PF,6+ Mos 10/12/2014  . Pneumococcal Conjugate-13 11/16/2014  . Pneumococcal Polysaccharide-23 05/21/2006  . Td 06/04/2005, 06/27/2010  . Tdap 05/26/2017   Screening Tests Health Maintenance  Topic Date Due  . URINE MICROALBUMIN  10/02/2016  . COLONOSCOPY  10/18/2016  . OPHTHALMOLOGY EXAM  11/14/2016  . HEMOGLOBIN A1C   12/12/2016  . FOOT EXAM  06/12/2017  . INFLUENZA VACCINE  07/24/2017  . TETANUS/TDAP  05/27/2027  . PNA vac Low Risk Adult  Completed      Plan:   ***  I have personally reviewed and noted the following in the patient's chart:   . Medical and social history . Use of alcohol, tobacco or illicit drugs  . Current medications and supplements . Functional ability and status . Nutritional status . Physical activity . Advanced directives . List of other physicians . Hospitalizations, surgeries, and ER visits in previous 12 months . Vitals . Screenings to include cognitive, depression, and falls . Referrals and appointments  In addition, I have reviewed and discussed with patient certain preventive protocols, quality metrics, and best practice recommendations. A written personalized care plan for preventive services as well as general preventive health recommendations were provided to patient.     Naaman Plummer [place], [place]  09/20/2017

## 2017-09-23 ENCOUNTER — Ambulatory Visit: Payer: Medicare Other | Admitting: Family Medicine

## 2017-09-23 DIAGNOSIS — Z0289 Encounter for other administrative examinations: Secondary | ICD-10-CM

## 2017-10-03 ENCOUNTER — Ambulatory Visit: Payer: Medicare Other | Admitting: Podiatry

## 2017-10-03 ENCOUNTER — Ambulatory Visit: Payer: Medicare Other | Admitting: Family Medicine

## 2017-10-17 ENCOUNTER — Ambulatory Visit (INDEPENDENT_AMBULATORY_CARE_PROVIDER_SITE_OTHER): Payer: Medicare Other | Admitting: Podiatry

## 2017-10-17 ENCOUNTER — Encounter: Payer: Self-pay | Admitting: Podiatry

## 2017-10-17 DIAGNOSIS — M79672 Pain in left foot: Secondary | ICD-10-CM

## 2017-10-17 DIAGNOSIS — B351 Tinea unguium: Secondary | ICD-10-CM

## 2017-10-17 DIAGNOSIS — M79671 Pain in right foot: Secondary | ICD-10-CM | POA: Diagnosis not present

## 2017-10-17 NOTE — Patient Instructions (Signed)
Seen for hypertrophic nails. All nails debrided. Return in 3 months or as needed.  

## 2017-10-17 NOTE — Progress Notes (Signed)
Subjective: 83 y.o. year old male patient presents complaining of painful nails. Patient requests toe nails trimmed. Left big toe nail is splitting with discoloration.  Objective: Dermatologic: Thick yellow deformed nails x 10.  Vascular: Pedal pulses are not palpable DP and PT bilateral. Orthopedic: Contracted lesser digits bilateral. Neurologic: All epicritic and tactile sensations grossly intact.  Assessment: Dystrophic mycotic nails x 10. Painful nails.  Treatment: All mycotic nails debrided.  Return in 3 months or as needed.

## 2017-10-23 NOTE — Progress Notes (Signed)
Subjective:   Douglas Grant is a 83 y.o. male who presents for Medicare Annual/Subsequent preventive examination. Pt accompanied by his wife who is in a wheelchair. Pt states he can't leave her. She slept for most of the interview. Pt states that their children stop by everyday to check on them. He describes his health as fair.   Review of Systems:  No ROS.  Medicare Wellness Visit. Additional risk factors are reflected in the social history.  Cardiac Risk Factors include: advanced age (>49men, >22 women);diabetes mellitus;dyslipidemia;hypertension;male gender Sleep patterns: pt states he sleeps well    Male:   CCS-  Pt reported last 2012   PSA-  Lab Results  Component Value Date   PSA 0.16 06/12/2016   PSA 0.25 03/02/2014   PSA 0.58 11/25/2007       Objective:    Vitals: BP (!) 142/64 (BP Location: Left Arm, Patient Position: Sitting, Cuff Size: Normal)   Pulse 79   Wt 168 lb 6.4 oz (76.4 kg)   SpO2 97%   BMI 24.51 kg/m   Body mass index is 24.51 kg/m.  Tobacco Social History   Tobacco Use  Smoking Status Former Smoker  . Last attempt to quit: 04/18/1985  . Years since quitting: 32.5  Smokeless Tobacco Never Used     Counseling given: Not Answered   Past Medical History:  Diagnosis Date  . Diabetes mellitus   . GERD (gastroesophageal reflux disease)   . Heart murmur   . Hyperlipidemia   . Hypertension   . Personal history of colonic polyps   . Stroke ([place]) 03/2011   R lat thalamus, post in int capsule  . Thyroid disease    Past Surgical History:  Procedure Laterality Date  . APPENDECTOMY     Family History  Problem Relation Age of Onset  . Depression Mother   . Alcohol abuse Father   . Alcohol abuse Brother    Social History   Substance and Sexual Activity  Sexual Activity Not on file    Outpatient Encounter Medications as of 10/31/2017  Medication Sig  . alfuzosin (UROXATRAL) 10 MG 24 hr tablet TAKE 1 TABLET DAILY  . ferrous sulfate  325 (65 FE) MG tablet Take 1 tablet by mouth 2 (two) times daily.  . finasteride (PROSCAR) 5 MG tablet Take 1 tablet (5 mg total) by mouth daily.  Marland Kitchen levothyroxine (SYNTHROID, LEVOTHROID) 75 MCG tablet TAKE 1 TABLET DAILY  . metFORMIN (GLUCOPHAGE) 1000 MG tablet TAKE 1 TABLET TWICE A DAY WITH A MEAL  . omeprazole (PRILOSEC) 40 MG capsule TAKE 1 CAPSULE DAILY  . pravastatin (PRAVACHOL) 40 MG tablet TAKE 1 TABLET DAILY (REPEAT LABS ARE DUE NOW)  . vitamin B-12 (CYANOCOBALAMIN) 1000 MCG tablet Take 1 tablet (1,000 mcg total) by mouth daily.  . [DISCONTINUED] metFORMIN (GLUCOPHAGE) 1000 MG tablet TAKE 1 TABLET TWICE A DAY WITH A MEAL   No facility-administered encounter medications on file as of 10/31/2017.     Activities of Daily Living In your present state of health, do you have any difficulty performing the following activities: 10/31/2017  Hearing? Y  Vision? (No Data)  Comment wears reading glasses.  hx cataract sx  Difficulty concentrating or making decisions? Y  Walking or climbing stairs? N  Dressing or bathing? N  Doing errands, shopping? N  Preparing Food and eating ? N  Using the Toilet? N  In the past six months, have you accidently leaked urine? Y  Do you have problems with  loss of bowel control? N  Managing your Medications? N  Managing your Finances? N  Housekeeping or managing your Housekeeping? N  Some recent data might be hidden    Patient Care Team: Carollee Herter, Alferd Apa, DO as PCP - General Marygrace Drought, MD as Consulting Physician (Ophthalmology)   Assessment:    Physical assessment deferred to PCP.  Exercise Activities and Dietary recommendations Current Exercise Habits: The patient does not participate in regular exercise at present Diet (meal preparation, eat out, water intake, caffeinated beverages, dairy products, fruits and vegetables): Pt states he still cooks and that his children brings them food.     Goal: Maintain current health.  Fall  Risk Fall Risk  10/31/2017 09/13/2016 06/12/2016 11/16/2014 11/16/2013  Falls in the past year? No No No No No   Depression Screen PHQ 2/9 Scores 10/31/2017 09/13/2016 06/12/2016 11/16/2014  PHQ - 2 Score 0 0 0 0    Cognitive Function MMSE - Mini Mental State Exam 10/31/2017 06/12/2016  Orientation to time 5 5  Orientation to Place 3 5  Registration 3 3  Attention/ Calculation 4 5  Recall 0 3  Language- name 2 objects 2 2  Language- repeat 1 1  Language- follow 3 step command 3 3  Language- read & follow direction 1 1  Write a sentence 1 1  Copy design 1 1  Total score 24 30        Immunization History  Administered Date(s) Administered  . Influenza Split 11/27/2011, 10/21/2012  . Influenza Whole 10/09/2005, 11/10/2007, 09/20/2008, 10/10/2009, 08/21/2010  . Influenza, High Dose Seasonal PF 10/08/2013, 10/03/2015, 08/27/2016, 10/31/2017  . Influenza,inj,Quad PF,6+ Mos 10/12/2014  . Pneumococcal Conjugate-13 11/16/2014  . Pneumococcal Polysaccharide-23 05/21/2006  . Td 06/04/2005, 06/27/2010  . Tdap 05/26/2017   Screening Tests Health Maintenance  Topic Date Due  . URINE MICROALBUMIN  10/02/2016  . COLONOSCOPY  10/18/2016  . OPHTHALMOLOGY EXAM  11/14/2016  . HEMOGLOBIN A1C  12/12/2016  . FOOT EXAM  06/12/2017  . TETANUS/TDAP  05/27/2027  . INFLUENZA VACCINE  Completed  . PNA vac Low Risk Adult  Completed      Plan:  Follow up with PCP today as scheduled.  Continue to eat heart healthy diet (full of fruits, vegetables, whole grains, lean protein, water--limit salt, fat, and sugar intake) and increase physical activity as tolerated.  Continue doing brain stimulating activities (puzzles, reading, adult coloring books, staying active) to keep memory sharp.     I have personally reviewed and noted the following in the patient's chart:   . Medical and social history . Use of alcohol, tobacco or illicit drugs  . Current medications and supplements . Functional ability  and status . Nutritional status . Physical activity . Advanced directives . List of other physicians . Hospitalizations, surgeries, and ER visits in previous 12 months . Vitals . Screenings to include cognitive, depression, and falls . Referrals and appointments  In addition, I have reviewed and discussed with patient certain preventive protocols, quality metrics, and best practice recommendations. A written personalized care plan for preventive services as well as general preventive health recommendations were provided to patient.     Shela Nevin, [place]  10/31/2017  Noted. Agree with above.  [place], DO 10/31/17 2:38 PM

## 2017-10-30 ENCOUNTER — Other Ambulatory Visit: Payer: Self-pay

## 2017-10-30 DIAGNOSIS — E1151 Type 2 diabetes mellitus with diabetic peripheral angiopathy without gangrene: Secondary | ICD-10-CM

## 2017-10-30 DIAGNOSIS — E1165 Type 2 diabetes mellitus with hyperglycemia: Principal | ICD-10-CM

## 2017-10-30 DIAGNOSIS — IMO0002 Reserved for concepts with insufficient information to code with codable children: Secondary | ICD-10-CM

## 2017-10-30 MED ORDER — METFORMIN HCL 1000 MG PO TABS: ORAL_TABLET | ORAL | 1 refills | Status: DC

## 2017-10-31 ENCOUNTER — Telehealth: Payer: Self-pay | Admitting: Family Medicine

## 2017-10-31 ENCOUNTER — Ambulatory Visit (INDEPENDENT_AMBULATORY_CARE_PROVIDER_SITE_OTHER): Payer: Medicare Other | Admitting: Family Medicine

## 2017-10-31 ENCOUNTER — Encounter: Payer: Self-pay | Admitting: Family Medicine

## 2017-10-31 VITALS — BP 142/64 | HR 79 | Wt 168.4 lb

## 2017-10-31 DIAGNOSIS — Z0000 Encounter for general adult medical examination without abnormal findings: Secondary | ICD-10-CM | POA: Diagnosis not present

## 2017-10-31 DIAGNOSIS — E119 Type 2 diabetes mellitus without complications: Secondary | ICD-10-CM | POA: Diagnosis not present

## 2017-10-31 DIAGNOSIS — R002 Palpitations: Secondary | ICD-10-CM | POA: Diagnosis not present

## 2017-10-31 DIAGNOSIS — Z23 Encounter for immunization: Secondary | ICD-10-CM

## 2017-10-31 LAB — CBC
HEMATOCRIT: 37.1 % — AB (ref 39.0–52.0)
HEMOGLOBIN: 12.2 g/dL — AB (ref 13.0–17.0)
MCHC: 33 g/dL (ref 30.0–36.0)
MCV: 95.1 fl (ref 78.0–100.0)
Platelets: 296 10*3/uL (ref 150.0–400.0)
RBC: 3.9 Mil/uL — ABNORMAL LOW (ref 4.22–5.81)
RDW: 14.5 % (ref 11.5–15.5)
WBC: 6.4 10*3/uL (ref 4.0–10.5)

## 2017-10-31 LAB — MICROALBUMIN / CREATININE URINE RATIO
Creatinine,U: 148.2 mg/dL
MICROALB/CREAT RATIO: 7.5 mg/g (ref 0.0–30.0)
Microalb, Ur: 11.2 mg/dL — ABNORMAL HIGH (ref 0.0–1.9)

## 2017-10-31 LAB — HEMOGLOBIN A1C: HEMOGLOBIN A1C: 5.5 % (ref 4.6–6.5)

## 2017-10-31 LAB — BASIC METABOLIC PANEL
BUN: 17 mg/dL (ref 6–23)
CALCIUM: 9.4 mg/dL (ref 8.4–10.5)
CO2: 30 meq/L (ref 19–32)
CREATININE: 1.02 mg/dL (ref 0.40–1.50)
Chloride: 103 mEq/L (ref 96–112)
GFR: 73.75 mL/min (ref >60.00)
Glucose, Bld: 136 mg/dL — ABNORMAL HIGH (ref 70–99)
Potassium: 4.5 mEq/L (ref 3.5–5.1)
Sodium: 139 mEq/L (ref 135–145)

## 2017-10-31 LAB — TSH: TSH: 1.78 u[IU]/mL (ref 0.35–4.50)

## 2017-10-31 LAB — MAGNESIUM: MAGNESIUM: 1.7 mg/dL (ref 1.5–2.5)

## 2017-10-31 NOTE — Telephone Encounter (Signed)
Patient daughter Randye Lobo [phone] contacted office and  would like for the nurse to call her and go over today's visit 10/31/17 after patient is seen by provider. Please contact Randye Lobo.

## 2017-10-31 NOTE — Progress Notes (Signed)
Chief Complaint  Patient presents with  . Medicare Wellness    with RN    Subjective: Patient is a 83 y.o. male here for intermittent palpitations.  Over the past 2 days, the patient has been having intermittent "thumping" in his left chest.  He is not having any shortness of breath or chest pain.  No history of atrial fibrillation or irregular heartbeat.  He does not have known coronary artery disease.  No change in medication or diet.  No areas of easy bruising or bleeding. Nothing seems to exacerbate it and relief is usually unnecessary due to how quickly it abates.    ROS: Heart: Denies chest pain  Lungs: Denies SOB   Family History  Problem Relation Age of Onset  . Depression Mother   . Alcohol abuse Father   . Alcohol abuse Brother    Past Medical History:  Diagnosis Date  . Diabetes mellitus   . GERD (gastroesophageal reflux disease)   . Heart murmur   . Hyperlipidemia   . Hypertension   . Personal history of colonic polyps   . Stroke ([place]) 03/2011   R lat thalamus, post in int capsule  . Thyroid disease    Allergies  Allergen Reactions  . Atenolol     REACTION: dyspnea  . Clonidine Hydrochloride     REACTION: dry mouth  . Prednisone     REACTION: Hair loss  . Verapamil     REACTION: hallucinations    Current Outpatient Medications:  .  alfuzosin (UROXATRAL) 10 MG 24 hr tablet, TAKE 1 TABLET DAILY, Disp: 90 tablet, Rfl: 1 .  ferrous sulfate 325 (65 FE) MG tablet, Take 1 tablet by mouth 2 (two) times daily., Disp: , Rfl:  .  finasteride (PROSCAR) 5 MG tablet, Take 1 tablet (5 mg total) by mouth daily., Disp: 90 tablet, Rfl: 0 .  levothyroxine (SYNTHROID, LEVOTHROID) 75 MCG tablet, TAKE 1 TABLET DAILY, Disp: 90 tablet, Rfl: 0 .  metFORMIN (GLUCOPHAGE) 1000 MG tablet, TAKE 1 TABLET TWICE A DAY WITH A MEAL, Disp: 90 tablet, Rfl: 1 .  omeprazole (PRILOSEC) 40 MG capsule, TAKE 1 CAPSULE DAILY, Disp: 90 capsule, Rfl: 3 .  pravastatin (PRAVACHOL) 40 MG tablet, TAKE  1 TABLET DAILY (REPEAT LABS ARE DUE NOW), Disp: 90 tablet, Rfl: 0 .  vitamin B-12 (CYANOCOBALAMIN) 1000 MCG tablet, Take 1 tablet (1,000 mcg total) by mouth daily., Disp: 30 tablet, Rfl: 1  Objective: BP (!) 142/64 (BP Location: Left Arm, Patient Position: Sitting, Cuff Size: Normal)   Pulse 79   Wt 168 lb 6.4 oz (76.4 kg)   SpO2 97%   BMI 24.51 kg/m  General: Awake, appears stated age HEENT: MMM, EOMi Heart: RRR, no bruits or LE edema Lungs: CTAB, no rales, wheezes or rhonchi. No accessory muscle use Psych: Age appropriate judgment and insight, normal affect and mood  Assessment and Plan: Palpitations - Plan: EKG 12-Lead, TSH, CBC, Basic metabolic panel, Magnesium  Encounter for immunization - Plan: Flu vaccine HIGH DOSE PF  Encounter for Medicare annual wellness exam  Diabetes mellitus without complication ([place]) - Plan: Hemoglobin A1c, Microalbumin / creatinine urine ratio  Orders as above. EKG shows PVC. Not particularly concerning, but will check lytes. Check DM labs and will f/u with Dr. Etter Sjogren, his reg PCP. Can decide if he needs to stay on Metformin. Also, given memory issues, will have to discuss driving with Dr. Etter Sjogren.  F/u in 6 weeks.  The patient voiced understanding and agreement to the  plan.  Shelda Pal, DO 10/31/17  2:37 PM

## 2017-10-31 NOTE — Patient Instructions (Addendum)
Douglas Grant , Thank you for taking time to come for your Medicare Wellness Visit. I appreciate your ongoing commitment to your health goals. Please review the following plan we discussed and let me know if I can assist you in the future.   These are the goals we discussed: maintain current health   This is a list of the screening recommended for you and due dates:  Health Maintenance  Topic Date Due  . Urine Protein Check  10/02/2016  . Colon Cancer Screening  10/18/2016  . Eye exam for diabetics  11/14/2016  . Hemoglobin A1C  12/12/2016  . Complete foot exam   06/12/2017  . Tetanus Vaccine  05/27/2027  . Flu Shot  Completed  . Pneumonia vaccines  Completed    Health Maintenance, Male A healthy lifestyle and preventive care is important for your health and wellness. Ask your health care provider about what schedule of regular examinations is right for you. What should I know about weight and diet? Eat a Healthy Diet  Eat plenty of vegetables, fruits, whole grains, low-fat dairy products, and lean protein.  Do not eat a lot of foods high in solid fats, added sugars, or salt.  Maintain a Healthy Weight Regular exercise can help you achieve or maintain a healthy weight. You should:  Do at least 150 minutes of exercise each week. The exercise should increase your heart rate and make you sweat (moderate-intensity exercise).  Do strength-training exercises at least twice a week.  Watch Your Levels of Cholesterol and Blood Lipids  Have your blood tested for lipids and cholesterol every 5 years starting at 81 years of age. If you are at high risk for heart disease, you should start having your blood tested when you are 81 years old. You may need to have your cholesterol levels checked more often if: ? Your lipid or cholesterol levels are high. ? You are older than 81 years of age. ? You are at high risk for heart disease.  What should I know about cancer screening? Many types of  cancers can be detected early and may often be prevented. Lung Cancer  You should be screened every year for lung cancer if: ? You are a current smoker who has smoked for at least 30 years. ? You are a former smoker who has quit within the past 15 years.  Talk to your health care provider about your screening options, when you should start screening, and how often you should be screened.  Colorectal Cancer  Routine colorectal cancer screening usually begins at 81 years of age and should be repeated every 5-10 years until you are 81 years old. You may need to be screened more often if early forms of precancerous polyps or small growths are found. Your health care provider may recommend screening at an earlier age if you have risk factors for colon cancer.  Your health care provider may recommend using home test kits to check for hidden blood in the stool.  A small camera at the end of a tube can be used to examine your colon (sigmoidoscopy or colonoscopy). This checks for the earliest forms of colorectal cancer.  Prostate and Testicular Cancer  Depending on your age and overall health, your health care provider may do certain tests to screen for prostate and testicular cancer.  Talk to your health care provider about any symptoms or concerns you have about testicular or prostate cancer.  Skin Cancer  Check your skin from head to  toe regularly.  Tell your health care provider about any new moles or changes in moles, especially if: ? There is a change in a mole's size, shape, or color. ? You have a mole that is larger than a pencil eraser.  Always use sunscreen. Apply sunscreen liberally and repeat throughout the day.  Protect yourself by wearing long sleeves, pants, a wide-brimmed hat, and sunglasses when outside.  What should I know about heart disease, diabetes, and high blood pressure?  If you are 38-20 years of age, have your blood pressure checked every 3-5 years. If you are  56 years of age or older, have your blood pressure checked every year. You should have your blood pressure measured twice-once when you are at a hospital or clinic, and once when you are not at a hospital or clinic. Record the average of the two measurements. To check your blood pressure when you are not at a hospital or clinic, you can use: ? An automated blood pressure machine at a pharmacy. ? A home blood pressure monitor.  Talk to your health care provider about your target blood pressure.  If you are between 63-49 years old, ask your health care provider if you should take aspirin to prevent heart disease.  Have regular diabetes screenings by checking your fasting blood sugar level. ? If you are at a normal weight and have a low risk for diabetes, have this test once every three years after the age of 80. ? If you are overweight and have a high risk for diabetes, consider being tested at a younger age or more often.  A one-time screening for abdominal aortic aneurysm (AAA) by ultrasound is recommended for men aged 101-75 years who are current or former smokers. What should I know about preventing infection? Hepatitis B If you have a higher risk for hepatitis B, you should be screened for this virus. Talk with your health care provider to find out if you are at risk for hepatitis B infection. Hepatitis C Blood testing is recommended for:  Everyone born from [birth year] through 1963.  Anyone with known risk factors for hepatitis C.  Sexually Transmitted Diseases (STDs)  You should be screened each year for STDs including gonorrhea and chlamydia if: ? You are sexually active and are younger than 81 years of age. ? You are older than 81 years of age and your health care provider tells you that you are at risk for this type of infection. ? Your sexual activity has changed since you were last screened and you are at an increased risk for chlamydia or gonorrhea. Ask your health care provider if you  are at risk.  Talk with your health care provider about whether you are at high risk of being infected with HIV. Your health care provider may recommend a prescription medicine to help prevent HIV infection.  What else can I do?  Schedule regular health, dental, and eye exams.  Stay current with your vaccines (immunizations).  Do not use any tobacco products, such as cigarettes, chewing tobacco, and e-cigarettes. If you need help quitting, ask your health care provider.  Limit alcohol intake to no more than 2 drinks per day. One drink equals 12 ounces of beer, 5 ounces of wine, or 1 ounces of hard liquor.  Do not use street drugs.  Do not share needles.  Ask your health care provider for help if you need support or information about quitting drugs.  Tell your health care provider if you  often feel depressed.  Tell your health care provider if you have ever been abused or do not feel safe at home. This information is not intended to replace advice given to you by your health care provider. Make sure you discuss any questions you have with your health care provider. Document Released: 06/07/2008 Document Revised: 08/08/2016 Document Reviewed: 09/13/2015 Elsevier Interactive Patient Education  [employer].

## 2017-10-31 NOTE — Telephone Encounter (Signed)
Called Barbara left message to call back;

## 2017-11-01 NOTE — Telephone Encounter (Signed)
Fine with me

## 2017-11-01 NOTE — Telephone Encounter (Signed)
OK w me. B12 was ordered as future, but must have been overlooked because I had ordered some labs for that day. TY.

## 2017-11-01 NOTE — Telephone Encounter (Signed)
Patients daughter informed of change ok and canceled upcoming appt with Dr. Berneta Sages new appt with Dr. Nani Ravens.

## 2017-11-01 NOTE — Telephone Encounter (Signed)
The family/daughter informed of instructions. The daughter stated the patient would like to change PCP's from Dr. Etter Sjogren to Dr. Nani Ravens. She also wants to make sure next future labs includes vitamin B12

## 2017-11-07 ENCOUNTER — Encounter: Payer: Self-pay | Admitting: Family Medicine

## 2017-11-07 ENCOUNTER — Telehealth: Payer: Self-pay | Admitting: Family Medicine

## 2017-11-07 ENCOUNTER — Ambulatory Visit (INDEPENDENT_AMBULATORY_CARE_PROVIDER_SITE_OTHER): Payer: Medicare Other | Admitting: Family Medicine

## 2017-11-07 VITALS — BP 138/80 | HR 86 | Temp 98.9°F | Ht 69.5 in | Wt 169.1 lb

## 2017-11-07 DIAGNOSIS — R350 Frequency of micturition: Secondary | ICD-10-CM

## 2017-11-07 DIAGNOSIS — F0390 Unspecified dementia without behavioral disturbance: Secondary | ICD-10-CM

## 2017-11-07 LAB — POC URINALSYSI DIPSTICK (AUTOMATED)
Bilirubin, UA: NEGATIVE
Glucose, UA: NEGATIVE
Ketones, UA: NEGATIVE
Leukocytes, UA: NEGATIVE
NITRITE UA: NEGATIVE
RBC UA: NEGATIVE
UROBILINOGEN UA: NEGATIVE U/dL — AB
pH, UA: 6 (ref 5.0–8.0)

## 2017-11-07 MED ORDER — DONEPEZIL HCL 5 MG PO TABS: 5.0000 mg | ORAL_TABLET | Freq: Every day | ORAL | 2 refills | Status: DC

## 2017-11-07 NOTE — Addendum Note (Signed)
Addended by: Sharon Seller B on: 11/07/2017 02:24 PM   Modules accepted: Orders

## 2017-11-07 NOTE — Patient Instructions (Addendum)
Stop Metformin. This is not likely to provide further benefit.   We are starting a medicine to help prevent worsening of memory. Another option is to refer to a neurologist who specializes in memory issues. Let me know if you would like to have a referral or if we decide not to take the new medicine.   This does not look like a urinary tract infection.   Let us know if you need anything.

## 2017-11-07 NOTE — Progress Notes (Signed)
Chief Complaint  Patient presents with  . Follow-up    urinating problems    Subjective: Patient is a 83 y.o. male here for confusion.  He is here with his son-in-law.  The patient has had memory issues for many months now.  Over the past several weeks, it is gotten worse.  His wife recently had a fracture in her vertebra and is in the hospital.  He will have issues with agitation and confusion in the hospital.  1 of the nurses suggested he be evaluated for potential UTI.  While he has urinary frequency with a known history of BPH, he is not having any other changes such as bleeding, pain, abdominal pain, fevers, drainage, or skin changes.  No recent medication changes.  He is not currently driving.  It seems to get worse at night.  ROS: GU: As noted in HPI Const: No fevers  Family History  Problem Relation Age of Onset  . Depression Mother   . Alcohol abuse Father   . Alcohol abuse Brother    Past Medical History:  Diagnosis Date  . Diabetes mellitus   . GERD (gastroesophageal reflux disease)   . Heart murmur   . Hyperlipidemia   . Hypertension   . Personal history of colonic polyps   . Stroke ([place]) 03/2011   R lat thalamus, post in int capsule  . Thyroid disease    Allergies  Allergen Reactions  . Atenolol     REACTION: dyspnea  . Clonidine Hydrochloride     REACTION: dry mouth  . Prednisone     REACTION: Hair loss  . Verapamil     REACTION: hallucinations    Current Outpatient Medications:  .  alfuzosin (UROXATRAL) 10 MG 24 hr tablet, TAKE 1 TABLET DAILY, Disp: 90 tablet, Rfl: 1 .  ferrous sulfate 325 (65 FE) MG tablet, Take 1 tablet by mouth 2 (two) times daily., Disp: , Rfl:  .  finasteride (PROSCAR) 5 MG tablet, Take 1 tablet (5 mg total) by mouth daily., Disp: 90 tablet, Rfl: 0 .  levothyroxine (SYNTHROID, LEVOTHROID) 75 MCG tablet, TAKE 1 TABLET DAILY, Disp: 90 tablet, Rfl: 0 .  omeprazole (PRILOSEC) 40 MG capsule, TAKE 1 CAPSULE DAILY, Disp: 90 capsule, Rfl:  3 .  pravastatin (PRAVACHOL) 40 MG tablet, TAKE 1 TABLET DAILY (REPEAT LABS ARE DUE NOW), Disp: 90 tablet, Rfl: 0 .  vitamin B-12 (CYANOCOBALAMIN) 1000 MCG tablet, Take 1 tablet (1,000 mcg total) by mouth daily., Disp: 30 tablet, Rfl: 1 .  donepezil (ARICEPT) 5 MG tablet, Take 1 tablet (5 mg total) at bedtime by mouth., Disp: 30 tablet, Rfl: 2  Objective: BP 138/80 (BP Location: Left Arm, Patient Position: Sitting, Cuff Size: Normal)   Pulse 86   Temp 98.9 F (37.2 C) (Oral)   Ht 5' 9.5" (1.765 m)   Wt 169 lb 2 oz (76.7 kg)   SpO2 96%   BMI 24.62 kg/m  General: Awake, appears stated age Heart: RRR, no edema Lungs: CTAB, no rales, wheezes or rhonchi. No accessory muscle use Abd: BS+, soft, NT, ND, no masses or organomegaly Neuro: Alert to person and place; unsure of date and president Psych: normal affect and mood; sometimes tangential thoughts/speech  Assessment and Plan: Dementia without behavioral disturbance, unspecified dementia type - Plan: POCT Urinalysis Dipstick (Automated), Urine Culture  Urinary frequency  UA neg. Will culture. Start Aricept. Offered Neuro referral but will defer final decision to daughter. Will need MMSE score and consider memantine at next visit  if severe enough. Will officially stop Metformin.  F/u in 4 weeks. The patient and his son in law voiced understanding and agreement to the plan.  [place], DO 11/07/17  12:13 PM

## 2017-11-07 NOTE — Telephone Encounter (Signed)
Sent to [facility] on [address]. Patients daughter informed

## 2017-11-07 NOTE — Progress Notes (Signed)
Pre visit review using our clinic review tool, if applicable. No additional management support is needed unless otherwise documented below in the visit note. 

## 2017-11-07 NOTE — Telephone Encounter (Signed)
[name], pt's daughter called in [place]: [phone]  She would like to know if provider could fax Rx for donepezil  in to pharmacy instead. She would like to pick up when she gets off today if possible.   Pharmacy: [facility], [place].    She would like to have a call once Rx has been faxed if possible.

## 2017-11-08 LAB — URINE CULTURE
MICRO NUMBER: 81289770
RESULT: NO GROWTH
SPECIMEN QUALITY:: ADEQUATE

## 2017-11-11 ENCOUNTER — Other Ambulatory Visit: Payer: Self-pay | Admitting: Family Medicine

## 2017-11-19 ENCOUNTER — Other Ambulatory Visit: Payer: Self-pay | Admitting: Family Medicine

## 2017-11-24 ENCOUNTER — Other Ambulatory Visit: Payer: Self-pay | Admitting: Family Medicine

## 2017-12-06 ENCOUNTER — Ambulatory Visit: Payer: Medicare Other | Admitting: Family Medicine

## 2017-12-10 ENCOUNTER — Ambulatory Visit: Payer: Medicare Other | Admitting: Family Medicine

## 2017-12-11 ENCOUNTER — Ambulatory Visit: Payer: Medicare Other | Admitting: Family Medicine

## 2017-12-19 ENCOUNTER — Ambulatory Visit (INDEPENDENT_AMBULATORY_CARE_PROVIDER_SITE_OTHER): Payer: Medicare Other | Admitting: Family Medicine

## 2017-12-19 ENCOUNTER — Ambulatory Visit: Payer: Medicare Other | Admitting: Family Medicine

## 2017-12-19 ENCOUNTER — Encounter: Payer: Self-pay | Admitting: Family Medicine

## 2017-12-19 VITALS — BP 148/68 | HR 68 | Temp 98.8°F | Ht 70.0 in | Wt 170.5 lb

## 2017-12-19 DIAGNOSIS — F0390 Unspecified dementia without behavioral disturbance: Secondary | ICD-10-CM

## 2017-12-19 MED ORDER — DONEPEZIL HCL 10 MG PO TABS: 10.0000 mg | ORAL_TABLET | Freq: Every day | ORAL | 1 refills | Status: DC

## 2017-12-19 NOTE — Progress Notes (Signed)
Pre visit review using our clinic review tool, if applicable. No additional management support is needed unless otherwise documented below in the visit note. 

## 2017-12-19 NOTE — Patient Instructions (Signed)
Take 2 tabs of 5 mg (10 total) of the Don pill. A new dose has been called in.  Let us know if you need anything.

## 2017-12-19 NOTE — Progress Notes (Signed)
Chief Complaint  Patient presents with  . Establish Care    transfer from Dr. Etter Sjogren    Subjective: Patient is a 83 y.o. male here for dementia evaluation.  The patient was seen around 1 month ago after he had an episode of sundowning.  There is a concern for dementia and worsened memory.  He has stopped driving.  He was started on Aricept around 1 month ago, he is taking 5 mg nightly.  He is tolerating this medicine well and compliance is reported.  Neuropsychiatric evaluation was offered but not taken.  His behavior has been normal overall, however he had a where he had hallucinations and confusion.  ROS: Neuro: As noted in HPI  Family History  Problem Relation Age of Onset  . Depression Mother   . Alcohol abuse Father   . Alcohol abuse Brother    Past Medical History:  Diagnosis Date  . Diabetes mellitus   . GERD (gastroesophageal reflux disease)   . Heart murmur   . Hyperlipidemia   . Hypertension   . Personal history of colonic polyps   . Stroke ([place]) 03/2011   R lat thalamus, post in int capsule  . Thyroid disease    Allergies  Allergen Reactions  . Atenolol     REACTION: dyspnea  . Clonidine Hydrochloride     REACTION: dry mouth  . Prednisone     REACTION: Hair loss  . Verapamil     REACTION: hallucinations    Current Outpatient Medications:  .  alfuzosin (UROXATRAL) 10 MG 24 hr tablet, TAKE 1 TABLET DAILY, Disp: 90 tablet, Rfl: 1 .  ferrous sulfate 325 (65 FE) MG tablet, Take 1 tablet by mouth 2 (two) times daily., Disp: , Rfl:  .  finasteride (PROSCAR) 5 MG tablet, TAKE 1 TABLET DAILY, Disp: 90 tablet, Rfl: 0 .  LEVOXYL 75 MCG tablet, TAKE 1 TABLET DAILY, Disp: 90 tablet, Rfl: 0 .  omeprazole (PRILOSEC) 40 MG capsule, TAKE 1 CAPSULE DAILY, Disp: 90 capsule, Rfl: 3 .  pravastatin (PRAVACHOL) 40 MG tablet, TAKE 1 TABLET DAILY (REPEAT LABS ARE DUE NOW), Disp: 90 tablet, Rfl: 0 .  vitamin B-12 (CYANOCOBALAMIN) 1000 MCG tablet, Take 1 tablet (1,000 mcg total) by  mouth daily., Disp: 30 tablet, Rfl: 1 .  donepezil (ARICEPT) 10 MG tablet, Take 1 tablet (10 mg total) by mouth at bedtime., Disp: 90 tablet, Rfl: 1  Objective: BP (!) 148/68 (BP Location: Right Arm, Patient Position: Sitting, Cuff Size: Normal)   Pulse 68   Temp 98.8 F (37.1 C) (Oral)   Ht 5\' 10"  (1.778 m)   Wt 170 lb 8 oz (77.3 kg)   SpO2 98%   BMI 24.46 kg/m  General: Awake, appears stated age HEENT: MMM, EOMi Heart: RRR, no murmurs Lungs: CTAB, no rales, wheezes or rhonchi. No accessory muscle use Neuro: Alert and oriented to person, knew this is a doctor's office, but not that it is [facility] or [place], knows President Trump, could not remember his first name, knows month and date, not year (said 2008). No recollection of recall, able to spell world forward, not backward. Immediate recall normal.  Psych: Age appropriate judgment and insight, normal affect and mood  Assessment and Plan: Dementia without behavioral disturbance, unspecified dementia type - Plan: donepezil (ARICEPT) 10 MG tablet  Increase dose of Aricept from 5 mg daily to 10 mg daily.  We will try to add Namenda in the next 2-3 weeks if he tolerates this change well.  I did offer a referral to neuropsychology, however the daughter would like to hold off on this for now.  This is reasonable as it would likely not change much in the meantime. Follow-up in 3 months to see how he is doing. The patient and his daughter voiced understanding and agreement to the plan.  Greater than 25 minutes were spent face to face with the patient with greater than 50% of this time spent counseling on dementia, behavior disturbance, blood pressure goals for his age, treatment for dementia and follow up.   [place], DO 12/19/17  4:47 PM

## 2017-12-24 DIAGNOSIS — E785 Hyperlipidemia, unspecified: Secondary | ICD-10-CM | POA: Diagnosis present

## 2017-12-24 DIAGNOSIS — R410 Disorientation, unspecified: Secondary | ICD-10-CM | POA: Diagnosis not present

## 2017-12-24 DIAGNOSIS — Z87891 Personal history of nicotine dependence: Secondary | ICD-10-CM | POA: Diagnosis not present

## 2017-12-24 DIAGNOSIS — I10 Essential (primary) hypertension: Secondary | ICD-10-CM | POA: Diagnosis present

## 2017-12-24 DIAGNOSIS — F0391 Unspecified dementia with behavioral disturbance: Secondary | ICD-10-CM | POA: Diagnosis present

## 2017-12-24 DIAGNOSIS — F918 Other conduct disorders: Secondary | ICD-10-CM | POA: Diagnosis not present

## 2017-12-24 DIAGNOSIS — Z888 Allergy status to other drugs, medicaments and biological substances status: Secondary | ICD-10-CM | POA: Diagnosis not present

## 2017-12-24 DIAGNOSIS — F323 Major depressive disorder, single episode, severe with psychotic features: Secondary | ICD-10-CM | POA: Diagnosis not present

## 2017-12-24 DIAGNOSIS — Z79899 Other long term (current) drug therapy: Secondary | ICD-10-CM | POA: Diagnosis not present

## 2017-12-24 DIAGNOSIS — R441 Visual hallucinations: Secondary | ICD-10-CM | POA: Diagnosis not present

## 2017-12-24 DIAGNOSIS — E039 Hypothyroidism, unspecified: Secondary | ICD-10-CM | POA: Diagnosis present

## 2017-12-24 DIAGNOSIS — R440 Auditory hallucinations: Secondary | ICD-10-CM | POA: Diagnosis not present

## 2017-12-24 DIAGNOSIS — F419 Anxiety disorder, unspecified: Secondary | ICD-10-CM | POA: Diagnosis not present

## 2017-12-24 DIAGNOSIS — E119 Type 2 diabetes mellitus without complications: Secondary | ICD-10-CM | POA: Diagnosis present

## 2017-12-24 DIAGNOSIS — D649 Anemia, unspecified: Secondary | ICD-10-CM | POA: Diagnosis not present

## 2017-12-24 DIAGNOSIS — N400 Enlarged prostate without lower urinary tract symptoms: Secondary | ICD-10-CM | POA: Diagnosis present

## 2017-12-24 DIAGNOSIS — E559 Vitamin D deficiency, unspecified: Secondary | ICD-10-CM | POA: Diagnosis not present

## 2017-12-24 DIAGNOSIS — I491 Atrial premature depolarization: Secondary | ICD-10-CM | POA: Diagnosis not present

## 2017-12-24 DIAGNOSIS — J329 Chronic sinusitis, unspecified: Secondary | ICD-10-CM | POA: Diagnosis not present

## 2017-12-25 DIAGNOSIS — N400 Enlarged prostate without lower urinary tract symptoms: Secondary | ICD-10-CM | POA: Insufficient documentation

## 2017-12-25 HISTORY — DX: Benign prostatic hyperplasia without lower urinary tract symptoms: N40.0

## 2018-01-02 MED ORDER — FINASTERIDE 5 MG PO TABS
5.00 | ORAL_TABLET | ORAL | Status: DC
Start: 2018-01-02 — End: 2018-01-02

## 2018-01-02 MED ORDER — FERROUS SULFATE 325 (65 FE) MG PO TABS
325.00 | ORAL_TABLET | ORAL | Status: DC
Start: 2018-01-02 — End: 2018-01-02

## 2018-01-02 MED ORDER — TAMSULOSIN HCL 0.4 MG PO CAPS
0.40 | ORAL_CAPSULE | ORAL | Status: DC
Start: 2018-01-02 — End: 2018-01-02

## 2018-01-02 MED ORDER — VITAMIN D3 25 MCG (1000 UNIT) PO TABS
ORAL_TABLET | ORAL | Status: DC
Start: 2018-01-02 — End: 2018-01-02

## 2018-01-02 MED ORDER — LORAZEPAM 2 MG/ML IJ SOLN
0.50 | INTRAMUSCULAR | Status: DC
Start: ? — End: 2018-01-02

## 2018-01-02 MED ORDER — LORAZEPAM 0.5 MG PO TABS
.50 | ORAL_TABLET | ORAL | Status: DC
Start: ? — End: 2018-01-02

## 2018-01-02 MED ORDER — PRAVASTATIN SODIUM 40 MG PO TABS
40.00 | ORAL_TABLET | ORAL | Status: DC
Start: 2018-01-02 — End: 2018-01-02

## 2018-01-02 MED ORDER — ACETAMINOPHEN 325 MG PO TABS
650.00 | ORAL_TABLET | ORAL | Status: DC
Start: ? — End: 2018-01-02

## 2018-01-02 MED ORDER — VITAMIN B-12 1000 MCG PO TABS
ORAL_TABLET | ORAL | Status: DC
Start: 2018-01-02 — End: 2018-01-02

## 2018-01-02 MED ORDER — TRAZODONE HCL 50 MG PO TABS
50.00 | ORAL_TABLET | ORAL | Status: DC
Start: ? — End: 2018-01-02

## 2018-01-02 MED ORDER — ANTACID & ANTIGAS 200-200-20 MG/5ML PO SUSP
30.00 | ORAL | Status: DC
Start: ? — End: 2018-01-02

## 2018-01-02 MED ORDER — BENZOCAINE-MENTHOL 15-3.6 MG MT LOZG
LOZENGE | OROMUCOSAL | Status: DC
Start: ? — End: 2018-01-02

## 2018-01-02 MED ORDER — OLANZAPINE 10 MG IM SOLR
2.50 | INTRAMUSCULAR | Status: DC
Start: ? — End: 2018-01-02

## 2018-01-02 MED ORDER — MAGNESIUM HYDROXIDE 400 MG/5ML PO SUSP
15.00 | ORAL | Status: DC
Start: ? — End: 2018-01-02

## 2018-01-02 MED ORDER — HYDRALAZINE HCL 10 MG PO TABS
10.00 | ORAL_TABLET | ORAL | Status: DC
Start: ? — End: 2018-01-02

## 2018-01-02 MED ORDER — LEVOTHYROXINE SODIUM 75 MCG PO TABS
ORAL_TABLET | ORAL | Status: DC
Start: 2018-01-02 — End: 2018-01-02

## 2018-01-02 MED ORDER — OLANZAPINE 5 MG PO TBDP
2.50 | ORAL_TABLET | ORAL | Status: DC
Start: ? — End: 2018-01-02

## 2018-01-02 MED ORDER — ONDANSETRON HCL 4 MG PO TABS
4.00 | ORAL_TABLET | ORAL | Status: DC
Start: ? — End: 2018-01-02

## 2018-01-02 MED ORDER — QUETIAPINE FUMARATE 100 MG PO TABS
100.00 | ORAL_TABLET | ORAL | Status: DC
Start: 2018-01-01 — End: 2018-01-02

## 2018-01-02 MED ORDER — DONEPEZIL HCL 5 MG PO TABS
10.00 | ORAL_TABLET | ORAL | Status: DC
Start: 2018-01-01 — End: 2018-01-02

## 2018-01-02 MED ORDER — VENLAFAXINE HCL ER 75 MG PO CP24
75.00 | ORAL_CAPSULE | ORAL | Status: DC
Start: 2018-01-02 — End: 2018-01-02

## 2018-01-02 MED ORDER — PANTOPRAZOLE SODIUM 20 MG PO TBEC
20.00 | DELAYED_RELEASE_TABLET | ORAL | Status: DC
Start: 2018-01-02 — End: 2018-01-02

## 2018-01-06 ENCOUNTER — Encounter: Payer: Self-pay | Admitting: Family Medicine

## 2018-01-06 ENCOUNTER — Inpatient Hospital Stay: Payer: Medicare Other | Admitting: Family Medicine

## 2018-01-06 ENCOUNTER — Ambulatory Visit (INDEPENDENT_AMBULATORY_CARE_PROVIDER_SITE_OTHER): Payer: Medicare Other | Admitting: Family Medicine

## 2018-01-06 VITALS — BP 138/68 | HR 64 | Temp 98.3°F | Ht 70.0 in | Wt 169.0 lb

## 2018-01-06 DIAGNOSIS — R443 Hallucinations, unspecified: Secondary | ICD-10-CM

## 2018-01-06 DIAGNOSIS — Z09 Encounter for follow-up examination after completed treatment for conditions other than malignant neoplasm: Secondary | ICD-10-CM

## 2018-01-06 MED ORDER — QUETIAPINE FUMARATE 100 MG PO TABS: 100.0000 mg | ORAL_TABLET | Freq: Every day | ORAL | 3 refills | Status: DC

## 2018-01-06 MED ORDER — VENLAFAXINE HCL ER 75 MG PO CP24: 75.0000 mg | ORAL_CAPSULE | Freq: Every day | ORAL | 3 refills | Status: DC

## 2018-01-06 NOTE — Progress Notes (Signed)
Chief Complaint  Patient presents with  . Hospitalization Follow-up    HPI Douglas Grant is a 84 y.o. y.o. male who presents for a transition of care visit.  Pt was discharged from [facility] on 01/01/18.  Within 48 business hours of discharge our office contacted him via telephone to coordinate his care and needs. He is here w his daughter [name] who helps with the hx.   Pt was admitted from 12/24/17-01/01/18 at [facility] for AMS/Hallucinations. His medication regimen remained the same, but 2 new medicines were added. He is not taking Effexor 75 mg XR daily and Seroquel 100 mg qhs. He is tolerating these medicines well and reports compliance. He has not had any hallucinations and is starting to sleep better. While going thru ros, he mentioned loose stools around every other day that started around the same time he started the medicine. No bleeding, n/v, fevers.   His wife of 64 years just passed away. He appears to be doing well overall given the circumstance.  Social History   Socioeconomic History  . Marital status: Widowed  Tobacco Use  . Smoking status: Former Smoker    Last attempt to quit: 04/18/1985    Years since quitting: 32.7  . Smokeless tobacco: Never Used  Substance and Sexual Activity  . Alcohol use: No    Alcohol/week: 0.0 oz  . Drug use: No  . Sexual activity: No   Past Medical History:  Diagnosis Date  . Diabetes mellitus   . GERD (gastroesophageal reflux disease)   . Heart murmur   . Hyperlipidemia   . Hypertension   . Personal history of colonic polyps   . Stroke ([place]) 03/2011   R lat thalamus, post in int capsule  . Thyroid disease    Family History  Problem Relation Age of Onset  . Depression Mother   . Alcohol abuse Father   . Alcohol abuse Brother    Allergies as of 01/06/2018      Reactions   Atenolol    REACTION: dyspnea   Clonidine Hydrochloride    REACTION: dry mouth   Prednisone    REACTION: Hair loss   Verapamil    REACTION:  hallucinations      Medication List        Accurate as of 01/06/18 11:59 PM. Always use your most recent med list.          alfuzosin 10 MG 24 hr tablet Commonly known as:  UROXATRAL TAKE 1 TABLET DAILY   donepezil 10 MG tablet Commonly known as:  ARICEPT Take 1 tablet (10 mg total) by mouth at bedtime.   ferrous sulfate 325 (65 FE) MG tablet Take 1 tablet by mouth 2 (two) times daily.   finasteride 5 MG tablet Commonly known as:  PROSCAR TAKE 1 TABLET DAILY   LEVOXYL 75 MCG tablet Generic drug:  levothyroxine TAKE 1 TABLET DAILY   omeprazole 40 MG capsule Commonly known as:  PRILOSEC TAKE 1 CAPSULE DAILY   pravastatin 40 MG tablet Commonly known as:  PRAVACHOL TAKE 1 TABLET DAILY (REPEAT LABS ARE DUE NOW)   QUEtiapine 100 MG tablet Commonly known as:  SEROQUEL Take 1 tablet (100 mg total) by mouth at bedtime.   venlafaxine XR 75 MG 24 hr capsule Commonly known as:  EFFEXOR XR Take 1 capsule (75 mg total) by mouth daily with breakfast.   vitamin B-12 1000 MCG tablet Commonly known as:  CYANOCOBALAMIN Take 1 tablet (1,000 mcg total) by mouth  daily.       ROS:  Constitutional: No fevers or chills, no weight loss HEENT: No headaches, hearing loss, or runny nose, no sore throat Heart: No chest pain Lungs: No SOB, no cough Abd: +loose stools as noted above, no pain, no N/V GU: No urinary complaints Neuro: No numbness, tingling or weakness Msk: No joint or muscle pain  Objective BP 138/68 (BP Location: Left Arm, Patient Position: Sitting, Cuff Size: Normal)   Pulse 64   Temp 98.3 F (36.8 C) (Oral)   Ht 5\' 10"  (1.778 m)   Wt 169 lb (76.7 kg)   SpO2 95%   BMI 24.25 kg/m  General Appearance:  awake, alert, in no acute distress and well developed, well nourished Skin:  there are no suspicious lesions or rashes of concern Head/face:  NCAT Eyes:  EOMI, PERRLA Ears:  canals and TMs NI Nose/Sinuses:  negative Mouth/Throat:  Mucosa moist, no lesions;  pharynx without erythema, edema or exudate. Neck:  neck- supple, no mass, non-tender and no jvd Lungs: Clear to auscultation.  No rales, rhonchi, or wheezing. Normal effort, no accessory muscle use. Heart:  Heart sounds are normal.  Regular rate and rhythm without murmur, gallop or rub. No bruits. Abdomen:  BS+, soft, NT, ND, no masses or organomegaly Musculoskeletal:  No muscle group atrophy or asymmetry, gait normal Neurologic:  Alert and oriented x 3, gait normal., reflexes normal and symmetric, strength and  sensation grossly normal Psych exam: Nml mood and affect  Hospital discharge follow-up  Hallucination  Discharge summary and medication list have been reviewed/reconciled.  Labs pending at the time of discharge have been reviewed or are still pending at the time of this visit.  Follow-up labs and appointments have been ordered and/or coordinated appropriately. Educational materials regarding the patient's admitting diagnosis provided.  TRANSITIONAL CARE MANAGEMENT CERTIFICATION:  I certify the following are true:   1. Communication with the patient/care giver was made within 2 business days of discharge.  2. Complexity of Medical decision making is moderate.  3. Face to face visit occurred within 14 days of discharge.   OK to eat nips (coffee candy) in moderation. The hospital told him 0 sugar allowed. Last a1c was 5.5 and we had taken him off of Metformin.  F/u as originally scheduled for a med check. The patient and his daughter voiced understanding and agreement to the plan.  [place], DO 01/07/18 8:48 AM

## 2018-01-06 NOTE — Patient Instructions (Signed)
OK to a have Nips sparingly.  Keep up the good work.   Let us know if you need anything.

## 2018-01-06 NOTE — Progress Notes (Signed)
Pre visit review using our clinic review tool, if applicable. No additional management support is needed unless otherwise documented below in the visit note. 

## 2018-01-07 ENCOUNTER — Encounter: Payer: Self-pay | Admitting: Family Medicine

## 2018-01-16 ENCOUNTER — Encounter: Payer: Self-pay | Admitting: Podiatry

## 2018-01-16 ENCOUNTER — Ambulatory Visit (INDEPENDENT_AMBULATORY_CARE_PROVIDER_SITE_OTHER): Payer: Medicare Other | Admitting: Podiatry

## 2018-01-16 DIAGNOSIS — L600 Ingrowing nail: Secondary | ICD-10-CM | POA: Diagnosis not present

## 2018-01-16 DIAGNOSIS — M79672 Pain in left foot: Secondary | ICD-10-CM | POA: Diagnosis not present

## 2018-01-16 DIAGNOSIS — M79671 Pain in right foot: Secondary | ICD-10-CM | POA: Diagnosis not present

## 2018-01-16 DIAGNOSIS — B351 Tinea unguium: Secondary | ICD-10-CM

## 2018-01-16 NOTE — Patient Instructions (Signed)
Seen for hypertrophic nails. All nails debrided. Return in 3 months or as needed.  

## 2018-01-16 NOTE — Progress Notes (Signed)
Subjective: 84 y.o. year old male patient presents complaining of painful nails. Patient requests toe nails trimmed.  Lost his wife last week but in good spirit.  Objective: Dermatologic: Thick yellow deformed nails x 10. Right great toe medial border is ingrown. No open skin lesions noted. Vascular: Pedal pulses are all palpable. Orthopedic: No gross deformities. Neurologic: All epicritic and tactile sensations grossly intact.  Assessment: Dystrophic mycotic nails x 10. Ingrown right great toe nail medial border.  Treatment: All mycotic nails debrided.  Return in 3 months or as needed.

## 2018-01-31 ENCOUNTER — Ambulatory Visit: Payer: Self-pay | Admitting: *Deleted

## 2018-01-31 NOTE — Telephone Encounter (Signed)
Pt's daughter [name] calling voicing concern of the pt having bruising to bilateral arms and finger with some bleeding to the area. Pt is at home by his self during the day, with a history of dementia and does not recall bumping into anything or hitting his arms. Pt is not on blood thinners at this time.Pt's daughter states that he has had bruising for the last couple of years but has not had bleeding previously. Pt's daughter states that the pt had band aids on the areas that were bleeding, but bleeding is not going on continuously. Advised pt's daughter, [name] that with the older population, skin bruises easily and skin tears can develop and cause bruising to areas were the skin integrity is already compromised. Advised pt's daughter to apply cold cloth to areas that have bleeding in order to stop it and to apply gauze to the area, specifically the kind that can be wrapped around the arm in order to secure the bandage in place versus using band aids. Pt's daughter verbalized understanding. Advised pt's daughter to return call to the office is symptoms worsened. Understanding verbalized Reason for Disposition . Minor bruise  Answer Assessment - Initial Assessment Questions 1. APPEARANCE of BRUISE: "Describe the bruise."      Dark colored 2. SIZE: "How large is the bruise?"      One on the finger nickel sized, ones on the arm are about the size of a quarter 3. NUMBER: "How many bruises are there?"      One on finger, 3-4 on right arm, left arm one on the back of elbow 4. LOCATION: "Where is the bruise located?"      Right and left arm 5. ONSET: "How long ago did the bruise occur?"      Off and on for the past couple of years bruising, today he has experienced bleeding to the areas 6. CAUSE: "Tell me how it happened."     Unsure,  7. MEDICAL HISTORY: "Do you have any medical problems that can cause easy bruising or bleeding?" (e.g., leukemia, liver disease, recent chemotherapy)     No 8.  MEDICATIONS : "Do you take any medications which thin the blood such as: aspirin, heparin, ibuprofen (NSAIDS), Plavix, or Coumadin?"     No 9. OTHER SYMPTOMS: "Do you have any other symptoms?"  (e.g., weakness, dizziness, pain, fever, nosebleed, blood in urine/stool)     No  Protocols used: BRUISES-A-AH

## 2018-02-10 ENCOUNTER — Other Ambulatory Visit: Payer: Self-pay | Admitting: Family Medicine

## 2018-03-07 ENCOUNTER — Telehealth: Payer: Self-pay | Admitting: Family Medicine

## 2018-03-07 NOTE — Telephone Encounter (Signed)
Copied from [place] [id number]. Topic: Quick Communication - Rx Refill/Question >> Mar 07, 2018  5:02 PM Oliver Pila B wrote: Pt's daughter called and states the pharmacy has tried to contact the office about some missing information(not disclosed) that is needing in order to have this medication filled; [facility] to have this handled  Medication: finasteride (PROSCAR) 5 MG tablet [[id number]]    Has the patient contacted their pharmacy? Yes.     (Agent: If no, request that the patient contact the pharmacy for the refill.)   Preferred Pharmacy (with phone number [facility]   Agent: Please be advised that RX refills may take up to 3 business days. We ask that you follow-up with your pharmacy.

## 2018-03-10 MED ORDER — FINASTERIDE 5 MG PO TABS: 5.0000 mg | ORAL_TABLET | Freq: Every day | ORAL | 1 refills | Status: DC

## 2018-03-19 ENCOUNTER — Telehealth: Payer: Self-pay | Admitting: Family Medicine

## 2018-03-19 NOTE — Telephone Encounter (Signed)
Copied from [place] [id number]. Topic: Quick Communication - See Telephone Encounter >> Mar 19, 2018  9:25 AM Boyd Kerbs wrote: CRM for notification.   [name], daughter is wanting to speak to Dr. Nani Ravens before dad's appt. tomorrow  See Telephone encounter for: 03/19/18.

## 2018-03-19 NOTE — Telephone Encounter (Signed)
Patients appt is tomorrow at 4 pm. The daughter stated her Dad is doing well 2 new meds added in the hospital--venlafaxine and seroquel are working well for him---daughter states he is taking all meds. Well. Right hand is shaking more than left. She cooks his meals and knows he is eating. IS there something else to add for the shaking--- but is it a side effect from the new meds/or age??? But if more meds are added may make too sleepy Patient is a little short with her daughter--prior to the hospital his mood had definitely changed. She does think the 2 new meds has helped.  The other issue from the daughter is she DOES NOT want him to drive!!!!!!! He had previously been getting lost and being shakey she thinks not a good thing.  The daughter has his keys, but car is still in the driveway.  She would like you to discuss with him. Patient not cooking his own breakfast and he use to. STRESS to him he needs a warm breakfast!!!!!! Also gives him something to do by cooking his breakfast. The daughter would like a call from you if needed   .

## 2018-03-20 ENCOUNTER — Ambulatory Visit (INDEPENDENT_AMBULATORY_CARE_PROVIDER_SITE_OTHER): Payer: Medicare Other | Admitting: Family Medicine

## 2018-03-20 ENCOUNTER — Encounter: Payer: Self-pay | Admitting: Family Medicine

## 2018-03-20 VITALS — BP 140/68 | HR 69 | Temp 98.7°F | Ht 70.0 in | Wt 184.2 lb

## 2018-03-20 DIAGNOSIS — R443 Hallucinations, unspecified: Secondary | ICD-10-CM | POA: Insufficient documentation

## 2018-03-20 DIAGNOSIS — R441 Visual hallucinations: Secondary | ICD-10-CM | POA: Insufficient documentation

## 2018-03-20 DIAGNOSIS — G252 Other specified forms of tremor: Secondary | ICD-10-CM

## 2018-03-20 HISTORY — DX: Other specified forms of tremor: G25.2

## 2018-03-20 HISTORY — DX: Visual hallucinations: R44.1

## 2018-03-20 MED ORDER — PROPRANOLOL HCL ER 60 MG PO CP24: 60.0000 mg | ORAL_CAPSULE | Freq: Every day | ORAL | 1 refills | Status: DC

## 2018-03-20 MED FILL — PROPRANOLOL ER 60 MG CAP: 60 | 30 days supply | Qty: 30 | Fill #0

## 2018-03-20 NOTE — Progress Notes (Signed)
Chief Complaint  Patient presents with  . Follow-up    Subjective: Patient is a 84 y.o. male here for tremor. Here with daughter.  Since d/c from hospital, has been having tremor in both hands.  He is fine while at rest but when he eats mainly, he notices shaking.  It is worse in his right hand which is his dominant hand.  He is not having any numbness, tingling, or weakness.  His daughter also notes that his hallucinations have gotten better since starting Seroquel and Effexor.  He has pictures in his house that he will notice sometimes smile at him.  They do not talk to him or give him instructions.  ROS: Heart: Denies chest pain  Lungs: Denies SOB   Family History  Problem Relation Age of Onset  . Depression Mother   . Alcohol abuse Father   . Alcohol abuse Brother    Past Medical History:  Diagnosis Date  . Diabetes mellitus   . GERD (gastroesophageal reflux disease)   . Heart murmur   . Hyperlipidemia   . Hypertension   . Personal history of colonic polyps   . Stroke ([place]) 03/2011   R lat thalamus, post in int capsule  . Thyroid disease    Allergies  Allergen Reactions  . Clonidine Hydrochloride     REACTION: dry mouth  . Prednisone     REACTION: Hair loss  . Verapamil     REACTION: hallucinations    Current Outpatient Medications:  .  alfuzosin (UROXATRAL) 10 MG 24 hr tablet, TAKE 1 TABLET DAILY, Disp: 90 tablet, Rfl: 1 .  donepezil (ARICEPT) 10 MG tablet, Take 1 tablet (10 mg total) by mouth at bedtime., Disp: 90 tablet, Rfl: 1 .  ferrous sulfate 325 (65 FE) MG tablet, Take 1 tablet by mouth 2 (two) times daily., Disp: , Rfl:  .  finasteride (PROSCAR) 5 MG tablet, Take 1 tablet (5 mg total) by mouth daily., Disp: 90 tablet, Rfl: 1 .  levothyroxine (SYNTHROID, LEVOTHROID) 75 MCG tablet, TAKE 1 TABLET DAILY, Disp: 90 tablet, Rfl: 0 .  omeprazole (PRILOSEC) 40 MG capsule, TAKE 1 CAPSULE DAILY, Disp: 90 capsule, Rfl: 3 .  pravastatin (PRAVACHOL) 40 MG tablet, TAKE  1 TABLET DAILY (REPEAT LABS ARE DUE NOW), Disp: 90 tablet, Rfl: 0 .  QUEtiapine (SEROQUEL) 100 MG tablet, Take 1 tablet (100 mg total) by mouth at bedtime., Disp: 90 tablet, Rfl: 3 .  venlafaxine XR (EFFEXOR XR) 75 MG 24 hr capsule, Take 1 capsule (75 mg total) by mouth daily with breakfast., Disp: 90 capsule, Rfl: 3 .  vitamin B-12 (CYANOCOBALAMIN) 1000 MCG tablet, Take 1 tablet (1,000 mcg total) by mouth daily., Disp: 30 tablet, Rfl: 1 .  propranolol ER (INDERAL LA) 60 MG 24 hr capsule, Take 1 capsule (60 mg total) by mouth daily., Disp: 30 capsule, Rfl: 1  Objective: BP 140/68 (BP Location: Left Arm, Patient Position: Sitting, Cuff Size: Normal)   Pulse 69   Temp 98.7 F (37.1 C) (Oral)   Ht 5\' 10"  (1.778 m)   Wt 184 lb 4 oz (83.6 kg)   SpO2 97%   BMI 26.44 kg/m  General: Awake, appears stated age HEENT: MMM, EOMi Heart: RRR, no murmurs Lungs: CTAB, no rales, wheezes or rhonchi. No accessory muscle use Abd: BS+, soft, NT, ND, no masses or organomegaly Psych: Age appropriate judgment and insight, normal affect and mood  Assessment and Plan: Intention tremor - Plan: propranolol ER (INDERAL LA) 60 MG 24  hr capsule  Hallucination  Orders as above.  Try propanolol for the intention tremor, treating underlying benign essential tremor.  I do not think this is a Parkinson's type tremor and thus less likely to be caused by the medication. I am okay with his current level of visual hallucination.  If he started having auditory hallucination or felt that he was receiving instructions from the pictures, I believe we would need to tailor his treatment accordingly. I would like to see him again in 4 weeks to recheck his tremor. The patient and his daughter voiced understanding and agreement to the plan.  Greater than 25 minutes were spent face to face with the patient with greater than 50% of this time spent counseling on tremor, treatment, hallucinations, AE's of medication, and driving.    [place], DO 03/20/18  5:07 PM

## 2018-03-20 NOTE — Progress Notes (Signed)
Pre visit review using our clinic review tool, if applicable. No additional management support is needed unless otherwise documented below in the visit note. 

## 2018-03-20 NOTE — Patient Instructions (Signed)
Tremor A tremor is trembling or shaking that you cannot control. Most tremors affect the hands or arms. Tremors can also affect the head, vocal cords, face, and other parts of the body. There are many types of tremors. Common types include:  Essential tremor. These usually occur in people over the age of 40. It may run in families and can happen in otherwise healthy people.  Resting tremor. These occur when the muscles are at rest, such as when your hands are resting in your lap. People with Parkinson disease often have resting tremors.  Postural tremor. These occur when you try to hold a pose, such as keeping your hands outstretched.  Kinetic tremor. These occur during purposeful movement, such as trying to touch a finger to your nose.  Task-specific tremor. These may occur when you perform tasks such as handwriting, speaking, or standing.  Psychogenic tremor. These dramatically lessen or disappear when you are distracted. They can happen in people of all ages.  Some types of tremors have no known cause. Tremors can also be a symptom of nervous system problems (neurological disorders) that may occur with aging. Some tremors go away with treatment while others do not. Follow these instructions at home: Watch your tremor for any changes. The following actions may help to lessen any discomfort you are feeling:  Take medicines only as directed by your health care provider.  Limit alcohol intake to no more than 1 drink per day for nonpregnant women and 2 drinks per day for men. One drink equals 12 oz of beer, 5 oz of wine, or 1 oz of hard liquor.  Do not use any tobacco products, including cigarettes, chewing tobacco, or electronic cigarettes. If you need help quitting, ask your health care provider.  Avoid extreme heat or cold.  Limit the amount of caffeine you consumeas directed by your health care provider.  Try to get 8 hours of sleep each night.  Find ways to manage your stress,  such as meditation or yoga.  Keep all follow-up visits as directed by your health care provider. This is important.  Contact a health care provider if:  You start having a tremor after starting a new medicine.  You have tremor with other symptoms such as: ? Numbness. ? Tingling. ? Pain. ? Weakness.  Your tremor gets worse.  Your tremor interferes with your day-to-day life. This information is not intended to replace advice given to you by your health care provider. Make sure you discuss any questions you have with your health care provider. Document Released: 11/30/2002 Document Revised: 08/12/2016 Document Reviewed: 06/07/2014 Elsevier Interactive Patient Education  2018 Elsevier Inc.  

## 2018-04-03 ENCOUNTER — Telehealth: Payer: Self-pay | Admitting: Family Medicine

## 2018-04-03 DIAGNOSIS — G252 Other specified forms of tremor: Secondary | ICD-10-CM

## 2018-04-03 MED ORDER — PROPRANOLOL HCL ER 60 MG PO CP24: 60.0000 mg | ORAL_CAPSULE | Freq: Every day | ORAL | 1 refills | Status: DC

## 2018-04-03 NOTE — Telephone Encounter (Signed)
Attempted to call patient's daughter, [name], to see how he was doing on medication- before sending 90 day supply into pharmacy as this was a new Rx to help treat tremors. Mailbox full- no answer.  Request for 90 day supply sent to office- patient has follow up appointment to assess response in May.

## 2018-04-03 NOTE — Telephone Encounter (Signed)
[facility]/called the daughter on the cell--no answer/mailbox was full.  Called the home number left a detailed message refill done.

## 2018-04-03 NOTE — Telephone Encounter (Signed)
Copied from [place] [id number]. Topic: Quick Communication - Rx Refill/Question >> Apr 03, 2018 11:13 AM Boyd Kerbs wrote: Medication: propranolol ER (INDERAL LA) 60 MG 24 hr capsule  They are asking to [facility] as [facility] takes time to get them  Has the patient contacted their pharmacy? No. (Agent: If no, request that the patient contact the pharmacy for the refill.) Preferred Pharmacy (with phone number or street name):   [facility] [address] [address], [place] [address] [place] [address] Phone: [phone] Fax: [phone]  Please call daughter, [name] and let know when sent in  Agent: Please be advised that RX refills may take up to 3 business days. We ask that you follow-up with your pharmacy.

## 2018-04-03 NOTE — Telephone Encounter (Signed)
Copied from [place] [id number]. Topic: Quick Communication - Rx Refill/Question >> Apr 03, 2018 11:13 AM Boyd Kerbs wrote: Medication: propranolol ER (INDERAL LA) 60 MG 24 hr capsule  They are asking to [facility] a [facility] day [facility] as it takes time to get them  Has the patient contacted their pharmacy? No. (Agent: If no, request that the patient contact the pharmacy for the refill.) Preferred Pharmacy (with phone number or street name):   [facility] - [address], [place] [address] Phone: [phone] Fax: [phone]   Daughter, [name],  is asking for call back so she knows it was done.    Agent: Please be advised that RX refills may take up to 3 business days. We ask that you follow-up with your pharmacy.

## 2018-04-03 NOTE — Telephone Encounter (Signed)
Requesting Inderal LA refills be sent to [facility]. Explained to daughter, [name], he still has a refill at [facility]. She wants to get further refills from [place]. Please call pt./family when this is done.

## 2018-04-15 ENCOUNTER — Ambulatory Visit: Payer: Medicare Other | Admitting: Podiatry

## 2018-04-16 ENCOUNTER — Ambulatory Visit: Payer: Medicare Other | Admitting: Podiatry

## 2018-04-25 ENCOUNTER — Ambulatory Visit: Payer: Medicare Other | Admitting: Family Medicine

## 2018-05-01 ENCOUNTER — Encounter: Payer: Self-pay | Admitting: Family Medicine

## 2018-05-01 ENCOUNTER — Ambulatory Visit (INDEPENDENT_AMBULATORY_CARE_PROVIDER_SITE_OTHER): Payer: Medicare Other | Admitting: Family Medicine

## 2018-05-01 VITALS — BP 140/72 | HR 59 | Temp 98.4°F | Ht 70.0 in | Wt 184.4 lb

## 2018-05-01 DIAGNOSIS — J302 Other seasonal allergic rhinitis: Secondary | ICD-10-CM | POA: Insufficient documentation

## 2018-05-01 DIAGNOSIS — M79671 Pain in right foot: Secondary | ICD-10-CM

## 2018-05-01 DIAGNOSIS — G252 Other specified forms of tremor: Secondary | ICD-10-CM | POA: Diagnosis not present

## 2018-05-01 HISTORY — DX: Other seasonal allergic rhinitis: J30.2

## 2018-05-01 MED ORDER — CETIRIZINE HCL 10 MG PO TABS: 10.0000 mg | ORAL_TABLET | Freq: Every day | ORAL | 3 refills | Status: DC

## 2018-05-01 MED ORDER — FLUTICASONE PROPIONATE 50 MCG/ACT NA SUSP: 2.0000 | Freq: Every day | NASAL | 2 refills | Status: DC

## 2018-05-01 MED FILL — FLUTICASONE PROP 50 MCG SPR: 50 | 30 days supply | Qty: 16 | Fill #0

## 2018-05-01 NOTE — Progress Notes (Signed)
Chief Complaint  Patient presents with  . Follow-up    right foot pain at night    Subjective: Patient is a 84 y.o. male here for f/u tremor. Here with daughter.  Pt dx'd with intention tremor and placed on propranolol 60 mg XR/d. Doing markedly better, tolerating well. Still a little shaking when eating, but tolerable. Does not wish to change dose.   He also complains of a 2-week history of right foot pain.  Bothers him only at night.  He takes [phone] g of Tylenol and it resolves within 1 to 2 minutes.  He denies any swelling, bruising, injury, change in activity, numbness, tingling, or weakness.  It does not bother him when he walks.  He also has a history of allergies.  He currently takes Zyrtec daily.  He tolerates this medicine well, however he still has a runny nose.  He is wondering if there is anything else he can take.  Denies any fevers.  ROS: Neuro: As noted in HPI HEENT: +rhinorrhea   Past Medical History:  Diagnosis Date  . Diabetes mellitus   . GERD (gastroesophageal reflux disease)   . Heart murmur   . Hyperlipidemia   . Hypertension   . Personal history of colonic polyps   . Stroke ([place]) 03/2011   R lat thalamus, post in int capsule  . Thyroid disease    Family History  Problem Relation Age of Onset  . Depression Mother   . Alcohol abuse Father   . Alcohol abuse Brother    Allergies as of 05/01/2018      Reactions   Clonidine Hydrochloride    REACTION: dry mouth   Prednisone    REACTION: Hair loss   Verapamil    REACTION: hallucinations      Medication List        Accurate as of 05/01/18  4:43 PM. Always use your most recent med list.          alfuzosin 10 MG 24 hr tablet Commonly known as:  UROXATRAL TAKE 1 TABLET DAILY   cetirizine 10 MG tablet Commonly known as:  ZYRTEC Take 1 tablet (10 mg total) by mouth daily.   donepezil 10 MG tablet Commonly known as:  ARICEPT Take 1 tablet (10 mg total) by mouth at bedtime.   ferrous sulfate 325  (65 FE) MG tablet Take 1 tablet by mouth 2 (two) times daily.   finasteride 5 MG tablet Commonly known as:  PROSCAR Take 1 tablet (5 mg total) by mouth daily.   fluticasone 50 MCG/ACT nasal spray Commonly known as:  FLONASE ALLERGY RELIEF Place 2 sprays into both nostrils daily.   levothyroxine 75 MCG tablet Commonly known as:  SYNTHROID, LEVOTHROID TAKE 1 TABLET DAILY   omeprazole 40 MG capsule Commonly known as:  PRILOSEC TAKE 1 CAPSULE DAILY   pravastatin 40 MG tablet Commonly known as:  PRAVACHOL TAKE 1 TABLET DAILY (REPEAT LABS ARE DUE NOW)   propranolol ER 60 MG 24 hr capsule Commonly known as:  INDERAL LA Take 1 capsule (60 mg total) by mouth daily.   QUEtiapine 100 MG tablet Commonly known as:  SEROQUEL Take 1 tablet (100 mg total) by mouth at bedtime.   venlafaxine XR 75 MG 24 hr capsule Commonly known as:  EFFEXOR XR Take 1 capsule (75 mg total) by mouth daily with breakfast.   vitamin B-12 1000 MCG tablet Commonly known as:  CYANOCOBALAMIN Take 1 tablet (1,000 mcg total) by mouth daily.  Objective: BP 140/72 (BP Location: Left Arm, Patient Position: Sitting, Cuff Size: Normal)   Pulse (!) 59   Temp 98.4 F (36.9 C) (Oral)   Ht 5\' 10"  (1.778 m)   Wt 184 lb 6 oz (83.6 kg)   SpO2 98%   BMI 26.46 kg/m  General: Awake, appears stated age Heart: Brisk cap refill Lungs: No accessory muscle use Neuro: No resting or intention tremor noted, sensation intact to light touch over R foot MSK: +TTP over great toe extensor tendon on R over distal MT Psych: limited judgment and insight, normal affect and mood  Assessment and Plan: Intention tremor  Right foot pain  Seasonal allergies - Plan: fluticasone (FLONASE ALLERGY RELIEF) 50 MCG/ACT nasal spray, cetirizine (ZYRTEC) 10 MG tablet  Cont BB at current dose. Tylenol, cont supportive care. Cont PO antihistamine, start INCS.  F/u in 6 mo for med check. The patient and daughter voiced understanding  and agreement to the plan.  Bandana, DO 05/01/18  4:43 PM

## 2018-05-01 NOTE — Patient Instructions (Signed)
I wouldn't do anything for the foot. Keep taking Tylenol as needed. Let us know if things get worse.  Continue the propranolol for tremors.  Flonase (fluticasone); nasal spray that is over the counter. 2 sprays each nostril, once daily. Aim towards the same side eye when you spray.  Let us know if you need anything.

## 2018-05-12 ENCOUNTER — Other Ambulatory Visit: Payer: Self-pay | Admitting: Family Medicine

## 2018-05-30 ENCOUNTER — Other Ambulatory Visit: Payer: Self-pay | Admitting: Family Medicine

## 2018-05-30 DIAGNOSIS — F0390 Unspecified dementia without behavioral disturbance: Secondary | ICD-10-CM

## 2018-06-05 ENCOUNTER — Other Ambulatory Visit: Payer: Self-pay | Admitting: Family Medicine

## 2018-06-05 MED ORDER — OMEPRAZOLE 40 MG PO CPDR: 40.0000 mg | DELAYED_RELEASE_CAPSULE | Freq: Every day | ORAL | 3 refills | Status: DC

## 2018-06-05 MED ORDER — LEVOTHYROXINE SODIUM 75 MCG PO TABS: 75.0000 ug | ORAL_TABLET | Freq: Every day | ORAL | 0 refills | Status: DC

## 2018-06-05 MED ORDER — ALFUZOSIN HCL ER 10 MG PO TB24: 10.0000 mg | ORAL_TABLET | Freq: Every day | ORAL | 1 refills | Status: DC

## 2018-07-17 ENCOUNTER — Ambulatory Visit: Payer: Medicare Other | Admitting: Podiatry

## 2018-07-24 ENCOUNTER — Ambulatory Visit (INDEPENDENT_AMBULATORY_CARE_PROVIDER_SITE_OTHER): Payer: Medicare Other | Admitting: Podiatry

## 2018-07-24 DIAGNOSIS — M79671 Pain in right foot: Secondary | ICD-10-CM

## 2018-07-24 DIAGNOSIS — L600 Ingrowing nail: Secondary | ICD-10-CM | POA: Diagnosis not present

## 2018-07-24 DIAGNOSIS — B351 Tinea unguium: Secondary | ICD-10-CM

## 2018-07-24 DIAGNOSIS — M79672 Pain in left foot: Secondary | ICD-10-CM

## 2018-07-24 NOTE — Patient Instructions (Signed)
Seen for hypertrophic nails. All nails debrided. Return in 3 months or as needed.  

## 2018-07-28 ENCOUNTER — Encounter: Payer: Self-pay | Admitting: Podiatry

## 2018-07-28 NOTE — Progress Notes (Signed)
Subjective: 84 y.o. year old male patient presents complaining of painful nails. Patient requests toe nails trimmed.   Objective: Dermatologic: Thick yellow deformed nails x 10. Hallucal nails are mildly ingrown. No abnormal other skin lesions noted. Vascular: Pedal pulses are all palpable. Orthopedic: No gross deformities noted. Neurologic: All epicritic and tactile sensations grossly intact.  Assessment: Dystrophic mycotic nails x 10. Pain in both feet.  Treatment: All mycotic nails debrided.  Return in 3 months or as needed.

## 2018-08-11 ENCOUNTER — Other Ambulatory Visit: Payer: Self-pay | Admitting: Family Medicine

## 2018-08-31 ENCOUNTER — Other Ambulatory Visit: Payer: Self-pay

## 2018-08-31 ENCOUNTER — Emergency Department (HOSPITAL_BASED_OUTPATIENT_CLINIC_OR_DEPARTMENT_OTHER)
Admission: EM | Admit: 2018-08-31 | Discharge: 2018-08-31 | Disposition: A | Discharge status: Home / Self Care | Payer: Medicare Other | Attending: Emergency Medicine | Admitting: Emergency Medicine

## 2018-08-31 ENCOUNTER — Encounter (HOSPITAL_BASED_OUTPATIENT_CLINIC_OR_DEPARTMENT_OTHER): Payer: Self-pay | Admitting: Adult Health

## 2018-08-31 DIAGNOSIS — E119 Type 2 diabetes mellitus without complications: Secondary | ICD-10-CM | POA: Diagnosis not present

## 2018-08-31 DIAGNOSIS — Z87891 Personal history of nicotine dependence: Secondary | ICD-10-CM | POA: Diagnosis not present

## 2018-08-31 DIAGNOSIS — I10 Essential (primary) hypertension: Secondary | ICD-10-CM | POA: Insufficient documentation

## 2018-08-31 DIAGNOSIS — R32 Unspecified urinary incontinence: Secondary | ICD-10-CM | POA: Diagnosis present

## 2018-08-31 DIAGNOSIS — Z8673 Personal history of transient ischemic attack (TIA), and cerebral infarction without residual deficits: Secondary | ICD-10-CM | POA: Insufficient documentation

## 2018-08-31 DIAGNOSIS — N471 Phimosis: Secondary | ICD-10-CM | POA: Insufficient documentation

## 2018-08-31 LAB — CBC
HCT: 38.9 % — ABNORMAL LOW (ref 39.0–52.0)
HEMOGLOBIN: 12.6 g/dL — AB (ref 13.0–17.0)
MCH: 30.8 pg (ref 26.0–34.0)
MCHC: 32.4 g/dL (ref 30.0–36.0)
MCV: 95.1 fL (ref 78.0–100.0)
Platelets: 214 10*3/uL (ref 150–400)
RBC: 4.09 MIL/uL — ABNORMAL LOW (ref 4.22–5.81)
RDW: 13 % (ref 11.5–15.5)
WBC: 7.1 10*3/uL (ref 4.0–10.5)

## 2018-08-31 LAB — URINALYSIS, ROUTINE W REFLEX MICROSCOPIC
Bilirubin Urine: NEGATIVE
Glucose, UA: NEGATIVE mg/dL
KETONES UR: NEGATIVE mg/dL
LEUKOCYTES UA: NEGATIVE
NITRITE: NEGATIVE
PROTEIN: 30 mg/dL — AB
Specific Gravity, Urine: 1.025 (ref 1.005–1.030)
pH: 5.5 (ref 5.0–8.0)

## 2018-08-31 LAB — COMPREHENSIVE METABOLIC PANEL
ALBUMIN: 3.7 g/dL (ref 3.5–5.0)
ALK PHOS: 37 U/L — AB (ref 38–126)
ALT: 13 U/L (ref 0–44)
ANION GAP: 5 (ref 5–15)
AST: 17 U/L (ref 15–41)
BILIRUBIN TOTAL: 0.7 mg/dL (ref 0.3–1.2)
BUN: 22 mg/dL (ref 8–23)
CALCIUM: 8.5 mg/dL — AB (ref 8.9–10.3)
CO2: 33 mmol/L — ABNORMAL HIGH (ref 22–32)
Chloride: 99 mmol/L (ref 98–111)
Creatinine, Ser: 1.03 mg/dL (ref 0.61–1.24)
GFR calc Af Amer: >60 mL/min (ref >60)
GFR calc non Af Amer: >60 mL/min (ref >60)
GLUCOSE: 99 mg/dL (ref 70–99)
Potassium: 4.7 mmol/L (ref 3.5–5.1)
Sodium: 137 mmol/L (ref 135–145)
TOTAL PROTEIN: 6.8 g/dL (ref 6.5–8.1)

## 2018-08-31 LAB — URINALYSIS, MICROSCOPIC (REFLEX): WBC UA: NONE SEEN WBC/hpf (ref 0–5)

## 2018-08-31 NOTE — ED Provider Notes (Addendum)
[facility] Emergency Department Provider Note MRN:  [id number]  Arrival date & time: 08/31/18     Chief Complaint   Dysuria   History of Present Illness   Douglas Grant is a 84 y.o. year-old male with a history of diabetes presenting to the ED with chief complaint of trouble with urination.  Issues have been present for 1 to 2 months.  Trouble with getting the urine out at times, also having episodes of unintentional urinary incontinence.  Has been intentionally drinking less fluid to avoid such issues.  Family found out about this condition today, prompting ED visit.  Upon further questioning, it is revealed that the patient's foreskin is getting in the way, unable to retract, causing to have trouble urinating.  Recent hematuria, no fevers, no abdominal pain, no chest pain, no shortness of breath.  Review of Systems  A complete 10 system review of systems was obtained and all systems are negative except as noted in the HPI and PMH.   Patient's Health History    Past Medical History:  Diagnosis Date  . Diabetes mellitus   . GERD (gastroesophageal reflux disease)   . Heart murmur   . Hyperlipidemia   . Hypertension   . Personal history of colonic polyps   . Stroke ([place]) 03/2011   R lat thalamus, post in int capsule  . Thyroid disease     Past Surgical History:  Procedure Laterality Date  . APPENDECTOMY      Family History  Problem Relation Age of Onset  . Depression Mother   . Alcohol abuse Father   . Alcohol abuse Brother     Social History   Socioeconomic History  . Marital status: Widowed    Spouse name: Not on file  . Number of children: Not on file  . Years of education: Not on file  . Highest education level: Not on file  Occupational History  . Not on file  Social Needs  . Financial resource strain: Not on file  . Food insecurity:    Worry: Not on file    Inability: Not on file  . Transportation needs:    Medical: Not on  file    Non-medical: Not on file  Tobacco Use  . Smoking status: Former Smoker    Last attempt to quit: 04/18/1985    Years since quitting: 33.3  . Smokeless tobacco: Never Used  Substance and Sexual Activity  . Alcohol use: No    Alcohol/week: 0.0 standard drinks  . Drug use: No  . Sexual activity: Never  Lifestyle  . Physical activity:    Days per week: Not on file    Minutes per session: Not on file  . Stress: Not on file  Relationships  . Social connections:    Talks on phone: Not on file    Gets together: Not on file    Attends religious service: Not on file    Active member of club or organization: Not on file    Attends meetings of clubs or organizations: Not on file    Relationship status: Not on file  . Intimate partner violence:    Fear of current or ex partner: Not on file    Emotionally abused: Not on file    Physically abused: Not on file    Forced sexual activity: Not on file  Other Topics Concern  . Not on file  Social History Narrative  . Not on file  Physical Exam  Vital Signs and Nursing Notes reviewed Vitals:   08/31/18 1403 08/31/18 1453  BP: (!) 176/67 (!) 171/64  Pulse: (!) 58 (!) 55  Resp: 16 18  Temp:  98.6 F (37 C)  SpO2: 98% 96%    CONSTITUTIONAL: Well-appearing, NAD NEURO:  Alert and oriented x 3, no focal deficits EYES:  eyes equal and reactive ENT/NECK:  no LAD, no JVD CARDIO: Regular rate, well-perfused, normal S1 and S2 PULM:  CTAB no wheezing or rhonchi GI/GU:  normal bowel sounds, non-distended, non-tender, tender, mildly swollen foreskin, unable to retract and expose the glans, no significant erythema MSK/SPINE:  No gross deformities, no edema SKIN:  no rash, atraumatic PSYCH:  Appropriate speech and behavior  Diagnostic and Interventional Summary    EKG Interpretation  Date/Time:    Ventricular Rate:    PR Interval:    QRS Duration:   QT Interval:    QTC Calculation:   R Axis:     Text Interpretation:         Labs Reviewed  URINALYSIS, ROUTINE W REFLEX MICROSCOPIC - Abnormal; Notable for the following components:      Result Value   Hgb urine dipstick SMALL (*)    Protein, ur 30 (*)    All other components within normal limits  COMPREHENSIVE METABOLIC PANEL - Abnormal; Notable for the following components:   CO2 33 (*)    Calcium 8.5 (*)    Alkaline Phosphatase 37 (*)    All other components within normal limits  CBC - Abnormal; Notable for the following components:   RBC 4.09 (*)    Hemoglobin 12.6 (*)    HCT 38.9 (*)    All other components within normal limits  URINALYSIS, MICROSCOPIC (REFLEX) - Abnormal; Notable for the following components:   Bacteria, UA FEW (*)    All other components within normal limits    No orders to display    Medications - No data to display   Procedures Critical Care  ED Course and Medical Decision Making  I have reviewed the triage vital signs and the nursing notes.  Pertinent labs & imaging results that were available during my care of the patient were reviewed by me and considered in my medical decision making (see below for details).    Concern for phimosis in this 84 year old uncircumcised male, month-long issue recently found up by family.  Will likely need urological follow-up, question if this needs to be done today or can wait and be done as an outpatient.  Will obtain screening labs to ensure normal kidney function, will attempt to obtain urinalysis to exclude UTI.  Will likely discuss with urology for recommendations.  UA with no evidence of infection, labs unremarkable, no AKI.  Discussed case with Dr. Lovena Neighbours of urology, who agrees with the diagnosis of phimosis, and the ability to manage this as an outpatient.  Provided patient with contact information for [facility].  After the discussed management above, the patient was determined to be safe for discharge.  The patient was in agreement with this plan and all questions  regarding their care were answered.  ED return precautions were discussed and the patient will return to the ED with any significant worsening of condition.  Barth Kirks. [place]onia Small, [place] [email or web address]  Final Clinical Impressions(s) / ED Diagnoses     ICD-10-CM   1. Phimosis N47.1     ED Discharge Orders    None  Maudie Flakes, MD 08/31/18 1520    Maudie Flakes, MD 08/31/18 1520

## 2018-08-31 NOTE — Discharge Instructions (Addendum)
You were evaluated in the Emergency Department and after careful evaluation, we did not find any emergent condition requiring admission or further testing in the hospital.  Your symptoms today seem to be due to a phimosis.  Please make an appointment with [facility].  They are located on [address] in [place], [place].  Their office number is [phone].  I spoke with Dr. Ellison Hughs, they should be expecting your call to schedule an appointment.  Please return to the Emergency Department if you experience any worsening of your condition.  We encourage you to follow up with a primary care provider.  Thank you for allowing [place] to be a part of your care.

## 2018-08-31 NOTE — ED Triage Notes (Signed)
Presents with pain with urination, hematuria and incontinence. Per family, he is c/o penile pain and today he has difficulty urinating.

## 2018-08-31 NOTE — ED Notes (Signed)
ED Provider at bedside. 

## 2018-08-31 NOTE — ED Notes (Signed)
Patient left at this time with all belongings. 

## 2018-09-01 DIAGNOSIS — N471 Phimosis: Secondary | ICD-10-CM | POA: Diagnosis not present

## 2018-09-01 DIAGNOSIS — N476 Balanoposthitis: Secondary | ICD-10-CM | POA: Diagnosis not present

## 2018-09-04 ENCOUNTER — Other Ambulatory Visit: Payer: Self-pay | Admitting: Urology

## 2018-09-12 ENCOUNTER — Other Ambulatory Visit: Payer: Self-pay | Admitting: Family Medicine

## 2018-09-12 DIAGNOSIS — G252 Other specified forms of tremor: Secondary | ICD-10-CM

## 2018-09-18 ENCOUNTER — Other Ambulatory Visit: Payer: Self-pay | Admitting: Family Medicine

## 2018-09-26 ENCOUNTER — Encounter (HOSPITAL_COMMUNITY): Payer: Self-pay

## 2018-09-26 ENCOUNTER — Encounter (HOSPITAL_COMMUNITY)
Admission: RE | Admit: 2018-09-26 | Discharge: 2018-09-26 | Disposition: A | Discharge status: Home / Self Care | Payer: Medicare Other | Source: Ambulatory Visit | Attending: Urology | Admitting: Urology

## 2018-09-26 ENCOUNTER — Other Ambulatory Visit: Payer: Self-pay

## 2018-09-26 DIAGNOSIS — Z01812 Encounter for preprocedural laboratory examination: Secondary | ICD-10-CM | POA: Diagnosis not present

## 2018-09-26 HISTORY — DX: Unspecified osteoarthritis, unspecified site: M19.90

## 2018-09-26 HISTORY — DX: Hypothyroidism, unspecified: E03.9

## 2018-09-26 HISTORY — DX: Dyspnea, unspecified: R06.00

## 2018-09-26 HISTORY — DX: Benign prostatic hyperplasia with lower urinary tract symptoms: N40.1

## 2018-09-26 HISTORY — DX: Other forms of dyspnea: R06.09

## 2018-09-26 HISTORY — DX: Other amnesia: R41.3

## 2018-09-26 HISTORY — DX: Type 2 diabetes mellitus without complications: E11.9

## 2018-09-26 HISTORY — DX: Personal history of transient ischemic attack (TIA), and cerebral infarction without residual deficits: Z86.73

## 2018-09-26 HISTORY — DX: Unspecified abnormalities of gait and mobility: R26.9

## 2018-09-26 HISTORY — DX: Complete loss of teeth, unspecified cause, unspecified class: K08.109

## 2018-09-26 HISTORY — DX: Iron deficiency anemia, unspecified: D50.9

## 2018-09-26 HISTORY — DX: Presence of dental prosthetic device (complete) (partial): Z97.2

## 2018-09-26 LAB — BASIC METABOLIC PANEL
Anion gap: 6 (ref 5–15)
BUN: 26 mg/dL — AB (ref 8–23)
CO2: 30 mmol/L (ref 22–32)
Calcium: 9.1 mg/dL (ref 8.9–10.3)
Chloride: 107 mmol/L (ref 98–111)
Creatinine, Ser: 1.27 mg/dL — ABNORMAL HIGH (ref 0.61–1.24)
GFR calc Af Amer: 57 mL/min — ABNORMAL LOW (ref >60)
GFR, EST NON AFRICAN AMERICAN: 49 mL/min — AB (ref >60)
GLUCOSE: 143 mg/dL — AB (ref 70–99)
Potassium: 4.9 mmol/L (ref 3.5–5.1)
Sodium: 143 mmol/L (ref 135–145)

## 2018-09-26 LAB — GLUCOSE, CAPILLARY: Glucose-Capillary: 181 mg/dL — ABNORMAL HIGH (ref 70–99)

## 2018-09-26 LAB — CBC
HCT: 40.6 % (ref 39.0–52.0)
Hemoglobin: 12.9 g/dL — ABNORMAL LOW (ref 13.0–17.0)
MCH: 30.1 pg (ref 26.0–34.0)
MCHC: 31.8 g/dL (ref 30.0–36.0)
MCV: 94.9 fL (ref 78.0–100.0)
PLATELETS: 241 10*3/uL (ref 150–400)
RBC: 4.28 MIL/uL (ref 4.22–5.81)
RDW: 13.1 % (ref 11.5–15.5)
WBC: 7.7 10*3/uL (ref 4.0–10.5)

## 2018-09-26 NOTE — Progress Notes (Signed)
09-26-18 BMP result routed to Dr. Jeffie Pollock for review.

## 2018-09-26 NOTE — Progress Notes (Signed)
EKG dated 10-31-2017 in epic.

## 2018-09-26 NOTE — Patient Instructions (Addendum)
Douglas Grant  [birth year]    Your procedure is scheduled on:  10-02-2018    Report to [facility] Main  Entrance,  Report to admitting at  7:15 AM    Call this number if you have problems the morning of surgery [phone]     Remember: Do not eat food or drink liquids :After Midnight.                          BRUSH YOUR TEETH MORNING OF SURGERY AND RINSE YOUR MOUTH OUT, NO CHEWING GUM CANDY OR MINTS.     Take these medicines the morning of surgery with A SIP OF WATER:  Venlafaxine(effexor), Zyrtec, Levothyroxine, Finasteride (proscar), Omeprazole (prilosec)                DO NOT TAKE ANY DIABETIC MEDICATIONS DAY OF YOUR SURGERY                               You may not have any metal on your body including hair pins and              piercings                Do not wear jewelry, make-up, lotions, powders or perfumes, deodorant                         Do not wear nail polish.                Do not shave  48 hours prior to surgery.               Do not bring valuables to the hospital. [place].  Contacts, dentures or bridgework may not be worn into surgery.  Leave suitcase in the car. After surgery it may be brought to your room.     Patients discharged the day of surgery will not be allowed to drive home.  Name and phone number of your driver:   Daughter-- Randye Lobo #[phone]            _____________________________________________________________________            [facility] - Preparing for Surgery Before surgery, you can play an important role.  Because skin is not sterile, your skin needs to be as free of germs as possible.  You can reduce the number of germs on your skin by washing with CHG (chlorahexidine gluconate) soap before surgery.  CHG is an antiseptic cleaner which kills germs and bonds with the skin to continue killing germs even after washing. Please DO NOT use if you  have an allergy to CHG or antibacterial soaps.  If your skin becomes reddened/irritated stop using the CHG and inform your nurse when you arrive at Short Stay. Do not shave (including legs and underarms) for at least 48 hours prior to the first CHG shower.  You may shave your face/neck. Please follow these instructions carefully:  1.  Shower with CHG Soap the night before surgery and the  morning of Surgery.  2.  If you choose to wash your hair, wash your hair first as usual with your  normal  shampoo.  3.  After you shampoo, rinse your hair and body thoroughly to remove the  shampoo.                            4.  Use CHG as you would any other liquid soap.  You can apply chg directly  to the skin and wash                       Gently with a scrungie or clean washcloth.  5.  Apply the CHG Soap to your body ONLY FROM THE NECK DOWN.   Do not use on face/ open                           Wound or open sores. Avoid contact with eyes, ears mouth and genitals (private parts).                       Wash face,  Genitals (private parts) with your normal soap.             6.  Wash thoroughly, paying special attention to the area where your surgery  will be performed.  7.  Thoroughly rinse your body with warm water from the neck down.  8.  DO NOT shower/wash with your normal soap after using and rinsing off  the CHG Soap.             9.  Pat yourself dry with a clean towel.            10.  Wear clean pajamas.            11.  Place clean sheets on your bed the night of your first shower and do not  sleep with pets. Day of Surgery : Do not apply any lotions/deodorants the morning of surgery.  Please wear clean clothes to the hospital/surgery center.  FAILURE TO FOLLOW THESE INSTRUCTIONS MAY RESULT IN THE CANCELLATION OF YOUR SURGERY PATIENT SIGNATURE_________________________________  NURSE  SIGNATURE__________________________________  ________________________________________________________________________

## 2018-09-27 LAB — HEMOGLOBIN A1C
HEMOGLOBIN A1C: 6 % — AB (ref 4.8–5.6)
Mean Plasma Glucose: 126 mg/dL

## 2018-10-01 NOTE — H&P (Signed)
CC/HPI: I have trouble rolling my foreskin back.     [name] is an 84 yo WM who is sent by [facility] for phimosis with balanitis. This has been chronic and was commented on by Dr. Diona Fanti at his last visit in 2010. He is having difficulty retracting the skin to void. He has no fever. He is otherwise voiding without complaints. He has some incontinence with urgency. he is chronically on finasteride and alfuzosin for BPH with BOO. He has no other associated signs or symptoms. He has not had treatment for the phimosis. His IPSS is 10. His UA is clear.     CC: AUA Questions Scoring.  HPI: Douglas Grant is a 84 year-old male patient who was referred by Dr. Roma Schanz, DO who is here for evaluation.      AUA Symptom Score: Less than 20% of the time he has the sensation of not emptying his bladder completely when finished urinating. 50% of the time he has to urinate again fewer than two hours after he has finished urinating. Less than 20% of the time he has to start and stop again several times when he urinates. More than 50% of the time he finds it difficult to postpone urination. He never has a weak urinary stream. Less than 20% of the time he has to push or strain to begin urination. He never has to get up to urinate from the time he goes to bed until the time he gets up in the morning.   Calculated AUA Symptom Score: 10    ALLERGIES: clonidine PredniSONE TABS Verapamil    MEDICATIONS: Finasteride 5 mg tablet  Levothyroxine Sodium 75 mcg tablet  Omeprazole 40 mg capsule,delayed release  Alfuzosin Hcl Er 10 mg tablet, extended release 24 hr  Cetirizine Hcl 10 mg tablet  Donepezil Hcl 10 mg tablet  Ferrous Sulfate 325 mg (65 mg iron) tablet  Pravastatin Sodium 40 mg tablet  Propranolol Hcl 40 mg tablet  Quetiapine Fumarate 100 mg tablet  Venlafaxine Hcl 75 mg tablet  Vitamin B12     GU PSH: None     PSH Notes: Hernia Repair, L Wrist Surgery, Appendectomy   NON-GU  PSH: Appendectomy - 2009 Hernia Repair - 2009        GU PMH: Bladder-neck stenosis/contracture, Bladder outlet obstruction - 2014 BPH w/o LUTS, Benign prostatic hypertrophy without lower urinary tract symptoms - 2014 ED due to arterial insufficiency, Erectile dysfunction due to arterial insufficiency - 2014 Nocturia, Nocturia - 2014 Phimosis, Phimosis - 2014 Splitting of urinary stream, Intermittent urinary stream - 2014 Urinary Frequency, Increased urinary frequency - 2014 Weak Urinary Stream, Weak urinary stream - 2014      PMH Notes:  1898-12-24 00:00:00 - Note: Normal Routine History And Physical Senior Citizen (65-80)   NON-GU PMH: Cardiac murmur, unspecified, Murmurs - 2014 Personal history of other diseases of the circulatory system, History of hypertension - 2014 Personal history of other diseases of the digestive system, History of esophageal reflux - 2014 Personal history of other endocrine, nutritional and metabolic disease, History of diabetes mellitus - 2014, History of hypercholesterolemia, - 2014 Anxiety Depression Hypercholesterolemia Hypertension    FAMILY HISTORY: 2 daughters - No Family History 1 son - No Family History Death In The Family Father - Runs In Family Death In The Family Mother - Runs In Family Family Health Status Number - Runs In Family   SOCIAL HISTORY: Marital Status: Widowed Preferred Language: English; Race: White Current  Smoking Status: Patient does not smoke anymore.  <DIV'  Tobacco Use Assessment Completed:  Used Tobacco in last 30 days?   Has never drank.  Drinks 2 caffeinated drinks per day. Has not had a blood transfusion.     Notes: Occupation:, Alcohol Use, Caffeine Use, Marital History - Currently Married, Tobacco Use   REVIEW OF SYSTEMS:     GU Review Male:  Patient reports frequent urination, hard to postpone urination, burning/ pain with urination, and leakage of urine. Patient denies get up at night to urinate, stream  starts and stops, trouble starting your stream, have to strain to urinate , erection problems, and penile pain.    Gastrointestinal (Upper):  Patient denies nausea, vomiting, and indigestion/ heartburn.    Gastrointestinal (Lower):  Patient reports diarrhea. Patient denies constipation.    Constitutional:  Patient denies fever, night sweats, weight loss, and fatigue.    Skin:  Patient denies skin rash/ lesion and itching.    Eyes:  Patient denies blurred vision and double vision.    Ears/ Nose/ Throat:  Patient denies sore throat and sinus problems.    Hematologic/Lymphatic:  Patient reports easy bruising. Patient denies swollen glands.    Cardiovascular:  Patient denies leg swelling and chest pains.    Respiratory:  Patient denies cough and shortness of breath.    Endocrine:  Patient denies excessive thirst.    Musculoskeletal:  Patient denies back pain and joint pain.    Neurological:  Patient denies headaches and dizziness.    Psychologic:  Patient reports depression and anxiety.     Notes: Foreskin on Penis swollen and tight, some blood in urine      VITAL SIGNS:       09/01/2018 01:00 PM     Weight 185 lb / 83.91 kg     Height 69 in / 175.26 cm     BP 191/85 mmHg     Heart Rate 61 /min     Temperature 98.5 F / 36.9 C     BMI 27.3 kg/m     GU PHYSICAL EXAMINATION:      Scrotum: No lesions. No edema. No cysts. No warts.     Epididymides: Right: no spermatocele, no masses, no cysts, no tenderness, no induration, no enlargement. Left: no spermatocele, no masses, no cysts, no tenderness, no induration, no enlargement.     Testes: No tenderness, no swelling, no enlargement left testes. No tenderness, no swelling, no enlargement right testes. Normal location left testes. Normal location right testes. No mass, no cyst, no varicocele, no hydrocele left testes. No mass, no cyst, no varicocele, no hydrocele right testes.     Urethral Meatus: Normal size. No lesion, no wart, no discharge, no  polyp. Normal location.     Penis: Penis uncircumcised, phimosis, cracks and fissures. Balanitis. No foreskin warts. No meatal stenosis.      MULTI-SYSTEM PHYSICAL EXAMINATION:      Constitutional: Well-nourished. No physical deformities. Normally developed. Good grooming.     Neck: Neck symmetrical, not swollen. Normal tracheal position.     Respiratory: No labored breathing, no use of accessory muscles. CTA     Cardiovascular: Normal temperature, RRR without murmur     Lymphatic: No enlargement, no tenderness of groin, neck lymph nodes.     Skin: No paleness, no jaundice, no cyanosis. No lesion, no ulcer, no rash.     Neurologic / Psychiatric: Oriented to time, oriented to place, oriented to person. No depression, no anxiety, no agitation.  Gastrointestinal: Obese abdomen. No hernia. No mass, no tenderness, no rigidity.      Musculoskeletal: Normal gait and station of head and neck.            PAST DATA REVIEWED:   Source Of History:  Patient  Records Review:  Previous Patient Records  Urine Test Review:  Urinalysis   Notes:  Past office notes reviewed.    PROCEDURES:    Urinalysis w/Scope  Dipstick Dipstick Cont'd Micro  Color: Yellow Bilirubin: Neg mg/dL WBC/hpf: NS (Not Seen)  Appearance: Clear Ketones: Neg mg/dL RBC/hpf: NS (Not Seen)  Specific Gravity: 1.020 Blood: Neg ery/uL Bacteria: NS (Not Seen)  pH: <=5.0 Protein: 1+ mg/dL Cystals: NS (Not Seen)  Glucose: Neg mg/dL Urobilinogen: 0.2 mg/dL Casts: Hyaline   Nitrites: Neg Trichomonas: Not Present   Leukocyte Esterase: Neg leu/uL Mucous: Present    Epithelial Cells: 0 - 5/hpf    Yeast: NS (Not Seen)    Sperm: Not Present    ASSESSMENT:     ICD-10 Details  1 GU:  Phimosis - N47.1 He has severe phimosis with balanitis. I am going to put him on mycolog cream and set him up for a circumision. I reviewed the risks of bleeding, infection, penile scarring and injury, thrombotic events and anesthetic complications.   2   Balanoposthitis - N47.6    PLAN:   Medications  New Meds: Nystatin-Triamcinolone 100,000 unit/gram-0.1 % ointment 0.5 gram Topical TID #30 1 Refill(s)    Schedule  Return Visit/Planned Activity: Next Available Appointment - Schedule Surgery  Document  Letter(s):  Created for Patient: Clinical Summary

## 2018-10-01 NOTE — Progress Notes (Signed)
Called patient's daughter, Randye Lobo, to inform her of time change for surgery for 10/02/18. Patient to arrive 0645 for 0845 surgery on 10/02/18. Asked that she call back to inform us she received message.

## 2018-10-02 ENCOUNTER — Ambulatory Visit (HOSPITAL_COMMUNITY)
Admission: RE | Admit: 2018-10-02 | Discharge: 2018-10-02 | Disposition: A | Discharge status: Home / Self Care | Payer: Medicare Other | Source: Ambulatory Visit | Attending: Urology | Admitting: Urology

## 2018-10-02 ENCOUNTER — Ambulatory Visit (HOSPITAL_COMMUNITY): Payer: Medicare Other | Admitting: Anesthesiology

## 2018-10-02 ENCOUNTER — Encounter (HOSPITAL_COMMUNITY)
Admission: RE | Disposition: A | Discharge status: Home / Self Care | Payer: Self-pay | Source: Ambulatory Visit | Attending: Urology

## 2018-10-02 ENCOUNTER — Encounter (HOSPITAL_COMMUNITY): Payer: Self-pay

## 2018-10-02 DIAGNOSIS — Z87891 Personal history of nicotine dependence: Secondary | ICD-10-CM | POA: Insufficient documentation

## 2018-10-02 DIAGNOSIS — Z8673 Personal history of transient ischemic attack (TIA), and cerebral infarction without residual deficits: Secondary | ICD-10-CM | POA: Diagnosis not present

## 2018-10-02 DIAGNOSIS — K219 Gastro-esophageal reflux disease without esophagitis: Secondary | ICD-10-CM | POA: Insufficient documentation

## 2018-10-02 DIAGNOSIS — F329 Major depressive disorder, single episode, unspecified: Secondary | ICD-10-CM | POA: Insufficient documentation

## 2018-10-02 DIAGNOSIS — E119 Type 2 diabetes mellitus without complications: Secondary | ICD-10-CM | POA: Diagnosis not present

## 2018-10-02 DIAGNOSIS — F419 Anxiety disorder, unspecified: Secondary | ICD-10-CM | POA: Insufficient documentation

## 2018-10-02 DIAGNOSIS — E7800 Pure hypercholesterolemia, unspecified: Secondary | ICD-10-CM | POA: Insufficient documentation

## 2018-10-02 DIAGNOSIS — R011 Cardiac murmur, unspecified: Secondary | ICD-10-CM | POA: Insufficient documentation

## 2018-10-02 DIAGNOSIS — E039 Hypothyroidism, unspecified: Secondary | ICD-10-CM | POA: Diagnosis not present

## 2018-10-02 DIAGNOSIS — Z79899 Other long term (current) drug therapy: Secondary | ICD-10-CM | POA: Insufficient documentation

## 2018-10-02 DIAGNOSIS — N471 Phimosis: Secondary | ICD-10-CM | POA: Insufficient documentation

## 2018-10-02 DIAGNOSIS — N476 Balanoposthitis: Secondary | ICD-10-CM | POA: Insufficient documentation

## 2018-10-02 DIAGNOSIS — M1990 Unspecified osteoarthritis, unspecified site: Secondary | ICD-10-CM | POA: Diagnosis not present

## 2018-10-02 DIAGNOSIS — Z888 Allergy status to other drugs, medicaments and biological substances status: Secondary | ICD-10-CM | POA: Diagnosis not present

## 2018-10-02 DIAGNOSIS — I10 Essential (primary) hypertension: Secondary | ICD-10-CM | POA: Insufficient documentation

## 2018-10-02 HISTORY — PX: CIRCUMCISION: SHX1350

## 2018-10-02 LAB — GLUCOSE, CAPILLARY
Glucose-Capillary: 108 mg/dL — ABNORMAL HIGH (ref 70–99)
Glucose-Capillary: 113 mg/dL — ABNORMAL HIGH (ref 70–99)

## 2018-10-02 SURGERY — CIRCUMCISION, ADULT
Anesthesia: Monitor Anesthesia Care

## 2018-10-02 MED ORDER — FENTANYL CITRATE (PF) 100 MCG/2ML IJ SOLN
INTRAMUSCULAR | Status: DC | PRN
  Administered 2018-10-02: 25 ug via INTRAVENOUS

## 2018-10-02 MED ORDER — FENTANYL CITRATE (PF) 100 MCG/2ML IJ SOLN
INTRAMUSCULAR | Status: AC
  Filled 2018-10-02: qty 2

## 2018-10-02 MED ORDER — ONDANSETRON HCL 4 MG/2ML IJ SOLN
INTRAMUSCULAR | Status: AC
  Filled 2018-10-02: qty 2

## 2018-10-02 MED ORDER — TRAMADOL HCL 50 MG PO TABS: 50.0000 mg | ORAL_TABLET | Freq: Four times a day (QID) | ORAL | 0 refills | Status: AC | PRN

## 2018-10-02 MED ORDER — LACTATED RINGERS IV SOLN
INTRAVENOUS | Status: DC
  Administered 2018-10-02 (×2): via INTRAVENOUS

## 2018-10-02 MED ORDER — LIDOCAINE HCL (PF) 1 % IJ SOLN
INTRAMUSCULAR | Status: AC
  Filled 2018-10-02: qty 30

## 2018-10-02 MED ORDER — PROPOFOL 10 MG/ML IV BOLUS
INTRAVENOUS | Status: AC
  Filled 2018-10-02: qty 20

## 2018-10-02 MED ORDER — EPHEDRINE SULFATE-NACL 50-0.9 MG/10ML-% IV SOSY
PREFILLED_SYRINGE | INTRAVENOUS | Status: DC | PRN
  Administered 2018-10-02: 5 mg via INTRAVENOUS
  Administered 2018-10-02: 10 mg via INTRAVENOUS
  Administered 2018-10-02: 5 mg via INTRAVENOUS

## 2018-10-02 MED ORDER — LIDOCAINE HCL (CARDIAC) PF 50 MG/5ML IV SOSY
PREFILLED_SYRINGE | INTRAVENOUS | Status: DC | PRN
  Administered 2018-10-02: 50 mg via INTRAVENOUS

## 2018-10-02 MED ORDER — PHENYLEPHRINE 40 MCG/ML (10ML) SYRINGE FOR IV PUSH (FOR BLOOD PRESSURE SUPPORT)
PREFILLED_SYRINGE | INTRAVENOUS | Status: AC
  Filled 2018-10-02: qty 10

## 2018-10-02 MED ORDER — BUPIVACAINE HCL (PF) 0.25 % IJ SOLN
INTRAMUSCULAR | Status: AC
  Filled 2018-10-02: qty 30

## 2018-10-02 MED ORDER — PROPOFOL 10 MG/ML IV BOLUS
INTRAVENOUS | Status: DC | PRN
  Administered 2018-10-02 (×2): 20 mg via INTRAVENOUS

## 2018-10-02 MED ORDER — LIDOCAINE HCL (CARDIAC) PF 100 MG/5ML IV SOSY
PREFILLED_SYRINGE | INTRAVENOUS | Status: AC
  Filled 2018-10-02: qty 5

## 2018-10-02 MED ORDER — DEXAMETHASONE SODIUM PHOSPHATE 10 MG/ML IJ SOLN
INTRAMUSCULAR | Status: AC
  Filled 2018-10-02: qty 1

## 2018-10-02 MED ORDER — FENTANYL CITRATE (PF) 100 MCG/2ML IJ SOLN: 25.0000 ug | INTRAMUSCULAR | Status: DC | PRN

## 2018-10-02 MED ORDER — CEFAZOLIN SODIUM-DEXTROSE 2-4 GM/100ML-% IV SOLN
2.0000 g | INTRAVENOUS | Status: AC
  Administered 2018-10-02: 2 g via INTRAVENOUS
  Filled 2018-10-02: qty 100

## 2018-10-02 MED ORDER — BUPIVACAINE HCL 0.25 % IJ SOLN
INTRAMUSCULAR | Status: DC | PRN
  Administered 2018-10-02: 7.5 mL

## 2018-10-02 MED ORDER — LIDOCAINE HCL 1 % IJ SOLN
INTRAMUSCULAR | Status: DC | PRN
  Administered 2018-10-02: 7.5 mL

## 2018-10-02 MED ORDER — PROPOFOL 500 MG/50ML IV EMUL
INTRAVENOUS | Status: DC | PRN
  Administered 2018-10-02: 75 ug/kg/min via INTRAVENOUS

## 2018-10-02 MED ORDER — PHENYLEPHRINE 40 MCG/ML (10ML) SYRINGE FOR IV PUSH (FOR BLOOD PRESSURE SUPPORT)
PREFILLED_SYRINGE | INTRAVENOUS | Status: DC | PRN
  Administered 2018-10-02: 80 ug via INTRAVENOUS
  Administered 2018-10-02: 120 ug via INTRAVENOUS

## 2018-10-02 MED ORDER — EPHEDRINE 5 MG/ML INJ
INTRAVENOUS | Status: AC
  Filled 2018-10-02: qty 10

## 2018-10-02 SURGICAL SUPPLY — 26 items
BANDAGE COBAN STERILE 2 (GAUZE/BANDAGES/DRESSINGS) ×3 IMPLANT
BLADE SURG 15 STRL LF DISP TIS (BLADE) ×1 IMPLANT
BLADE SURG 15 STRL SS (BLADE) ×2
BNDG CONFORM 2 STRL LF (GAUZE/BANDAGES/DRESSINGS) IMPLANT
BNDG CONFORM 3 STRL LF (GAUZE/BANDAGES/DRESSINGS) ×3 IMPLANT
COVER SURGICAL LIGHT HANDLE (MISCELLANEOUS) ×3 IMPLANT
COVER WAND RF STERILE (DRAPES) IMPLANT
DECANTER SPIKE VIAL GLASS SM (MISCELLANEOUS) IMPLANT
DRAPE LAPAROTOMY T 98X78 PEDS (DRAPES) ×3 IMPLANT
ELECT NEEDLE TIP 2.8 STRL (NEEDLE) IMPLANT
ELECT PENCIL ROCKER SW 15FT (MISCELLANEOUS) ×3 IMPLANT
ELECT REM PT RETURN 15FT ADLT (MISCELLANEOUS) ×3 IMPLANT
GAUZE 4X4 16PLY RFD (DISPOSABLE) ×3 IMPLANT
GAUZE SPONGE 4X4 12PLY STRL (GAUZE/BANDAGES/DRESSINGS) IMPLANT
GAUZE XEROFORM 1X8 LF (GAUZE/BANDAGES/DRESSINGS) ×3 IMPLANT
GLOVE SURG SS PI 8.0 STRL IVOR (GLOVE) ×3 IMPLANT
GOWN STRL REUS W/TWL XL LVL3 (GOWN DISPOSABLE) ×3 IMPLANT
KIT BASIN OR (CUSTOM PROCEDURE TRAY) ×3 IMPLANT
NEEDLE HYPO 25X1 1.5 SAFETY (NEEDLE) ×3 IMPLANT
NS IRRIG 1000ML POUR BTL (IV SOLUTION) ×3 IMPLANT
PACK BASIC VI WITH GOWN DISP (CUSTOM PROCEDURE TRAY) ×3 IMPLANT
SUT CHROMIC 4 0 P 3 18 (SUTURE) ×6 IMPLANT
SUT CHROMIC 4 0 PS 2 18 (SUTURE) IMPLANT
SYR CONTROL 10ML LL (SYRINGE) ×3 IMPLANT
TOWEL OR 17X26 10 PK STRL BLUE (TOWEL DISPOSABLE) ×3 IMPLANT
WATER STERILE IRR 1000ML POUR (IV SOLUTION) IMPLANT

## 2018-10-02 NOTE — Anesthesia Preprocedure Evaluation (Signed)
Anesthesia Evaluation  Patient identified by MRN, date of birth, ID band Patient awake    Reviewed: Allergy & Precautions, NPO status , Patient's Chart, lab work & pertinent test results, reviewed documented beta blocker date and time   History of Anesthesia Complications Negative for: history of anesthetic complications  Airway Mallampati: II  TM Distance: >3 FB Neck ROM: Full    Dental  (+) Edentulous Upper, Edentulous Lower   Pulmonary shortness of breath, former smoker,    breath sounds clear to auscultation       Cardiovascular hypertension, Pt. on medications and Pt. on home beta blockers (-) angina+ DOE   Rhythm:Regular Rate:Normal  '18 ECHO: Normal LV systolic function; mild diastolic dysfunction; mild LVH; trace AI; mild LAE; mild TR with mildly elevated pulmonary pressure.   Neuro/Psych CVA, No Residual Symptoms    GI/Hepatic Neg liver ROS, GERD  Controlled and Medicated,  Endo/Other  diabetes (glu 108, diet controlled)Hypothyroidism   Renal/GU negative Renal ROS     Musculoskeletal  (+) Arthritis , Osteoarthritis,    Abdominal   Peds  Hematology negative hematology ROS (+)   Anesthesia Other Findings   Reproductive/Obstetrics                             Anesthesia Physical Anesthesia Plan  ASA: III  Anesthesia Plan: MAC   Post-op Pain Management:    Induction:   PONV Risk Score and Plan: 1 and Ondansetron  Airway Management Planned: Natural Airway and Simple Face Mask  Additional Equipment:   Intra-op Plan:   Post-operative Plan:   Informed Consent: I have reviewed the patients History and Physical, chart, labs and discussed the procedure including the risks, benefits and alternatives for the proposed anesthesia with the patient or authorized representative who has indicated his/her understanding and acceptance.   Consent reviewed with POA  Plan Discussed with:  CRNA and Surgeon  Anesthesia Plan Comments: (Plan routine monitors, MAC)        Anesthesia Quick Evaluation

## 2018-10-02 NOTE — Transfer of Care (Signed)
Immediate Anesthesia Transfer of Care Note  Patient: Douglas Grant  Procedure(s) Performed: CIRCUMCISION ADULT (N/A )  Patient Location: PACU  Anesthesia Type:MAC  Level of Consciousness: sedated and patient cooperative  Airway & Oxygen Therapy: Patient Spontanous Breathing and Patient connected to face mask oxygen  Post-op Assessment: Report given to RN and Post -op Vital signs reviewed and stable  Post vital signs: stable  Last Vitals:  Vitals Value Taken Time  BP 158/63 10/02/2018  9:45 AM  Temp    Pulse 49 10/02/2018  9:46 AM  Resp 11 10/02/2018  9:46 AM  SpO2 100 % 10/02/2018  9:46 AM  Vitals shown include unvalidated device data.  Last Pain:  Vitals:   10/02/18 0745  TempSrc:   PainSc: 0-No pain         Complications: No apparent anesthesia complications

## 2018-10-02 NOTE — Anesthesia Procedure Notes (Signed)
Procedure Name: [place] Performed by: Lissa Morales, CRNA Pre-anesthesia Checklist: Patient identified, Emergency Drugs available, Suction available and Patient being monitored Patient Re-evaluated:Patient Re-evaluated prior to induction Oxygen Delivery Method: Simple face mask Airway Equipment and Method: Oral airway Placement Confirmation: positive ETCO2

## 2018-10-02 NOTE — Anesthesia Postprocedure Evaluation (Signed)
Anesthesia Post Note  Patient: Douglas Grant  Procedure(s) Performed: CIRCUMCISION ADULT (N/A )     Patient location during evaluation: PACU Anesthesia Type: MAC Level of consciousness: awake and alert, oriented and patient cooperative Pain management: pain level controlled Vital Signs Assessment: post-procedure vital signs reviewed and stable Respiratory status: spontaneous breathing, nonlabored ventilation and respiratory function stable Cardiovascular status: blood pressure returned to baseline and stable Postop Assessment: no apparent nausea or vomiting Anesthetic complications: no    Last Vitals:  Vitals:   10/02/18 1045 10/02/18 1115  BP: (!) 168/72   Pulse:    Resp: 16   Temp:    SpO2: 96% 95%    Last Pain:  Vitals:   10/02/18 1033  TempSrc:   PainSc: 0-No pain                 Maurine Mowbray,E. Nadege Carriger

## 2018-10-02 NOTE — Interval H&P Note (Signed)
History and Physical Interval Note:  10/02/2018 7:28 AM  Douglas Grant  has presented today for surgery, with the diagnosis of PHIMOSIS  The various methods of treatment have been discussed with the patient and family. After consideration of risks, benefits and other options for treatment, the patient has consented to  Procedure(s): CIRCUMCISION ADULT (N/A) as a surgical intervention .  The patient's history has been reviewed, patient examined, no change in status, stable for surgery.  I have reviewed the patient's chart and labs.  Questions were answered to the patient's satisfaction.     [name]

## 2018-10-02 NOTE — Op Note (Signed)
Procedure: Circumcision.  Preoperative diagnosis: Phimosis.  Postoperative diagnosis: Same.  Surgeon: Dr. [name].  Resident: Dr. Shirlean Mylar.  Anesthesia: MAC and local.  Specimen: None.  Drain: None.  EBL: Minimal.  Complications: None.  Indications: Douglas Grant is an 84 year old white male with severe symptomatic phimosis is to undergo circumcision.  Procedure: He was given 2 g of Ancef.  He was placed on the table in supine position and sedation was given as needed.  PAS hose were placed.  His genitalia was prepped with Betadine solution he was draped in usual sterile fashion.  A penile block was performed with approximately 10 mL of 50% 2% lidocaine and 50% quarter percent Marcaine.  Dorsal and ventral slits were performed after crushing the tissue with a straight hemostat.  He had minimal residual ventral skin so a limited slit was performed on the ventral surface.  More extensive dorsal slit was performed to expose the glans.  Once the glans had been exposed additional prepped with Betadine solution was performed.  After examining the penis and the residual skin was felt that a complete circumcision would not be wise because of the limited ventral skin.  The ventral slit was closed longitudinally with a running 4-0 chromic.  The dorsal slit was refashioned.  The apex of the slit on the glans side and the penile side were approximated for approximately 3/8 of an inch in the midline with interrupted 4-0 chromic sutures and then the remaining skin was closed transversely with some additional trimming of the dogears on the lateral aspect of the prostate using running 4-0 chromic sutures.  Once all the skin edges were reapproximated inspection revealed excellent penile length and  an acceptably smooth penile shaft skin.  Dressing of Xeroform, Kling and Coban was applied.  He was taken to recovery room in stable condition.  There were no complications.

## 2018-10-03 ENCOUNTER — Encounter (HOSPITAL_COMMUNITY): Payer: Self-pay | Admitting: Urology

## 2018-10-13 ENCOUNTER — Telehealth: Payer: Self-pay | Admitting: Family Medicine

## 2018-10-13 DIAGNOSIS — E119 Type 2 diabetes mellitus without complications: Secondary | ICD-10-CM

## 2018-10-13 NOTE — Telephone Encounter (Signed)
Referral done

## 2018-10-13 NOTE — Telephone Encounter (Signed)
OK to place referral. TY.

## 2018-10-13 NOTE — Telephone Encounter (Signed)
Copied from [place] [id number]. Topic: Referral - Request for Referral >> Oct 13, 2018 11:35 AM Selinda Flavin B, NT wrote: Has patient seen PCP for this complaint? Yes.   *If NO, is insurance requiring patient see PCP for this issue before PCP can refer them? Referral for which specialty: Podiatry- to get toenails clipped and examined every 3 months  Preferred provider/office: Local office. Between [name] and [facility] Reason for referral: Current podiatrist office is closing effective immediately and are unsure where to go. CB#: [phone]

## 2018-10-13 NOTE — Addendum Note (Signed)
Addended by: Sharon Seller B on: 10/13/2018 12:50 PM   Modules accepted: Orders

## 2018-10-16 ENCOUNTER — Other Ambulatory Visit: Payer: Self-pay | Admitting: Family Medicine

## 2018-10-16 DIAGNOSIS — N471 Phimosis: Secondary | ICD-10-CM | POA: Diagnosis not present

## 2018-10-16 DIAGNOSIS — N476 Balanoposthitis: Secondary | ICD-10-CM | POA: Diagnosis not present

## 2018-11-06 ENCOUNTER — Ambulatory Visit: Payer: Medicare Other | Admitting: Family Medicine

## 2018-11-13 ENCOUNTER — Ambulatory Visit: Payer: Medicare Other | Admitting: Podiatry

## 2018-11-13 ENCOUNTER — Other Ambulatory Visit: Payer: Self-pay

## 2018-11-19 NOTE — Progress Notes (Signed)
Subjective:   Douglas Grant is a 84 y.o. male who presents for Medicare Annual/Subsequent preventive examination.  Pt accompanied by daughter. Pt states he likes to play musical instruments and watch family feud.  Review of Systems: No ROS.  Medicare Wellness Visit. Additional risk factors are reflected in the social history. Cardiac Risk Factors include: advanced age (>33men, >7 women);dyslipidemia;hypertension;male gender Sleep patterns: no issues   Home Safety/Smoke Alarms: Feels safe in home. Smoke alarms in place. Lives alone in one story home. Daughter checks on him 2x/day. Walk in shower.  Male:   CCS-No longer doing routine screening due to age.  PSA-  Lab Results  Component Value Date   PSA 0.16 06/12/2016   PSA 0.25 03/02/2014   PSA 0.58 11/25/2007       Objective:    Vitals: BP 140/80 (BP Location: Left Arm, Patient Position: Sitting, Cuff Size: Normal) Comment: all vitals done by R.Ewing CMA  Pulse 65   Ht 5\' 9"  (1.753 m)   Wt 192 lb (87.1 kg)   SpO2 93%   BMI 28.35 kg/m   Body mass index is 28.35 kg/m.  Advanced Directives 11/24/2018 10/02/2018 09/26/2018 08/31/2018 10/31/2017 05/26/2017 06/12/2016  Does Patient Have a Medical Advance Directive? No No No No Yes No Yes  Type of Advance Directive - - - - Press photographer;Living will - [place]  Does patient want to make changes to medical advance directive? - - - - - - Yes - information given  Copy of [place] in Chart? - - - - No - copy requested - No - copy requested  Would patient like information on creating a medical advance directive? Yes (MAU/Ambulatory/Procedural Areas - Information given) No - Patient declined - No - Patient declined - - -    Tobacco Social History   Tobacco Use  Smoking Status Former Smoker  . Years: 30.00  . Types: Cigarettes  . Last attempt to quit: 04/18/1985  . Years since quitting: 33.6  Smokeless Tobacco Never Used       Counseling given: Not Answered   Clinical Intake:     Pain : No/denies pain                 Past Medical History:  Diagnosis Date  . Arthritis   . Benign localized prostatic hyperplasia with lower urinary tract symptoms (LUTS)   . DOE (dyspnea on exertion)   . Full dentures   . Gait disturbance   . GERD (gastroesophageal reflux disease)   . Heart murmur   . History of CVA in adulthood 03/2011   acute infarct right lateral thalamus posteriorly in internal capsule,  per pt no residuals  . Hyperlipidemia   . Hypertension   . Hypothyroidism   . Iron deficiency anemia   . Memory difficulty   . Personal history of colonic polyps   . Type 2 diabetes, diet controlled ([place])    Past Surgical History:  Procedure Laterality Date  . CATARACT EXTRACTION W/ INTRAOCULAR LENS  IMPLANT, BILATERAL  2017  . CIRCUMCISION N/A 10/02/2018   Procedure: CIRCUMCISION ADULT;  Surgeon: [name], MD;  Location: [facility]S;  Service: Urology;  Laterality: N/A;  . LAPAROSCOPIC CHOLECYSTECTOMY  early 2000s   Family History  Problem Relation Age of Onset  . Depression Mother   . Alcohol abuse Father   . Alcohol abuse Brother    Social History   Socioeconomic History  . Marital status: Widowed  Spouse name: Not on file  . Number of children: Not on file  . Years of education: Not on file  . Highest education level: Not on file  Occupational History  . Not on file  Social Needs  . Financial resource strain: Not on file  . Food insecurity:    Worry: Not on file    Inability: Not on file  . Transportation needs:    Medical: Not on file    Non-medical: Not on file  Tobacco Use  . Smoking status: Former Smoker    Years: 30.00    Types: Cigarettes    Last attempt to quit: 04/18/1985    Years since quitting: 33.6  . Smokeless tobacco: Never Used  Substance and Sexual Activity  . Alcohol use: No    Alcohol/week: 0.0 standard drinks  . Drug use: Never  . Sexual activity: Never   Lifestyle  . Physical activity:    Days per week: Not on file    Minutes per session: Not on file  . Stress: Not on file  Relationships  . Social connections:    Talks on phone: Not on file    Gets together: Not on file    Attends religious service: Not on file    Active member of club or organization: Not on file    Attends meetings of clubs or organizations: Not on file    Relationship status: Not on file  Other Topics Concern  . Not on file  Social History Narrative  . Not on file    Outpatient Encounter Medications as of 11/24/2018  Medication Sig  . alfuzosin (UROXATRAL) 10 MG 24 hr tablet Take 1 tablet (10 mg total) by mouth daily. (Patient taking differently: Take 10 mg by mouth at bedtime. )  . cetirizine (ZYRTEC) 10 MG tablet Take 1 tablet (10 mg total) by mouth daily. (Patient taking differently: Take 10 mg by mouth every morning. )  . Ferrous Sulfate Dried (EQ SLOW-RELEASE IRON) 45 MG TBCR Take 45 mg by mouth 2 (two) times daily.  . finasteride (PROSCAR) 5 MG tablet Take 1 tablet (5 mg total) by mouth daily. (Patient taking differently: Take 5 mg by mouth every morning. )  . levothyroxine (SYNTHROID, LEVOTHROID) 75 MCG tablet TAKE 1 TABLET DAILY  . nystatin-triamcinolone ointment (MYCOLOG) Apply 1 application topically 2 (two) times daily.  Marland Kitchen omeprazole (PRILOSEC) 40 MG capsule Take 1 capsule (40 mg total) by mouth daily. (Patient taking differently: Take 40 mg by mouth every morning. )  . pravastatin (PRAVACHOL) 40 MG tablet Take 1 tablet (40 mg total) by mouth daily. (Patient taking differently: Take 40 mg by mouth every evening. )  . propranolol ER (INDERAL LA) 60 MG 24 hr capsule TAKE 1 CAPSULE DAILY (Patient taking differently: Take 60 mg by mouth at bedtime. )  . QUEtiapine (SEROQUEL) 100 MG tablet Take 1 tablet (100 mg total) by mouth at bedtime.  Marland Kitchen venlafaxine XR (EFFEXOR XR) 75 MG 24 hr capsule Take 1 capsule (75 mg total) by mouth daily with breakfast.  .  donepezil (ARICEPT) 10 MG tablet TAKE 1 TABLET AT BEDTIME (Patient taking differently: Take 10 mg by mouth at bedtime. )  . [DISCONTINUED] fluticasone (FLONASE ALLERGY RELIEF) 50 MCG/ACT nasal spray Place 2 sprays into both nostrils daily. (Patient not taking: Reported on 11/24/2018)  . [DISCONTINUED] vitamin B-12 (CYANOCOBALAMIN) 1000 MCG tablet Take 1 tablet (1,000 mcg total) by mouth daily.   No facility-administered encounter medications on file as of 11/24/2018.  Activities of Daily Living In your present state of health, do you have any difficulty performing the following activities: 11/24/2018 10/02/2018  Hearing? N N  Vision? N N  Difficulty concentrating or making decisions? Tempie Donning  Walking or climbing stairs? N Y  Dressing or bathing? N N  Doing errands, shopping? Y -  Conservation officer, nature and eating ? N -  Using the Toilet? N -  In the past six months, have you accidently leaked urine? N -  Do you have problems with loss of bowel control? N -  Managing your Medications? Y -  Managing your Finances? Y -  Some recent data might be hidden    Patient Care Team: Shelda Pal, DO as PCP - General (Family Medicine) Marygrace Drought, MD as Consulting Physician (Ophthalmology)   Assessment:   This is a routine wellness examination for [place]. Physical assessment deferred to PCP.  Exercise Activities and Dietary recommendations Current Exercise Habits: The patient does not participate in regular exercise at present, Exercise limited by: None identified Diet (meal preparation, eat out, water intake, caffeinated beverages, dairy products, fruits and vegetables): well balanced, on average, 2 meals per day   Goals    . Maintain current health and independece       Fall Risk Fall Risk  11/24/2018 10/31/2017 09/13/2016 06/12/2016 11/16/2014  Falls in the past year? 1 No No No No  Number falls in past yr: 1 - - - -  Injury with Fall? 0 - - - -    Depression Screen PHQ 2/9  Scores 11/24/2018 10/31/2017 09/13/2016 06/12/2016  PHQ - 2 Score 0 0 0 0    Cognitive Function MMSE - Mini Mental State Exam 11/24/2018 10/31/2017 06/12/2016  Not completed: Refused - -  Orientation to time - 5 5  Orientation to Place - 3 5  Registration - 3 3  Attention/ Calculation - 4 5  Recall - 0 3  Language- name 2 objects - 2 2  Language- repeat - 1 1  Language- follow 3 step command - 3 3  Language- read & follow direction - 1 1  Write a sentence - 1 1  Copy design - 1 1  Total score - 24 30        Immunization History  Administered Date(s) Administered  . Influenza Split 11/27/2011, 10/21/2012  . Influenza Whole 10/09/2005, 11/10/2007, 09/20/2008, 10/10/2009, 08/21/2010  . Influenza, High Dose Seasonal PF 10/08/2013, 10/03/2015, 08/27/2016, 10/31/2017, 11/24/2018  . Influenza,inj,Quad PF,6+ Mos 10/12/2014  . Pneumococcal Conjugate-13 11/16/2014  . Pneumococcal Polysaccharide-23 05/21/2006  . Td 06/04/2005, 06/27/2010  . Tdap 05/26/2017    Screening Tests Health Maintenance  Topic Date Due  . COLONOSCOPY  10/18/2016  . OPHTHALMOLOGY EXAM  11/14/2016  . FOOT EXAM  06/12/2017  . INFLUENZA VACCINE  07/24/2018  . URINE MICROALBUMIN  10/31/2018  . HEMOGLOBIN A1C  03/28/2019  . TETANUS/TDAP  05/27/2027  . PNA vac Low Risk Adult  Completed       Plan:    Please schedule your next medicare wellness visit with me in 1 yr.    I have personally reviewed and noted the following in the patient's chart:   . Medical and social history . Use of alcohol, tobacco or illicit drugs  . Current medications and supplements . Functional ability and status . Nutritional status . Physical activity . Advanced directives . List of other physicians . Hospitalizations, surgeries, and ER visits in previous 12 months . Vitals . Screenings to  include cognitive, depression, and falls . Referrals and appointments  In addition, I have reviewed and discussed with patient certain  preventive protocols, quality metrics, and best practice recommendations. A written personalized care plan for preventive services as well as general preventive health recommendations were provided to patient.     Shela Nevin, [place]  11/24/2018

## 2018-11-24 ENCOUNTER — Encounter: Payer: Self-pay | Admitting: *Deleted

## 2018-11-24 ENCOUNTER — Encounter: Payer: Self-pay | Admitting: Family Medicine

## 2018-11-24 ENCOUNTER — Ambulatory Visit (INDEPENDENT_AMBULATORY_CARE_PROVIDER_SITE_OTHER): Payer: Medicare Other | Admitting: *Deleted

## 2018-11-24 ENCOUNTER — Ambulatory Visit (INDEPENDENT_AMBULATORY_CARE_PROVIDER_SITE_OTHER): Payer: Medicare Other | Admitting: Family Medicine

## 2018-11-24 VITALS — BP 140/80 | HR 65 | Ht 69.0 in | Wt 192.0 lb

## 2018-11-24 VITALS — BP 140/80 | HR 65 | Temp 98.1°F | Ht 69.0 in | Wt 192.2 lb

## 2018-11-24 DIAGNOSIS — E039 Hypothyroidism, unspecified: Secondary | ICD-10-CM

## 2018-11-24 DIAGNOSIS — K219 Gastro-esophageal reflux disease without esophagitis: Secondary | ICD-10-CM | POA: Diagnosis not present

## 2018-11-24 DIAGNOSIS — Z0000 Encounter for general adult medical examination without abnormal findings: Secondary | ICD-10-CM

## 2018-11-24 DIAGNOSIS — G252 Other specified forms of tremor: Secondary | ICD-10-CM

## 2018-11-24 DIAGNOSIS — I10 Essential (primary) hypertension: Secondary | ICD-10-CM

## 2018-11-24 DIAGNOSIS — E118 Type 2 diabetes mellitus with unspecified complications: Secondary | ICD-10-CM | POA: Diagnosis not present

## 2018-11-24 DIAGNOSIS — Z23 Encounter for immunization: Secondary | ICD-10-CM

## 2018-11-24 NOTE — Progress Notes (Signed)
Chief Complaint  Patient presents with  . Follow-up    Subjective: Patient is a 84 y.o. male here for med ck.  He is here with his daughter who helps provide the history.  He has a history of diet-controlled diabetes.  He has been off of metformin for quite some time.  Around 6 weeks ago, his A1c was 6.0.  Diet is healthy overall, he does not do much exercise at this time.  He is due for an eye exam.  He is up-to-date with his immunizations.  He is on a statin.  He has a history of low thyroid and is well controlled on 75 mcg levothyroxine daily.  He is not having any symptoms, unconcerned with his dose.  History of an intention tremor.  He responded very well to propranolol.  He is tolerating this medicine well.  History of reflux.  He is currently on omeprazole but is not having any symptoms.  He is interested in coming off of any medication.  ROS: Heart: Denies chest pain  Lungs: Denies SOB   Past Medical History:  Diagnosis Date  . Arthritis   . Benign localized prostatic hyperplasia with lower urinary tract symptoms (LUTS)   . DOE (dyspnea on exertion)   . Full dentures   . Gait disturbance   . GERD (gastroesophageal reflux disease)   . Heart murmur   . History of CVA in adulthood 03/2011   acute infarct right lateral thalamus posteriorly in internal capsule,  per pt no residuals  . Hyperlipidemia   . Hypertension   . Hypothyroidism   . Iron deficiency anemia   . Memory difficulty   . Personal history of colonic polyps   . Type 2 diabetes, diet controlled ([facility])     Objective: BP 140/80 (BP Location: Left Arm, Patient Position: Sitting, Cuff Size: Normal)   Pulse 65   Temp 98.1 F (36.7 C) (Oral)   Ht 5\' 9"  (1.753 m)   Wt 192 lb 4 oz (87.2 kg)   SpO2 93%   BMI 28.39 kg/m  General: Awake, appears stated age Neuro: No tremor Heart: RRR Lungs: CTAB, no rales, wheezes or rhonchi. No accessory muscle use Psych: Limited judgment and insight, normal affect and  mood  Assessment and Plan: Controlled type 2 diabetes mellitus with complication, without long-term current use of insulin ([place]) - Plan: Microalbumin / creatinine urine ratio, Lipid panel, Comprehensive metabolic panel  Need for influenza vaccination - Plan: Flu vaccine HIGH DOSE PF (Fluzone High dose)  Intention tremor  Essential hypertension - Plan: Comprehensive metabolic panel  Hypothyroidism, unspecified type - Plan: TSH  Gastroesophageal reflux disease, esophagitis presence not specified  Call Dr. Satira Sark for eye exam.  He has an appointment with his podiatrist next week.  Check above labs. Does not need CCS at his age.  Continue propranolol for his intention tremor. Blood pressure well controlled for age. Continue thyroid medication, check levels today. Follow-up in 6 months or as needed. The patient and his daughter voiced understanding and agreement to the plan.  [place], DO 11/24/18  5:14 PM

## 2018-11-24 NOTE — Progress Notes (Signed)
Pre visit review using our clinic review tool, if applicable. No additional management support is needed unless otherwise documented below in the visit note. 

## 2018-11-24 NOTE — Progress Notes (Signed)
Noted. Agree with above.  [place], DO 11/24/18 5:08 PM

## 2018-11-24 NOTE — Patient Instructions (Signed)
Please schedule your next medicare wellness visit with me in 1 yr.   Douglas Grant , Thank you for taking time to come for your Medicare Wellness Visit. I appreciate your ongoing commitment to your health goals. Please review the following plan we discussed and let me know if I can assist you in the future.   These are the goals we discussed: Goals    . Maintain current health and independece       This is a list of the screening recommended for you and due dates:  Health Maintenance  Topic Date Due  . Colon Cancer Screening  10/18/2016  . Eye exam for diabetics  11/14/2016  . Complete foot exam   06/12/2017  . Flu Shot  07/24/2018  . Urine Protein Check  10/31/2018  . Hemoglobin A1C  03/28/2019  . Tetanus Vaccine  05/27/2027  . Pneumonia vaccines  Completed    Health Maintenance, Male A healthy lifestyle and preventive care is important for your health and wellness. Ask your health care provider about what schedule of regular examinations is right for you. What should I know about weight and diet? Eat a Healthy Diet  Eat plenty of vegetables, fruits, whole grains, low-fat dairy products, and lean protein.  Do not eat a lot of foods high in solid fats, added sugars, or salt.  Maintain a Healthy Weight Regular exercise can help you achieve or maintain a healthy weight. You should:  Do at least 150 minutes of exercise each week. The exercise should increase your heart rate and make you sweat (moderate-intensity exercise).  Do strength-training exercises at least twice a week.  Watch Your Levels of Cholesterol and Blood Lipids  Have your blood tested for lipids and cholesterol every 5 years starting at 82 years of age. If you are at high risk for heart disease, you should start having your blood tested when you are 82 years old. You may need to have your cholesterol levels checked more often if: ? Your lipid or cholesterol levels are high. ? You are older than 82 years of  age. ? You are at high risk for heart disease.  What should I know about cancer screening? Many types of cancers can be detected early and may often be prevented. Lung Cancer  You should be screened every year for lung cancer if: ? You are a current smoker who has smoked for at least 30 years. ? You are a former smoker who has quit within the past 15 years.  Talk to your health care provider about your screening options, when you should start screening, and how often you should be screened.  Colorectal Cancer  Routine colorectal cancer screening usually begins at 82 years of age and should be repeated every 5-10 years until you are 82 years old. You may need to be screened more often if early forms of precancerous polyps or small growths are found. Your health care provider may recommend screening at an earlier age if you have risk factors for colon cancer.  Your health care provider may recommend using home test kits to check for hidden blood in the stool.  A small camera at the end of a tube can be used to examine your colon (sigmoidoscopy or colonoscopy). This checks for the earliest forms of colorectal cancer.  Prostate and Testicular Cancer  Depending on your age and overall health, your health care provider may do certain tests to screen for prostate and testicular cancer.  Talk to your  health care provider about any symptoms or concerns you have about testicular or prostate cancer.  Skin Cancer  Check your skin from head to toe regularly.  Tell your health care provider about any new moles or changes in moles, especially if: ? There is a change in a mole's size, shape, or color. ? You have a mole that is larger than a pencil eraser.  Always use sunscreen. Apply sunscreen liberally and repeat throughout the day.  Protect yourself by wearing long sleeves, pants, a wide-brimmed hat, and sunglasses when outside.  What should I know about heart disease, diabetes, and high  blood pressure?  If you are 69-79 years of age, have your blood pressure checked every 3-5 years. If you are 50 years of age or older, have your blood pressure checked every year. You should have your blood pressure measured twice-once when you are at a hospital or clinic, and once when you are not at a hospital or clinic. Record the average of the two measurements. To check your blood pressure when you are not at a hospital or clinic, you can use: ? An automated blood pressure machine at a pharmacy. ? A home blood pressure monitor.  Talk to your health care provider about your target blood pressure.  If you are between 40-32 years old, ask your health care provider if you should take aspirin to prevent heart disease.  Have regular diabetes screenings by checking your fasting blood sugar level. ? If you are at a normal weight and have a low risk for diabetes, have this test once every three years after the age of 67. ? If you are overweight and have a high risk for diabetes, consider being tested at a younger age or more often.  A one-time screening for abdominal aortic aneurysm (AAA) by ultrasound is recommended for men aged 33-75 years who are current or former smokers. What should I know about preventing infection? Hepatitis B If you have a higher risk for hepatitis B, you should be screened for this virus. Talk with your health care provider to find out if you are at risk for hepatitis B infection. Hepatitis C Blood testing is recommended for:  Everyone born from [birth year] through 1963.  Anyone with known risk factors for hepatitis C.  Sexually Transmitted Diseases (STDs)  You should be screened each year for STDs including gonorrhea and chlamydia if: ? You are sexually active and are younger than 82 years of age. ? You are older than 82 years of age and your health care provider tells you that you are at risk for this type of infection. ? Your sexual activity has changed since you were  last screened and you are at an increased risk for chlamydia or gonorrhea. Ask your health care provider if you are at risk.  Talk with your health care provider about whether you are at high risk of being infected with HIV. Your health care provider may recommend a prescription medicine to help prevent HIV infection.  What else can I do?  Schedule regular health, dental, and eye exams.  Stay current with your vaccines (immunizations).  Do not use any tobacco products, such as cigarettes, chewing tobacco, and e-cigarettes. If you need help quitting, ask your health care provider.  Limit alcohol intake to no more than 2 drinks per day. One drink equals 12 ounces of beer, 5 ounces of wine, or 1 ounces of hard liquor.  Do not use street drugs.  Do not share needles.  Ask your health care provider for help if you need support or information about quitting drugs.  Tell your health care provider if you often feel depressed.  Tell your health care provider if you have ever been abused or do not feel safe at home. This information is not intended to replace advice given to you by your health care provider. Make sure you discuss any questions you have with your health care provider. Document Released: 06/07/2008 Document Revised: 08/08/2016 Document Reviewed: 09/13/2015 Elsevier Interactive Patient Education  [employer].

## 2018-11-24 NOTE — Patient Instructions (Addendum)
Call your eye provider for an appointment. Dr Marygrace Drought.  Keep the diet clean and stay active.  OK to try stopping the Zyrtec if you want. You can also try stopping the omeprazole.  Give [place] 2-3 business days to get the results of your labs back.   Let us know if you need anything.

## 2018-11-25 LAB — COMPREHENSIVE METABOLIC PANEL
ALK PHOS: 43 U/L (ref 39–117)
ALT: 12 U/L (ref 0–53)
AST: 13 U/L (ref 0–37)
Albumin: 4 g/dL (ref 3.5–5.2)
BILIRUBIN TOTAL: 0.7 mg/dL (ref 0.2–1.2)
BUN: 25 mg/dL — AB (ref 6–23)
CO2: 32 mEq/L (ref 19–32)
Calcium: 9.3 mg/dL (ref 8.4–10.5)
Chloride: 103 mEq/L (ref 96–112)
Creatinine, Ser: 1.14 mg/dL (ref 0.40–1.50)
GFR: 64.7 mL/min (ref >60.00)
GLUCOSE: 94 mg/dL (ref 70–99)
Potassium: 4.9 mEq/L (ref 3.5–5.1)
Sodium: 140 mEq/L (ref 135–145)
TOTAL PROTEIN: 6.5 g/dL (ref 6.0–8.3)

## 2018-11-25 LAB — LIPID PANEL
Cholesterol: 140 mg/dL (ref 0–200)
HDL: 46.4 mg/dL (ref >39.00)
LDL Cholesterol: 63 mg/dL (ref 0–99)
NONHDL: 93.7
Total CHOL/HDL Ratio: 3
Triglycerides: 152 mg/dL — ABNORMAL HIGH (ref 0.0–149.0)
VLDL: 30.4 mg/dL (ref 0.0–40.0)

## 2018-11-25 LAB — MICROALBUMIN / CREATININE URINE RATIO
CREATININE, U: 120.9 mg/dL
MICROALB/CREAT RATIO: 10 mg/g (ref 0.0–30.0)
Microalb, Ur: 12.1 mg/dL — ABNORMAL HIGH (ref 0.0–1.9)

## 2018-11-25 LAB — TSH: TSH: 2.61 u[IU]/mL (ref 0.35–4.50)

## 2018-11-26 ENCOUNTER — Other Ambulatory Visit: Payer: Self-pay | Admitting: Family Medicine

## 2018-11-26 DIAGNOSIS — F0390 Unspecified dementia without behavioral disturbance: Secondary | ICD-10-CM

## 2018-12-11 ENCOUNTER — Telehealth: Payer: Self-pay | Admitting: Family Medicine

## 2018-12-11 DIAGNOSIS — E1159 Type 2 diabetes mellitus with other circulatory complications: Secondary | ICD-10-CM | POA: Diagnosis not present

## 2018-12-11 DIAGNOSIS — M79671 Pain in right foot: Secondary | ICD-10-CM | POA: Diagnosis not present

## 2018-12-11 DIAGNOSIS — M79672 Pain in left foot: Secondary | ICD-10-CM | POA: Diagnosis not present

## 2018-12-11 DIAGNOSIS — B351 Tinea unguium: Secondary | ICD-10-CM | POA: Diagnosis not present

## 2018-12-11 NOTE — Telephone Encounter (Signed)
Copied from [place] [id number]. Topic: Quick Communication - See Telephone Encounter >> Dec 11, 2018  3:41 PM Blase Mess A wrote: CRM for notification. See Telephone encounter for: 12/11/18.  Sandy from [facility] is requesting the patient's current medication list to be faxed [phone]

## 2018-12-15 ENCOUNTER — Other Ambulatory Visit: Payer: Self-pay | Admitting: Family Medicine

## 2018-12-15 NOTE — Telephone Encounter (Signed)
Faxed to number given medication list.

## 2018-12-29 ENCOUNTER — Other Ambulatory Visit: Payer: Self-pay | Admitting: Family Medicine

## 2018-12-29 ENCOUNTER — Telehealth: Payer: Self-pay | Admitting: Family Medicine

## 2018-12-29 MED ORDER — ALFUZOSIN HCL ER 10 MG PO TB24: 10.0000 mg | ORAL_TABLET | Freq: Every day | ORAL | 1 refills | Status: DC

## 2018-12-29 NOTE — Telephone Encounter (Signed)
Copied from [place] [id number]. Topic: Quick Communication - Rx Refill/Question >> Dec 29, 2018 12:38 PM Waylan Rocher, Lumin L wrote: Medication: alfuzosin (UROXATRAL) 10 MG 24 hr tablet (needs a short supply to local pharmacy in order to get through to mail order delivery and only has enough to last till tomorrow)  Has the patient contacted their pharmacy? No. (Agent: If no, request that the patient contact the pharmacy for the refill.) (Agent: If yes, when and what did the pharmacy advise?)  Preferred Pharmacy (with phone number or street name): [facility] [address] [place] [address] Phone: [phone] Fax: [phone]  Agent: Please be advised that RX refills may take up to 3 business days. We ask that you follow-up with your pharmacy.

## 2018-12-29 NOTE — Telephone Encounter (Signed)
Refill done.  

## 2019-02-07 ENCOUNTER — Other Ambulatory Visit: Payer: Self-pay | Admitting: Family Medicine

## 2019-03-16 ENCOUNTER — Other Ambulatory Visit: Payer: Self-pay | Admitting: Family Medicine

## 2019-04-09 DIAGNOSIS — E1159 Type 2 diabetes mellitus with other circulatory complications: Secondary | ICD-10-CM | POA: Diagnosis not present

## 2019-04-09 DIAGNOSIS — M79672 Pain in left foot: Secondary | ICD-10-CM | POA: Diagnosis not present

## 2019-04-09 DIAGNOSIS — B351 Tinea unguium: Secondary | ICD-10-CM | POA: Diagnosis not present

## 2019-04-09 DIAGNOSIS — M79671 Pain in right foot: Secondary | ICD-10-CM | POA: Diagnosis not present

## 2019-05-06 ENCOUNTER — Other Ambulatory Visit: Payer: Self-pay | Admitting: Family Medicine

## 2019-05-26 ENCOUNTER — Ambulatory Visit: Payer: Medicare Other | Admitting: Family Medicine

## 2019-05-28 ENCOUNTER — Ambulatory Visit: Payer: Medicare Other | Admitting: Family Medicine

## 2019-07-09 DIAGNOSIS — E1159 Type 2 diabetes mellitus with other circulatory complications: Secondary | ICD-10-CM | POA: Diagnosis not present

## 2019-07-09 DIAGNOSIS — M79672 Pain in left foot: Secondary | ICD-10-CM | POA: Diagnosis not present

## 2019-07-09 DIAGNOSIS — B351 Tinea unguium: Secondary | ICD-10-CM | POA: Diagnosis not present

## 2019-07-09 DIAGNOSIS — M79671 Pain in right foot: Secondary | ICD-10-CM | POA: Diagnosis not present

## 2019-07-21 ENCOUNTER — Ambulatory Visit: Payer: Medicare Other | Admitting: Family Medicine

## 2019-07-22 ENCOUNTER — Ambulatory Visit: Payer: Medicare Other | Admitting: Family Medicine

## 2019-07-29 ENCOUNTER — Encounter: Payer: Self-pay | Admitting: Family Medicine

## 2019-07-29 ENCOUNTER — Ambulatory Visit (INDEPENDENT_AMBULATORY_CARE_PROVIDER_SITE_OTHER): Payer: Medicare Other | Admitting: Family Medicine

## 2019-07-29 ENCOUNTER — Other Ambulatory Visit: Payer: Self-pay

## 2019-07-29 VITALS — BP 170/100 | HR 50 | Temp 98.8°F | Ht 65.0 in | Wt 191.4 lb

## 2019-07-29 DIAGNOSIS — R350 Frequency of micturition: Secondary | ICD-10-CM

## 2019-07-29 DIAGNOSIS — F0390 Unspecified dementia without behavioral disturbance: Secondary | ICD-10-CM

## 2019-07-29 HISTORY — DX: Unspecified dementia, unspecified severity, without behavioral disturbance, psychotic disturbance, mood disturbance, and anxiety: F03.90

## 2019-07-29 LAB — POCT URINALYSIS DIPSTICK
Bilirubin, UA: NEGATIVE
Blood, UA: NEGATIVE
Glucose, UA: NEGATIVE
Ketones, UA: NEGATIVE
Leukocytes, UA: NEGATIVE
Nitrite, UA: NEGATIVE
Protein, UA: POSITIVE — AB
Spec Grav, UA: >=1.03 — AB (ref 1.010–1.025)
Urobilinogen, UA: 0.2 E.U./dL
pH, UA: 5.5 (ref 5.0–8.0)

## 2019-07-29 MED ORDER — MEMANTINE HCL 5 MG PO TABS: ORAL_TABLET | ORAL | 1 refills | Status: DC

## 2019-07-29 MED ORDER — CEPHALEXIN 500 MG PO CAPS: 500.0000 mg | ORAL_CAPSULE | Freq: Two times a day (BID) | ORAL | 0 refills | Status: AC

## 2019-07-29 MED FILL — CEPHALEXIN 500 MG CAPSULE: 500 | 7 days supply | Qty: 14 | Fill #0

## 2019-07-29 MED FILL — MEMANTINE HCL 5 MG TABS: 5 | 30 days supply | Qty: 60 | Fill #0

## 2019-07-29 NOTE — Progress Notes (Signed)
Chief Complaint  Patient presents with  . Follow-up    Subjective: Patient is a 85 y.o. male here for memory issues.  He is here with his son-in-law who helps provide the history.  Over the weekend, the patient has not been acting himself.  His memory is worse than usual.  Additionally, he has been urinating more frequently.  Denies any pain with urination, bleeding, urgency, fevers, or history of urinary tract infections. He is compliant with Aricept, though it does give him diarrhea. Fam manages this with anti-diarrheals. No other incident took place over weekend that could have triggered this. He does not have a hx of UTI's. Has been taking Finasteride and Uroxatral.   BP elevated today. Does not check routinely at home. No CP or SOB.  ROS: Psych: No agitation GU: +freq  Past Medical History:  Diagnosis Date  . Arthritis   . Benign localized prostatic hyperplasia with lower urinary tract symptoms (LUTS)   . DOE (dyspnea on exertion)   . Full dentures   . Gait disturbance   . GERD (gastroesophageal reflux disease)   . Heart murmur   . History of CVA in adulthood 03/2011   acute infarct right lateral thalamus posteriorly in internal capsule,  per pt no residuals  . Hyperlipidemia   . Hypertension   . Hypothyroidism   . Iron deficiency anemia   . Memory difficulty   . Personal history of colonic polyps   . Type 2 diabetes, diet controlled (HCC)     Objective: BP (!) 170/100 (BP Location: Left Arm, Patient Position: Sitting, Cuff Size: Normal)   Pulse (!) 50   Temp 98.8 F (37.1 C) (Oral)   Ht 5' 5"  (1.651 m)   Wt 191 lb 6 oz (86.8 kg)   SpO2 93%   BMI 31.85 kg/m  General: Awake, appears stated age Heart: RRR, 2+LE edema b/l Lungs: CTAB, no rales, wheezes or rhonchi. No accessory muscle use  Neuro: Alert to person and place; unsure of date- Dec 1990, president Abd: S, NT, ND Psych: Limited judgment and insight, normal affect and mood  Assessment and  Plan: Increased frequency of urination - Plan: POCT Urinalysis Dipstick, Urine Culture, cephALEXin (KEFLEX) 500 MG capsule; will empirically treat for UTI.  Culture pending.  Dementia without behavioral disturbance, unspecified dementia type ([place]) - Plan: memantine (NAMENDA) 5 MG tablet; continue Aricept, add Namenda.  May be more delirium with infectious process contributing.  If a follow-up he is no better, will consider home placement. Will stop statin as benefit is unlikely to be met at this stage in his life.  Orders as above. The patient and his son-in-law voiced understanding and agreement to the plan.  [place], DO 07/29/19  12:16 PM

## 2019-07-29 NOTE — Patient Instructions (Addendum)
Let's stop the pravastatin.   We are adding memantine.  Let us know if you need anything.

## 2019-07-31 ENCOUNTER — Ambulatory Visit: Payer: Medicare Other | Admitting: Family Medicine

## 2019-07-31 LAB — URINE CULTURE
MICRO NUMBER:: 739455
SPECIMEN QUALITY:: ADEQUATE

## 2019-08-25 MED FILL — MEMANTINE HCL 5 MG TABS: 5 | 30 days supply | Qty: 60 | Fill #1

## 2019-09-01 ENCOUNTER — Encounter: Payer: Self-pay | Admitting: Family Medicine

## 2019-09-01 ENCOUNTER — Ambulatory Visit (INDEPENDENT_AMBULATORY_CARE_PROVIDER_SITE_OTHER): Payer: Medicare Other | Admitting: Family Medicine

## 2019-09-01 ENCOUNTER — Other Ambulatory Visit: Payer: Self-pay

## 2019-09-01 VITALS — BP 160/86 | HR 82 | Temp 98.0°F | Ht 69.5 in | Wt 198.1 lb

## 2019-09-01 DIAGNOSIS — R030 Elevated blood-pressure reading, without diagnosis of hypertension: Secondary | ICD-10-CM | POA: Diagnosis not present

## 2019-09-01 DIAGNOSIS — Z09 Encounter for follow-up examination after completed treatment for conditions other than malignant neoplasm: Secondary | ICD-10-CM | POA: Diagnosis not present

## 2019-09-01 DIAGNOSIS — F0390 Unspecified dementia without behavioral disturbance: Secondary | ICD-10-CM

## 2019-09-01 MED ORDER — MEMANTINE HCL 5 MG PO TABS: 5.0000 mg | ORAL_TABLET | Freq: Two times a day (BID) | ORAL | 3 refills | Status: DC

## 2019-09-01 NOTE — Patient Instructions (Addendum)
I recommend getting the flu shot in mid October. This suggestion would change if the CDC comes out with a different recommendation.   Great work with the changes.  I want the blood pressure to be <150/90. Check around 3 times weekly over the next month. If consistently elevated, I want to see you in the office in 1 month. Alternate morning and evening.    Let us know if you need anything.

## 2019-09-01 NOTE — Progress Notes (Signed)
Chief Complaint  Patient presents with  . Follow-up    Subjective: Patient is a 85 y.o. male here for f/u dementia. Here w son-in-law who helps provide hx.  Patient was treated for Keflex at his last visit.  He was having urinary frequency.  Since starting the medicine, he is not having any problems.  He is no longer having urinary incontinence either.  His memory and mood seem to have stabilized/improved.  He is currently taking Namenda in addition to Aricept.  He is not having any side effects with this medication.  Patient has a history of high blood pressure.  He does not check his blood pressures routinely.  He is compliant with his medications.  No adverse reactions.  Diet is fair, he tries to stay active.   ROS: GU: As noted in HPI Neuro: No worsening mentation  Past Medical History:  Diagnosis Date  . Arthritis   . Benign localized prostatic hyperplasia with lower urinary tract symptoms (LUTS)   . DOE (dyspnea on exertion)   . Full dentures   . Gait disturbance   . GERD (gastroesophageal reflux disease)   . Heart murmur   . History of CVA in adulthood 03/2011   acute infarct right lateral thalamus posteriorly in internal capsule,  per pt no residuals  . Hyperlipidemia   . Hypertension   . Hypothyroidism   . Iron deficiency anemia   . Memory difficulty   . Personal history of colonic polyps   . Type 2 diabetes, diet controlled (HCC)     Objective: BP (!) 160/86 (BP Location: Left Arm, Patient Position: Sitting, Cuff Size: Normal)   Pulse 82   Temp 98 F (36.7 C) (Temporal)   Ht 5' 9.5" (1.765 m)   Wt 198 lb 2 oz (89.9 kg)   SpO2 93%   BMI 28.84 kg/m  General: Awake, appears stated age HEENT: MMM, EOMi Heart: RRR, 2+ lower extremity edema bilaterally up to proximal third of tibia, no bruits Lungs: CTAB, no rales, wheezes or rhonchi. No accessory muscle use Neuro: Alert to person, place, and president; does not know the date and does not remember my  name Psych: limited judgment and insight, normal affect and mood  Assessment and Plan: Follow-up for resolved condition  Dementia without behavioral disturbance, unspecified dementia type ([place]) - Plan: memantine (NAMENDA) 5 MG tablet  Elevated blood pressure reading  Urinary issue has resolved. Dementia appears to be stable.  Continue Namenda and Aricept. I rechecked his blood pressure and it is still elevated.  He will check at home with the help of his son-in-law.  If no improvement, we will have him return in 1 month.  Blood pressure goal is less than 150/90. The patient voiced understanding and agreement to the plan.  [place], DO 09/01/19  5:03 PM

## 2019-10-08 DIAGNOSIS — E1159 Type 2 diabetes mellitus with other circulatory complications: Secondary | ICD-10-CM | POA: Diagnosis not present

## 2019-10-08 DIAGNOSIS — B351 Tinea unguium: Secondary | ICD-10-CM | POA: Diagnosis not present

## 2019-10-08 DIAGNOSIS — M79671 Pain in right foot: Secondary | ICD-10-CM | POA: Diagnosis not present

## 2019-10-08 DIAGNOSIS — M79672 Pain in left foot: Secondary | ICD-10-CM | POA: Diagnosis not present

## 2019-11-07 DIAGNOSIS — Z23 Encounter for immunization: Secondary | ICD-10-CM | POA: Diagnosis not present

## 2019-12-06 ENCOUNTER — Other Ambulatory Visit: Payer: Self-pay | Admitting: Family Medicine

## 2019-12-06 DIAGNOSIS — G252 Other specified forms of tremor: Secondary | ICD-10-CM

## 2019-12-24 ENCOUNTER — Ambulatory Visit: Payer: Self-pay | Admitting: *Deleted

## 2019-12-24 NOTE — Telephone Encounter (Signed)
The son in law is with the patient and the patient has given permission to speak to the nurse. He stated that the patient has been getting short of breath since the beginning of the week. He seems to be fine in the morning but towards the evening gets more short of breath. He helps him with his shower twice a week and finds that when he is in the shower he can hear his breathing. He does not have chronic respiratory problems or heart conditions per son in law.  Per protocol. The patient is advised to go to the ED for shortness of breath. He is requesting an appointment for Monday. He stated that if he gets worse, he will call 911. He would like a call back from his pcp on Monday and get an appointment for Tuesday. His number is [phone].  Reason for Disposition . [1] MODERATE difficulty breathing (e.g., speaks in phrases, SOB even at rest, pulse 100-120) AND [2] NEW-onset or WORSE than normal  Answer Assessment - Initial Assessment Questions 1. RESPIRATORY STATUS: "Describe your breathing?" (e.g., wheezing, shortness of breath, unable to speak, severe coughing)      Shortness of breath 2. ONSET: "When did this breathing problem begin?"      Going on all week 3. PATTERN "Does the difficult breathing come and go, or has it been constant since it started?"      Comes and goes 4. SEVERITY: "How bad is your breathing?" (e.g., mild, moderate, severe)    - MILD: No SOB at rest, mild SOB with walking, speaks normally in sentences, can lay down, no retractions, pulse < 100.    - MODERATE: SOB at rest, SOB with minimal exertion and prefers to sit, cannot lie down flat, speaks in phrases, mild retractions, audible wheezing, pulse 100-120.    - SEVERE: Very SOB at rest, speaks in single words, struggling to breathe, sitting hunched forward, retractions, pulse > 120      Moderate right now 5. RECURRENT SYMPTOM: "Have you had difficulty breathing before?" If so, ask: "When was the last time?" and "What  happened that time?"      No have not 6. CARDIAC HISTORY: "Do you have any history of heart disease?" (e.g., heart attack, angina, bypass surgery, angioplasty)      no 7. LUNG HISTORY: "Do you have any history of lung disease?"  (e.g., pulmonary embolus, asthma, emphysema)     no 8. CAUSE: "What do you think is causing the breathing problem?"      Not sure 9. OTHER SYMPTOMS: "Do you have any other symptoms? (e.g., dizziness, runny nose, cough, chest pain, fever)     Has a runny nose all the time 10. PREGNANCY: "Is there any chance you are pregnant?" "When was your last menstrual period?"       n/a 11. TRAVEL: "Have you traveled out of the country in the last month?" (e.g., travel history, exposures)       no  Protocols used: BREATHING DIFFICULTY-A-AH

## 2019-12-28 NOTE — Telephone Encounter (Signed)
Called and spoke to the son in law. He stated the patient is doing much better. The patient keeps the heat in his home really warm,,, they have lowered the temp and he is doing better. Breathing is better in the mornings, but worsens in the afternoon.  Schedule in office appt for tomorrow 12/29/2019. If anything changes he will call and let us know/or change to a Telephone call.

## 2019-12-28 NOTE — Telephone Encounter (Signed)
Called left message to call back, 

## 2019-12-29 ENCOUNTER — Other Ambulatory Visit: Payer: Self-pay

## 2019-12-29 ENCOUNTER — Ambulatory Visit (INDEPENDENT_AMBULATORY_CARE_PROVIDER_SITE_OTHER): Payer: Medicare Other | Admitting: Family Medicine

## 2019-12-29 ENCOUNTER — Inpatient Hospital Stay (HOSPITAL_BASED_OUTPATIENT_CLINIC_OR_DEPARTMENT_OTHER)
Admission: EM | Admit: 2019-12-29 | Discharge: 2020-01-07 | DRG: 177 | Disposition: A | Discharge status: Skilled Nursing Facility | Payer: Medicare Other | Attending: Internal Medicine | Admitting: Internal Medicine

## 2019-12-29 ENCOUNTER — Emergency Department (HOSPITAL_BASED_OUTPATIENT_CLINIC_OR_DEPARTMENT_OTHER): Payer: Medicare Other

## 2019-12-29 ENCOUNTER — Encounter: Payer: Self-pay | Admitting: Family Medicine

## 2019-12-29 VITALS — BP 152/84 | Temp 97.9°F | Wt 197.0 lb

## 2019-12-29 DIAGNOSIS — E039 Hypothyroidism, unspecified: Secondary | ICD-10-CM | POA: Diagnosis present

## 2019-12-29 DIAGNOSIS — E1169 Type 2 diabetes mellitus with other specified complication: Secondary | ICD-10-CM

## 2019-12-29 DIAGNOSIS — L89321 Pressure ulcer of left buttock, stage 1: Secondary | ICD-10-CM | POA: Diagnosis present

## 2019-12-29 DIAGNOSIS — J9601 Acute respiratory failure with hypoxia: Secondary | ICD-10-CM

## 2019-12-29 DIAGNOSIS — I509 Heart failure, unspecified: Secondary | ICD-10-CM

## 2019-12-29 DIAGNOSIS — Z8719 Personal history of other diseases of the digestive system: Secondary | ICD-10-CM

## 2019-12-29 DIAGNOSIS — Z79890 Hormone replacement therapy (postmenopausal): Secondary | ICD-10-CM

## 2019-12-29 DIAGNOSIS — Z8673 Personal history of transient ischemic attack (TIA), and cerebral infarction without residual deficits: Secondary | ICD-10-CM

## 2019-12-29 DIAGNOSIS — R0602 Shortness of breath: Secondary | ICD-10-CM

## 2019-12-29 DIAGNOSIS — Z818 Family history of other mental and behavioral disorders: Secondary | ICD-10-CM

## 2019-12-29 DIAGNOSIS — E1165 Type 2 diabetes mellitus with hyperglycemia: Secondary | ICD-10-CM | POA: Diagnosis not present

## 2019-12-29 DIAGNOSIS — E785 Hyperlipidemia, unspecified: Secondary | ICD-10-CM | POA: Diagnosis present

## 2019-12-29 DIAGNOSIS — I5032 Chronic diastolic (congestive) heart failure: Secondary | ICD-10-CM | POA: Diagnosis not present

## 2019-12-29 DIAGNOSIS — L8990 Pressure ulcer of unspecified site, unspecified stage: Secondary | ICD-10-CM | POA: Insufficient documentation

## 2019-12-29 DIAGNOSIS — T380X5A Adverse effect of glucocorticoids and synthetic analogues, initial encounter: Secondary | ICD-10-CM | POA: Diagnosis not present

## 2019-12-29 DIAGNOSIS — K219 Gastro-esophageal reflux disease without esophagitis: Secondary | ICD-10-CM | POA: Diagnosis present

## 2019-12-29 DIAGNOSIS — U071 COVID-19: Secondary | ICD-10-CM | POA: Diagnosis not present

## 2019-12-29 DIAGNOSIS — F0390 Unspecified dementia without behavioral disturbance: Secondary | ICD-10-CM | POA: Diagnosis present

## 2019-12-29 DIAGNOSIS — J1282 Pneumonia due to coronavirus disease 2019: Secondary | ICD-10-CM

## 2019-12-29 DIAGNOSIS — N400 Enlarged prostate without lower urinary tract symptoms: Secondary | ICD-10-CM | POA: Diagnosis present

## 2019-12-29 DIAGNOSIS — I110 Hypertensive heart disease with heart failure: Secondary | ICD-10-CM | POA: Diagnosis present

## 2019-12-29 DIAGNOSIS — L89311 Pressure ulcer of right buttock, stage 1: Secondary | ICD-10-CM | POA: Diagnosis present

## 2019-12-29 DIAGNOSIS — I639 Cerebral infarction, unspecified: Secondary | ICD-10-CM | POA: Diagnosis present

## 2019-12-29 DIAGNOSIS — M7989 Other specified soft tissue disorders: Secondary | ICD-10-CM | POA: Diagnosis present

## 2019-12-29 DIAGNOSIS — I10 Essential (primary) hypertension: Secondary | ICD-10-CM | POA: Diagnosis present

## 2019-12-29 DIAGNOSIS — Z811 Family history of alcohol abuse and dependence: Secondary | ICD-10-CM

## 2019-12-29 DIAGNOSIS — D509 Iron deficiency anemia, unspecified: Secondary | ICD-10-CM | POA: Diagnosis present

## 2019-12-29 DIAGNOSIS — Z87891 Personal history of nicotine dependence: Secondary | ICD-10-CM

## 2019-12-29 DIAGNOSIS — Z888 Allergy status to other drugs, medicaments and biological substances status: Secondary | ICD-10-CM

## 2019-12-29 DIAGNOSIS — E669 Obesity, unspecified: Secondary | ICD-10-CM

## 2019-12-29 DIAGNOSIS — Z79899 Other long term (current) drug therapy: Secondary | ICD-10-CM

## 2019-12-29 DIAGNOSIS — Z885 Allergy status to narcotic agent status: Secondary | ICD-10-CM

## 2019-12-29 HISTORY — DX: Acute respiratory failure with hypoxia: J96.01

## 2019-12-29 HISTORY — DX: Heart failure, unspecified: I50.9

## 2019-12-29 LAB — CBC WITH DIFFERENTIAL/PLATELET
Abs Immature Granulocytes: 0.07 10*3/uL (ref 0.00–0.07)
Basophils Absolute: 0.1 10*3/uL (ref 0.0–0.1)
Basophils Relative: 1 %
Eosinophils Absolute: 0.2 10*3/uL (ref 0.0–0.5)
Eosinophils Relative: 2 %
HCT: 41.3 % (ref 39.0–52.0)
Hemoglobin: 12.6 g/dL — ABNORMAL LOW (ref 13.0–17.0)
Immature Granulocytes: 1 %
Lymphocytes Relative: 21 %
Lymphs Abs: 2 10*3/uL (ref 0.7–4.0)
MCH: 29.4 pg (ref 26.0–34.0)
MCHC: 30.5 g/dL (ref 30.0–36.0)
MCV: 96.5 fL (ref 80.0–100.0)
Monocytes Absolute: 0.9 10*3/uL (ref 0.1–1.0)
Monocytes Relative: 9 %
Neutro Abs: 6.1 10*3/uL (ref 1.7–7.7)
Neutrophils Relative %: 66 %
Platelets: 457 10*3/uL — ABNORMAL HIGH (ref 150–400)
RBC: 4.28 MIL/uL (ref 4.22–5.81)
RDW: 13.3 % (ref 11.5–15.5)
WBC: 9.4 10*3/uL (ref 4.0–10.5)
nRBC: 0 % (ref 0.0–0.2)

## 2019-12-29 LAB — BRAIN NATRIURETIC PEPTIDE: B Natriuretic Peptide: 349 pg/mL — ABNORMAL HIGH (ref 0.0–100.0)

## 2019-12-29 LAB — COMPREHENSIVE METABOLIC PANEL
ALT: 19 U/L (ref 0–44)
AST: 15 U/L (ref 15–41)
Albumin: 2.9 g/dL — ABNORMAL LOW (ref 3.5–5.0)
Alkaline Phosphatase: 51 U/L (ref 38–126)
Anion gap: 6 (ref 5–15)
BUN: 20 mg/dL (ref 8–23)
CO2: 37 mmol/L — ABNORMAL HIGH (ref 22–32)
Calcium: 8.4 mg/dL — ABNORMAL LOW (ref 8.9–10.3)
Chloride: 95 mmol/L — ABNORMAL LOW (ref 98–111)
Creatinine, Ser: 1.13 mg/dL (ref 0.61–1.24)
GFR calc Af Amer: >60 mL/min (ref >60)
GFR calc non Af Amer: 58 mL/min — ABNORMAL LOW (ref >60)
Glucose, Bld: 115 mg/dL — ABNORMAL HIGH (ref 70–99)
Potassium: 4.6 mmol/L (ref 3.5–5.1)
Sodium: 138 mmol/L (ref 135–145)
Total Bilirubin: 0.4 mg/dL (ref 0.3–1.2)
Total Protein: 7.1 g/dL (ref 6.5–8.1)

## 2019-12-29 LAB — SARS CORONAVIRUS 2 AG (30 MIN TAT): SARS Coronavirus 2 Ag: NEGATIVE

## 2019-12-29 LAB — SARS CORONAVIRUS 2 BY RT PCR (HOSPITAL ORDER, PERFORMED IN ~~LOC~~ HOSPITAL LAB): SARS Coronavirus 2: POSITIVE — AB

## 2019-12-29 MED ORDER — SODIUM CHLORIDE 0.9 % IV SOLN
100.0000 mg | INTRAVENOUS | Status: AC
  Administered 2019-12-30: 100 mg via INTRAVENOUS
  Filled 2019-12-29 (×2): qty 20

## 2019-12-29 MED ORDER — SODIUM CHLORIDE 0.9 % IV SOLN
100.0000 mg | Freq: Every day | INTRAVENOUS | Status: DC
  Administered 2019-12-30: 100 mg via INTRAVENOUS

## 2019-12-29 MED ORDER — DEXAMETHASONE SODIUM PHOSPHATE 10 MG/ML IJ SOLN
10.0000 mg | Freq: Once | INTRAMUSCULAR | Status: AC
  Administered 2019-12-30: 10 mg via INTRAVENOUS
  Filled 2019-12-29: qty 1

## 2019-12-29 MED ORDER — FUROSEMIDE 10 MG/ML IJ SOLN
60.0000 mg | Freq: Once | INTRAMUSCULAR | Status: AC
  Administered 2019-12-29: 60 mg via INTRAVENOUS
  Filled 2019-12-29: qty 6

## 2019-12-29 MED ORDER — MEMANTINE HCL 5 MG PO TABS
5.0000 mg | ORAL_TABLET | Freq: Two times a day (BID) | ORAL | Status: DC
  Filled 2019-12-29 (×2): qty 1

## 2019-12-29 MED ORDER — LEVOTHYROXINE SODIUM 75 MCG PO TABS: 75.0000 ug | ORAL_TABLET | Freq: Every day | ORAL | Status: DC

## 2019-12-29 NOTE — ED Notes (Signed)
Pt nasal cannula increased to 6L.

## 2019-12-29 NOTE — ED Triage Notes (Addendum)
Per son pt with "rattling" cough, sob-sx started 3 weeks ago-was seen and sent from PCP in the building-taken to tx room via w/c-chart note reads pt with low O2 sats

## 2019-12-29 NOTE — ED Notes (Signed)
Pt has had several episodes of urinary incontinence.

## 2019-12-29 NOTE — ED Notes (Signed)
Placed on 2lpm [place] due to low SPO2 in triage and increase WOB. Noticed upper audible expiratory wheeze at times.

## 2019-12-29 NOTE — ED Provider Notes (Signed)
[facility] EMERGENCY DEPARTMENT Provider Note   CSN: [id number] Arrival date & time: 12/29/19  1514     History Chief Complaint  Patient presents with  . Cough  . Shortness of Breath    Douglas Grant is a 86 y.o. male.  HPI      86 year old male with history of hypertension, hyperlipidemia, diabetes, dementia, CVA who presents with concern for shortness of breath from PCPs office where he was found to have hypoxia to the mid 80s on room air.  Son-in-law is at bedside and assisting with history.  Reports he has had increasing shortness of breath over the last 3 weeks.  Reports it has progressively been getting worse.  Son-in-law reports that his lower extremity swelling has also increased.  Reports that the patient had said that he could not sleep at night due to shortness of breath when he was laying down.  The patient does report that he feels somewhat better when he sits up compared to when he lays down.  Has no prior history of CHF.  No history of COPD or asthma although he was a smoker for many years.  Has never been on inhalers.  Denies fevers, sick contacts.  Reports some mild cough, but largest concern of shortness of breath. No n/v/fevers/diarrhea/body aches.  He has chronic nasal congestion which is unchanged.   Past Medical History:  Diagnosis Date  . Arthritis   . Benign localized prostatic hyperplasia with lower urinary tract symptoms (LUTS)   . DOE (dyspnea on exertion)   . Full dentures   . Gait disturbance   . GERD (gastroesophageal reflux disease)   . Heart murmur   . History of CVA in adulthood 03/2011   acute infarct right lateral thalamus posteriorly in internal capsule,  per pt no residuals  . Hyperlipidemia   . Hypertension   . Hypothyroidism   . Iron deficiency anemia   . Memory difficulty   . Personal history of colonic polyps   . Type 2 diabetes, diet controlled [facility])     Patient Active Problem List   Diagnosis Date Noted  . CHF  (congestive heart failure) ([place]) 12/29/2019  . Acute respiratory failure with hypoxia ([place]) 12/29/2019  . Dementia without behavioral disturbance ([place]) 07/29/2019  . Seasonal allergies 05/01/2018  . Intention tremor 03/20/2018  . Hallucination 03/20/2018  . Rash and nonspecific skin eruption 09/16/2016  . Onychomycosis 10/06/2015  . Pain in lower limb 10/06/2015  . Wart 01/18/2014  . Wheezing 12/22/2013  . Obesity (BMI 30-39.9) 11/16/2013  . Paraspinal muscle spasm 08/06/2013  . Stroke ([place]) 04/24/2011  . B12 deficiency 04/24/2011  . BACK PAIN 07/28/2009  . Headache(784.0) 02/18/2009  . Candidiasis of other urogenital sites 07/13/2008  . Cervicalgia 06/15/2008  . PRURITUS 11/25/2007  . Other urinary incontinence 11/25/2007  . BENIGN PROSTATIC HYPERTROPHY, HX OF 11/25/2007  . HIP PAIN, LEFT 11/10/2007  . Hypothyroidism 08/21/2007  . OBESITY 08/21/2007  . ATHEROSCLEROSIS, CORONARY, NATIVE ARTERY 08/21/2007  . VENTRICULAR HYPERTROPHY, LEFT 08/21/2007  . GERD 08/21/2007  . ERECTILE DYSFUNCTION, ORGANIC 08/21/2007  . COLONIC POLYPS, HX OF 08/21/2007  . SPONDYLOSIS, CERVICAL, WITH RADICULOPATHY 08/11/2007  . Carpal tunnel syndrome 07/28/2007  . SPRAIN/STRAIN, SHOULDER/ARM NEC 06/26/2007  . Controlled type 2 diabetes mellitus with complication, without long-term current use of insulin ([place]) 02/10/2007  . Hyperlipidemia LDL goal <100 02/10/2007  . Essential hypertension 02/10/2007    Past Surgical History:  Procedure Laterality Date  . CATARACT EXTRACTION W/ INTRAOCULAR LENS  IMPLANT, BILATERAL  2017  . CIRCUMCISION N/A 10/02/2018   Procedure: CIRCUMCISION ADULT;  Surgeon: [name], MD;  Location: [facility] ORS;  Service: Urology;  Laterality: N/A;  . LAPAROSCOPIC CHOLECYSTECTOMY  early 2000s       Family History  Problem Relation Age of Onset  . Depression Mother   . Alcohol abuse Father   . Alcohol abuse Brother     Social History   Tobacco Use  . Smoking status:  Former Smoker    Years: 30.00    Types: Cigarettes    Quit date: 04/18/1985    Years since quitting: 34.7  . Smokeless tobacco: Never Used  Substance Use Topics  . Alcohol use: No    Alcohol/week: 0.0 standard drinks  . Drug use: Never    Home Medications Prior to Admission medications   Medication Sig Start Date End Date Taking? Authorizing Provider  alfuzosin (UROXATRAL) 10 MG 24 hr tablet Take 1 tablet (10 mg total) by mouth daily. 12/29/18   Shelda Pal, DO  cetirizine (ZYRTEC) 10 MG tablet Take 1 tablet (10 mg total) by mouth daily. Patient taking differently: Take 10 mg by mouth every morning.  05/01/18   Shelda Pal, DO  donepezil (ARICEPT) 10 MG tablet TAKE 1 TABLET AT BEDTIME 11/26/18   Wendling, Crosby Oyster, DO  Ferrous Sulfate Dried (EQ SLOW-RELEASE IRON) 45 MG TBCR Take 45 mg by mouth 2 (two) times daily.    [provider]  finasteride (PROSCAR) 5 MG tablet TAKE 1 TABLET DAILY 03/16/19   Wendling, Crosby Oyster, DO  levothyroxine (SYNTHROID, LEVOTHROID) 75 MCG tablet TAKE 1 TABLET DAILY 10/16/18   Shelda Pal, DO  memantine (NAMENDA) 5 MG tablet Take 1 tablet (5 mg total) by mouth 2 (two) times daily. 1 tab twice daily. 09/01/19   Shelda Pal, DO  nystatin-triamcinolone ointment [place]) Apply 1 application topically 2 (two) times daily.    [provider]  omeprazole (PRILOSEC) 40 MG capsule TAKE 1 CAPSULE DAILY 05/08/19   Shelda Pal, DO  propranolol ER (INDERAL LA) 60 MG 24 hr capsule TAKE 1 CAPSULE DAILY 12/07/19   Shelda Pal, DO  QUEtiapine (SEROQUEL) 100 MG tablet TAKE 1 TABLET AT BEDTIME 12/15/18   Shelda Pal, DO  venlafaxine XR (EFFEXOR-XR) 75 MG 24 hr capsule TAKE 1 CAPSULE DAILY WITH BREAKFAST 12/15/18   Wendling, Crosby Oyster, DO    Allergies    Atenolol, Clonidine hydrochloride, Prednisone, and Verapamil  Review of Systems   Review of Systems  Constitutional:  Positive for fatigue. Negative for appetite change and fever.  HENT: Positive for congestion (chronic unchanged).   Respiratory: Positive for cough and shortness of breath.   Cardiovascular: Negative for chest pain.  Gastrointestinal: Negative for abdominal pain, diarrhea, nausea and vomiting.  Genitourinary: Negative for dysuria.  Musculoskeletal: Negative for back pain.  Skin: Negative for rash.  Neurological: Negative for light-headedness and headaches.    Physical Exam Updated Vital Signs BP (!) 186/111   Pulse 66   Temp 98.2 F (36.8 C) (Oral)   Resp 18   SpO2 97%   Physical Exam Vitals and nursing note reviewed.  Constitutional:      General: He is not in acute distress.    Appearance: He is well-developed. He is not diaphoretic.  HENT:     Head: Normocephalic and atraumatic.  Eyes:     Conjunctiva/sclera: Conjunctivae normal.  Neck:     Vascular: JVD present.  Cardiovascular:  Rate and Rhythm: Normal rate and regular rhythm.     Heart sounds: Normal heart sounds. No murmur. No friction rub. No gallop.   Pulmonary:     Effort: Pulmonary effort is normal. No respiratory distress.     Breath sounds: Rales present. No wheezing.  Abdominal:     General: There is no distension.     Palpations: Abdomen is soft.     Tenderness: There is no abdominal tenderness. There is no guarding.  Musculoskeletal:     Cervical back: Normal range of motion.     Right lower leg: Edema present.     Left lower leg: Edema present.  Skin:    General: Skin is warm and dry.  Neurological:     Mental Status: He is alert and oriented to person, place, and time.     ED Results / Procedures / Treatments   Labs (all labs ordered are listed, but only abnormal results are displayed) Labs Reviewed  SARS CORONAVIRUS 2 BY RT PCR ([place]) - Abnormal; Notable for the following components:      Result Value   SARS Coronavirus 2 POSITIVE (*)     All other components within normal limits  BRAIN NATRIURETIC PEPTIDE - Abnormal; Notable for the following components:   B Natriuretic Peptide 349.0 (*)    All other components within normal limits  CBC WITH DIFFERENTIAL/PLATELET - Abnormal; Notable for the following components:   Hemoglobin 12.6 (*)    Platelets 457 (*)    All other components within normal limits  COMPREHENSIVE METABOLIC PANEL - Abnormal; Notable for the following components:   Chloride 95 (*)    CO2 37 (*)    Glucose, Bld 115 (*)    Calcium 8.4 (*)    Albumin 2.9 (*)    GFR calc non Af Amer 58 (*)    All other components within normal limits  D-DIMER, QUANTITATIVE (NOT AT [facility]) - Abnormal; Notable for the following components:   D-Dimer, Quant 3.08 (*)    All other components within normal limits  SARS CORONAVIRUS 2 AG (30 MIN TAT)  CULTURE, BLOOD (ROUTINE X 2)  CULTURE, BLOOD (ROUTINE X 2)  LACTIC ACID, PLASMA  LACTIC ACID, PLASMA  PROCALCITONIN  LACTATE DEHYDROGENASE  FERRITIN  TRIGLYCERIDES  FIBRINOGEN  C-REACTIVE PROTEIN    EKG EKG Interpretation  Date/Time:  Tuesday December 29 2019 15:37:56 EST Ventricular Rate:  58 PR Interval:    QRS Duration: 100 QT Interval:  474 QTC Calculation: 466 R Axis:   27 Text Interpretation: Sinus rhythm Atrial premature complex No significant change since last tracing Confirmed by Gareth Morgan [phone]) on 12/29/2019 7:46:47 PM   Radiology DG Chest Portable 1 View  Result Date: 12/29/2019 CLINICAL DATA:  Shortness of breath, COVID-19 negativity EXAM: PORTABLE CHEST 1 VIEW COMPARISON:  07/26/2017 FINDINGS: Cardiac shadow is at the upper limits of normal in size. Aortic calcifications are noted. Patchy infiltrates are noted throughout the right lung as well as in the left lung base consistent with a multifocal pneumonia. No bony abnormality is seen. No sizable effusion is noted. IMPRESSION: Bilateral infiltrates right greater than left. Electronically Signed   By:  Inez Catalina M.D.   On: 12/29/2019 17:46    Procedures .Critical Care Performed by: Gareth Morgan, MD Authorized by: Gareth Morgan, MD   Critical care provider statement:    Critical care time (minutes):  30   Critical care was time spent personally by me  on the following activities:  Evaluation of patient's response to treatment, examination of patient, ordering and performing treatments and interventions, ordering and review of laboratory studies, ordering and review of radiographic studies, pulse oximetry, re-evaluation of patient's condition, obtaining history from patient or surrogate and review of old charts   (including critical care time)  Medications Ordered in ED Medications  levothyroxine (SYNTHROID) tablet 75 mcg (has no administration in time range)  memantine (NAMENDA) tablet 5 mg (has no administration in time range)  dexamethasone (DECADRON) injection 10 mg (has no administration in time range)  remdesivir 100 mg in sodium chloride 0.9 % 100 mL IVPB (100 mg Intravenous New Bag/Given 12/30/19 0027)  remdesivir 100 mg in sodium chloride 0.9 % 100 mL IVPB (has no administration in time range)  furosemide (LASIX) injection 60 mg (60 mg Intravenous Given 12/29/19 1936)    ED Course  I have reviewed the triage vital signs and the nursing notes.  Pertinent labs & imaging results that were available during my care of the patient were reviewed by me and considered in my medical decision making (see chart for details).    MDM Rules/Calculators/A&P                      86 year old male with history of hypertension, hyperlipidemia, diabetes, dementia, CVA who presents with concern for shortness of breath from PCPs office where he was found to have hypoxia to the mid 80s on room air.  Initial history and exam concerning for possible new onset CHF with progressive dyspnea on exertion, orthopnea, JVD.  I personally reviewed CXR and given history, exam and XR believed it to appear  to be asymmetric edema, BNP in 300s.  Given lasix with good UOP.  No fevers, no significant cough, progressive symptoms over weeks and felt unlikely to be infection.   COVID antigen test negative and 2hr test sent given dyspnea and returned positive. While CHF might be playing a role given findings on hx, it is more likely symptoms are consistent with COVID 19 infection given positive PCR.   Pt initially improved on 2L of O2, however began to have sundowning and agitation related to underlying dementia and would be trying to get up out of bed and take off of his O2. Noted to have desaturation with exertion while being agitated and was placed on 6L, however at this time is resting on 2L and stable and still feel he is appropriate for COVID bed at [facility] labs, decadron, remdesevir.     Final Clinical Impression(s) / ED Diagnoses Final diagnoses:  Acute respiratory failure with hypoxia ([place])  COVID-19    Rx / DC Orders ED Discharge Orders    None       Gareth Morgan, MD 12/30/19 [phone]

## 2019-12-29 NOTE — Plan of Care (Signed)
86 yo male with hypothyroidism, hypertension, hyperlipidemia, dm2, h/o CVA, Iron deficiency anemia, presents with c/o dyspnea, has multifocal pneumonia, and Pafib, and covid-19 positive  CXR IMPRESSION: Bilateral infiltrates right greater than left.  Na 138, K 4.6,  Bun 20, Creatinine 1.13 Ast 15, Alt 19 Alb 2.9  Wbc 9.4, Hgb 12.6, Plt 457 BNP 349    ED requesting admission for covid-19 pneumonia and Afib, (unclear if this is new or old)

## 2019-12-29 NOTE — Progress Notes (Signed)
Chief Complaint  Patient presents with  . Shortness of Breath    for several weeks    Subjective: Patient is a 86 y.o. male here for cough.  He is here with his son-in-law.  The patient has been having cough and upper congestion for the past 3 weeks.  Last week, it seems he was having quite a bit of difficulty breathing.  He is eating a little bit better today.  No significant weight changes.  He is not wheezing and has no history of COPD or asthma.  No recent chemical exposure or medication changes.  No dietary changes.  No sick contacts.  He lives alone but his son-in-law and daughter check on him frequently.  They have not been ill.   ROS:  Constitutional: No fevers Lungs: + Cough  Past Medical History:  Diagnosis Date  . Arthritis   . Benign localized prostatic hyperplasia with lower urinary tract symptoms (LUTS)   . DOE (dyspnea on exertion)   . Full dentures   . Gait disturbance   . GERD (gastroesophageal reflux disease)   . Heart murmur   . History of CVA in adulthood 03/2011   acute infarct right lateral thalamus posteriorly in internal capsule,  per pt no residuals  . Hyperlipidemia   . Hypertension   . Hypothyroidism   . Iron deficiency anemia   . Memory difficulty   . Personal history of colonic polyps   . Type 2 diabetes, diet controlled (HCC)     Objective: BP (!) 152/84 (BP Location: Left Arm, Patient Position: Sitting, Cuff Size: Large)   Temp 97.9 F (36.6 C) (Temporal)   Wt 197 lb (89.4 kg)   BMI 28.67 kg/m  General: Awake, appears stated age Heart: RRR, 2+ lower extremity edema pitting bilaterally tapering at the mid tibia Lungs: Distant breath sounds throughout, faint crackles in the left lower lung field, no wheezing. No accessory muscle use Psych:normal affect and mood  Assessment and Plan: Acute respiratory failure with hypoxia (HCC)  Patient was saturating in the mid 80s at rest.  He would desaturate to the mid 70s upon ambulation.  I thought  we might get lucky and hear some wheezing and be able to get his saturations up with a breathing treatment but that does not seem to be the case.  I spoke with the ER team and informed him of his imminent arrival.  Our team placed him in a wheelchair and brought him down.  The patient and his son-in-law were in agreement with the plan.  It is highly likely he will be admitted and I will see him upon follow-up.  [place], DO 12/29/19  3:16 PM

## 2019-12-29 NOTE — ED Notes (Signed)
Pt resting comfortably on the stretcher at this time. Condom cath applied.

## 2019-12-29 NOTE — ED Notes (Signed)
Attempted IV in right FA and left FA unsuccessful.

## 2019-12-29 NOTE — ED Notes (Signed)
Charolette Child (daughter of pt) - [phone]

## 2019-12-29 NOTE — ED Notes (Signed)
RT and Rosealee Albee, RN at [facility] given to complete triage

## 2019-12-29 NOTE — ED Notes (Signed)
Pt attempting to get comfortable on stretcher- nasal cannula out of pt nostrils- SpO2 at 78%. RN to bedside to assist patient and nasal cannula reapplied- increased to 4L. Pt SpO2 95%. Pt instructed to call for help and keep nasal cannula in.

## 2019-12-29 NOTE — ED Notes (Signed)
RN spoke with pt daughter [name].

## 2019-12-30 ENCOUNTER — Encounter (HOSPITAL_COMMUNITY): Payer: Self-pay | Admitting: Internal Medicine

## 2019-12-30 ENCOUNTER — Inpatient Hospital Stay (HOSPITAL_COMMUNITY): Payer: Medicare Other

## 2019-12-30 ENCOUNTER — Telehealth: Payer: Self-pay | Admitting: Family Medicine

## 2019-12-30 DIAGNOSIS — E119 Type 2 diabetes mellitus without complications: Secondary | ICD-10-CM | POA: Insufficient documentation

## 2019-12-30 DIAGNOSIS — Z885 Allergy status to narcotic agent status: Secondary | ICD-10-CM | POA: Diagnosis not present

## 2019-12-30 DIAGNOSIS — Z8719 Personal history of other diseases of the digestive system: Secondary | ICD-10-CM | POA: Diagnosis not present

## 2019-12-30 DIAGNOSIS — N400 Enlarged prostate without lower urinary tract symptoms: Secondary | ICD-10-CM | POA: Diagnosis not present

## 2019-12-30 DIAGNOSIS — I110 Hypertensive heart disease with heart failure: Secondary | ICD-10-CM | POA: Diagnosis present

## 2019-12-30 DIAGNOSIS — E1169 Type 2 diabetes mellitus with other specified complication: Secondary | ICD-10-CM | POA: Diagnosis not present

## 2019-12-30 DIAGNOSIS — E7800 Pure hypercholesterolemia, unspecified: Secondary | ICD-10-CM

## 2019-12-30 DIAGNOSIS — L89311 Pressure ulcer of right buttock, stage 1: Secondary | ICD-10-CM | POA: Diagnosis present

## 2019-12-30 DIAGNOSIS — J1282 Pneumonia due to coronavirus disease 2019: Secondary | ICD-10-CM | POA: Diagnosis present

## 2019-12-30 DIAGNOSIS — D509 Iron deficiency anemia, unspecified: Secondary | ICD-10-CM | POA: Diagnosis present

## 2019-12-30 DIAGNOSIS — U071 COVID-19: Secondary | ICD-10-CM | POA: Diagnosis not present

## 2019-12-30 DIAGNOSIS — R4182 Altered mental status, unspecified: Secondary | ICD-10-CM | POA: Diagnosis not present

## 2019-12-30 DIAGNOSIS — R41841 Cognitive communication deficit: Secondary | ICD-10-CM | POA: Diagnosis not present

## 2019-12-30 DIAGNOSIS — L89001 Pressure ulcer of unspecified elbow, stage 1: Secondary | ICD-10-CM | POA: Diagnosis not present

## 2019-12-30 DIAGNOSIS — I10 Essential (primary) hypertension: Secondary | ICD-10-CM | POA: Diagnosis not present

## 2019-12-30 DIAGNOSIS — E1165 Type 2 diabetes mellitus with hyperglycemia: Secondary | ICD-10-CM | POA: Diagnosis not present

## 2019-12-30 DIAGNOSIS — I674 Hypertensive encephalopathy: Secondary | ICD-10-CM | POA: Diagnosis not present

## 2019-12-30 DIAGNOSIS — I5032 Chronic diastolic (congestive) heart failure: Secondary | ICD-10-CM | POA: Diagnosis not present

## 2019-12-30 DIAGNOSIS — F39 Unspecified mood [affective] disorder: Secondary | ICD-10-CM | POA: Diagnosis not present

## 2019-12-30 DIAGNOSIS — Z8673 Personal history of transient ischemic attack (TIA), and cerebral infarction without residual deficits: Secondary | ICD-10-CM

## 2019-12-30 DIAGNOSIS — E785 Hyperlipidemia, unspecified: Secondary | ICD-10-CM | POA: Diagnosis not present

## 2019-12-30 DIAGNOSIS — L8990 Pressure ulcer of unspecified site, unspecified stage: Secondary | ICD-10-CM | POA: Insufficient documentation

## 2019-12-30 DIAGNOSIS — M6281 Muscle weakness (generalized): Secondary | ICD-10-CM | POA: Diagnosis not present

## 2019-12-30 DIAGNOSIS — I639 Cerebral infarction, unspecified: Secondary | ICD-10-CM | POA: Diagnosis not present

## 2019-12-30 DIAGNOSIS — E039 Hypothyroidism, unspecified: Secondary | ICD-10-CM | POA: Diagnosis present

## 2019-12-30 DIAGNOSIS — L89321 Pressure ulcer of left buttock, stage 1: Secondary | ICD-10-CM | POA: Diagnosis present

## 2019-12-30 DIAGNOSIS — K219 Gastro-esophageal reflux disease without esophagitis: Secondary | ICD-10-CM | POA: Diagnosis present

## 2019-12-30 DIAGNOSIS — E038 Other specified hypothyroidism: Secondary | ICD-10-CM | POA: Diagnosis not present

## 2019-12-30 DIAGNOSIS — T380X5A Adverse effect of glucocorticoids and synthetic analogues, initial encounter: Secondary | ICD-10-CM | POA: Diagnosis not present

## 2019-12-30 DIAGNOSIS — J9601 Acute respiratory failure with hypoxia: Secondary | ICD-10-CM

## 2019-12-30 DIAGNOSIS — Z87891 Personal history of nicotine dependence: Secondary | ICD-10-CM | POA: Diagnosis not present

## 2019-12-30 DIAGNOSIS — R443 Hallucinations, unspecified: Secondary | ICD-10-CM | POA: Diagnosis not present

## 2019-12-30 DIAGNOSIS — D473 Essential (hemorrhagic) thrombocythemia: Secondary | ICD-10-CM | POA: Diagnosis not present

## 2019-12-30 DIAGNOSIS — Z818 Family history of other mental and behavioral disorders: Secondary | ICD-10-CM | POA: Diagnosis not present

## 2019-12-30 DIAGNOSIS — F0280 Dementia in other diseases classified elsewhere without behavioral disturbance: Secondary | ICD-10-CM | POA: Diagnosis not present

## 2019-12-30 DIAGNOSIS — Z888 Allergy status to other drugs, medicaments and biological substances status: Secondary | ICD-10-CM | POA: Diagnosis not present

## 2019-12-30 DIAGNOSIS — F0390 Unspecified dementia without behavioral disturbance: Secondary | ICD-10-CM | POA: Diagnosis present

## 2019-12-30 DIAGNOSIS — Z811 Family history of alcohol abuse and dependence: Secondary | ICD-10-CM | POA: Diagnosis not present

## 2019-12-30 DIAGNOSIS — R2689 Other abnormalities of gait and mobility: Secondary | ICD-10-CM | POA: Diagnosis not present

## 2019-12-30 DIAGNOSIS — E118 Type 2 diabetes mellitus with unspecified complications: Secondary | ICD-10-CM | POA: Diagnosis not present

## 2019-12-30 DIAGNOSIS — L89151 Pressure ulcer of sacral region, stage 1: Secondary | ICD-10-CM | POA: Diagnosis not present

## 2019-12-30 DIAGNOSIS — Z79899 Other long term (current) drug therapy: Secondary | ICD-10-CM | POA: Diagnosis not present

## 2019-12-30 DIAGNOSIS — Z79890 Hormone replacement therapy (postmenopausal): Secondary | ICD-10-CM | POA: Diagnosis not present

## 2019-12-30 HISTORY — DX: Pressure ulcer of unspecified site, unspecified stage: L89.90

## 2019-12-30 HISTORY — DX: Hyperlipidemia, unspecified: E78.5

## 2019-12-30 HISTORY — DX: Pneumonia due to coronavirus disease 2019: J12.82

## 2019-12-30 HISTORY — DX: Chronic diastolic (congestive) heart failure: I50.32

## 2019-12-30 HISTORY — DX: COVID-19: U07.1

## 2019-12-30 HISTORY — DX: Type 2 diabetes mellitus without complications: E11.9

## 2019-12-30 LAB — C-REACTIVE PROTEIN
CRP: 3.5 mg/dL — ABNORMAL HIGH (ref <1.0)
CRP: 2.9 mg/dL — ABNORMAL HIGH (ref <1.0)

## 2019-12-30 LAB — CBC WITH DIFFERENTIAL/PLATELET
Abs Immature Granulocytes: 0.06 10*3/uL (ref 0.00–0.07)
Basophils Absolute: 0 10*3/uL (ref 0.0–0.1)
Basophils Relative: 0 %
Eosinophils Absolute: 0 10*3/uL (ref 0.0–0.5)
Eosinophils Relative: 0 %
HCT: 42.7 % (ref 39.0–52.0)
Hemoglobin: 13.3 g/dL (ref 13.0–17.0)
Immature Granulocytes: 1 %
Lymphocytes Relative: 16 %
Lymphs Abs: 1.4 10*3/uL (ref 0.7–4.0)
MCH: 29.1 pg (ref 26.0–34.0)
MCHC: 31.1 g/dL (ref 30.0–36.0)
MCV: 93.4 fL (ref 80.0–100.0)
Monocytes Absolute: 0.6 10*3/uL (ref 0.1–1.0)
Monocytes Relative: 7 %
Neutro Abs: 6.8 10*3/uL (ref 1.7–7.7)
Neutrophils Relative %: 76 %
Platelets: 477 10*3/uL — ABNORMAL HIGH (ref 150–400)
RBC: 4.57 MIL/uL (ref 4.22–5.81)
RDW: 13.2 % (ref 11.5–15.5)
WBC: 8.9 10*3/uL (ref 4.0–10.5)
nRBC: 0 % (ref 0.0–0.2)

## 2019-12-30 LAB — COMPREHENSIVE METABOLIC PANEL
ALT: 20 U/L (ref 0–44)
AST: 18 U/L (ref 15–41)
Albumin: 2.7 g/dL — ABNORMAL LOW (ref 3.5–5.0)
Alkaline Phosphatase: 53 U/L (ref 38–126)
Anion gap: 13 (ref 5–15)
BUN: 23 mg/dL (ref 8–23)
CO2: 37 mmol/L — ABNORMAL HIGH (ref 22–32)
Calcium: 8.4 mg/dL — ABNORMAL LOW (ref 8.9–10.3)
Chloride: 91 mmol/L — ABNORMAL LOW (ref 98–111)
Creatinine, Ser: 1.13 mg/dL (ref 0.61–1.24)
GFR calc Af Amer: >60 mL/min (ref >60)
GFR calc non Af Amer: 58 mL/min — ABNORMAL LOW (ref >60)
Glucose, Bld: 143 mg/dL — ABNORMAL HIGH (ref 70–99)
Potassium: 4.3 mmol/L (ref 3.5–5.1)
Sodium: 141 mmol/L (ref 135–145)
Total Bilirubin: 0.6 mg/dL (ref 0.3–1.2)
Total Protein: 6.9 g/dL (ref 6.5–8.1)

## 2019-12-30 LAB — PHOSPHORUS: Phosphorus: 3.7 mg/dL (ref 2.5–4.6)

## 2019-12-30 LAB — FERRITIN
Ferritin: 51 ng/mL (ref 24–336)
Ferritin: 49 ng/mL (ref 24–336)

## 2019-12-30 LAB — TRIGLYCERIDES: Triglycerides: 54 mg/dL (ref <150)

## 2019-12-30 LAB — RESPIRATORY PANEL BY PCR

## 2019-12-30 LAB — FIBRINOGEN: Fibrinogen: 582 mg/dL — ABNORMAL HIGH (ref 210–475)

## 2019-12-30 LAB — LACTIC ACID, PLASMA: Lactic Acid, Venous: 1.1 mmol/L (ref 0.5–1.9)

## 2019-12-30 LAB — TROPONIN I (HIGH SENSITIVITY): Troponin I (High Sensitivity): 20 ng/L — ABNORMAL HIGH (ref <18)

## 2019-12-30 LAB — D-DIMER, QUANTITATIVE
D-Dimer, Quant: 3.08 ug/mL-FEU — ABNORMAL HIGH (ref 0.00–0.50)
D-Dimer, Quant: 3.15 ug/mL-FEU — ABNORMAL HIGH (ref 0.00–0.50)

## 2019-12-30 LAB — ABO/RH: ABO/RH(D): A POS

## 2019-12-30 LAB — PROCALCITONIN
Procalcitonin: <0.1 ng/mL
Procalcitonin: <0.1 ng/mL

## 2019-12-30 LAB — HEPATITIS B SURFACE ANTIGEN: Hepatitis B Surface Ag: NONREACTIVE

## 2019-12-30 LAB — MAGNESIUM: Magnesium: 1.7 mg/dL (ref 1.7–2.4)

## 2019-12-30 LAB — BRAIN NATRIURETIC PEPTIDE: B Natriuretic Peptide: 435.1 pg/mL — ABNORMAL HIGH (ref 0.0–100.0)

## 2019-12-30 LAB — LACTATE DEHYDROGENASE
LDH: 262 U/L — ABNORMAL HIGH (ref 98–192)
LDH: 211 U/L — ABNORMAL HIGH (ref 98–192)

## 2019-12-30 MED ORDER — PROPRANOLOL HCL ER 60 MG PO CP24
60.0000 mg | ORAL_CAPSULE | Freq: Every day | ORAL | Status: DC
  Filled 2019-12-30: qty 1

## 2019-12-30 MED ORDER — IPRATROPIUM-ALBUTEROL 20-100 MCG/ACT IN AERS
1.0000 | INHALATION_SPRAY | Freq: Four times a day (QID) | RESPIRATORY_TRACT | Status: DC
  Administered 2019-12-30 – 2020-01-07 (×28): 1 via RESPIRATORY_TRACT
  Filled 2019-12-30: qty 4

## 2019-12-30 MED ORDER — TRAMADOL HCL 50 MG PO TABS
50.0000 mg | ORAL_TABLET | Freq: Four times a day (QID) | ORAL | Status: DC | PRN
  Administered 2020-01-02 – 2020-01-03 (×2): 50 mg via ORAL
  Filled 2019-12-30 (×2): qty 1

## 2019-12-30 MED ORDER — SODIUM CHLORIDE 0.9 % IV SOLN: 100.0000 mg | Freq: Every day | INTRAVENOUS | Status: DC

## 2019-12-30 MED ORDER — QUETIAPINE FUMARATE 25 MG PO TABS
100.0000 mg | ORAL_TABLET | Freq: Every day | ORAL | Status: DC
  Administered 2019-12-30 – 2020-01-06 (×8): 100 mg via ORAL
  Filled 2019-12-30 (×8): qty 4

## 2019-12-30 MED ORDER — DONEPEZIL HCL 10 MG PO TABS
10.0000 mg | ORAL_TABLET | Freq: Every day | ORAL | Status: DC
  Administered 2019-12-30 – 2020-01-06 (×8): 10 mg via ORAL
  Filled 2019-12-30 (×9): qty 1

## 2019-12-30 MED ORDER — PANTOPRAZOLE SODIUM 40 MG PO TBEC: 40.0000 mg | DELAYED_RELEASE_TABLET | Freq: Every day | ORAL | Status: DC

## 2019-12-30 MED ORDER — HYDRALAZINE HCL 10 MG PO TABS
10.0000 mg | ORAL_TABLET | Freq: Three times a day (TID) | ORAL | Status: DC
  Administered 2019-12-30 – 2020-01-01 (×5): 10 mg via ORAL
  Filled 2019-12-30 (×9): qty 1

## 2019-12-30 MED ORDER — PROPRANOLOL HCL ER 60 MG PO CP24
60.0000 mg | ORAL_CAPSULE | Freq: Every day | ORAL | Status: DC
  Administered 2019-12-30 – 2020-01-07 (×8): 60 mg via ORAL
  Filled 2019-12-30 (×9): qty 1

## 2019-12-30 MED ORDER — PANTOPRAZOLE SODIUM 40 MG PO TBEC
40.0000 mg | DELAYED_RELEASE_TABLET | Freq: Every day | ORAL | Status: DC
  Administered 2019-12-30 – 2020-01-07 (×9): 40 mg via ORAL
  Filled 2019-12-30 (×10): qty 1

## 2019-12-30 MED ORDER — ENOXAPARIN SODIUM 40 MG/0.4ML ~~LOC~~ SOLN
40.0000 mg | SUBCUTANEOUS | Status: DC
  Administered 2019-12-30 – 2020-01-06 (×8): 40 mg via SUBCUTANEOUS
  Filled 2019-12-30 (×8): qty 0.4

## 2019-12-30 MED ORDER — MEMANTINE HCL 5 MG PO TABS
5.0000 mg | ORAL_TABLET | Freq: Two times a day (BID) | ORAL | Status: DC
  Administered 2019-12-30 – 2020-01-07 (×17): 5 mg via ORAL
  Filled 2019-12-30 (×18): qty 1

## 2019-12-30 MED ORDER — ACETAMINOPHEN 325 MG PO TABS
650.0000 mg | ORAL_TABLET | Freq: Four times a day (QID) | ORAL | Status: DC | PRN
  Administered 2020-01-05 – 2020-01-06 (×2): 650 mg via ORAL
  Filled 2019-12-30 (×2): qty 2

## 2019-12-30 MED ORDER — SODIUM CHLORIDE 0.9 % IV SOLN
100.0000 mg | Freq: Every day | INTRAVENOUS | Status: AC
  Administered 2019-12-30 – 2020-01-02 (×4): 100 mg via INTRAVENOUS
  Filled 2019-12-30 (×4): qty 20

## 2019-12-30 MED ORDER — LOPERAMIDE HCL 2 MG PO CAPS
2.0000 mg | ORAL_CAPSULE | ORAL | Status: DC | PRN
  Administered 2019-12-31: 2 mg via ORAL
  Filled 2019-12-30: qty 1

## 2019-12-30 MED ORDER — FINASTERIDE 5 MG PO TABS
5.0000 mg | ORAL_TABLET | Freq: Every day | ORAL | Status: DC
  Administered 2019-12-30 – 2020-01-07 (×9): 5 mg via ORAL
  Filled 2019-12-30 (×9): qty 1

## 2019-12-30 MED ORDER — ALFUZOSIN HCL ER 10 MG PO TB24
10.0000 mg | ORAL_TABLET | Freq: Every day | ORAL | Status: DC
  Filled 2019-12-30: qty 1

## 2019-12-30 MED ORDER — VENLAFAXINE HCL ER 75 MG PO CP24
75.0000 mg | ORAL_CAPSULE | Freq: Every day | ORAL | Status: DC
  Administered 2019-12-31 – 2020-01-07 (×8): 75 mg via ORAL
  Filled 2019-12-30 (×10): qty 1

## 2019-12-30 MED ORDER — DOCUSATE SODIUM 100 MG PO CAPS
100.0000 mg | ORAL_CAPSULE | Freq: Two times a day (BID) | ORAL | Status: DC
  Administered 2019-12-30 – 2020-01-07 (×16): 100 mg via ORAL
  Filled 2019-12-30 (×16): qty 1

## 2019-12-30 MED ORDER — LEVOTHYROXINE SODIUM 75 MCG PO TABS
75.0000 ug | ORAL_TABLET | Freq: Every day | ORAL | Status: DC
  Administered 2019-12-30 – 2020-01-07 (×9): 75 ug via ORAL
  Filled 2019-12-30 (×9): qty 1

## 2019-12-30 MED ORDER — SODIUM CHLORIDE 0.9 % IV SOLN: 200.0000 mg | Freq: Once | INTRAVENOUS | Status: DC

## 2019-12-30 MED ORDER — ASPIRIN EC 81 MG PO TBEC
81.0000 mg | DELAYED_RELEASE_TABLET | Freq: Every day | ORAL | Status: DC
  Administered 2019-12-30 – 2020-01-07 (×9): 81 mg via ORAL
  Filled 2019-12-30 (×9): qty 1

## 2019-12-30 MED ORDER — FINASTERIDE 5 MG PO TABS
5.0000 mg | ORAL_TABLET | Freq: Every day | ORAL | Status: DC
  Filled 2019-12-30: qty 1

## 2019-12-30 MED ORDER — DEXAMETHASONE SODIUM PHOSPHATE 10 MG/ML IJ SOLN
6.0000 mg | INTRAMUSCULAR | Status: DC
  Administered 2019-12-31 – 2020-01-06 (×7): 6 mg via INTRAVENOUS
  Filled 2019-12-30 (×7): qty 1

## 2019-12-30 MED ORDER — ALFUZOSIN HCL ER 10 MG PO TB24
10.0000 mg | ORAL_TABLET | Freq: Every day | ORAL | Status: DC
  Administered 2019-12-30 – 2020-01-07 (×9): 10 mg via ORAL
  Filled 2019-12-30 (×9): qty 1

## 2019-12-30 NOTE — ED Notes (Signed)
Pt SpO2 89-92% while sleeping on 2L.- increased back to 4L at this time. Pt SpO2 96%.

## 2019-12-30 NOTE — ED Notes (Signed)
Pt awake  Pt had taken oxygen out of his nose  Oxygen sats down to 88%  Oxygen replaced  Pt states he is fine at this time  Pt warm and dry  Condom cath remains in place draining yellow urine  No acute distress noted

## 2019-12-30 NOTE — Progress Notes (Signed)
[name] came by [facility] and was very upset that patient was transferred to [facility]. He states that his father had not been around anyone and has been "quarantined at home"  He is demanding of another COVID test and also told [facility] not to transfer patient until he arrived.      I informed pt. That only positive resulted patients come to [facility] for care and did not think they would retest him at this point.   I shared that I would have the attending MD call him and this would probably later on today. 1330- informed Dr. Philis Pique to contact son and provide update and answer any quesitons concerning Covid Positive.    SonBarbaraann Rondo [phone]

## 2019-12-30 NOTE — ED Notes (Signed)
Pt resting with eyes closed  O2 sats 93-94%  No acute distress noted at this time

## 2019-12-30 NOTE — Telephone Encounter (Signed)
Patient's son, Barbaraann Rondo, calling to inquire if Dr. Nani Ravens can contact ED (med center) to have patient re-tested for covid-19. He states that his father "has not been around anyone with covid" so he does not believe the positive result is accurate. He states that ED is working on moving patient to [facility] location and the family of patient does not want him to go until he is tested again. Please advise.

## 2019-12-30 NOTE — Progress Notes (Signed)
Nurse heard the bed exit alarming and came down the hall to find the patient has left his room and is walking around in the hallway. Nurse escorted the patient back into the room and back to bed. He had pulled off his O2 probe, all telemetry leads, condom cath, IV wrapping, and took his oxygen off. Oxygen was replaced back on the patient and the sat probe and telemetry leads were replaced. The patient was very upset and did not want to get back to bed. After speaking with him, this nurse was able to calm him and get him settled in bed. Re-educated the patient on call prevention and the use of the call light. He verbalized an understanding, but has had many forgetful episodes.

## 2019-12-30 NOTE — ED Notes (Signed)
RN assisted pt in calling daughter- voicemail left.

## 2019-12-30 NOTE — Progress Notes (Addendum)
3PHARMACY - PHYSICIAN COMMUNICATION CRITICAL VALUE ALERT - BLOOD CULTURE IDENTIFICATION (BCID)  Douglas Grant is an 86 y.o. male who presented to [facility] on 12/29/2019 with a chief complaint of COVID 19  Assessment:  Growing GPC in 1/2 blood cultures. No BCID performed. Likely contaminant.  Name of physician (or Provider) Contacted: Dr. Vanita Ingles  Current antibiotics: None  Changes to prescribed antibiotics recommended:  Recommendations accepted by provider - no antibiotics to be added at this time  No results found for this or any previous visit.  Douglas Grant, PharmD, BCPS Please see amion for complete clinical pharmacist phone list 12/30/2019  11:43 PM

## 2019-12-30 NOTE — Telephone Encounter (Signed)
I have no power/sway over what happens at this point as he is under the care of the ED team. If concerned, I would run it by the ED physician caring for him. Ty.

## 2019-12-30 NOTE — ED Notes (Signed)
Pt resting with eyes closed.

## 2019-12-30 NOTE — H&P (Addendum)
[facility] History and Physical  Douglas Grant [id number] DOB: 1930/11/10 DOA: 12/29/2019  Referring physician:  PCP: Shelda Pal, DO   Chief Complaint: SOB  HPI: Douglas Grant  86 year old WM PMHx dementia, CVA, HTN, chronic diastolic CHF, HLD, diabetes type 2 controlled without complication, previous tobacco abuse, thyroidism  Presents with concern for shortness of breath from PCPs office where he was found to have hypoxia to the mid 80s on room air.  Son-in-law is at bedside and assisting with history.  Reports he has had increasing shortness of breath over the last 3 weeks.  Reports it has progressively been getting worse.  Son-in-law reports that his lower extremity swelling has also increased.  Reports that the patient had said that he could not sleep at night due to shortness of breath when he was laying down.  The patient does report that he feels somewhat better when he sits up compared to when he lays down.  Has no prior history of CHF.  No history of COPD or asthma although he was a smoker for many years.  Has never been on inhalers.  Denies fevers, sick contacts.  Reports some mild cough, but largest concern of shortness of breath. No n/v/fevers/diarrhea/body aches.  He has chronic nasal congestion which is unchanged.    Review of Systems:  Constitutional:  No weight loss, night sweats, Fevers, chills, fatigue.  HEENT:  No headaches, Difficulty swallowing,Tooth/dental problems,Sore throat,  No sneezing, itching, ear ache, nasal congestion, post nasal drip,  Cardio-vascular:  No chest pain, Orthopnea, PND, swelling in lower extremities, anasarca, dizziness, palpitations  GI:  No heartburn, indigestion, abdominal pain, nausea, vomiting, diarrhea, change in bowel habits, loss of appetite  Resp:  Positive shortness of breath with exertion or at rest. No excess mucus, no productive cough, No non-productive cough, No coughing up of blood.No change in color of  mucus.No wheezing.No chest wall deformity  Skin:  no rash or lesions.  GU:  no dysuria, change in color of urine, no urgency or frequency. No flank pain.  Musculoskeletal:  No joint pain or swelling. No decreased range of motion. No back pain.  Psych:  No change in mood or affect. Positive depression or anxiety.Positive memory loss.   Past Medical History:  Diagnosis Date  . Arthritis   . Benign localized prostatic hyperplasia with lower urinary tract symptoms (LUTS)   . DOE (dyspnea on exertion)   . Full dentures   . Gait disturbance   . GERD (gastroesophageal reflux disease)   . Heart murmur   . History of CVA in adulthood 03/2011   acute infarct right lateral thalamus posteriorly in internal capsule,  per pt no residuals  . Hyperlipidemia   . Hypertension   . Hypothyroidism   . Iron deficiency anemia   . Memory difficulty   . Personal history of colonic polyps   . Type 2 diabetes, diet controlled ([place])    Past Surgical History:  Procedure Laterality Date  . CATARACT EXTRACTION W/ INTRAOCULAR LENS  IMPLANT, BILATERAL  2017  . CIRCUMCISION N/A 10/02/2018   Procedure: CIRCUMCISION ADULT;  Surgeon: [name], MD;  Location: [facility];  Service: Urology;  Laterality: N/A;  . HERNIA REPAIR    . LAPAROSCOPIC CHOLECYSTECTOMY  early 2000s   Social History:  reports that he quit smoking about 34 years ago. His smoking use included cigarettes. He quit after 30.00 years of use. He has never used smokeless tobacco. He reports that he does not drink alcohol  or use drugs.  Allergies  Allergen Reactions  . Atenolol Shortness Of Breath  . Clonidine Hydrochloride Other (See Comments)    Dry mouth  . Prednisone Other (See Comments)    Hair loss  . Verapamil Other (See Comments)    hallucinations    Family History  Problem Relation Age of Onset  . Depression Mother   . Alcohol abuse Father   . Alcohol abuse Brother     Prior to Admission medications   Medication Sig Start Date  End Date Taking? Authorizing Provider  alfuzosin (UROXATRAL) 10 MG 24 hr tablet Take 1 tablet (10 mg total) by mouth daily. 12/29/18   Shelda Pal, DO  cetirizine (ZYRTEC) 10 MG tablet Take 1 tablet (10 mg total) by mouth daily. Patient taking differently: Take 10 mg by mouth every morning.  05/01/18   Shelda Pal, DO  donepezil (ARICEPT) 10 MG tablet TAKE 1 TABLET AT BEDTIME 11/26/18   Wendling, Crosby Oyster, DO  Ferrous Sulfate Dried (EQ SLOW-RELEASE IRON) 45 MG TBCR Take 45 mg by mouth 2 (two) times daily.    [provider]  finasteride (PROSCAR) 5 MG tablet TAKE 1 TABLET DAILY 03/16/19   Wendling, Crosby Oyster, DO  levothyroxine (SYNTHROID, LEVOTHROID) 75 MCG tablet TAKE 1 TABLET DAILY 10/16/18   Shelda Pal, DO  memantine (NAMENDA) 5 MG tablet Take 1 tablet (5 mg total) by mouth 2 (two) times daily. 1 tab twice daily. 09/01/19   Shelda Pal, DO  nystatin-triamcinolone ointment [facility]) Apply 1 application topically 2 (two) times daily.    [provider]  omeprazole (PRILOSEC) 40 MG capsule TAKE 1 CAPSULE DAILY 05/08/19   Shelda Pal, DO  propranolol ER (INDERAL LA) 60 MG 24 hr capsule TAKE 1 CAPSULE DAILY 12/07/19   Shelda Pal, DO  QUEtiapine (SEROQUEL) 100 MG tablet TAKE 1 TABLET AT BEDTIME 12/15/18   Shelda Pal, DO  venlafaxine XR (EFFEXOR-XR) 75 MG 24 hr capsule TAKE 1 CAPSULE DAILY WITH BREAKFAST 12/15/18   Shelda Pal, DO     Consultants:    Procedures/Significant Events:  1/6 PCXR; Small hazy peripheral infiltrates are again noted in both lungs consistent with viral pneumonia   I have personally reviewed and interpreted all radiology studies and my findings are as above.   VENTILATOR SETTINGS: Nasal cannula Flow; 3 L/min   Cultures 1/5 SARS coronavirus positive 1/6 respiratory virus panel pending  1/6 SARS coronavirus pending   Antimicrobials:    12/30/19  1600  remdesivir 100 mg in sodium chloride 0.9 % 100 mL IVPB     100 mg 200 mL/hr over 30 Minutes 01/03/20 0959   12/30/19 1230  remdesivir 200 mg in sodium chloride 0.9% 250 mL IVPB  Status:  Discontinued     200 mg 580 mL/hr over 30 Minutes 12/30/19 1234   12/30/19 0000  remdesivir 100 mg in sodium chloride 0.9 % 100 mL IVPB     100 mg 200 mL/hr over 30 Minutes 12/30/19 0848       Devices    LINES / TUBES:     Continuous Infusions: . remdesivir 100 mg in NS 100 mL      Physical Exam: Vitals:   12/30/19 1030 12/30/19 1032 12/30/19 1142 12/30/19 1310  BP: (!) 191/96 (!) 191/96 (!) 191/88 (!) 191/88  Pulse: 88 95 82 82  Resp: 19 18 16 16   Temp:   99.5 F (37.5 C) 99.5 F (37.5 C)  TempSrc:  Oral Oral  SpO2: (!) 88% 99% 97%   Weight:    89.4 kg  Height:    5\' 9"  (1.753 m)    Wt Readings from Last 3 Encounters:  12/30/19 89.4 kg  12/29/19 89.4 kg  09/01/19 89.9 kg    General: A/O x2 (does not know when, why), pleasantly confused, positive acute respiratory distress Eyes: negative scleral hemorrhage, negative anisocoria, negative icterus ENT: Negative Runny nose, negative gingival bleeding, Neck:  Negative scars, masses, torticollis, lymphadenopathy, JVD Lungs: Decreased breath sounds bilaterally without wheezes or crackles Cardiovascular: Regularly regular rhythm and rate without murmur gallop or rub normal S1 and S2 Abdomen: negative abdominal pain, nondistended, positive soft, bowel sounds, no rebound, no ascites, no appreciable mass Extremities: No significant cyanosis, clubbing, or edema bilateral lower extremities Skin: Negative rashes, lesions, ulcers Psychiatric:  Negative depression, negative anxiety, negative fatigue, negative mania  Central nervous system:  Cranial nerves II through XII intact, tongue/uvula midline, all extremities muscle strength 5/5, sensation intact throughout,  negative dysarthria, negative expressive aphasia, negative receptive  aphasia.        Labs on Admission:  Basic Metabolic Panel: Recent Labs  Lab 12/29/19 1546  NA 138  K 4.6  CL 95*  CO2 37*  GLUCOSE 115*  BUN 20  CREATININE 1.13  CALCIUM 8.4*   Liver Function Tests: Recent Labs  Lab 12/29/19 1546  AST 15  ALT 19  ALKPHOS 51  BILITOT 0.4  PROT 7.1  ALBUMIN 2.9*   No results for input(s): LIPASE, AMYLASE in the last 168 hours. No results for input(s): AMMONIA in the last 168 hours. CBC: Recent Labs  Lab 12/29/19 1546  WBC 9.4  NEUTROABS 6.1  HGB 12.6*  HCT 41.3  MCV 96.5  PLT 457*   Cardiac Enzymes: No results for input(s): CKTOTAL, CKMB, CKMBINDEX, TROPONINI in the last 168 hours.  BNP (last 3 results) Recent Labs    12/29/19 1546  BNP 349.0*    ProBNP (last 3 results) No results for input(s): PROBNP in the last 8760 hours.  CBG: No results for input(s): GLUCAP in the last 168 hours.  Radiological Exams on Admission: Portable chest 1 View  Result Date: 12/30/2019 CLINICAL DATA:  Shortness of breath. COVID-19. EXAM: PORTABLE CHEST 1 VIEW COMPARISON:  12/29/2019 FINDINGS: Small hazy peripheral infiltrates are again noted in both lungs consistent with viral pneumonia. Heart size and vascularity are normal. No significant change since the prior study considering differences in patient position. IMPRESSION: No change in the appearance of the chest since the prior study. Patchy peripheral hazy bilateral infiltrates. Electronically Signed   By: Lorriane Shire M.D.   On: 12/30/2019 15:32   DG Chest Portable 1 View  Result Date: 12/29/2019 CLINICAL DATA:  Shortness of breath, COVID-19 negativity EXAM: PORTABLE CHEST 1 VIEW COMPARISON:  07/26/2017 FINDINGS: Cardiac shadow is at the upper limits of normal in size. Aortic calcifications are noted. Patchy infiltrates are noted throughout the right lung as well as in the left lung base consistent with a multifocal pneumonia. No bony abnormality is seen. No sizable effusion is noted.  IMPRESSION: Bilateral infiltrates right greater than left. Electronically Signed   By: Inez Catalina M.D.   On: 12/29/2019 17:46    EKG: Sinus arrhythmia.   Assessment/Plan Active Problems:   Hypothyroidism   Essential hypertension   Stroke [facility])   CHF (congestive heart failure) (HCC)   Acute respiratory failure with hypoxia ([place])   COVID-19 virus infection   Pneumonia due to  COVID-19 virus   Diabetes mellitus type 2, controlled, without complications ([place])   Chronic diastolic CHF (congestive heart failure) (HCC)   HLD (hyperlipidemia)  Covid pneumonia/acute respiratory failure with hypoxia COVID-19 Labs  Recent Labs    12/29/19 2356  DDIMER 3.08*  FERRITIN 51  LDH 262*  CRP 3.5*    Lab Results  Component Value Date   SARSCOV2NAA POSITIVE (A) 12/29/2019  -Family upset they had instructed ED not to transport patient to [facility] until family member had a ride because they did not believe patient positive for Covid pneumonia.. -Family member informed that a false positive extremely unlikely and that a repeat Covid test is not standard of care.  However in order to appease family at their request a second Covid test ordered.   -Decadron 6 mg daily -Remdesivir per pharmacy protocol -Currently patient does not meet criteria for Actemra or Covid convalescent plasma  Chronic diastolic CHF -Strict in and out -Daily weight -Hydralazine p.o. 10 mg TID -Propranolol ER 60 mg daily  Essential HTN -See CHF  Dementia/Hx stroke -Unknown baseline  Diabetes type 2 controlled without complication -Sensitive SSI -A1c pending  HLD -Lipid panel pending  Hypothyroidism -Synthroid 75 mcg daily  Stage I buttocks ulcer bilateral Pressure Injury 12/30/19 Buttocks Bilateral Stage 1 -  Intact skin with non-blanchable redness of a localized area usually over a bony prominence. (Active)  12/30/19 1230  Location: Buttocks  Location Orientation: Bilateral  Staging: Stage 1 -  Intact skin  with non-blanchable redness of a localized area usually over a bony prominence.  Wound Description (Comments):   Present on Admission: Yes        Code Status: Full (DVT Prophylaxis: Lovenox Family Communication: 12/30/2019 Disposition Plan: TBD     Data Reviewed: Care during the described time interval was provided by me .  I have reviewed this patient's available data, including medical history, events of note, physical examination, and all test results as part of my evaluation.   The patient is critically ill with multiple organ systems failure and requires high complexity decision making for assessment and support, frequent evaluation and titration of therapies, application of advanced monitoring technologies and extensive interpretation of multiple databases. Critical Care Time devoted to patient care services described in this note  Time spent: 66 minutes   Jonesha Tsuchiya, [facility] Pager [phone]

## 2019-12-30 NOTE — Telephone Encounter (Signed)
Called the family informed of PCP instructions. He agreed and verbalized understanding.

## 2019-12-30 NOTE — ED Notes (Signed)
Pt is awake wanting to get out of bed  Instructed pt that it was not time to get up and redirected him to stay in bed  Pt knows his name and where he is  Pleasant in conversation  Repositioned and blankets reapplied  Pt given call button and instructed to call for assistance

## 2019-12-30 NOTE — Plan of Care (Signed)

## 2019-12-30 NOTE — ED Notes (Signed)
Attempted to call daughter and update plan of care- sent to voicemail x 3.

## 2019-12-31 DIAGNOSIS — E038 Other specified hypothyroidism: Secondary | ICD-10-CM

## 2019-12-31 LAB — COMPREHENSIVE METABOLIC PANEL
ALT: 18 U/L (ref 0–44)
AST: 17 U/L (ref 15–41)
Albumin: 2.5 g/dL — ABNORMAL LOW (ref 3.5–5.0)
Alkaline Phosphatase: 45 U/L (ref 38–126)
Anion gap: 9 (ref 5–15)
BUN: 25 mg/dL — ABNORMAL HIGH (ref 8–23)
CO2: 36 mmol/L — ABNORMAL HIGH (ref 22–32)
Calcium: 8 mg/dL — ABNORMAL LOW (ref 8.9–10.3)
Chloride: 93 mmol/L — ABNORMAL LOW (ref 98–111)
Creatinine, Ser: 1.18 mg/dL (ref 0.61–1.24)
GFR calc Af Amer: >60 mL/min (ref >60)
GFR calc non Af Amer: 55 mL/min — ABNORMAL LOW (ref >60)
Glucose, Bld: 111 mg/dL — ABNORMAL HIGH (ref 70–99)
Potassium: 4.1 mmol/L (ref 3.5–5.1)
Sodium: 138 mmol/L (ref 135–145)
Total Bilirubin: 0.5 mg/dL (ref 0.3–1.2)
Total Protein: 6.3 g/dL — ABNORMAL LOW (ref 6.5–8.1)

## 2019-12-31 LAB — CBC WITH DIFFERENTIAL/PLATELET
Abs Immature Granulocytes: 0.08 10*3/uL — ABNORMAL HIGH (ref 0.00–0.07)
Basophils Absolute: 0 10*3/uL (ref 0.0–0.1)
Basophils Relative: 0 %
Eosinophils Absolute: 0 10*3/uL (ref 0.0–0.5)
Eosinophils Relative: 0 %
HCT: 39.6 % (ref 39.0–52.0)
Hemoglobin: 12.5 g/dL — ABNORMAL LOW (ref 13.0–17.0)
Immature Granulocytes: 1 %
Lymphocytes Relative: 22 %
Lymphs Abs: 2.1 10*3/uL (ref 0.7–4.0)
MCH: 29.9 pg (ref 26.0–34.0)
MCHC: 31.6 g/dL (ref 30.0–36.0)
MCV: 94.7 fL (ref 80.0–100.0)
Monocytes Absolute: 1 10*3/uL (ref 0.1–1.0)
Monocytes Relative: 10 %
Neutro Abs: 6.6 10*3/uL (ref 1.7–7.7)
Neutrophils Relative %: 67 %
Platelets: 433 10*3/uL — ABNORMAL HIGH (ref 150–400)
RBC: 4.18 MIL/uL — ABNORMAL LOW (ref 4.22–5.81)
RDW: 13.3 % (ref 11.5–15.5)
WBC: 9.9 10*3/uL (ref 4.0–10.5)
nRBC: 0 % (ref 0.0–0.2)

## 2019-12-31 LAB — D-DIMER, QUANTITATIVE: D-Dimer, Quant: 2.51 ug/mL-FEU — ABNORMAL HIGH (ref 0.00–0.50)

## 2019-12-31 LAB — LIPID PANEL
Cholesterol: 141 mg/dL (ref 0–200)
HDL: 32 mg/dL — ABNORMAL LOW (ref >40)
LDL Cholesterol: 97 mg/dL (ref 0–99)
Total CHOL/HDL Ratio: 4.4 RATIO
Triglycerides: 61 mg/dL (ref <150)
VLDL: 12 mg/dL (ref 0–40)

## 2019-12-31 LAB — SARS CORONAVIRUS 2 (TAT 6-24 HRS): SARS Coronavirus 2: POSITIVE — AB

## 2019-12-31 LAB — PHOSPHORUS: Phosphorus: 4.3 mg/dL (ref 2.5–4.6)

## 2019-12-31 LAB — MAGNESIUM: Magnesium: 1.8 mg/dL (ref 1.7–2.4)

## 2019-12-31 LAB — HEMOGLOBIN A1C
Hgb A1c MFr Bld: 7 % — ABNORMAL HIGH (ref 4.8–5.6)
Mean Plasma Glucose: 154.2 mg/dL

## 2019-12-31 LAB — C-REACTIVE PROTEIN: CRP: 2.1 mg/dL — ABNORMAL HIGH (ref <1.0)

## 2019-12-31 LAB — FERRITIN: Ferritin: 39 ng/mL (ref 24–336)

## 2019-12-31 MED ORDER — ATORVASTATIN CALCIUM 10 MG PO TABS
20.0000 mg | ORAL_TABLET | Freq: Every day | ORAL | Status: DC
  Administered 2019-12-31 – 2020-01-06 (×7): 20 mg via ORAL
  Filled 2019-12-31 (×7): qty 2

## 2019-12-31 NOTE — Progress Notes (Addendum)
PROGRESS NOTE    Douglas Grant  TDD:[id number] DOB: May 01, 1930 DOA: 12/29/2019 PCP: Shelda Pal, DO   Brief Narrative:  Douglas Grant  86 year old WM PMHx dementia, CVA, HTN, chronic diastolic CHF, HLD, diabetes type 2 controlled without complication, previous tobacco abuse, thyroidism  Presents with concern for shortness of breathfrom PCPs office where he was found to have hypoxia to the mid 80s on room air.  Son-in-law is at bedside andassisting with history.Reports he has had increasing shortness of breath over the last 3 weeks.Reports it has progressively been getting worse. Son-in-law reports that his lower extremity swelling has also increased. Reports that the patient had said that he could not sleep at night due to shortness of breath when he was laying down. The patient does report that he feels somewhat better when he sits up compared to when he lays down. Has no prior history of CHF. No history of COPD or asthma although he was a smoker for many years. Has never been on inhalers. Denies fevers, sick contacts. Reports some mild cough, but largestconcern of shortness of breath. No n/v/fevers/diarrhea/body aches. He has chronic nasal congestion which is unchanged.    Subjective: A/O X1 (does not know where, when, why).  Upset that he has to wear hospital gown believes it is a woman's dress.   Assessment & Plan:   Active Problems:   Hypothyroidism   Essential hypertension   Stroke (HCC)   CHF (congestive heart failure) (HCC)   Acute respiratory failure with hypoxia ([place])   COVID-19 virus infection   Pneumonia due to COVID-19 virus   Diabetes mellitus type 2, controlled, without complications (HCC)   Chronic diastolic CHF (congestive heart failure) (HCC)   HLD (hyperlipidemia)   Pressure injury of skin   Covid pneumonia/acute respiratory failure with hypoxia COVID-19 Labs  Recent Labs    12/29/19 2356 12/30/19 1513 12/31/19 0100  DDIMER  3.08* 3.15* 2.51*  FERRITIN 51 49 39  LDH 262* 211*  --   CRP 3.5* 2.9* 2.1*    Lab Results  Component Value Date   SARSCOV2NAA POSITIVE (A) 12/30/2019   SARSCOV2NAA POSITIVE (A) 12/29/2019   -Family upset they had instructed ED not to transport patient to [facility] until family member had a ride because they did not believe patient positive for Covid pneumonia.. -Family member informed that a false positive extremely unlikely and that a repeat Covid test is not standard of care.  However in order to appease family at their request a second Covid test ordered.   -Decadron 6 mg daily -Remdesivir per pharmacy protocol -Currently patient does not meet criteria for Actemra or Covid convalescent plasma  Chronic diastolic CHF -Strict in and out -1.6 L -Daily weight Filed Weights   12/30/19 1310 12/31/19 0500  Weight: 89.4 kg 90 kg  -Hydralazine p.o. 10 mg TID -Propranolol ER 60 mg daily  Essential HTN -See CHF  Dementia/Hx stroke -Unknown baseline  Diabetes type 2 controlled without complication -1/7 hemoglobin A1c= 7.0 -Sensitive SSI  HLD -1/7 LDL= 97 -1/7 Lipitor 20 mg daily  Hypothyroidism -Synthroid 75 mcg daily  Stage I buttocks ulcer bilateral Pressure Injury 12/30/19 Buttocks Bilateral Stage 1 -  Intact skin with non-blanchable redness of a localized area usually over a bony prominence. (Active)  12/30/19 1230  Location: Buttocks  Location Orientation: Bilateral  Staging: Stage 1 -  Intact skin with non-blanchable redness of a localized area usually over a bony prominence.  Wound Description (Comments):   Present on  Admission: Yes       DVT prophylaxis: Lovenox Code Status: Full Family Communication: 12/31/2019 spoke with Douglas Grant (son) [phone].  Counseled on plan of care answered all questions Disposition Plan: TBD   Consultants:    Procedures/Significant Events:     I have personally reviewed and interpreted all radiology studies and my  findings are as above.  VENTILATOR SETTINGS: Nasal cannula/1/7 Flow; 3 L/min SPO2 91%   Cultures   Antimicrobials:  12/30/19 1600  remdesivir 100 mg in sodium chloride 0.9 % 100 mL IVPB     100 mg 200 mL/hr over 30 Minutes 01/03/20 0959   12/30/19 1230  remdesivir 200 mg in sodium chloride 0.9% 250 mL IVPB  Status:  Discontinued     200 mg 580 mL/hr over 30 Minutes 12/30/19 1234   12/30/19 0000  remdesivir 100 mg in sodium chloride 0.9 % 100 mL IVPB     100 mg 200 mL/hr over 30 Minutes 12/30/19 0848       Devices    LINES / TUBES:      Continuous Infusions: . remdesivir 100 mg in NS 100 mL 100 mg (12/31/19 0933)     Objective: Vitals:   12/31/19 0500 12/31/19 0723 12/31/19 0928 12/31/19 1129  BP:  (!) 168/71 (!) 142/62 (!) 149/62  Pulse:  69  82  Resp:  18  19  Temp:  98.1 F (36.7 C)  99.1 F (37.3 C)  TempSrc:  Oral  Oral  SpO2:  99%  95%  Weight: 90 kg     Height:        Intake/Output Summary (Last 24 hours) at 12/31/2019 1424 Last data filed at 12/31/2019 1420 Gross per 24 hour  Intake 300 ml  Output 126 ml  Net 174 ml   Filed Weights   12/30/19 1310 12/31/19 0500  Weight: 89.4 kg 90 kg    Examination:  General: A/O x1 (does not know where, when, why), positive acute respiratory distress Eyes: negative scleral hemorrhage, negative anisocoria, negative icterus ENT: Negative Runny nose, negative gingival bleeding, Neck:  Negative scars, masses, torticollis, lymphadenopathy, JVD Lungs: Clear to auscultation bilaterally without wheezes or crackles Cardiovascular: Regular rate and rhythm without murmur gallop or rub normal S1 and S2 Abdomen: negative abdominal pain, nondistended, positive soft, bowel sounds, no rebound, no ascites, no appreciable mass Extremities: No significant cyanosis, clubbing, or edema bilateral lower extremities Skin: Negative rashes, lesions, ulcers Psychiatric:  Negative depression, negative anxiety, negative fatigue,  negative mania  Central nervous system:  Cranial nerves II through XII intact, tongue/uvula midline, all extremities muscle strength 5/5, sensation intact throughout, negative dysarthria, negative expressive aphasia, negative receptive aphasia.  .     Data Reviewed: Care during the described time interval was provided by me .  I have reviewed this patient's available data, including medical history, events of note, physical examination, and all test results as part of my evaluation.   CBC: Recent Labs  Lab 12/29/19 1546 12/30/19 1513 12/31/19 0100  WBC 9.4 8.9 9.9  NEUTROABS 6.1 6.8 6.6  HGB 12.6* 13.3 12.5*  HCT 41.3 42.7 39.6  MCV 96.5 93.4 94.7  PLT 457* 477* 035*   Basic Metabolic Panel: Recent Labs  Lab 12/29/19 1546 12/30/19 1513 12/31/19 0100  NA 138 141 138  K 4.6 4.3 4.1  CL 95* 91* 93*  CO2 37* 37* 36*  GLUCOSE 115* 143* 111*  BUN 20 23 25*  CREATININE 1.13 1.13 1.18  CALCIUM 8.4* 8.4* 8.0*  MG  --  1.7 1.8  PHOS  --  3.7 4.3   GFR: Estimated Creatinine Clearance: 48.9 mL/min (by C-G formula based on SCr of 1.18 mg/dL). Liver Function Tests: Recent Labs  Lab 12/29/19 1546 12/30/19 1513 12/31/19 0100  AST _0 ALT _1 ALKPHOS 51 53 45  BILITOT 0.4 0.6 0.5  PROT 7.1 6.9 6.3*  ALBUMIN 2.9* 2.7* 2.5*   No results for input(s): LIPASE, AMYLASE in the last 168 hours. No results for input(s): AMMONIA in the last 168 hours. Coagulation Profile: No results for input(s): INR, PROTIME in the last 168 hours. Cardiac Enzymes: No results for input(s): CKTOTAL, CKMB, CKMBINDEX, TROPONINI in the last 168 hours. BNP (last 3 results) No results for input(s): PROBNP in the last 8760 hours. HbA1C: Recent Labs    12/31/19 0110  HGBA1C 7.0*   CBG: No results for input(s): GLUCAP in the last 168 hours. Lipid Profile: Recent Labs    12/29/19 2356 12/31/19 0100  CHOL  --  141  HDL  --  32*  LDLCALC  --  97  TRIG 54 61  CHOLHDL  --  4.4    Thyroid Function Tests: No results for input(s): TSH, T4TOTAL, FREET4, T3FREE, THYROIDAB in the last 72 hours. Anemia Panel: Recent Labs    12/30/19 1513 12/31/19 0100  FERRITIN 49 39   Urine analysis:    Component Value Date/Time   COLORURINE YELLOW 08/31/2018 1342   APPEARANCEUR CLEAR 08/31/2018 1342   LABSPEC 1.025 08/31/2018 1342   PHURINE 5.5 08/31/2018 1342   GLUCOSEU NEGATIVE 08/31/2018 1342   HGBUR SMALL (A) 08/31/2018 1342   HGBUR negative 07/13/2008 1535   BILIRUBINUR negative 07/29/2019 1022   KETONESUR NEGATIVE 08/31/2018 1342   PROTEINUR Positive (A) 07/29/2019 1022   PROTEINUR 30 (A) 08/31/2018 1342   UROBILINOGEN 0.2 07/29/2019 1022   UROBILINOGEN 1.0 04/21/2011 0643   NITRITE negative 07/29/2019 1022   NITRITE NEGATIVE 08/31/2018 1342   LEUKOCYTESUR Negative 07/29/2019 1022   Sepsis Labs: _2 (procalcitonin:4,lacticidven:4)  ) Recent Results (from the past 240 hour(s))  SARS Coronavirus 2 Ag (30 min TAT) - Nasal Swab (BD Veritor Kit)     Status: None   Collection Time: 12/29/19  4:13 PM   Specimen: Nasal Swab (BD Veritor Kit)  Result Value Ref Range Status   SARS Coronavirus 2 Ag NEGATIVE NEGATIVE Final    Comment: (NOTE) SARS-CoV-2 antigen NOT DETECTED.  Negative results are presumptive.  Negative results do not preclude SARS-CoV-2 infection and should not be used as the sole basis for treatment or other patient management decisions, including infection  control decisions, particularly in the presence of clinical signs and  symptoms consistent with COVID-19, or in those who have been in contact with the virus.  Negative results must be combined with clinical observations, patient history, and epidemiological information. The expected result is Negative. Fact Sheet for Patients: PodPark.tn Fact Sheet for Healthcare Providers: GiftContent.is This test is not yet approved or cleared  by the [place] FDA and  has been authorized for detection and/or diagnosis of SARS-CoV-2 by FDA under an Emergency Use Authorization (EUA).  This EUA will remain in effect (meaning this test can be used) for the duration of  the COVID-19 de claration under Section 564(b)(1) of the Act, 21 U.S.C. section 360bbb-3(b)(1), unless the authorization is terminated or revoked sooner. Performed at [facility], [address] [place], [place] [address]   SARS Coronavirus 2 by  RT PCR (hospital order, performed in [facility] hospital lab) Nasopharyngeal Nasopharyngeal Swab     Status: Abnormal   Collection Time: 12/29/19  8:30 PM   Specimen: Nasopharyngeal Swab  Result Value Ref Range Status   SARS Coronavirus 2 POSITIVE (A) NEGATIVE Final    Comment: RESULT CALLED TO, READ BACK BY AND VERIFIED WITH: KELLIE NEAL RN AT 2311 ON 12/29/19 BY I.SUGUT (NOTE) SARS-CoV-2 target nucleic acids are DETECTED SARS-CoV-2 RNA is generally detectable in upper respiratory specimens  during the acute phase of infection.  Positive results are indicative  of the presence of the identified virus, but do not rule out bacterial infection or co-infection with other pathogens not detected by the test.  Clinical correlation with patient history and  other diagnostic information is necessary to determine patient infection status.  The expected result is negative. Fact Sheet for Patients:   StrictlyIdeas.no  Fact Sheet for Healthcare Providers:   BankingDealers.co.za   This test is not yet approved or cleared by the [place] FDA and  has been authorized for detection and/or diagnosis of SARS-CoV-2 by FDA under an Emergency Use Authorization (EUA).  This EUA will remain in effect (meaning this t est can be used) for the duration of  the COVID-19 declaration under Section 564(b)(1) of the Act, 21 U.S.C. section 360-bbb-3(b)(1), unless the authorization  is terminated or revoked sooner. Performed at [facility], [address] [place], [place] [address]   Blood Culture (routine x 2)     Status: None (Preliminary result)   Collection Time: 12/29/19 11:59 PM   Specimen: BLOOD  Result Value Ref Range Status   Specimen Description   Final    BLOOD LEFT ANTECUBITAL Performed at [facility], [address] [place], [place] [address]    Special Requests   Final    BOTTLES DRAWN AEROBIC AND ANAEROBIC Blood Culture adequate volume Performed at [facility], [address] [place], [place] [address]    Culture   Final    NO GROWTH 1 DAY Performed at [facility], [place] [address], [place], [place] [address]    Report Status PENDING  Incomplete  Blood Culture (routine x 2)     Status: Abnormal (Preliminary result)   Collection Time: 12/30/19 12:05 AM   Specimen: BLOOD LEFT WRIST  Result Value Ref Range Status   Specimen Description   Final    BLOOD LEFT WRIST Performed at [facility], [address] [place], [place] [address]    Special Requests   Final    BOTTLES DRAWN AEROBIC AND ANAEROBIC Blood Culture adequate volume Performed at [facility], [address] [place], [place] [address]    Culture  Setup Time   Final    GRAM POSITIVE COCCI IN CLUSTERS IN BOTH AEROBIC AND ANAEROBIC BOTTLES CRITICAL RESULT CALLED TO, READ BACK BY AND VERIFIED WITH: K AMEND PHARMD [id number] 12/30/19 A BROWNING    Culture (A)  Final    STAPHYLOCOCCUS SPECIES (COAGULASE NEGATIVE) THE SIGNIFICANCE OF ISOLATING THIS ORGANISM FROM A SINGLE SET OF BLOOD CULTURES WHEN MULTIPLE SETS ARE DRAWN IS UNCERTAIN. PLEASE NOTIFY THE MICROBIOLOGY DEPARTMENT WITHIN ONE WEEK IF SPECIATION AND SENSITIVITIES ARE REQUIRED. Performed at [facility], [place] [address], [place], [place] [address]    Report Status PENDING  Incomplete  Respiratory Panel by PCR     Status: None   Collection Time: 12/30/19 12:22  PM    Specimen: Nasopharyngeal Swab; Respiratory  Result Value Ref Range Status   Adenovirus NOT DETECTED NOT DETECTED Final   Coronavirus 229E NOT DETECTED NOT DETECTED Final    Comment: (NOTE) The Coronavirus on the Respiratory Panel, DOES NOT test for the novel  Coronavirus (2019 nCoV)    Coronavirus HKU1 NOT DETECTED NOT DETECTED Final   Coronavirus NL63 NOT DETECTED NOT DETECTED Final   Coronavirus OC43 NOT DETECTED NOT DETECTED Final   Metapneumovirus NOT DETECTED NOT DETECTED Final   Rhinovirus / Enterovirus NOT DETECTED NOT DETECTED Final   Influenza A NOT DETECTED NOT DETECTED Final   Influenza B NOT DETECTED NOT DETECTED Final   Parainfluenza Virus 1 NOT DETECTED NOT DETECTED Final   Parainfluenza Virus 2 NOT DETECTED NOT DETECTED Final   Parainfluenza Virus 3 NOT DETECTED NOT DETECTED Final   Parainfluenza Virus 4 NOT DETECTED NOT DETECTED Final   Respiratory Syncytial Virus NOT DETECTED NOT DETECTED Final   Bordetella pertussis NOT DETECTED NOT DETECTED Final   Chlamydophila pneumoniae NOT DETECTED NOT DETECTED Final   Mycoplasma pneumoniae NOT DETECTED NOT DETECTED Final    Comment: Performed at [facility], [place] [address], [place], [place] [address]  SARS CORONAVIRUS 2 (TAT 6-24 HRS) Nasopharyngeal Nasopharyngeal Swab     Status: Abnormal   Collection Time: 12/30/19  2:49 PM   Specimen: Nasopharyngeal Swab  Result Value Ref Range Status   SARS Coronavirus 2 POSITIVE (A) NEGATIVE Final    Comment: RESULT CALLED TO, READ BACK BY AND VERIFIED WITH: M.KENNEY,RN 0017 12/31/19 G.MCADOO (NOTE) SARS-CoV-2 target nucleic acids are DETECTED. The SARS-CoV-2 RNA is generally detectable in upper and lower respiratory specimens during the acute phase of infection. Positive results are indicative of the presence of SARS-CoV-2 RNA. Clinical correlation with patient history and other diagnostic information is  necessary to determine patient infection status. Positive results  do not rule out bacterial infection or co-infection with other viruses.  The expected result is Negative. Fact Sheet for Patients: SugarRoll.be Fact Sheet for Healthcare Providers: https://www.-mathews.com/ This test is not yet approved or cleared by the [place] FDA and  has been authorized for detection and/or diagnosis of SARS-CoV-2 by FDA under an Emergency Use Authorization (EUA). This EUA will remain  in effect (meaning this test can be used) for the du ration of the COVID-19 declaration under Section 564(b)(1) of the Act, 21 U.S.C. section 360bbb-3(b)(1), unless the authorization is terminated or revoked sooner. Performed at [facility], [place] [address] [place], [place] [address]   Culture, blood (routine x 2)     Status: None (Preliminary result)   Collection Time: 12/31/19  2:30 AM   Specimen: BLOOD LEFT WRIST  Result Value Ref Range Status   Specimen Description BLOOD LEFT WRIST  Final   Special Requests   Final    BOTTLES DRAWN AEROBIC ONLY Blood Culture adequate volume Performed at [facility], [place] [address] [place], [place] [address]    Culture PENDING  Incomplete   Report Status PENDING  Incomplete  Culture, blood (routine x 2)     Status: None (Preliminary result)   Collection Time: 12/31/19  2:35 AM   Specimen: BLOOD  Result Value Ref Range Status   Specimen Description BLOOD RIGHT ANTECUBITAL  Final   Special Requests   Final    BOTTLES DRAWN AEROBIC ONLY Blood Culture adequate volume Performed at [facility], [address], [place], [place] [address]  Culture PENDING  Incomplete   Report Status PENDING  Incomplete         Radiology Studies: Portable chest 1 View  Result Date: 12/30/2019 CLINICAL DATA:  Shortness of breath. COVID-19. EXAM: PORTABLE CHEST 1 VIEW COMPARISON:  12/29/2019 FINDINGS: Small hazy peripheral infiltrates are again noted in both lungs consistent with viral  pneumonia. Heart size and vascularity are normal. No significant change since the prior study considering differences in patient position. IMPRESSION: No change in the appearance of the chest since the prior study. Patchy peripheral hazy bilateral infiltrates. Electronically Signed   By: Lorriane Shire M.D.   On: 12/30/2019 15:32   DG Chest Portable 1 View  Result Date: 12/29/2019 CLINICAL DATA:  Shortness of breath, COVID-19 negativity EXAM: PORTABLE CHEST 1 VIEW COMPARISON:  07/26/2017 FINDINGS: Cardiac shadow is at the upper limits of normal in size. Aortic calcifications are noted. Patchy infiltrates are noted throughout the right lung as well as in the left lung base consistent with a multifocal pneumonia. No bony abnormality is seen. No sizable effusion is noted. IMPRESSION: Bilateral infiltrates right greater than left. Electronically Signed   By: Inez Catalina M.D.   On: 12/29/2019 17:46        Scheduled Meds: . alfuzosin  10 mg Oral Daily  . aspirin EC  81 mg Oral Daily  . dexamethasone (DECADRON) injection  6 mg Intravenous Q24H  . docusate sodium  100 mg Oral BID  . donepezil  10 mg Oral QHS  . enoxaparin (LOVENOX) injection  40 mg Subcutaneous Q24H  . finasteride  5 mg Oral Daily  . hydrALAZINE  10 mg Oral Q8H  . Ipratropium-Albuterol  1 puff Inhalation Q6H  . levothyroxine  75 mcg Oral Daily  . memantine  5 mg Oral BID  . pantoprazole  40 mg Oral Daily  . propranolol ER  60 mg Oral Daily  . QUEtiapine  100 mg Oral QHS  . venlafaxine XR  75 mg Oral Q breakfast   Continuous Infusions: . remdesivir 100 mg in NS 100 mL 100 mg (12/31/19 0933)     LOS: 1 day   The patient is critically ill with multiple organ systems failure and requires high complexity decision making for assessment and support, frequent evaluation and titration of therapies, application of advanced monitoring technologies and extensive interpretation of multiple databases. Critical Care Time devoted to  patient care services described in this note  Time spent: 40 minutes    Sanae Willetts, Geraldo Docker, MD [facility] Pager [phone]  If 7PM-7AM, please contact night-coverage [email or web address] Password [facility] 12/31/2019, 2:24 PM

## 2019-12-31 NOTE — Progress Notes (Signed)
CSW acknowledging consult for HH/DME/SNF needs. Patient new admit, on IV decadron and IV remdesivir. If patient will have discharge needs, MD needs to order PT and OT evals to determine recommendations.  At this time, no needs identified.  Laveda Abbe, [place] Clinical Social Worker [phone]

## 2020-01-01 LAB — BLOOD CULTURE ID PANEL (REFLEXED)

## 2020-01-01 LAB — CBC WITH DIFFERENTIAL/PLATELET
Abs Immature Granulocytes: 0.03 10*3/uL (ref 0.00–0.07)
Basophils Absolute: 0 10*3/uL (ref 0.0–0.1)
Basophils Relative: 0 %
Eosinophils Absolute: 0 10*3/uL (ref 0.0–0.5)
Eosinophils Relative: 0 %
HCT: 38 % — ABNORMAL LOW (ref 39.0–52.0)
Hemoglobin: 11.6 g/dL — ABNORMAL LOW (ref 13.0–17.0)
Immature Granulocytes: 0 %
Lymphocytes Relative: 17 %
Lymphs Abs: 1.7 10*3/uL (ref 0.7–4.0)
MCH: 28.9 pg (ref 26.0–34.0)
MCHC: 30.5 g/dL (ref 30.0–36.0)
MCV: 94.8 fL (ref 80.0–100.0)
Monocytes Absolute: 0.7 10*3/uL (ref 0.1–1.0)
Monocytes Relative: 7 %
Neutro Abs: 7.4 10*3/uL (ref 1.7–7.7)
Neutrophils Relative %: 76 %
Platelets: 437 10*3/uL — ABNORMAL HIGH (ref 150–400)
RBC: 4.01 MIL/uL — ABNORMAL LOW (ref 4.22–5.81)
RDW: 13.2 % (ref 11.5–15.5)
WBC: 9.7 10*3/uL (ref 4.0–10.5)
nRBC: 0 % (ref 0.0–0.2)

## 2020-01-01 LAB — COMPREHENSIVE METABOLIC PANEL
ALT: 16 U/L (ref 0–44)
AST: 18 U/L (ref 15–41)
Albumin: 2.5 g/dL — ABNORMAL LOW (ref 3.5–5.0)
Alkaline Phosphatase: 42 U/L (ref 38–126)
Anion gap: 10 (ref 5–15)
BUN: 33 mg/dL — ABNORMAL HIGH (ref 8–23)
CO2: 35 mmol/L — ABNORMAL HIGH (ref 22–32)
Calcium: 8.3 mg/dL — ABNORMAL LOW (ref 8.9–10.3)
Chloride: 92 mmol/L — ABNORMAL LOW (ref 98–111)
Creatinine, Ser: 1.18 mg/dL (ref 0.61–1.24)
GFR calc Af Amer: >60 mL/min (ref >60)
GFR calc non Af Amer: 55 mL/min — ABNORMAL LOW (ref >60)
Glucose, Bld: 107 mg/dL — ABNORMAL HIGH (ref 70–99)
Potassium: 4.1 mmol/L (ref 3.5–5.1)
Sodium: 137 mmol/L (ref 135–145)
Total Bilirubin: 0.2 mg/dL — ABNORMAL LOW (ref 0.3–1.2)
Total Protein: 6.1 g/dL — ABNORMAL LOW (ref 6.5–8.1)

## 2020-01-01 LAB — PHOSPHORUS: Phosphorus: 4.1 mg/dL (ref 2.5–4.6)

## 2020-01-01 LAB — FERRITIN: Ferritin: 38 ng/mL (ref 24–336)

## 2020-01-01 LAB — MAGNESIUM: Magnesium: 1.8 mg/dL (ref 1.7–2.4)

## 2020-01-01 LAB — D-DIMER, QUANTITATIVE: D-Dimer, Quant: 1.89 ug/mL-FEU — ABNORMAL HIGH (ref 0.00–0.50)

## 2020-01-01 LAB — C-REACTIVE PROTEIN: CRP: 1.3 mg/dL — ABNORMAL HIGH (ref <1.0)

## 2020-01-01 MED ORDER — HYDRALAZINE HCL 25 MG PO TABS
25.0000 mg | ORAL_TABLET | Freq: Three times a day (TID) | ORAL | Status: DC
  Administered 2020-01-01 – 2020-01-02 (×2): 25 mg via ORAL
  Filled 2020-01-01 (×2): qty 1

## 2020-01-01 MED ORDER — CLONIDINE HCL 0.1 MG PO TABS: 0.1000 mg | ORAL_TABLET | Freq: Two times a day (BID) | ORAL | Status: DC

## 2020-01-01 NOTE — Progress Notes (Addendum)
PROGRESS NOTE    Douglas Grant  MWU:[id number] DOB: [birth year] DOA: 12/29/2019 PCP: Shelda Pal, DO   Brief Narrative:  Douglas Grant  86 year old WM PMHx dementia, CVA, HTN, chronic diastolic CHF, HLD, diabetes type 2 controlled without complication, previous tobacco abuse, thyroidism  Presents with concern for shortness of breathfrom PCPs office where he was found to have hypoxia to the mid 80s on room air.  Son-in-law is at bedside andassisting with history.Reports he has had increasing shortness of breath over the last 3 weeks.Reports it has progressively been getting worse. Son-in-law reports that his lower extremity swelling has also increased. Reports that the patient had said that he could not sleep at night due to shortness of breath when he was laying down. The patient does report that he feels somewhat better when he sits up compared to when he lays down. Has no prior history of CHF. No history of COPD or asthma although he was a smoker for many years. Has never been on inhalers. Denies fevers, sick contacts. Reports some mild cough, but largestconcern of shortness of breath. No n/v/fevers/diarrhea/body aches. He has chronic nasal congestion which is unchanged.    Subjective: 1/8/O x1 (does not know where, when, why), pleasantly confused.   Assessment & Plan:   Active Problems:   Hypothyroidism   Essential hypertension   Stroke (HCC)   CHF (congestive heart failure) (HCC)   Acute respiratory failure with hypoxia ([facility])   COVID-19 virus infection   Pneumonia due to COVID-19 virus   Diabetes mellitus type 2, controlled, without complications (HCC)   Chronic diastolic CHF (congestive heart failure) (HCC)   HLD (hyperlipidemia)   Pressure injury of skin   Covid pneumonia/acute respiratory failure with hypoxia COVID-19 Labs  Recent Labs    12/29/19 2356 12/30/19 1513 12/31/19 0100 01/01/20 0126  DDIMER 3.08* 3.15* 2.51* 1.89*  FERRITIN 51  49 39 38  LDH 262* 211*  --   --   CRP 3.5* 2.9* 2.1* 1.3*    Lab Results  Component Value Date   SARSCOV2NAA POSITIVE (A) 12/30/2019   SARSCOV2NAA POSITIVE (A) 12/29/2019   -Family upset they had instructed ED not to transport patient to [facility] until family member had a ride because they did not believe patient positive for Covid pneumonia.. -Family member informed that a false positive extremely unlikely and that a repeat Covid test is not standard of care.  However in order to appease family at their request a second Covid test ordered.   -Decadron 6 mg daily -Remdesivir per pharmacy protocol -Currently patient does not meet criteria for Actemra or Covid convalescent plasma -1/8 ambulatory SPO2 pending  Chronic diastolic CHF -Strict in and out -1.6 L -Daily weight Filed Weights   12/30/19 1310 12/31/19 0500 01/01/20 0500  Weight: 89.4 kg 90 kg 85 kg  -1/8 increase Hydralazine p.o. 25 TID -Propranolol ER 60 mg daily  Essential HTN -See CHF  Dementia/Hx stroke -Unknown baseline  Diabetes type 2 controlled without complication -1/7 hemoglobin A1c= 7.0 -Sensitive SSI  HLD -1/7 LDL= 97 -1/7 Lipitor 20 mg daily  Hypothyroidism -Synthroid 75 mcg daily  Stage I buttocks ulcer bilateral Pressure Injury 12/30/19 Buttocks Bilateral Stage 1 -  Intact skin with non-blanchable redness of a localized area usually over a bony prominence. (Active)  12/30/19 1230  Location: Buttocks  Location Orientation: Bilateral  Staging: Stage 1 -  Intact skin with non-blanchable redness of a localized area usually over a bony prominence.  Wound Description (Comments):  Present on Admission: Yes   Goals of care -1/8 PT/OT consult; evaluate for SNF vs CIR    DVT prophylaxis: Lovenox Code Status: Full Family Communication: 01/01/2020 spoke with Barbaraann Rondo (son) [phone].  Counseled on plan of care answered all questions Disposition Plan: TBD   Consultants:     Procedures/Significant Events:     I have personally reviewed and interpreted all radiology studies and my findings are as above.  VENTILATOR SETTINGS: Nasal cannula/1/8 Flow; 2 L/min SPO2 92%   Cultures   Antimicrobials:  12/30/19 1600  remdesivir 100 mg in sodium chloride 0.9 % 100 mL IVPB     100 mg 200 mL/hr over 30 Minutes 01/03/20 0959   12/30/19 1230  remdesivir 200 mg in sodium chloride 0.9% 250 mL IVPB  Status:  Discontinued     200 mg 580 mL/hr over 30 Minutes 12/30/19 1234   12/30/19 0000  remdesivir 100 mg in sodium chloride 0.9 % 100 mL IVPB     100 mg 200 mL/hr over 30 Minutes 12/30/19 0848       Devices    LINES / TUBES:      Continuous Infusions: . remdesivir 100 mg in NS 100 mL 100 mg (01/01/20 1023)     Objective: Vitals:   01/01/20 0025 01/01/20 0500 01/01/20 0510 01/01/20 0800  BP: (!) 158/64  (!) 179/98   Pulse: (!) 58  73   Resp: 13  18   Temp: 98.1 F (36.7 C)  98 F (36.7 C) 98.2 F (36.8 C)  TempSrc: Oral  Oral Oral  SpO2: 99%  100%   Weight:  85 kg    Height:        Intake/Output Summary (Last 24 hours) at 01/01/2020 1405 Last data filed at 01/01/2020 1319 Gross per 24 hour  Intake 1680 ml  Output 650 ml  Net 1030 ml   Filed Weights   12/30/19 1310 12/31/19 0500 01/01/20 0500  Weight: 89.4 kg 90 kg 85 kg   Physical Exam:  General: A/O x1 (does not know where, when, why), pleasantly confused, positive  acute respiratory distress Eyes: negative scleral hemorrhage, negative anisocoria, negative icterus ENT: Negative Runny nose, negative gingival bleeding, Neck:  Negative scars, masses, torticollis, lymphadenopathy, JVD Lungs: Clear to auscultation bilaterally without wheezes or crackles Cardiovascular: Regular rate and rhythm without murmur gallop or rub normal S1 and S2 Abdomen: negative abdominal pain, nondistended, positive soft, bowel sounds, no rebound, no ascites, no appreciable mass Extremities: No  significant cyanosis, clubbing, or edema bilateral lower extremities Skin: Negative rashes, lesions, ulcers Psychiatric:  Negative depression, negative anxiety, negative fatigue, negative mania  Central nervous system:  Cranial nerves II through XII intact, tongue/uvula midline, all extremities muscle strength 5/5, sensation intact throughout, negative dysarthria, negative expressive aphasia, negative receptive aphasia.   .     Data Reviewed: Care during the described time interval was provided by me .  I have reviewed this patient's available data, including medical history, events of note, physical examination, and all test results as part of my evaluation.   CBC: Recent Labs  Lab 12/29/19 1546 12/30/19 1513 12/31/19 0100 01/01/20 0126  WBC 9.4 8.9 9.9 9.7  NEUTROABS 6.1 6.8 6.6 7.4  HGB 12.6* 13.3 12.5* 11.6*  HCT 41.3 42.7 39.6 38.0*  MCV 96.5 93.4 94.7 94.8  PLT 457* 477* 433* 638*   Basic Metabolic Panel: Recent Labs  Lab 12/29/19 1546 12/30/19 1513 12/31/19 0100 01/01/20 0126  NA 138 141 138 137  K  4.6 4.3 4.1 4.1  CL 95* 91* 93* 92*  CO2 37* 37* 36* 35*  GLUCOSE 115* 143* 111* 107*  BUN 20 23 25* 33*  CREATININE 1.13 1.13 1.18 1.18  CALCIUM 8.4* 8.4* 8.0* 8.3*  MG  --  1.7 1.8 1.8  PHOS  --  3.7 4.3 4.1   GFR: Estimated Creatinine Clearance: 47.7 mL/min (by C-G formula based on SCr of 1.18 mg/dL). Liver Function Tests: Recent Labs  Lab 12/29/19 1546 12/30/19 1513 12/31/19 0100 01/01/20 0126  AST _0 ALT _1 ALKPHOS 51 53 45 42  BILITOT 0.4 0.6 0.5 0.2*  PROT 7.1 6.9 6.3* 6.1*  ALBUMIN 2.9* 2.7* 2.5* 2.5*   No results for input(s): LIPASE, AMYLASE in the last 168 hours. No results for input(s): AMMONIA in the last 168 hours. Coagulation Profile: No results for input(s): INR, PROTIME in the last 168 hours. Cardiac Enzymes: No results for input(s): CKTOTAL, CKMB, CKMBINDEX, TROPONINI in the last 168 hours. BNP (last 3  results) No results for input(s): PROBNP in the last 8760 hours. HbA1C: Recent Labs    12/31/19 0110  HGBA1C 7.0*   CBG: No results for input(s): GLUCAP in the last 168 hours. Lipid Profile: Recent Labs    12/29/19 2356 12/31/19 0100  CHOL  --  141  HDL  --  32*  LDLCALC  --  97  TRIG 54 61  CHOLHDL  --  4.4   Thyroid Function Tests: No results for input(s): TSH, T4TOTAL, FREET4, T3FREE, THYROIDAB in the last 72 hours. Anemia Panel: Recent Labs    12/31/19 0100 01/01/20 0126  FERRITIN 39 38   Urine analysis:    Component Value Date/Time   COLORURINE YELLOW 08/31/2018 1342   APPEARANCEUR CLEAR 08/31/2018 1342   LABSPEC 1.025 08/31/2018 1342   PHURINE 5.5 08/31/2018 1342   GLUCOSEU NEGATIVE 08/31/2018 1342   HGBUR SMALL (A) 08/31/2018 1342   HGBUR negative 07/13/2008 1535   BILIRUBINUR negative 07/29/2019 1022   KETONESUR NEGATIVE 08/31/2018 1342   PROTEINUR Positive (A) 07/29/2019 1022   PROTEINUR 30 (A) 08/31/2018 1342   UROBILINOGEN 0.2 07/29/2019 1022   UROBILINOGEN 1.0 04/21/2011 0643   NITRITE negative 07/29/2019 1022   NITRITE NEGATIVE 08/31/2018 1342   LEUKOCYTESUR Negative 07/29/2019 1022   Sepsis Labs: _2 (procalcitonin:4,lacticidven:4)  ) Recent Results (from the past 240 hour(s))  SARS Coronavirus 2 Ag (30 min TAT) - Nasal Swab (BD Veritor Kit)     Status: None   Collection Time: 12/29/19  4:13 PM   Specimen: Nasal Swab (BD Veritor Kit)  Result Value Ref Range Status   SARS Coronavirus 2 Ag NEGATIVE NEGATIVE Final    Comment: (NOTE) SARS-CoV-2 antigen NOT DETECTED.  Negative results are presumptive.  Negative results do not preclude SARS-CoV-2 infection and should not be used as the sole basis for treatment or other patient management decisions, including infection  control decisions, particularly in the presence of clinical signs and  symptoms consistent with COVID-19, or in those who have been in contact with the virus.  Negative  results must be combined with clinical observations, patient history, and epidemiological information. The expected result is Negative. Fact Sheet for Patients: PodPark.tn Fact Sheet for Healthcare Providers: GiftContent.is This test is not yet approved or cleared by the [place] FDA and  has been authorized for detection and/or diagnosis of SARS-CoV-2 by FDA under an Emergency Use Authorization (EUA).  This EUA will remain in effect (meaning this  test can be used) for the duration of  the COVID-19 de claration under Section 564(b)(1) of the Act, 21 U.S.C. section 360bbb-3(b)(1), unless the authorization is terminated or revoked sooner. Performed at [facility], [address] [place], [place] [address]   SARS Coronavirus 2 by RT PCR (hospital order, performed in [facility] hospital lab) Nasopharyngeal Nasopharyngeal Swab     Status: Abnormal   Collection Time: 12/29/19  8:30 PM   Specimen: Nasopharyngeal Swab  Result Value Ref Range Status   SARS Coronavirus 2 POSITIVE (A) NEGATIVE Final    Comment: RESULT CALLED TO, READ BACK BY AND VERIFIED WITH: KELLIE NEAL RN AT 2311 ON 12/29/19 BY I.SUGUT (NOTE) SARS-CoV-2 target nucleic acids are DETECTED SARS-CoV-2 RNA is generally detectable in upper respiratory specimens  during the acute phase of infection.  Positive results are indicative  of the presence of the identified virus, but do not rule out bacterial infection or co-infection with other pathogens not detected by the test.  Clinical correlation with patient history and  other diagnostic information is necessary to determine patient infection status.  The expected result is negative. Fact Sheet for Patients:   StrictlyIdeas.no  Fact Sheet for Healthcare Providers:   BankingDealers.co.za   This test is not yet approved or cleared by the [place] FDA and   has been authorized for detection and/or diagnosis of SARS-CoV-2 by FDA under an Emergency Use Authorization (EUA).  This EUA will remain in effect (meaning this t est can be used) for the duration of  the COVID-19 declaration under Section 564(b)(1) of the Act, 21 U.S.C. section 360-bbb-3(b)(1), unless the authorization is terminated or revoked sooner. Performed at [facility], [address] [place], [place] [address]   Blood Culture (routine x 2)     Status: None (Preliminary result)   Collection Time: 12/29/19 11:59 PM   Specimen: BLOOD  Result Value Ref Range Status   Specimen Description   Final    BLOOD LEFT ANTECUBITAL Performed at [facility], [address] [place], [place] [address]    Special Requests   Final    BOTTLES DRAWN AEROBIC AND ANAEROBIC Blood Culture adequate volume Performed at [facility], [address] [place], [place] [address]    Culture  Setup Time   Final    GRAM POSITIVE COCCI AEROBIC BOTTLE ONLY Organism ID to follow    Culture   Final    NO GROWTH 2 DAYS Performed at [facility], [place] [address] [place],  [address]    Report Status PENDING  Incomplete  Blood Culture ID Panel (Reflexed)     Status: Abnormal   Collection Time: 12/29/19 11:59 PM  Result Value Ref Range Status   Enterococcus species NOT DETECTED NOT DETECTED Final   Listeria monocytogenes NOT DETECTED NOT DETECTED Final   Staphylococcus species DETECTED (A) NOT DETECTED Final    Comment: Methicillin (oxacillin) susceptible coagulase negative staphylococcus. Possible blood culture contaminant (unless isolated from more than one blood culture draw or clinical case suggests pathogenicity). No antibiotic treatment is indicated for blood  culture contaminants. CRITICAL VALUE NOTED.  VALUE IS CONSISTENT WITH PREVIOUSLY REPORTED AND CALLED VALUE. SEE [id number]    Staphylococcus aureus (BCID) NOT DETECTED NOT DETECTED Final    Methicillin resistance NOT DETECTED NOT DETECTED Final   Streptococcus species NOT DETECTED NOT DETECTED Final   Streptococcus agalactiae NOT DETECTED NOT DETECTED Final   Streptococcus  pneumoniae NOT DETECTED NOT DETECTED Final   Streptococcus pyogenes NOT DETECTED NOT DETECTED Final   Acinetobacter baumannii NOT DETECTED NOT DETECTED Final   Enterobacteriaceae species NOT DETECTED NOT DETECTED Final   Enterobacter cloacae complex NOT DETECTED NOT DETECTED Final   Escherichia coli NOT DETECTED NOT DETECTED Final   Klebsiella oxytoca NOT DETECTED NOT DETECTED Final   Klebsiella pneumoniae NOT DETECTED NOT DETECTED Final   Proteus species NOT DETECTED NOT DETECTED Final   Serratia marcescens NOT DETECTED NOT DETECTED Final   Haemophilus influenzae NOT DETECTED NOT DETECTED Final   Neisseria meningitidis NOT DETECTED NOT DETECTED Final   Pseudomonas aeruginosa NOT DETECTED NOT DETECTED Final   Candida albicans NOT DETECTED NOT DETECTED Final   Candida glabrata NOT DETECTED NOT DETECTED Final   Candida krusei NOT DETECTED NOT DETECTED Final   Candida parapsilosis NOT DETECTED NOT DETECTED Final   Candida tropicalis NOT DETECTED NOT DETECTED Final    Comment: Performed at [facility], [place] [address] [place], [place] [address]  Blood Culture (routine x 2)     Status: Abnormal (Preliminary result)   Collection Time: 12/30/19 12:05 AM   Specimen: BLOOD LEFT WRIST  Result Value Ref Range Status   Specimen Description   Final    BLOOD LEFT WRIST Performed at [facility], [address] [place], [place] [address]    Special Requests   Final    BOTTLES DRAWN AEROBIC AND ANAEROBIC Blood Culture adequate volume Performed at [facility], [address] [place], [place] [address]    Culture  Setup Time   Final    GRAM POSITIVE COCCI IN CLUSTERS IN BOTH AEROBIC AND ANAEROBIC BOTTLES CRITICAL RESULT CALLED TO, READ BACK BY AND VERIFIED WITH: K AMEND PHARMD [id number]  12/30/19 A BROWNING    Culture (A)  Final    STAPHYLOCOCCUS SPECIES (COAGULASE NEGATIVE) THE SIGNIFICANCE OF ISOLATING THIS ORGANISM FROM A SINGLE SET OF BLOOD CULTURES WHEN MULTIPLE SETS ARE DRAWN IS UNCERTAIN. PLEASE NOTIFY THE MICROBIOLOGY DEPARTMENT WITHIN ONE WEEK IF SPECIATION AND SENSITIVITIES ARE REQUIRED. Performed at [facility], [place] [address] [place], [place] [address]    Report Status PENDING  Incomplete  Respiratory Panel by PCR     Status: None   Collection Time: 12/30/19 12:22 PM   Specimen: Nasopharyngeal Swab; Respiratory  Result Value Ref Range Status   Adenovirus NOT DETECTED NOT DETECTED Final   Coronavirus 229E NOT DETECTED NOT DETECTED Final    Comment: (NOTE) The Coronavirus on the Respiratory Panel, DOES NOT test for the novel  Coronavirus (2019 nCoV)    Coronavirus HKU1 NOT DETECTED NOT DETECTED Final   Coronavirus NL63 NOT DETECTED NOT DETECTED Final   Coronavirus OC43 NOT DETECTED NOT DETECTED Final   Metapneumovirus NOT DETECTED NOT DETECTED Final   Rhinovirus / Enterovirus NOT DETECTED NOT DETECTED Final   Influenza A NOT DETECTED NOT DETECTED Final   Influenza B NOT DETECTED NOT DETECTED Final   Parainfluenza Virus 1 NOT DETECTED NOT DETECTED Final   Parainfluenza Virus 2 NOT DETECTED NOT DETECTED Final   Parainfluenza Virus 3 NOT DETECTED NOT DETECTED Final   Parainfluenza Virus 4 NOT DETECTED NOT DETECTED Final   Respiratory Syncytial Virus NOT DETECTED NOT DETECTED Final   Bordetella pertussis NOT DETECTED NOT DETECTED Final   Chlamydophila pneumoniae NOT DETECTED NOT DETECTED Final   Mycoplasma pneumoniae NOT DETECTED NOT DETECTED Final    Comment: Performed at [facility], [address], [place], [place] [address]  SARS CORONAVIRUS 2 (TAT 6-24 HRS) Nasopharyngeal Nasopharyngeal Swab     Status: Abnormal   Collection Time: 12/30/19  2:49 PM   Specimen: Nasopharyngeal Swab  Result Value Ref Range Status   SARS Coronavirus 2  POSITIVE (A) NEGATIVE Final    Comment: RESULT CALLED TO, READ BACK BY AND VERIFIED WITH: M.KENNEY,RN 0017 12/31/19 G.MCADOO (NOTE) SARS-CoV-2 target nucleic acids are DETECTED. The SARS-CoV-2 RNA is generally detectable in upper and lower respiratory specimens during the acute phase of infection. Positive results are indicative of the presence of SARS-CoV-2 RNA. Clinical correlation with patient history and other diagnostic information is  necessary to determine patient infection status. Positive results do not rule out bacterial infection or co-infection with other viruses.  The expected result is Negative. Fact Sheet for Patients: SugarRoll.be Fact Sheet for Healthcare Providers: https://www.-mathews.com/ This test is not yet approved or cleared by the [place] FDA and  has been authorized for detection and/or diagnosis of SARS-CoV-2 by FDA under an Emergency Use Authorization (EUA). This EUA will remain  in effect (meaning this test can be used) for the du ration of the COVID-19 declaration under Section 564(b)(1) of the Act, 21 U.S.C. section 360bbb-3(b)(1), unless the authorization is terminated or revoked sooner. Performed at [facility], [place] [address] [place], [place] [address]   Culture, blood (routine x 2)     Status: None (Preliminary result)   Collection Time: 12/31/19  2:30 AM   Specimen: BLOOD LEFT WRIST  Result Value Ref Range Status   Specimen Description BLOOD LEFT WRIST  Final   Special Requests   Final    BOTTLES DRAWN AEROBIC ONLY Blood Culture adequate volume   Culture   Final    NO GROWTH < 24 HOURS Performed at [facility], [place] [address] [place], [place] [address]    Report Status PENDING  Incomplete  Culture, blood (routine x 2)     Status: None (Preliminary result)   Collection Time: 12/31/19  2:35 AM   Specimen: BLOOD  Result Value Ref Range Status   Specimen Description BLOOD RIGHT  ANTECUBITAL  Final   Special Requests   Final    BOTTLES DRAWN AEROBIC ONLY Blood Culture adequate volume   Culture  Setup Time   Final    AEROBIC BOTTLE ONLY GRAM POSITIVE COCCI CRITICAL RESULT CALLED TO, READ BACK BY AND VERIFIED WITH: KAREN AMEND [place] @ 1856 ON 01/01/20 BY ROBINSON Z.     Culture   Final    NO GROWTH < 24 HOURS Performed at [facility], [place] [address], [place], [place] [address]    Report Status PENDING  Incomplete         Radiology Studies: Portable chest 1 View  Result Date: 12/30/2019 CLINICAL DATA:  Shortness of breath. COVID-19. EXAM: PORTABLE CHEST 1 VIEW COMPARISON:  12/29/2019 FINDINGS: Small hazy peripheral infiltrates are again noted in both lungs consistent with viral pneumonia. Heart size and vascularity are normal. No significant change since the prior study considering differences in patient position. IMPRESSION: No change in the appearance of the chest since the prior study. Patchy peripheral hazy bilateral infiltrates. Electronically Signed   By: Lorriane Shire M.D.   On: 12/30/2019 15:32        Scheduled Meds: . alfuzosin  10 mg Oral Daily  . aspirin EC  81 mg Oral Daily  . atorvastatin  20 mg Oral q1800  .  dexamethasone (DECADRON) injection  6 mg Intravenous Q24H  . docusate sodium  100 mg Oral BID  . donepezil  10 mg Oral QHS  . enoxaparin (LOVENOX) injection  40 mg Subcutaneous Q24H  . finasteride  5 mg Oral Daily  . hydrALAZINE  10 mg Oral Q8H  . Ipratropium-Albuterol  1 puff Inhalation Q6H  . levothyroxine  75 mcg Oral Daily  . memantine  5 mg Oral BID  . pantoprazole  40 mg Oral Daily  . propranolol ER  60 mg Oral Daily  . QUEtiapine  100 mg Oral QHS  . venlafaxine XR  75 mg Oral Q breakfast   Continuous Infusions: . remdesivir 100 mg in NS 100 mL 100 mg (01/01/20 1023)     LOS: 2 days   The patient is critically ill with multiple organ systems failure and requires high complexity decision making for assessment and  support, frequent evaluation and titration of therapies, application of advanced monitoring technologies and extensive interpretation of multiple databases. Critical Care Time devoted to patient care services described in this note  Time spent: 40 minutes    Merton Wadlow, Geraldo Docker, MD [facility] Pager [phone]  If 7PM-7AM, please contact night-coverage [email or web address] Password [facility] 01/01/2020, 2:05 PM

## 2020-01-01 NOTE — Progress Notes (Signed)
Patient wallet taken to security

## 2020-01-01 NOTE — Progress Notes (Signed)
3PHARMACY - PHYSICIAN COMMUNICATION CRITICAL VALUE ALERT - BLOOD CULTURE IDENTIFICATION (BCID)  CONER KALLMEYER is an 86 y.o. male who presented to [facility] on 12/29/2019 with a chief complaint of COVID 19  Assessment:  Grew GPC in 2/4 blood cultures (both out of the same set) on 1/6 and now growing GPC in 1/2 blood cultures on 1/7. Given patient's normal WBC, neg PCT a few days ago, and lack of fever, these cultures are likely contaminants.   Name of physician (or Provider) Contacted: Dr. Sherral Hammers  Current antibiotics: None  Changes to prescribed antibiotics recommended:  Recommendations accepted by provider - no antibiotics to be added at this time  No results found for this or any previous visit.  Albertina Parr, PharmD., BCPS Clinical Pharmacist

## 2020-01-02 LAB — CBC WITH DIFFERENTIAL/PLATELET
Abs Immature Granulocytes: 0.04 10*3/uL (ref 0.00–0.07)
Basophils Absolute: 0 10*3/uL (ref 0.0–0.1)
Basophils Relative: 0 %
Eosinophils Absolute: 0 10*3/uL (ref 0.0–0.5)
Eosinophils Relative: 0 %
HCT: 39.1 % (ref 39.0–52.0)
Hemoglobin: 12.2 g/dL — ABNORMAL LOW (ref 13.0–17.0)
Immature Granulocytes: 0 %
Lymphocytes Relative: 18 %
Lymphs Abs: 1.8 10*3/uL (ref 0.7–4.0)
MCH: 29.8 pg (ref 26.0–34.0)
MCHC: 31.2 g/dL (ref 30.0–36.0)
MCV: 95.6 fL (ref 80.0–100.0)
Monocytes Absolute: 0.7 10*3/uL (ref 0.1–1.0)
Monocytes Relative: 7 %
Neutro Abs: 7.3 10*3/uL (ref 1.7–7.7)
Neutrophils Relative %: 75 %
Platelets: 457 10*3/uL — ABNORMAL HIGH (ref 150–400)
RBC: 4.09 MIL/uL — ABNORMAL LOW (ref 4.22–5.81)
RDW: 13.2 % (ref 11.5–15.5)
WBC: 9.8 10*3/uL (ref 4.0–10.5)
nRBC: 0 % (ref 0.0–0.2)

## 2020-01-02 LAB — PHOSPHORUS: Phosphorus: 3.8 mg/dL (ref 2.5–4.6)

## 2020-01-02 LAB — COMPREHENSIVE METABOLIC PANEL
ALT: 18 U/L (ref 0–44)
AST: 20 U/L (ref 15–41)
Albumin: 2.7 g/dL — ABNORMAL LOW (ref 3.5–5.0)
Alkaline Phosphatase: 39 U/L (ref 38–126)
Anion gap: 9 (ref 5–15)
BUN: 31 mg/dL — ABNORMAL HIGH (ref 8–23)
CO2: 37 mmol/L — ABNORMAL HIGH (ref 22–32)
Calcium: 8.6 mg/dL — ABNORMAL LOW (ref 8.9–10.3)
Chloride: 91 mmol/L — ABNORMAL LOW (ref 98–111)
Creatinine, Ser: 1.07 mg/dL (ref 0.61–1.24)
GFR calc Af Amer: >60 mL/min (ref >60)
GFR calc non Af Amer: >60 mL/min (ref >60)
Glucose, Bld: 113 mg/dL — ABNORMAL HIGH (ref 70–99)
Potassium: 4.6 mmol/L (ref 3.5–5.1)
Sodium: 137 mmol/L (ref 135–145)
Total Bilirubin: 0.6 mg/dL (ref 0.3–1.2)
Total Protein: 6.3 g/dL — ABNORMAL LOW (ref 6.5–8.1)

## 2020-01-02 LAB — C-REACTIVE PROTEIN: CRP: 1 mg/dL — ABNORMAL HIGH (ref <1.0)

## 2020-01-02 LAB — D-DIMER, QUANTITATIVE: D-Dimer, Quant: 1.95 ug/mL-FEU — ABNORMAL HIGH (ref 0.00–0.50)

## 2020-01-02 LAB — MAGNESIUM: Magnesium: 1.9 mg/dL (ref 1.7–2.4)

## 2020-01-02 LAB — FERRITIN: Ferritin: 35 ng/mL (ref 24–336)

## 2020-01-02 MED ORDER — HYDRALAZINE HCL 25 MG PO TABS
50.0000 mg | ORAL_TABLET | Freq: Four times a day (QID) | ORAL | Status: DC
  Administered 2020-01-02 – 2020-01-07 (×17): 50 mg via ORAL
  Filled 2020-01-02 (×17): qty 2

## 2020-01-02 MED ORDER — HYDRALAZINE HCL 25 MG PO TABS: 50.0000 mg | ORAL_TABLET | Freq: Three times a day (TID) | ORAL | Status: DC

## 2020-01-02 NOTE — Plan of Care (Signed)

## 2020-01-02 NOTE — Evaluation (Signed)
Physical Therapy Evaluation Patient Details Name: Douglas Grant MRN: [id number] DOB: 03/25/1930 Today's Date: 01/02/2020   History of Present Illness  86 year old WM PMHx dementia, CVA, HTN, chronic diastolic CHF, HLD, diabetes type 2 controlled without complication, previous tobacco abuse, thyroidism who presents with concern for shortness of breath from PCPs office where he was found to have hypoxia to the mid 80s on room air. Son in law has at bedside to give hx, pt progressively worse SOB and LE swelling w/ mild cough.   Clinical Impression   Pt admitted with above diagnosis. PTA mos info from chart pt is poor historian and is pleasantly confused. Pt states was ambulatory and independent. Pt currently with functional limitations due to the deficits listed below (see PT Problem List). This am pt was able to complete all tasks given with mod assist, was able to ambulate approx 45ft in room with RW pt was on 2L/min via [place] and was noted to desat to min of 84% with cues for pursed lip breathing and also flutter valve use able to recover and increase saturations to 90s within 2-9mins.  Pt will benefit from skilled PT to increase their independence and safety with mobility to allow discharge to the venue listed below.       Follow Up Recommendations SNF    Equipment Recommendations  None recommended by PT    Recommendations for Other Services OT consult     Precautions / Restrictions Precautions Precautions: Fall;Other (comment)(cognition was agitatted earlier) Restrictions Weight Bearing Restrictions: No      Mobility  Bed Mobility Overal bed mobility: Needs Assistance Bed Mobility: Supine to Sit;Sit to Supine     Supine to sit: Mod assist Sit to supine: Supervision;Min guard      Transfers Overall transfer level: Needs assistance Equipment used: Rolling walker (2 wheeled) Transfers: Sit to/from Stand Sit to Stand: Min assist;Mod assist             Ambulation/Gait Ambulation/Gait assistance: Mod assist Gait Distance (Feet): 46 Feet Assistive device: Rolling walker (2 wheeled) Gait Pattern/deviations: Step-to pattern;Narrow base of support;Trunk flexed Gait velocity: very slow   General Gait Details: 1x 6ft and 1 x 46 ft with RW and mod a cues needed for safety and to avoid obstacles  Stairs            Wheelchair Mobility    Modified Rankin (Stroke Patients Only)       Balance Overall balance assessment: Needs assistance Sitting-balance support: Feet supported Sitting balance-Leahy Scale: Fair Sitting balance - Comments: sits unuspported EOB     Standing balance-Leahy Scale: Fair Standing balance comment: with standing and ambulation with RW                             Pertinent Vitals/Pain Pain Assessment: No/denies pain    Home Living Family/patient expects to be discharged to:: Skilled nursing facility Living Arrangements: Alone               Additional Comments: unsure of living hx as pt is poor historian and family were already upset at pt being in this facility. he gives some hx of living at home but not sure of credibility    Prior Function           Comments: states was independent but once again unsure of credibility as pt is poor historian     Hand Dominance        Extremity/Trunk  Assessment   Upper Extremity Assessment Upper Extremity Assessment: Generalized weakness    Lower Extremity Assessment Lower Extremity Assessment: Generalized weakness       Communication   Communication: No difficulties  Cognition Arousal/Alertness: Awake/alert Behavior During Therapy: Restless Overall Cognitive Status: No family/caregiver present to determine baseline cognitive functioning                                 General Comments: was confused and agitated earlier on but at assessment seems to be able to floow cues etc, pt has tele sitter      General  Comments      Exercises Other Exercises Other Exercises: initiated flutter valve use   Assessment/Plan    PT Assessment Patient needs continued PT services  PT Problem List Decreased strength;Decreased activity tolerance;Decreased balance;Decreased mobility;Decreased coordination;Decreased cognition;Decreased safety awareness       PT Treatment Interventions DME instruction;Gait training;Functional mobility training;Therapeutic activities;Therapeutic exercise;Balance training;Neuromuscular re-education;Patient/family education    PT Goals (Current goals can be found in the Care Plan section)  Acute Rehab PT Goals Patient Stated Goal: did not voice any PT Goal Formulation: Patient unable to participate in goal setting Time For Goal Achievement: 01/16/20 Potential to Achieve Goals: Fair    Frequency Min 2X/week   Barriers to discharge        Co-evaluation               AM-PAC PT "6 Clicks" Mobility  Outcome Measure Help needed turning from your back to your side while in a flat bed without using bedrails?: A Little Help needed moving from lying on your back to sitting on the side of a flat bed without using bedrails?: A Little Help needed moving to and from a bed to a chair (including a wheelchair)?: A Lot Help needed standing up from a chair using your arms (e.g., wheelchair or bedside chair)?: A Lot Help needed to walk in hospital room?: A Lot Help needed climbing 3-5 steps with a railing? : Total 6 Click Score: 13    End of Session Equipment Utilized During Treatment: Gait belt;Oxygen Activity Tolerance: Patient limited by fatigue;Treatment limited secondary to medical complications (Comment) Patient left: in bed;with call bell/phone within reach;with bed alarm set Nurse Communication: Mobility status PT Visit Diagnosis: Other abnormalities of gait and mobility (R26.89);Muscle weakness (generalized) (M62.81)    Time: [id number] PT Time Calculation (min) (ACUTE  ONLY): 29 min   Charges:   PT Evaluation $PT Eval Moderate Complexity: 1 Mod PT Treatments $Gait Training: 8-22 mins        Horald Chestnut, PT   Delford Field 01/02/2020, 2:06 PM

## 2020-01-02 NOTE — Plan of Care (Signed)
  Problem: Respiratory: °Goal: Will maintain a patent airway °Outcome: Progressing °Goal: Complications related to the disease process, condition or treatment will be avoided or minimized °Outcome: Progressing °  °Problem: Clinical Measurements: °Goal: Ability to maintain clinical measurements within normal limits will improve °Outcome: Progressing °Goal: Will remain free from infection °Outcome: Progressing °Goal: Diagnostic test results will improve °Outcome: Progressing °Goal: Respiratory complications will improve °Outcome: Progressing °Goal: Cardiovascular complication will be avoided °Outcome: Progressing °  °Problem: Activity: °Goal: Risk for activity intolerance will decrease °Outcome: Progressing °  °Problem: Nutrition: °Goal: Adequate nutrition will be maintained °Outcome: Progressing °  °Problem: Elimination: °Goal: Will not experience complications related to bowel motility °Outcome: Progressing °Goal: Will not experience complications related to urinary retention °Outcome: Progressing °  °Problem: Pain Managment: °Goal: General experience of comfort will improve °Outcome: Progressing °  °Problem: Safety: °Goal: Ability to remain free from injury will improve °Outcome: Progressing °  ° °Problem: Skin Integrity: °Goal: Risk for impaired skin integrity will decrease °Outcome: Progressing °  °  °

## 2020-01-02 NOTE — Progress Notes (Signed)
Patient very agitated, pulling at lines. Has Engineer, maintenance (IT). Dr. Vanita Ingles paged with request for medication.

## 2020-01-02 NOTE — Progress Notes (Signed)
PROGRESS NOTE    Douglas Grant  ENI:[id number] DOB: [birth year] DOA: 12/29/2019 PCP: Douglas Pal, DO   Brief Narrative:  Douglas Grant  86 year old WM PMHx dementia, CVA, HTN, chronic diastolic CHF, HLD, diabetes type 2 controlled without complication, previous tobacco abuse, thyroidism  Presents with concern for shortness of breathfrom PCPs office where he was found to have hypoxia to the mid 80s on room air.  Son-in-law is at bedside andassisting with history.Reports he has had increasing shortness of breath over the last 3 weeks.Reports it has progressively been getting worse. Son-in-law reports that his lower extremity swelling has also increased. Reports that the patient had said that he could not sleep at night due to shortness of breath when he was laying down. The patient does report that he feels somewhat better when he sits up compared to when he lays down. Has no prior history of CHF. No history of COPD or asthma although he was a smoker for many years. Has never been on inhalers. Denies fevers, sick contacts. Reports some mild cough, but largestconcern of shortness of breath. No n/v/fevers/diarrhea/body aches. He has chronic nasal congestion which is unchanged.    Subjective: 1/9 afebrile last 24 hours A/O x1 (does not know where, when, why), pleasantly confused.  Stated in the [employer] during the [place] [employer].   Assessment & Plan:   Active Problems:   Hypothyroidism   Essential hypertension   Stroke (HCC)   CHF (congestive heart failure) (HCC)   Acute respiratory failure with hypoxia ([place])   COVID-19 virus infection   Pneumonia due to COVID-19 virus   Diabetes mellitus type 2, controlled, without complications (HCC)   Chronic diastolic CHF (congestive heart failure) (HCC)   HLD (hyperlipidemia)   Pressure injury of skin   Covid pneumonia/acute respiratory failure with hypoxia COVID-19 Labs  Recent Labs    12/30/19 1513 12/31/19 0100  01/01/20 0126 01/02/20 0358  DDIMER 3.15* 2.51* 1.89* 1.95*  FERRITIN 49 39 38 35  LDH 211*  --   --   --   CRP 2.9* 2.1* 1.3* 1.0*    Lab Results  Component Value Date   SARSCOV2NAA POSITIVE (A) 12/30/2019   SARSCOV2NAA POSITIVE (A) 12/29/2019   -Family upset they had instructed ED not to transport patient to [facility] until family member had a ride because they did not believe patient positive for Covid pneumonia.. -Family member informed that a false positive extremely unlikely and that a repeat Covid test is not standard of care.  However in order to appease family at their request a second Covid test ordered.   -Decadron 6 mg daily -Remdesivir per pharmacy protocol -Currently patient does not meet criteria for Actemra or Covid convalescent plasma -1/8 ambulatory SPO2 pending  Chronic diastolic CHF -Strict in and out -1.3 L -Daily weight Filed Weights   12/31/19 0500 01/01/20 0500 01/02/20 0500  Weight: 90 kg 85 kg 85.5 kg  -1/9 increase Hydralazine po 50 mg QID -Propranolol ER 60 mg daily  Essential HTN -See CHF  Dementia/Hx stroke -Unknown baseline  Diabetes type 2 controlled without complication -1/7 hemoglobin A1c= 7.0 -Sensitive SSI  HLD -1/7 LDL= 97 -1/7 Lipitor 20 mg daily  Hypothyroidism -Synthroid 75 mcg daily  Stage I buttocks ulcer bilateral Pressure Injury 12/30/19 Buttocks Bilateral Stage 1 -  Intact skin with non-blanchable redness of a localized area usually over a bony prominence. (Active)  12/30/19 1230  Location: Buttocks  Location Orientation: Bilateral  Staging: Stage 1 -  Intact  skin with non-blanchable redness of a localized area usually over a bony prominence.  Wound Description (Comments):   Present on Admission: Yes   Goals of care -1/8 PT/OT consult; evaluate for SNF  -1/9 consult LCSW; patient cannot return home as all 3 of his children work in the healthcare field and would need to be quarantined themselves.  Obtain SNF   placement    DVT prophylaxis: Lovenox Code Status: Full Family Communication: 01/01/2020 spoke with Douglas Grant (son) [phone].  Counseled on plan of care answered all questions Disposition Plan: TBD   Consultants:    Procedures/Significant Events:     I have personally reviewed and interpreted all radiology studies and my findings are as above.  VENTILATOR SETTINGS: Nasal cannula 1/9 Flow; 2 L/min SPO2 95%   Cultures   Antimicrobials:  12/30/19 1600  remdesivir 100 mg in sodium chloride 0.9 % 100 mL IVPB     100 mg 200 mL/hr over 30 Minutes 01/03/20 0959   12/30/19 1230  remdesivir 200 mg in sodium chloride 0.9% 250 mL IVPB  Status:  Discontinued     200 mg 580 mL/hr over 30 Minutes 12/30/19 1234   12/30/19 0000  remdesivir 100 mg in sodium chloride 0.9 % 100 mL IVPB     100 mg 200 mL/hr over 30 Minutes 12/30/19 0848       Devices    LINES / TUBES:      Continuous Infusions: . remdesivir 100 mg in NS 100 mL 100 mg (01/01/20 1023)     Objective: Vitals:   01/01/20 1652 01/01/20 1935 01/02/20 0352 01/02/20 0500  BP: (!) 166/82 (!) 147/56 (!) 162/64   Pulse: (!) 57 (!) 56 62   Resp: 15 15 16    Temp: 99.5 F (37.5 C) 98.6 F (37 C) 98.2 F (36.8 C)   TempSrc: Oral Oral Oral   SpO2: 92% 93% 92%   Weight:    85.5 kg  Height:        Intake/Output Summary (Last 24 hours) at 01/02/2020 8850 Last data filed at 01/01/2020 2330 Gross per 24 hour  Intake 1440 ml  Output 575 ml  Net 865 ml   Filed Weights   12/31/19 0500 01/01/20 0500 01/02/20 0500  Weight: 90 kg 85 kg 85.5 kg   Physical Exam:  General: A/O x1 (does not know where, when, why), pleasantly confused, positive  acute respiratory distress Eyes: negative scleral hemorrhage, negative anisocoria, negative icterus ENT: Negative Runny nose, negative gingival bleeding, Neck:  Negative scars, masses, torticollis, lymphadenopathy, JVD Lungs: Clear to auscultation bilaterally without wheezes or  crackles Cardiovascular: Regular rate and rhythm without murmur gallop or rub normal S1 and S2 Abdomen: negative abdominal pain, nondistended, positive soft, bowel sounds, no rebound, no ascites, no appreciable mass Extremities: No significant cyanosis, clubbing, or edema bilateral lower extremities Skin: Negative rashes, lesions, ulcers Psychiatric:  Negative depression, negative anxiety, negative fatigue, negative mania  Central nervous system:  Cranial nerves II through XII intact, tongue/uvula midline, all extremities muscle strength 5/5, sensation intact throughout, negative dysarthria, negative expressive aphasia, negative receptive aphasia.   .     Data Reviewed: Care during the described time interval was provided by me .  I have reviewed this patient's available data, including medical history, events of note, physical examination, and all test results as part of my evaluation.   CBC: Recent Labs  Lab 12/29/19 1546 12/30/19 1513 12/31/19 0100 01/01/20 0126 01/02/20 0358  WBC 9.4 8.9 9.9 9.7 9.8  NEUTROABS 6.1 6.8 6.6 7.4 7.3  HGB 12.6* 13.3 12.5* 11.6* 12.2*  HCT 41.3 42.7 39.6 38.0* 39.1  MCV 96.5 93.4 94.7 94.8 95.6  PLT 457* 477* 433* 437* 300*   Basic Metabolic Panel: Recent Labs  Lab 12/29/19 1546 12/30/19 1513 12/31/19 0100 01/01/20 0126 01/02/20 0358  NA 138 141 138 137 137  K 4.6 4.3 4.1 4.1 4.6  CL 95* 91* 93* 92* 91*  CO2 37* 37* 36* 35* 37*  GLUCOSE 115* 143* 111* 107* 113*  BUN 20 23 25* 33* 31*  CREATININE 1.13 1.13 1.18 1.18 1.07  CALCIUM 8.4* 8.4* 8.0* 8.3* 8.6*  MG  --  1.7 1.8 1.8 1.9  PHOS  --  3.7 4.3 4.1 3.8   GFR: Estimated Creatinine Clearance: 52.7 mL/min (by C-G formula based on SCr of 1.07 mg/dL). Liver Function Tests: Recent Labs  Lab 12/29/19 1546 12/30/19 1513 12/31/19 0100 01/01/20 0126 01/02/20 0358  AST 15 18 17 18 20   ALT 19 20 18 16 18   ALKPHOS 51 53 45 42 39  BILITOT 0.4 0.6 0.5 0.2* 0.6  PROT 7.1 6.9 6.3* 6.1*  6.3*  ALBUMIN 2.9* 2.7* 2.5* 2.5* 2.7*   No results for input(s): LIPASE, AMYLASE in the last 168 hours. No results for input(s): AMMONIA in the last 168 hours. Coagulation Profile: No results for input(s): INR, PROTIME in the last 168 hours. Cardiac Enzymes: No results for input(s): CKTOTAL, CKMB, CKMBINDEX, TROPONINI in the last 168 hours. BNP (last 3 results) No results for input(s): PROBNP in the last 8760 hours. HbA1C: Recent Labs    12/31/19 0110  HGBA1C 7.0*   CBG: No results for input(s): GLUCAP in the last 168 hours. Lipid Profile: Recent Labs    12/31/19 0100  CHOL 141  HDL 32*  LDLCALC 97  TRIG 61  CHOLHDL 4.4   Thyroid Function Tests: No results for input(s): TSH, T4TOTAL, FREET4, T3FREE, THYROIDAB in the last 72 hours. Anemia Panel: Recent Labs    01/01/20 0126 01/02/20 0358  FERRITIN 38 35   Urine analysis:    Component Value Date/Time   COLORURINE YELLOW 08/31/2018 1342   APPEARANCEUR CLEAR 08/31/2018 1342   LABSPEC 1.025 08/31/2018 1342   PHURINE 5.5 08/31/2018 1342   GLUCOSEU NEGATIVE 08/31/2018 1342   HGBUR SMALL (A) 08/31/2018 1342   HGBUR negative 07/13/2008 1535   BILIRUBINUR negative 07/29/2019 1022   KETONESUR NEGATIVE 08/31/2018 1342   PROTEINUR Positive (A) 07/29/2019 1022   PROTEINUR 30 (A) 08/31/2018 1342   UROBILINOGEN 0.2 07/29/2019 1022   UROBILINOGEN 1.0 04/21/2011 0643   NITRITE negative 07/29/2019 1022   NITRITE NEGATIVE 08/31/2018 1342   LEUKOCYTESUR Negative 07/29/2019 1022   Sepsis Labs: @LABRCNTIP (procalcitonin:4,lacticidven:4)  ) Recent Results (from the past 240 hour(s))  SARS Coronavirus 2 Ag (30 min TAT) - Nasal Swab (BD Veritor Kit)     Status: None   Collection Time: 12/29/19  4:13 PM   Specimen: Nasal Swab (BD Veritor Kit)  Result Value Ref Range Status   SARS Coronavirus 2 Ag NEGATIVE NEGATIVE Final    Comment: (NOTE) SARS-CoV-2 antigen NOT DETECTED.  Negative results are presumptive.  Negative results  do not preclude SARS-CoV-2 infection and should not be used as the sole basis for treatment or other patient management decisions, including infection  control decisions, particularly in the presence of clinical signs and  symptoms consistent with COVID-19, or in those who have been in contact with the virus.  Negative results must be combined with  clinical observations, patient history, and epidemiological information. The expected result is Negative. Fact Sheet for Patients: PodPark.tn Fact Sheet for Healthcare Providers: GiftContent.is This test is not yet approved or cleared by the [place] FDA and  has been authorized for detection and/or diagnosis of SARS-CoV-2 by FDA under an Emergency Use Authorization (EUA).  This EUA will remain in effect (meaning this test can be used) for the duration of  the COVID-19 de claration under Section 564(b)(1) of the Act, 21 U.S.C. section 360bbb-3(b)(1), unless the authorization is terminated or revoked sooner. Performed at [facility], [address] [place], [place] [address]   SARS Coronavirus 2 by RT PCR (hospital order, performed in [facility] hospital lab) Nasopharyngeal Nasopharyngeal Swab     Status: Abnormal   Collection Time: 12/29/19  8:30 PM   Specimen: Nasopharyngeal Swab  Result Value Ref Range Status   SARS Coronavirus 2 POSITIVE (A) NEGATIVE Final    Comment: RESULT CALLED TO, READ BACK BY AND VERIFIED WITH: KELLIE NEAL RN AT 2311 ON 12/29/19 BY I.SUGUT (NOTE) SARS-CoV-2 target nucleic acids are DETECTED SARS-CoV-2 RNA is generally detectable in upper respiratory specimens  during the acute phase of infection.  Positive results are indicative  of the presence of the identified virus, but do not rule out bacterial infection or co-infection with other pathogens not detected by the test.  Clinical correlation with patient history and  other diagnostic  information is necessary to determine patient infection status.  The expected result is negative. Fact Sheet for Patients:   StrictlyIdeas.no  Fact Sheet for Healthcare Providers:   BankingDealers.co.za   This test is not yet approved or cleared by the [place] FDA and  has been authorized for detection and/or diagnosis of SARS-CoV-2 by FDA under an Emergency Use Authorization (EUA).  This EUA will remain in effect (meaning this t est can be used) for the duration of  the COVID-19 declaration under Section 564(b)(1) of the Act, 21 U.S.C. section 360-bbb-3(b)(1), unless the authorization is terminated or revoked sooner. Performed at [facility], [address] [place], [place] [address]   Blood Culture (routine x 2)     Status: None (Preliminary result)   Collection Time: 12/29/19 11:59 PM   Specimen: BLOOD  Result Value Ref Range Status   Specimen Description   Final    BLOOD LEFT ANTECUBITAL Performed at [facility], [address] [place], [place] [address]    Special Requests   Final    BOTTLES DRAWN AEROBIC AND ANAEROBIC Blood Culture adequate volume Performed at [facility], [address] [place], [place] [address]    Culture  Setup Time GRAM POSITIVE COCCI AEROBIC BOTTLE ONLY   Final   Culture   Final    GRAM POSITIVE COCCI CULTURE REINCUBATED FOR BETTER GROWTH Performed at [facility], [place] [address] [place],  [address]    Report Status PENDING  Incomplete  Blood Culture ID Panel (Reflexed)     Status: Abnormal   Collection Time: 12/29/19 11:59 PM  Result Value Ref Range Status   Enterococcus species NOT DETECTED NOT DETECTED Final   Listeria monocytogenes NOT DETECTED NOT DETECTED Final   Staphylococcus species DETECTED (A) NOT DETECTED Final    Comment: Methicillin (oxacillin) susceptible coagulase negative staphylococcus. Possible blood culture contaminant  (unless isolated from more than one blood culture draw or clinical case suggests pathogenicity). No antibiotic treatment is indicated for  blood  culture contaminants. CRITICAL VALUE NOTED.  VALUE IS CONSISTENT WITH PREVIOUSLY REPORTED AND CALLED VALUE. SEE [id number]    Staphylococcus aureus (BCID) NOT DETECTED NOT DETECTED Final   Methicillin resistance NOT DETECTED NOT DETECTED Final   Streptococcus species NOT DETECTED NOT DETECTED Final   Streptococcus agalactiae NOT DETECTED NOT DETECTED Final   Streptococcus pneumoniae NOT DETECTED NOT DETECTED Final   Streptococcus pyogenes NOT DETECTED NOT DETECTED Final   Acinetobacter baumannii NOT DETECTED NOT DETECTED Final   Enterobacteriaceae species NOT DETECTED NOT DETECTED Final   Enterobacter cloacae complex NOT DETECTED NOT DETECTED Final   Escherichia coli NOT DETECTED NOT DETECTED Final   Klebsiella oxytoca NOT DETECTED NOT DETECTED Final   Klebsiella pneumoniae NOT DETECTED NOT DETECTED Final   Proteus species NOT DETECTED NOT DETECTED Final   Serratia marcescens NOT DETECTED NOT DETECTED Final   Haemophilus influenzae NOT DETECTED NOT DETECTED Final   Neisseria meningitidis NOT DETECTED NOT DETECTED Final   Pseudomonas aeruginosa NOT DETECTED NOT DETECTED Final   Candida albicans NOT DETECTED NOT DETECTED Final   Candida glabrata NOT DETECTED NOT DETECTED Final   Candida krusei NOT DETECTED NOT DETECTED Final   Candida parapsilosis NOT DETECTED NOT DETECTED Final   Candida tropicalis NOT DETECTED NOT DETECTED Final    Comment: Performed at [facility], [address], [place], [place] [address]  Blood Culture (routine x 2)     Status: Abnormal (Preliminary result)   Collection Time: 12/30/19 12:05 AM   Specimen: BLOOD LEFT WRIST  Result Value Ref Range Status   Specimen Description   Final    BLOOD LEFT WRIST Performed at [facility], [address] [place], [place] [address]    Special Requests   Final     BOTTLES DRAWN AEROBIC AND ANAEROBIC Blood Culture adequate volume Performed at [facility], [address] [place], [place] [address]    Culture  Setup Time   Final    GRAM POSITIVE COCCI IN CLUSTERS IN BOTH AEROBIC AND ANAEROBIC BOTTLES CRITICAL RESULT CALLED TO, READ BACK BY AND VERIFIED WITH: K AMEND PHARMD 9024 12/30/19 A BROWNING    Culture (A)  Final    STAPHYLOCOCCUS SPECIES (COAGULASE NEGATIVE) THE SIGNIFICANCE OF ISOLATING THIS ORGANISM FROM A SINGLE SET OF BLOOD CULTURES WHEN MULTIPLE SETS ARE DRAWN IS UNCERTAIN. PLEASE NOTIFY THE MICROBIOLOGY DEPARTMENT WITHIN ONE WEEK IF SPECIATION AND SENSITIVITIES ARE REQUIRED. Performed at [facility], [place] [address], [place], [place] [address]    Report Status PENDING  Incomplete  Respiratory Panel by PCR     Status: None   Collection Time: 12/30/19 12:22 PM   Specimen: Nasopharyngeal Swab; Respiratory  Result Value Ref Range Status   Adenovirus NOT DETECTED NOT DETECTED Final   Coronavirus 229E NOT DETECTED NOT DETECTED Final    Comment: (NOTE) The Coronavirus on the Respiratory Panel, DOES NOT test for the novel  Coronavirus (2019 nCoV)    Coronavirus HKU1 NOT DETECTED NOT DETECTED Final   Coronavirus NL63 NOT DETECTED NOT DETECTED Final   Coronavirus OC43 NOT DETECTED NOT DETECTED Final   Metapneumovirus NOT DETECTED NOT DETECTED Final   Rhinovirus / Enterovirus NOT DETECTED NOT DETECTED Final   Influenza A NOT DETECTED NOT DETECTED Final   Influenza B NOT DETECTED NOT DETECTED Final   Parainfluenza Virus 1 NOT DETECTED NOT DETECTED Final   Parainfluenza Virus 2 NOT DETECTED NOT DETECTED Final   Parainfluenza Virus 3 NOT  DETECTED NOT DETECTED Final   Parainfluenza Virus 4 NOT DETECTED NOT DETECTED Final   Respiratory Syncytial Virus NOT DETECTED NOT DETECTED Final   Bordetella pertussis NOT DETECTED NOT DETECTED Final   Chlamydophila pneumoniae NOT DETECTED NOT DETECTED Final   Mycoplasma pneumoniae NOT  DETECTED NOT DETECTED Final    Comment: Performed at [facility], [place] [address], [place], [place] [address]  SARS CORONAVIRUS 2 (TAT 6-24 HRS) Nasopharyngeal Nasopharyngeal Swab     Status: Abnormal   Collection Time: 12/30/19  2:49 PM   Specimen: Nasopharyngeal Swab  Result Value Ref Range Status   SARS Coronavirus 2 POSITIVE (A) NEGATIVE Final    Comment: RESULT CALLED TO, READ BACK BY AND VERIFIED WITH: M.KENNEY,RN 0017 12/31/19 G.MCADOO (NOTE) SARS-CoV-2 target nucleic acids are DETECTED. The SARS-CoV-2 RNA is generally detectable in upper and lower respiratory specimens during the acute phase of infection. Positive results are indicative of the presence of SARS-CoV-2 RNA. Clinical correlation with patient history and other diagnostic information is  necessary to determine patient infection status. Positive results do not rule out bacterial infection or co-infection with other viruses.  The expected result is Negative. Fact Sheet for Patients: SugarRoll.be Fact Sheet for Healthcare Providers: https://www.-mathews.com/ This test is not yet approved or cleared by the [place] FDA and  has been authorized for detection and/or diagnosis of SARS-CoV-2 by FDA under an Emergency Use Authorization (EUA). This EUA will remain  in effect (meaning this test can be used) for the du ration of the COVID-19 declaration under Section 564(b)(1) of the Act, 21 U.S.C. section 360bbb-3(b)(1), unless the authorization is terminated or revoked sooner. Performed at [facility], [place] [address] [place], [place] [address]   Culture, blood (routine x 2)     Status: None (Preliminary result)   Collection Time: 12/31/19  2:30 AM   Specimen: BLOOD LEFT WRIST  Result Value Ref Range Status   Specimen Description BLOOD LEFT WRIST  Final   Special Requests   Final    BOTTLES DRAWN AEROBIC ONLY Blood Culture adequate volume   Culture   Final     NO GROWTH 2 DAYS Performed at [facility], [place] [address] [place], [place] [address]    Report Status PENDING  Incomplete  Culture, blood (routine x 2)     Status: None (Preliminary result)   Collection Time: 12/31/19  2:35 AM   Specimen: BLOOD  Result Value Ref Range Status   Specimen Description BLOOD RIGHT ANTECUBITAL  Final   Special Requests   Final    BOTTLES DRAWN AEROBIC ONLY Blood Culture adequate volume   Culture  Setup Time   Final    AEROBIC BOTTLE ONLY GRAM POSITIVE COCCI CRITICAL RESULT CALLED TO, READ BACK BY AND VERIFIED WITH: KAREN AMEND [place] @ 1937 ON 01/01/20 BY ROBINSON Z.     Culture   Final    CULTURE REINCUBATED FOR BETTER GROWTH Performed at [facility], [place] [address], [place], [place] [address]    Report Status PENDING  Incomplete         Radiology Studies: No results found.      Scheduled Meds: . alfuzosin  10 mg Oral Daily  . aspirin EC  81 mg Oral Daily  . atorvastatin  20 mg Oral q1800  . dexamethasone (DECADRON) injection  6 mg Intravenous Q24H  . docusate sodium  100 mg Oral BID  . donepezil  10 mg Oral QHS  .  enoxaparin (LOVENOX) injection  40 mg Subcutaneous Q24H  . finasteride  5 mg Oral Daily  . hydrALAZINE  25 mg Oral Q8H  . Ipratropium-Albuterol  1 puff Inhalation Q6H  . levothyroxine  75 mcg Oral Daily  . memantine  5 mg Oral BID  . pantoprazole  40 mg Oral Daily  . propranolol ER  60 mg Oral Daily  . QUEtiapine  100 mg Oral QHS  . venlafaxine XR  75 mg Oral Q breakfast   Continuous Infusions: . remdesivir 100 mg in NS 100 mL 100 mg (01/01/20 1023)     LOS: 3 days   The patient is critically ill with multiple organ systems failure and requires high complexity decision making for assessment and support, frequent evaluation and titration of therapies, application of advanced monitoring technologies and extensive interpretation of multiple databases. Critical Care Time devoted to patient care services  described in this note  Time spent: 40 minutes    Lilygrace Rodick, Geraldo Docker, MD [facility] Pager [phone]  If 7PM-7AM, please contact night-coverage [email or web address] Password [facility] 01/02/2020, 7:37 AM

## 2020-01-03 LAB — CULTURE, BLOOD (ROUTINE X 2)
Special Requests: ADEQUATE
Special Requests: ADEQUATE
Special Requests: ADEQUATE

## 2020-01-03 LAB — FERRITIN: Ferritin: 32 ng/mL (ref 24–336)

## 2020-01-03 LAB — COMPREHENSIVE METABOLIC PANEL
ALT: 18 U/L (ref 0–44)
AST: 16 U/L (ref 15–41)
Albumin: 2.5 g/dL — ABNORMAL LOW (ref 3.5–5.0)
Alkaline Phosphatase: 37 U/L — ABNORMAL LOW (ref 38–126)
Anion gap: 7 (ref 5–15)
BUN: 34 mg/dL — ABNORMAL HIGH (ref 8–23)
CO2: 37 mmol/L — ABNORMAL HIGH (ref 22–32)
Calcium: 8.2 mg/dL — ABNORMAL LOW (ref 8.9–10.3)
Chloride: 92 mmol/L — ABNORMAL LOW (ref 98–111)
Creatinine, Ser: 1.12 mg/dL (ref 0.61–1.24)
GFR calc Af Amer: >60 mL/min (ref >60)
GFR calc non Af Amer: 59 mL/min — ABNORMAL LOW (ref >60)
Glucose, Bld: 112 mg/dL — ABNORMAL HIGH (ref 70–99)
Potassium: 4.3 mmol/L (ref 3.5–5.1)
Sodium: 136 mmol/L (ref 135–145)
Total Bilirubin: 0.5 mg/dL (ref 0.3–1.2)
Total Protein: 5.8 g/dL — ABNORMAL LOW (ref 6.5–8.1)

## 2020-01-03 LAB — CBC WITH DIFFERENTIAL/PLATELET
Abs Immature Granulocytes: 0.04 10*3/uL (ref 0.00–0.07)
Basophils Absolute: 0 10*3/uL (ref 0.0–0.1)
Basophils Relative: 0 %
Eosinophils Absolute: 0 10*3/uL (ref 0.0–0.5)
Eosinophils Relative: 0 %
HCT: 36.2 % — ABNORMAL LOW (ref 39.0–52.0)
Hemoglobin: 11.2 g/dL — ABNORMAL LOW (ref 13.0–17.0)
Immature Granulocytes: 1 %
Lymphocytes Relative: 21 %
Lymphs Abs: 1.7 10*3/uL (ref 0.7–4.0)
MCH: 28.9 pg (ref 26.0–34.0)
MCHC: 30.9 g/dL (ref 30.0–36.0)
MCV: 93.5 fL (ref 80.0–100.0)
Monocytes Absolute: 0.5 10*3/uL (ref 0.1–1.0)
Monocytes Relative: 6 %
Neutro Abs: 5.7 10*3/uL (ref 1.7–7.7)
Neutrophils Relative %: 72 %
Platelets: 399 10*3/uL (ref 150–400)
RBC: 3.87 MIL/uL — ABNORMAL LOW (ref 4.22–5.81)
RDW: 13.2 % (ref 11.5–15.5)
WBC: 7.9 10*3/uL (ref 4.0–10.5)
nRBC: 0 % (ref 0.0–0.2)

## 2020-01-03 LAB — D-DIMER, QUANTITATIVE: D-Dimer, Quant: 1.59 ug/mL-FEU — ABNORMAL HIGH (ref 0.00–0.50)

## 2020-01-03 LAB — PHOSPHORUS: Phosphorus: 4.4 mg/dL (ref 2.5–4.6)

## 2020-01-03 LAB — C-REACTIVE PROTEIN: CRP: 0.6 mg/dL (ref <1.0)

## 2020-01-03 LAB — MAGNESIUM: Magnesium: 1.9 mg/dL (ref 1.7–2.4)

## 2020-01-03 NOTE — Progress Notes (Signed)
PROGRESS NOTE    Douglas Grant  DGU:[id number] DOB: 1930-10-18 DOA: 12/29/2019 PCP: Shelda Pal, DO   Brief Narrative:  Douglas Grant  86 year old WM PMHx dementia, CVA, HTN, chronic diastolic CHF, HLD, diabetes type 2 controlled without complication, previous tobacco abuse, thyroidism  Presents with concern for shortness of breathfrom PCPs office where he was found to have hypoxia to the mid 80s on room air.  Son-in-law is at bedside andassisting with history.Reports he has had increasing shortness of breath over the last 3 weeks.Reports it has progressively been getting worse. Son-in-law reports that his lower extremity swelling has also increased. Reports that the patient had said that he could not sleep at night due to shortness of breath when he was laying down. The patient does report that he feels somewhat better when he sits up compared to when he lays down. Has no prior history of CHF. No history of COPD or asthma although he was a smoker for many years. Has never been on inhalers. Denies fevers, sick contacts. Reports some mild cough, but largestconcern of shortness of breath. No n/v/fevers/diarrhea/body aches. He has chronic nasal congestion which is unchanged.    Subjective: 1/10 afebrile last 24 hours A/O x1 (does not know where, when, why).  Pleasantly confused.   Assessment & Plan:   Active Problems:   Hypothyroidism   Essential hypertension   Stroke ([place])   CHF (congestive heart failure) (HCC)   Acute respiratory failure with hypoxia ([place])   COVID-19 virus infection   Pneumonia due to COVID-19 virus   Diabetes mellitus type 2, controlled, without complications (HCC)   Chronic diastolic CHF (congestive heart failure) (HCC)   HLD (hyperlipidemia)   Pressure injury of skin   Covid pneumonia/acute respiratory failure with hypoxia COVID-19 Labs  Recent Labs    01/01/20 0126 01/02/20 0358 01/03/20 0416  DDIMER 1.89* 1.95* 1.59*    FERRITIN 38 35 32  CRP 1.3* 1.0* 0.6    Lab Results  Component Value Date   SARSCOV2NAA POSITIVE (A) 12/30/2019   SARSCOV2NAA POSITIVE (A) 12/29/2019   -Family upset they had instructed ED not to transport patient to [facility] until family member had a ride because they did not believe patient positive for Covid pneumonia.. -Family member informed that a false positive extremely unlikely and that a repeat Covid test is not standard of care.  However in order to appease family at their request a second Covid test ordered.   -Decadron 6 mg daily -Remdesivir per pharmacy protocol -Currently patient does not meet criteria for Actemra or Covid convalescent plasma -1/10 ambulatory SPO2 pending   Chronic diastolic CHF -Strict in and out -2.0 L -Daily weight Filed Weights   12/31/19 0500 01/01/20 0500 01/02/20 0500  Weight: 90 kg 85 kg 85.5 kg  -1/9 increase Hydralazine po 50 mg QID -Propranolol ER 60 mg daily  Essential HTN -See CHF  Dementia/Hx stroke -Unknown baseline  Diabetes type 2 controlled without complication -1/7 hemoglobin A1c= 7.0 -Sensitive SSI  HLD -1/7 LDL= 97 -1/7 Lipitor 20 mg daily  Hypothyroidism -Synthroid 75 mcg daily  Stage I buttocks ulcer bilateral Pressure Injury 12/30/19 Buttocks Bilateral Stage 1 -  Intact skin with non-blanchable redness of a localized area usually over a bony prominence. (Active)  12/30/19 1230  Location: Buttocks  Location Orientation: Bilateral  Staging: Stage 1 -  Intact skin with non-blanchable redness of a localized area usually over a bony prominence.  Wound Description (Comments):   Present on Admission: Yes  Goals of care -1/8 PT/OT consult; evaluate for SNF  -1/9 consult LCSW; patient cannot return home as all 3 of his children work in the healthcare field and would need to be quarantined themselves.  Obtain SNF  placement    DVT prophylaxis: Lovenox Code Status: Full Family Communication: 01/01/2020 spoke  with Barbaraann Rondo (son) [phone].  Counseled on plan of care answered all questions Disposition Plan: TBD   Consultants:    Procedures/Significant Events:     I have personally reviewed and interpreted all radiology studies and my findings are as above.  VENTILATOR SETTINGS: Nasal cannula 1/10 Flow; 1 L/min SPO2 95%   Cultures   Antimicrobials:  12/30/19 1600  remdesivir 100 mg in sodium chloride 0.9 % 100 mL IVPB     100 mg 200 mL/hr over 30 Minutes 01/03/20 0959   12/30/19 1230  remdesivir 200 mg in sodium chloride 0.9% 250 mL IVPB  Status:  Discontinued     200 mg 580 mL/hr over 30 Minutes 12/30/19 1234   12/30/19 0000  remdesivir 100 mg in sodium chloride 0.9 % 100 mL IVPB     100 mg 200 mL/hr over 30 Minutes 12/30/19 0848       Devices    LINES / TUBES:      Continuous Infusions:    Objective: Vitals:   01/03/20 0400 01/03/20 0421 01/03/20 0722 01/03/20 0820  BP:  102/79 (!) 135/46   Pulse:  68 62   Resp:  15 16   Temp:  97.9 F (36.6 C) 97.8 F (36.6 C)   TempSrc:  Oral Oral   SpO2: 93% 92% 94% 92%  Weight:      Height:        Intake/Output Summary (Last 24 hours) at 01/03/2020 1231 Last data filed at 01/03/2020 0600 Gross per 24 hour  Intake 420 ml  Output 700 ml  Net -280 ml   Filed Weights   12/31/19 0500 01/01/20 0500 01/02/20 0500  Weight: 90 kg 85 kg 85.5 kg   Physical Exam:  General: A/O x1 (does not know where, when, why).  Pleasantly confused.  Positive acute respiratory distress Eyes: negative scleral hemorrhage, negative anisocoria, negative icterus ENT: Negative Runny nose, negative gingival bleeding, Neck:  Negative scars, masses, torticollis, lymphadenopathy, JVD Lungs: Clear to auscultation bilaterally without wheezes or crackles Cardiovascular: Regular rate and rhythm without murmur gallop or rub normal S1 and S2 Abdomen: negative abdominal pain, nondistended, positive soft, bowel sounds, no rebound, no ascites, no  appreciable mass Extremities: No significant cyanosis, clubbing, or edema bilateral lower extremities Skin: Negative rashes, lesions, ulcers Psychiatric:  Negative depression, negative anxiety, negative fatigue, negative mania  Central nervous system:  Cranial nerves II through XII intact, tongue/uvula midline, all extremities muscle strength 5/5, sensation intact throughout, negative dysarthria, negative expressive aphasia, negative receptive aphasia.   .     Data Reviewed: Care during the described time interval was provided by me .  I have reviewed this patient's available data, including medical history, events of note, physical examination, and all test results as part of my evaluation.   CBC: Recent Labs  Lab 12/30/19 1513 12/31/19 0100 01/01/20 0126 01/02/20 0358 01/03/20 0416  WBC 8.9 9.9 9.7 9.8 7.9  NEUTROABS 6.8 6.6 7.4 7.3 5.7  HGB 13.3 12.5* 11.6* 12.2* 11.2*  HCT 42.7 39.6 38.0* 39.1 36.2*  MCV 93.4 94.7 94.8 95.6 93.5  PLT 477* 433* 437* 457* 620   Basic Metabolic Panel: Recent Labs  Lab 12/30/19 1513  12/31/19 0100 01/01/20 0126 01/02/20 0358 01/03/20 0416  NA 141 138 137 137 136  K 4.3 4.1 4.1 4.6 4.3  CL 91* 93* 92* 91* 92*  CO2 37* 36* 35* 37* 37*  GLUCOSE 143* 111* 107* 113* 112*  BUN 23 25* 33* 31* 34*  CREATININE 1.13 1.18 1.18 1.07 1.12  CALCIUM 8.4* 8.0* 8.3* 8.6* 8.2*  MG 1.7 1.8 1.8 1.9 1.9  PHOS 3.7 4.3 4.1 3.8 4.4   GFR: Estimated Creatinine Clearance: 50.3 mL/min (by C-G formula based on SCr of 1.12 mg/dL). Liver Function Tests: Recent Labs  Lab 12/30/19 1513 12/31/19 0100 01/01/20 0126 01/02/20 0358 01/03/20 0416  AST 18 17 18 20 16   ALT 20 18 16 18 18   ALKPHOS 53 45 42 39 37*  BILITOT 0.6 0.5 0.2* 0.6 0.5  PROT 6.9 6.3* 6.1* 6.3* 5.8*  ALBUMIN 2.7* 2.5* 2.5* 2.7* 2.5*   No results for input(s): LIPASE, AMYLASE in the last 168 hours. No results for input(s): AMMONIA in the last 168 hours. Coagulation Profile: No results  for input(s): INR, PROTIME in the last 168 hours. Cardiac Enzymes: No results for input(s): CKTOTAL, CKMB, CKMBINDEX, TROPONINI in the last 168 hours. BNP (last 3 results) No results for input(s): PROBNP in the last 8760 hours. HbA1C: No results for input(s): HGBA1C in the last 72 hours. CBG: No results for input(s): GLUCAP in the last 168 hours. Lipid Profile: No results for input(s): CHOL, HDL, LDLCALC, TRIG, CHOLHDL, LDLDIRECT in the last 72 hours. Thyroid Function Tests: No results for input(s): TSH, T4TOTAL, FREET4, T3FREE, THYROIDAB in the last 72 hours. Anemia Panel: Recent Labs    01/02/20 0358 01/03/20 0416  FERRITIN 35 32   Urine analysis:    Component Value Date/Time   COLORURINE YELLOW 08/31/2018 1342   APPEARANCEUR CLEAR 08/31/2018 1342   LABSPEC 1.025 08/31/2018 1342   PHURINE 5.5 08/31/2018 1342   GLUCOSEU NEGATIVE 08/31/2018 1342   HGBUR SMALL (A) 08/31/2018 1342   HGBUR negative 07/13/2008 1535   BILIRUBINUR negative 07/29/2019 1022   KETONESUR NEGATIVE 08/31/2018 1342   PROTEINUR Positive (A) 07/29/2019 1022   PROTEINUR 30 (A) 08/31/2018 1342   UROBILINOGEN 0.2 07/29/2019 1022   UROBILINOGEN 1.0 04/21/2011 0643   NITRITE negative 07/29/2019 1022   NITRITE NEGATIVE 08/31/2018 1342   LEUKOCYTESUR Negative 07/29/2019 1022   Sepsis Labs: @LABRCNTIP (procalcitonin:4,lacticidven:4)  ) Recent Results (from the past 240 hour(s))  SARS Coronavirus 2 Ag (30 min TAT) - Nasal Swab (BD Veritor Kit)     Status: None   Collection Time: 12/29/19  4:13 PM   Specimen: Nasal Swab (BD Veritor Kit)  Result Value Ref Range Status   SARS Coronavirus 2 Ag NEGATIVE NEGATIVE Final    Comment: (NOTE) SARS-CoV-2 antigen NOT DETECTED.  Negative results are presumptive.  Negative results do not preclude SARS-CoV-2 infection and should not be used as the sole basis for treatment or other patient management decisions, including infection  control decisions, particularly in the  presence of clinical signs and  symptoms consistent with COVID-19, or in those who have been in contact with the virus.  Negative results must be combined with clinical observations, patient history, and epidemiological information. The expected result is Negative. Fact Sheet for Patients: PodPark.tn Fact Sheet for Healthcare Providers: GiftContent.is This test is not yet approved or cleared by the [place] FDA and  has been authorized for detection and/or diagnosis of SARS-CoV-2 by FDA under an Emergency Use Authorization (EUA).  This EUA will remain  in effect (meaning this test can be used) for the duration of  the COVID-19 de claration under Section 564(b)(1) of the Act, 21 U.S.C. section 360bbb-3(b)(1), unless the authorization is terminated or revoked sooner. Performed at [facility], [address] [place], [place] [address]   SARS Coronavirus 2 by RT PCR (hospital order, performed in [facility] hospital lab) Nasopharyngeal Nasopharyngeal Swab     Status: Abnormal   Collection Time: 12/29/19  8:30 PM   Specimen: Nasopharyngeal Swab  Result Value Ref Range Status   SARS Coronavirus 2 POSITIVE (A) NEGATIVE Final    Comment: RESULT CALLED TO, READ BACK BY AND VERIFIED WITH: KELLIE NEAL RN AT 2311 ON 12/29/19 BY I.SUGUT (NOTE) SARS-CoV-2 target nucleic acids are DETECTED SARS-CoV-2 RNA is generally detectable in upper respiratory specimens  during the acute phase of infection.  Positive results are indicative  of the presence of the identified virus, but do not rule out bacterial infection or co-infection with other pathogens not detected by the test.  Clinical correlation with patient history and  other diagnostic information is necessary to determine patient infection status.  The expected result is negative. Fact Sheet for Patients:   StrictlyIdeas.no  Fact Sheet for Healthcare  Providers:   BankingDealers.co.za   This test is not yet approved or cleared by the [place] FDA and  has been authorized for detection and/or diagnosis of SARS-CoV-2 by FDA under an Emergency Use Authorization (EUA).  This EUA will remain in effect (meaning this t est can be used) for the duration of  the COVID-19 declaration under Section 564(b)(1) of the Act, 21 U.S.C. section 360-bbb-3(b)(1), unless the authorization is terminated or revoked sooner. Performed at [facility], [address] [place], [place] [address]   Blood Culture (routine x 2)     Status: None (Preliminary result)   Collection Time: 12/29/19 11:59 PM   Specimen: BLOOD  Result Value Ref Range Status   Specimen Description   Final    BLOOD LEFT ANTECUBITAL Performed at [facility], [address] [place], [place] [address]    Special Requests   Final    BOTTLES DRAWN AEROBIC AND ANAEROBIC Blood Culture adequate volume Performed at [facility], [address] [place], [place] [address]    Culture  Setup Time GRAM POSITIVE COCCI AEROBIC BOTTLE ONLY   Final   Culture   Final    GRAM POSITIVE COCCI CULTURE REINCUBATED FOR BETTER GROWTH Performed at [facility], [place] [address] [place], [place] [address]    Report Status PENDING  Incomplete  Blood Culture ID Panel (Reflexed)     Status: Abnormal   Collection Time: 12/29/19 11:59 PM  Result Value Ref Range Status   Enterococcus species NOT DETECTED NOT DETECTED Final   Listeria monocytogenes NOT DETECTED NOT DETECTED Final   Staphylococcus species DETECTED (A) NOT DETECTED Final    Comment: Methicillin (oxacillin) susceptible coagulase negative staphylococcus. Possible blood culture contaminant (unless isolated from more than one blood culture draw or clinical case suggests pathogenicity). No antibiotic treatment is indicated for blood  culture contaminants. CRITICAL VALUE NOTED.  VALUE IS  CONSISTENT WITH PREVIOUSLY REPORTED AND CALLED VALUE. SEE [id number]    Staphylococcus aureus (BCID) NOT DETECTED NOT DETECTED Final   Methicillin resistance NOT DETECTED NOT DETECTED Final   Streptococcus species NOT DETECTED NOT DETECTED Final   Streptococcus agalactiae NOT DETECTED NOT DETECTED Final   Streptococcus  pneumoniae NOT DETECTED NOT DETECTED Final   Streptococcus pyogenes NOT DETECTED NOT DETECTED Final   Acinetobacter baumannii NOT DETECTED NOT DETECTED Final   Enterobacteriaceae species NOT DETECTED NOT DETECTED Final   Enterobacter cloacae complex NOT DETECTED NOT DETECTED Final   Escherichia coli NOT DETECTED NOT DETECTED Final   Klebsiella oxytoca NOT DETECTED NOT DETECTED Final   Klebsiella pneumoniae NOT DETECTED NOT DETECTED Final   Proteus species NOT DETECTED NOT DETECTED Final   Serratia marcescens NOT DETECTED NOT DETECTED Final   Haemophilus influenzae NOT DETECTED NOT DETECTED Final   Neisseria meningitidis NOT DETECTED NOT DETECTED Final   Pseudomonas aeruginosa NOT DETECTED NOT DETECTED Final   Candida albicans NOT DETECTED NOT DETECTED Final   Candida glabrata NOT DETECTED NOT DETECTED Final   Candida krusei NOT DETECTED NOT DETECTED Final   Candida parapsilosis NOT DETECTED NOT DETECTED Final   Candida tropicalis NOT DETECTED NOT DETECTED Final    Comment: Performed at [facility], [place] [address] [place], [place] [address]  Blood Culture (routine x 2)     Status: Abnormal (Preliminary result)   Collection Time: 12/30/19 12:05 AM   Specimen: BLOOD LEFT WRIST  Result Value Ref Range Status   Specimen Description   Final    BLOOD LEFT WRIST Performed at [facility], [address] [place], [place] [address]    Special Requests   Final    BOTTLES DRAWN AEROBIC AND ANAEROBIC Blood Culture adequate volume Performed at [facility], [address] [place], [place] [address]    Culture  Setup Time   Final    GRAM POSITIVE  COCCI IN CLUSTERS IN BOTH AEROBIC AND ANAEROBIC BOTTLES CRITICAL RESULT CALLED TO, READ BACK BY AND VERIFIED WITH: K AMEND PHARMD 0712 12/30/19 A BROWNING    Culture (A)  Final    STAPHYLOCOCCUS SPECIES (COAGULASE NEGATIVE) THE SIGNIFICANCE OF ISOLATING THIS ORGANISM FROM A SINGLE SET OF BLOOD CULTURES WHEN MULTIPLE SETS ARE DRAWN IS UNCERTAIN. PLEASE NOTIFY THE MICROBIOLOGY DEPARTMENT WITHIN ONE WEEK IF SPECIATION AND SENSITIVITIES ARE REQUIRED. Performed at [facility], [place] [address], [place], [place] [address]    Report Status PENDING  Incomplete  Respiratory Panel by PCR     Status: None   Collection Time: 12/30/19 12:22 PM   Specimen: Nasopharyngeal Swab; Respiratory  Result Value Ref Range Status   Adenovirus NOT DETECTED NOT DETECTED Final   Coronavirus 229E NOT DETECTED NOT DETECTED Final    Comment: (NOTE) The Coronavirus on the Respiratory Panel, DOES NOT test for the novel  Coronavirus (2019 nCoV)    Coronavirus HKU1 NOT DETECTED NOT DETECTED Final   Coronavirus NL63 NOT DETECTED NOT DETECTED Final   Coronavirus OC43 NOT DETECTED NOT DETECTED Final   Metapneumovirus NOT DETECTED NOT DETECTED Final   Rhinovirus / Enterovirus NOT DETECTED NOT DETECTED Final   Influenza A NOT DETECTED NOT DETECTED Final   Influenza B NOT DETECTED NOT DETECTED Final   Parainfluenza Virus 1 NOT DETECTED NOT DETECTED Final   Parainfluenza Virus 2 NOT DETECTED NOT DETECTED Final   Parainfluenza Virus 3 NOT DETECTED NOT DETECTED Final   Parainfluenza Virus 4 NOT DETECTED NOT DETECTED Final   Respiratory Syncytial Virus NOT DETECTED NOT DETECTED Final   Bordetella pertussis NOT DETECTED NOT DETECTED Final   Chlamydophila pneumoniae NOT DETECTED NOT DETECTED Final   Mycoplasma pneumoniae NOT DETECTED NOT DETECTED Final    Comment: Performed at [facility], [address], [place], [place] [address]  SARS CORONAVIRUS 2 (TAT 6-24 HRS) Nasopharyngeal Nasopharyngeal Swab     Status:  Abnormal   Collection Time: 12/30/19  2:49 PM   Specimen: Nasopharyngeal Swab  Result Value Ref Range Status   SARS Coronavirus 2 POSITIVE (A) NEGATIVE Final    Comment: RESULT CALLED TO, READ BACK BY AND VERIFIED WITH: M.KENNEY,RN 0017 12/31/19 G.MCADOO (NOTE) SARS-CoV-2 target nucleic acids are DETECTED. The SARS-CoV-2 RNA is generally detectable in upper and lower respiratory specimens during the acute phase of infection. Positive results are indicative of the presence of SARS-CoV-2 RNA. Clinical correlation with patient history and other diagnostic information is  necessary to determine patient infection status. Positive results do not rule out bacterial infection or co-infection with other viruses.  The expected result is Negative. Fact Sheet for Patients: SugarRoll.be Fact Sheet for Healthcare Providers: https://www.-mathews.com/ This test is not yet approved or cleared by the [place] FDA and  has been authorized for detection and/or diagnosis of SARS-CoV-2 by FDA under an Emergency Use Authorization (EUA). This EUA will remain  in effect (meaning this test can be used) for the du ration of the COVID-19 declaration under Section 564(b)(1) of the Act, 21 U.S.C. section 360bbb-3(b)(1), unless the authorization is terminated or revoked sooner. Performed at [facility], [place] [address] [place], [place] [address]   Culture, blood (routine x 2)     Status: None (Preliminary result)   Collection Time: 12/31/19  2:30 AM   Specimen: BLOOD LEFT WRIST  Result Value Ref Range Status   Specimen Description BLOOD LEFT WRIST  Final   Special Requests   Final    BOTTLES DRAWN AEROBIC ONLY Blood Culture adequate volume   Culture   Final    NO GROWTH 3 DAYS Performed at [facility], [place] [address] [place], [place] [address]    Report Status PENDING  Incomplete  Culture, blood (routine x 2)     Status: Abnormal (Preliminary  result)   Collection Time: 12/31/19  2:35 AM   Specimen: BLOOD  Result Value Ref Range Status   Specimen Description BLOOD RIGHT ANTECUBITAL  Final   Special Requests   Final    BOTTLES DRAWN AEROBIC ONLY Blood Culture adequate volume   Culture  Setup Time   Final    AEROBIC BOTTLE ONLY GRAM POSITIVE COCCI CRITICAL RESULT CALLED TO, READ BACK BY AND VERIFIED WITH: KAREN AMEND [place] @ 1829 ON 01/01/20 BY ROBINSON Z.  Performed at [facility], [place] [address] [place], [place] [address]    Culture STAPHYLOCOCCUS SPECIES (COAGULASE NEGATIVE) (A)  Final   Report Status PENDING  Incomplete         Radiology Studies: No results found.      Scheduled Meds: . alfuzosin  10 mg Oral Daily  . aspirin EC  81 mg Oral Daily  . atorvastatin  20 mg Oral q1800  . dexamethasone (DECADRON) injection  6 mg Intravenous Q24H  . docusate sodium  100 mg Oral BID  . donepezil  10 mg Oral QHS  . enoxaparin (LOVENOX) injection  40 mg Subcutaneous Q24H  . finasteride  5 mg Oral Daily  . hydrALAZINE  50 mg Oral Q6H  . Ipratropium-Albuterol  1 puff Inhalation Q6H  . levothyroxine  75 mcg Oral Daily  . memantine  5 mg Oral BID  . pantoprazole  40 mg Oral Daily  . propranolol ER  60 mg Oral Daily  .  QUEtiapine  100 mg Oral QHS  . venlafaxine XR  75 mg Oral Q breakfast   Continuous Infusions:    LOS: 4 days   The patient is critically ill with multiple organ systems failure and requires high complexity decision making for assessment and support, frequent evaluation and titration of therapies, application of advanced monitoring technologies and extensive interpretation of multiple databases. Critical Care Time devoted to patient care services described in this note  Time spent: 40 minutes    Job Holtsclaw, Geraldo Docker, MD [facility] Pager [phone]  If 7PM-7AM, please contact night-coverage [email or web address] Password [facility] 01/03/2020, 12:31 PM

## 2020-01-03 NOTE — Progress Notes (Signed)
Walking O2 Evaluation   Sitting: 93-96% on Room air  Standing: 92-95% on Room air  Walking: 92-94% on Room air walking 20 feet (lower extremity weakness limiting distance)  Patient placed back in chair after walking and reported no feelings of shortness of breath. Call light returned to patient, posey alarm on, video monitor in place. WCTM

## 2020-01-03 NOTE — Progress Notes (Signed)
pts daughter [name] updated via phone. All questions answered.

## 2020-01-03 NOTE — Plan of Care (Signed)

## 2020-01-03 NOTE — TOC Initial Note (Signed)
Transition of Care [facility]) - Initial/Assessment Note    Patient Details  Name: Douglas Grant MRN: [id number] Date of Birth: 1930-10-03  Transition of Care [facility]) CM/SW Contact:    Atilano Median, LCSW Phone Number: 01/03/2020, 1:30 PM  Clinical Narrative:                 Admitted with a PMHx dementia, CVA, HTN, chronic diastolic CHF, HLD, diabetes type 2 controlled without complication, previous tobacco abuse, thyroidism.   CSW spoke with patient's daughter [name] to discuss PT recommendations of SNF. [name] is agreeable to this plan.   CSW given permission to fax patient's information out to SNF.   Dispo is SNF pending bed offers and clinical stability.   Expected Discharge Plan: Skilled Nursing Facility Barriers to Discharge: Continued Medical Work up   Patient Goals and CMS Choice Patient states their goals for this hospitalization and ongoing recovery are:: baseline confusion CMS Medicare.gov Compare Post Acute Care list provided to:: Patient Represenative (must comment)(Barbara-dtr) Choice offered to / list presented to : Adult Children  Expected Discharge Plan and Services Expected Discharge Plan: [place] In-house Referral: Clinical Social Work   Post Acute Care Choice: [place] Living arrangements for the past 2 months: [place]                                      Prior Living Arrangements/Services Living arrangements for the past 2 months: Single Family Home Lives with:: Adult Children   Do you feel safe going back to the place where you live?: No   COVID +  Need for Family Participation in Patient Care: Yes (Comment) Care giver support system in place?: Yes (comment)      Activities of Daily Living Home Assistive Devices/Equipment: Walker (specify type) ADL Screening (condition at time of admission) Patient's cognitive ability adequate to safely complete daily activities?: No Is the patient deaf or have  difficulty hearing?: No Does the patient have difficulty seeing, even when wearing glasses/contacts?: No Does the patient have difficulty concentrating, remembering, or making decisions?: Yes Patient able to express need for assistance with ADLs?: Yes Does the patient have difficulty dressing or bathing?: Yes Independently performs ADLs?: No Communication: Independent Dressing (OT): Needs assistance Is this a change from baseline?: Pre-admission baseline Grooming: Needs assistance Is this a change from baseline?: Pre-admission baseline Feeding: Independent Bathing: Needs assistance Is this a change from baseline?: Pre-admission baseline Toileting: Independent In/Out Bed: Needs assistance Is this a change from baseline?: Change from baseline, expected to last <3 days Walks in Home: Independent Does the patient have difficulty walking or climbing stairs?: Yes Weakness of Legs: None Weakness of Arms/Hands: None  Permission Sought/Granted                  Emotional Assessment              Admission diagnosis:  CHF (congestive heart failure) ([place]) [I50.9] Acute respiratory failure with hypoxia ([place]) [J96.01] COVID-19 virus infection [U07.1] COVID-19 [U07.1] Pneumonia due to COVID-19 virus [U07.1, J12.82] Patient Active Problem List   Diagnosis Date Noted  . COVID-19 virus infection 12/30/2019  . Pneumonia due to COVID-19 virus 12/30/2019  . Diabetes mellitus type 2, controlled, without complications ([place]) 123456  . Chronic diastolic CHF (congestive heart failure) ([place]) 12/30/2019  . HLD (hyperlipidemia) 12/30/2019  . Pressure injury of skin 12/30/2019  . CHF (  congestive heart failure) ([place]) 12/29/2019  . Acute respiratory failure with hypoxia ([place]) 12/29/2019  . Dementia without behavioral disturbance ([place]) 07/29/2019  . Seasonal allergies 05/01/2018  . Intention tremor 03/20/2018  . Hallucination 03/20/2018  . Rash and nonspecific skin eruption 09/16/2016  .  Onychomycosis 10/06/2015  . Pain in lower limb 10/06/2015  . Wart 01/18/2014  . Wheezing 12/22/2013  . Obesity (BMI 30-39.9) 11/16/2013  . Paraspinal muscle spasm 08/06/2013  . Stroke ([place]) 04/24/2011  . B12 deficiency 04/24/2011  . BACK PAIN 07/28/2009  . Headache(784.0) 02/18/2009  . Candidiasis of other urogenital sites 07/13/2008  . Cervicalgia 06/15/2008  . PRURITUS 11/25/2007  . Other urinary incontinence 11/25/2007  . BENIGN PROSTATIC HYPERTROPHY, HX OF 11/25/2007  . HIP PAIN, LEFT 11/10/2007  . Hypothyroidism 08/21/2007  . OBESITY 08/21/2007  . ATHEROSCLEROSIS, CORONARY, NATIVE ARTERY 08/21/2007  . VENTRICULAR HYPERTROPHY, LEFT 08/21/2007  . GERD 08/21/2007  . ERECTILE DYSFUNCTION, ORGANIC 08/21/2007  . COLONIC POLYPS, HX OF 08/21/2007  . SPONDYLOSIS, CERVICAL, WITH RADICULOPATHY 08/11/2007  . Carpal tunnel syndrome 07/28/2007  . SPRAIN/STRAIN, SHOULDER/ARM NEC 06/26/2007  . Controlled type 2 diabetes mellitus with complication, without long-term current use of insulin ([place]) 02/10/2007  . Hyperlipidemia LDL goal <100 02/10/2007  . Essential hypertension 02/10/2007   PCP:  Shelda Pal, DO Pharmacy:   [facility] [address] [place] [address] Phone: [phone] Fax: [phone]  [facility] [place] [place] [address] Phone: [phone] Fax: [phone]  [facility] [place], [place] [address] [place] [address] Phone: [phone] Fax: [phone]     Social Determinants of Health (SDOH) Interventions    Readmission Risk Interventions No flowsheet data found.

## 2020-01-03 NOTE — NC FL2 (Signed)
[place] LEVEL OF CARE SCREENING TOOL     IDENTIFICATION  Patient Name: Douglas Grant Birthdate: 1929-12-28 Sex: male Admission Date (Current Location): 12/29/2019  [facility] and [place] Number:  Herbalist and Address:  [facility] [place], [place] [address] [place], [place] [address]      Provider Number: [id number]  Attending Physician Name and Address:  Allie Bossier, MD  Relative Name and Phone Number:  Randye Lobo [phone]    Current Level of Care: Hospital Recommended Level of Care: [place] Prior Approval Number:    Date Approved/Denied:   PASRR Number: [id number]  Discharge Plan: SNF    Current Diagnoses: Patient Active Problem List   Diagnosis Date Noted  . COVID-19 virus infection 12/30/2019  . Pneumonia due to COVID-19 virus 12/30/2019  . Diabetes mellitus type 2, controlled, without complications ([place]) 123456  . Chronic diastolic CHF (congestive heart failure) ([place]) 12/30/2019  . HLD (hyperlipidemia) 12/30/2019  . Pressure injury of skin 12/30/2019  . CHF (congestive heart failure) ([place]) 12/29/2019  . Acute respiratory failure with hypoxia ([place]) 12/29/2019  . Dementia without behavioral disturbance ([place]) 07/29/2019  . Seasonal allergies 05/01/2018  . Intention tremor 03/20/2018  . Hallucination 03/20/2018  . Rash and nonspecific skin eruption 09/16/2016  . Onychomycosis 10/06/2015  . Pain in lower limb 10/06/2015  . Wart 01/18/2014  . Wheezing 12/22/2013  . Obesity (BMI 30-39.9) 11/16/2013  . Paraspinal muscle spasm 08/06/2013  . Stroke ([place]) 04/24/2011  . B12 deficiency 04/24/2011  . BACK PAIN 07/28/2009  . Headache(784.0) 02/18/2009  . Candidiasis of other urogenital sites 07/13/2008  . Cervicalgia 06/15/2008  . PRURITUS 11/25/2007  . Other urinary incontinence 11/25/2007  . BENIGN PROSTATIC HYPERTROPHY, HX OF 11/25/2007  . HIP PAIN, LEFT 11/10/2007  . Hypothyroidism 08/21/2007   . OBESITY 08/21/2007  . ATHEROSCLEROSIS, CORONARY, NATIVE ARTERY 08/21/2007  . VENTRICULAR HYPERTROPHY, LEFT 08/21/2007  . GERD 08/21/2007  . ERECTILE DYSFUNCTION, ORGANIC 08/21/2007  . COLONIC POLYPS, HX OF 08/21/2007  . SPONDYLOSIS, CERVICAL, WITH RADICULOPATHY 08/11/2007  . Carpal tunnel syndrome 07/28/2007  . SPRAIN/STRAIN, SHOULDER/ARM NEC 06/26/2007  . Controlled type 2 diabetes mellitus with complication, without long-term current use of insulin ([place]) 02/10/2007  . Hyperlipidemia LDL goal <100 02/10/2007  . Essential hypertension 02/10/2007    Orientation RESPIRATION BLADDER Height & Weight     Self, Time, Situation  O2 Continent, External catheter Weight: 188 lb 7.9 oz (85.5 kg) Height:  5\' 9"  (175.3 cm)  BEHAVIORAL SYMPTOMS/MOOD NEUROLOGICAL BOWEL NUTRITION STATUS      Continent Diet(see discharge summary)  AMBULATORY STATUS COMMUNICATION OF NEEDS Skin   Extensive Assist Verbally Normal                       Personal Care Assistance Level of Assistance  Bathing, Feeding, Dressing Bathing Assistance: Maximum assistance Feeding assistance: Limited assistance Dressing Assistance: Maximum assistance     Functional Limitations Info             SPECIAL CARE FACTORS FREQUENCY  OT (By licensed OT), PT (By licensed PT)     PT Frequency: 5 times a week OT Frequency: 5 times a week            Contractures      Additional Factors Info  Code Status, Isolation Precautions Code Status Info: Full       Isolation Precautions Info: COVID +     Current Medications (01/03/2020):  This is the current hospital active medication list Current Facility-Administered Medications  Medication Dose Route Frequency Provider Last Rate Last Admin  . acetaminophen (TYLENOL) tablet 650 mg  650 mg Oral Q6H PRN Allie Bossier, MD      . alfuzosin (UROXATRAL) 24 hr tablet 10 mg  10 mg Oral Daily Thurnell Lose, MD   10 mg at 01/03/20 1049  . aspirin EC tablet 81 mg  81  mg Oral Daily Allie Bossier, MD   81 mg at 01/03/20 1050  . atorvastatin (LIPITOR) tablet 20 mg  20 mg Oral q1800 Allie Bossier, MD   20 mg at 01/02/20 1740  . dexamethasone (DECADRON) injection 6 mg  6 mg Intravenous Q24H Allie Bossier, MD   6 mg at 01/03/20 1049  . docusate sodium (COLACE) capsule 100 mg  100 mg Oral BID Allie Bossier, MD   100 mg at 01/03/20 1050  . donepezil (ARICEPT) tablet 10 mg  10 mg Oral QHS Thurnell Lose, MD   10 mg at 01/02/20 2103  . enoxaparin (LOVENOX) injection 40 mg  40 mg Subcutaneous Q24H Allie Bossier, MD   40 mg at 01/02/20 1517  . finasteride (PROSCAR) tablet 5 mg  5 mg Oral Daily Thurnell Lose, MD   5 mg at 01/03/20 1050  . hydrALAZINE (APRESOLINE) tablet 50 mg  50 mg Oral Q6H Allie Bossier, MD   50 mg at 01/03/20 [id number]  . Ipratropium-Albuterol (COMBIVENT) respimat 1 puff  1 puff Inhalation Q6H Allie Bossier, MD   1 puff at 01/03/20 [phone]  . levothyroxine (SYNTHROID) tablet 75 mcg  75 mcg Oral Daily Thurnell Lose, MD   75 mcg at 01/03/20 [id number]  . loperamide (IMODIUM) capsule 2 mg  2 mg Oral PRN Peyton Bottoms, MD   2 mg at 12/31/19 2027  . memantine (NAMENDA) tablet 5 mg  5 mg Oral BID Thurnell Lose, MD   5 mg at 01/03/20 1050  . pantoprazole (PROTONIX) EC tablet 40 mg  40 mg Oral Daily Thurnell Lose, MD   40 mg at 01/03/20 1050  . propranolol ER (INDERAL LA) 24 hr capsule 60 mg  60 mg Oral Daily Thurnell Lose, MD   60 mg at 01/03/20 1050  . QUEtiapine (SEROQUEL) tablet 100 mg  100 mg Oral QHS Thurnell Lose, MD   100 mg at 01/02/20 2102  . traMADol (ULTRAM) tablet 50 mg  50 mg Oral Q6H PRN Allie Bossier, MD   50 mg at 01/02/20 2103  . venlafaxine XR (EFFEXOR-XR) 24 hr capsule 75 mg  75 mg Oral Q breakfast Thurnell Lose, MD   75 mg at 01/03/20 [id number]     Discharge Medications: Please see discharge summary for a list of discharge medications.  Relevant Imaging Results:  Relevant Lab Results:   Additional  Information ss #[phone] Will require 30 days of skilled services  Atilano Median, LCSW

## 2020-01-04 LAB — CBC WITH DIFFERENTIAL/PLATELET
Abs Immature Granulocytes: 0.02 10*3/uL (ref 0.00–0.07)
Basophils Absolute: 0 10*3/uL (ref 0.0–0.1)
Basophils Relative: 0 %
Eosinophils Absolute: 0 10*3/uL (ref 0.0–0.5)
Eosinophils Relative: 0 %
HCT: 38.1 % — ABNORMAL LOW (ref 39.0–52.0)
Hemoglobin: 11.9 g/dL — ABNORMAL LOW (ref 13.0–17.0)
Immature Granulocytes: 0 %
Lymphocytes Relative: 23 %
Lymphs Abs: 2.3 10*3/uL (ref 0.7–4.0)
MCH: 28.9 pg (ref 26.0–34.0)
MCHC: 31.2 g/dL (ref 30.0–36.0)
MCV: 92.5 fL (ref 80.0–100.0)
Monocytes Absolute: 0.8 10*3/uL (ref 0.1–1.0)
Monocytes Relative: 8 %
Neutro Abs: 7 10*3/uL (ref 1.7–7.7)
Neutrophils Relative %: 69 %
Platelets: 436 10*3/uL — ABNORMAL HIGH (ref 150–400)
RBC: 4.12 MIL/uL — ABNORMAL LOW (ref 4.22–5.81)
RDW: 13.5 % (ref 11.5–15.5)
WBC: 10.1 10*3/uL (ref 4.0–10.5)
nRBC: 0 % (ref 0.0–0.2)

## 2020-01-04 LAB — COMPREHENSIVE METABOLIC PANEL
ALT: 18 U/L (ref 0–44)
AST: 14 U/L — ABNORMAL LOW (ref 15–41)
Albumin: 2.5 g/dL — ABNORMAL LOW (ref 3.5–5.0)
Alkaline Phosphatase: 32 U/L — ABNORMAL LOW (ref 38–126)
Anion gap: 9 (ref 5–15)
BUN: 43 mg/dL — ABNORMAL HIGH (ref 8–23)
CO2: 34 mmol/L — ABNORMAL HIGH (ref 22–32)
Calcium: 8.5 mg/dL — ABNORMAL LOW (ref 8.9–10.3)
Chloride: 93 mmol/L — ABNORMAL LOW (ref 98–111)
Creatinine, Ser: 1.39 mg/dL — ABNORMAL HIGH (ref 0.61–1.24)
GFR calc Af Amer: 52 mL/min — ABNORMAL LOW (ref >60)
GFR calc non Af Amer: 45 mL/min — ABNORMAL LOW (ref >60)
Glucose, Bld: 133 mg/dL — ABNORMAL HIGH (ref 70–99)
Potassium: 4.3 mmol/L (ref 3.5–5.1)
Sodium: 136 mmol/L (ref 135–145)
Total Bilirubin: 0.8 mg/dL (ref 0.3–1.2)
Total Protein: 5.8 g/dL — ABNORMAL LOW (ref 6.5–8.1)

## 2020-01-04 LAB — MAGNESIUM: Magnesium: 1.8 mg/dL (ref 1.7–2.4)

## 2020-01-04 LAB — C-REACTIVE PROTEIN: CRP: 0.6 mg/dL (ref <1.0)

## 2020-01-04 LAB — FERRITIN: Ferritin: 29 ng/mL (ref 24–336)

## 2020-01-04 LAB — D-DIMER, QUANTITATIVE: D-Dimer, Quant: 1.77 ug/mL-FEU — ABNORMAL HIGH (ref 0.00–0.50)

## 2020-01-04 LAB — PHOSPHORUS: Phosphorus: 3.9 mg/dL (ref 2.5–4.6)

## 2020-01-04 NOTE — Plan of Care (Signed)
[name]&Ox3. Forgetful but easily redirected. Also having visual hallucinations, seeing bugs towards end of shift. VSS, SpO2 >92% on RA. No c/o pain. Utilizing urinal & ambulating to bathroom independently.   Daughter updated via phone w/ plan of care  Will report to oncoming nurse.   Tomie China    Problem: Education: Goal: Knowledge of risk factors and measures for prevention of condition will improve Outcome: Progressing   Problem: Coping: Goal: Psychosocial and spiritual needs will be supported Outcome: Progressing   Problem: Respiratory: Goal: Will maintain a patent airway Outcome: Progressing Goal: Complications related to the disease process, condition or treatment will be avoided or minimized Outcome: Progressing   Problem: Education: Goal: Knowledge of General Education information will improve Description: Including pain rating scale, medication(s)/side effects and non-pharmacologic comfort measures Outcome: Progressing   Problem: Health Behavior/Discharge Planning: Goal: Ability to manage health-related needs will improve Outcome: Progressing   Problem: Clinical Measurements: Goal: Ability to maintain clinical measurements within normal limits will improve Outcome: Progressing Goal: Will remain free from infection Outcome: Progressing Goal: Diagnostic test results will improve Outcome: Progressing Goal: Respiratory complications will improve Outcome: Progressing Goal: Cardiovascular complication will be avoided Outcome: Progressing   Problem: Activity: Goal: Risk for activity intolerance will decrease Outcome: Progressing   Problem: Nutrition: Goal: Adequate nutrition will be maintained Outcome: Progressing   Problem: Coping: Goal: Level of anxiety will decrease Outcome: Progressing   Problem: Elimination: Goal: Will not experience complications related to bowel motility Outcome: Progressing Goal: Will not experience complications related to  urinary retention Outcome: Progressing   Problem: Pain Managment: Goal: General experience of comfort will improve Outcome: Progressing   Problem: Safety: Goal: Ability to remain free from injury will improve Outcome: Progressing   Problem: Skin Integrity: Goal: Risk for impaired skin integrity will decrease Outcome: Progressing

## 2020-01-04 NOTE — Evaluation (Signed)
Occupational Therapy Evaluation Patient Details Name: Douglas Grant MRN: [id number] DOB: 1930/07/22 Today's Date: 01/04/2020    History of Present Illness 86 year old WM PMHx dementia, CVA, HTN, chronic diastolic CHF, HLD, diabetes type 2 controlled without complication, previous tobacco abuse, thyroidism who presents with concern for shortness of breath from PCPs office where he was found to have hypoxia to the mid 80s on room air. Son in law has at bedside to give hx, pt progressively worse SOB and LE swelling w/ mild cough.    Clinical Impression   Pt presenting with decreased balance and activity tolerance. Eager to participate in therapy. Per RN, pt was living home alone and pt reporting he was independent PTA. Pt currently requiring Min Guard A for ADLs in standing, LB ADLs, and functional mobility with RW. Pt with baseline dementia and requiring cues throughout. SpO2 >87% on RA throughout. Pt would benefit from further acute OT to facilitate safe dc. Recommend dc to SNF for further OT to optimize safety, independence with ADLs, and return to PLOF.      Follow Up Recommendations  SNF;Supervision/Assistance - 24 hour    Equipment Recommendations  3 in 1 bedside commode(Defer to next venue)    Recommendations for Other Services PT consult     Precautions / Restrictions Precautions Precautions: Fall;Other (comment)(cognition was agitatted earlier) Restrictions Weight Bearing Restrictions: No      Mobility Bed Mobility Overal bed mobility: Needs Assistance Bed Mobility: Supine to Sit     Supine to sit: Supervision     General bed mobility comments: Supervision for safety  Transfers Overall transfer level: Needs assistance Equipment used: Rolling walker (2 wheeled) Transfers: Sit to/from Stand Sit to Stand: Min guard         General transfer comment: Min Guard A for safety    Balance Overall balance assessment: Needs assistance Sitting-balance support: Feet  supported Sitting balance-Leahy Scale: Fair       Standing balance-Leahy Scale: Fair Standing balance comment: Able to perform grooming at sink with Min Guard A for safety                           ADL either performed or assessed with clinical judgement   ADL Overall ADL's : Needs assistance/impaired Eating/Feeding: Set up;Sitting   Grooming: Min guard;Wash/dry face;Wash/dry hands;Standing Grooming Details (indicate cue type and reason): Min Guard A for safety Upper Body Bathing: Set up;Sitting;Supervision/ safety   Lower Body Bathing: Min guard;Sit to/from stand   Upper Body Dressing : Set up;Supervision/safety;Sitting   Lower Body Dressing: Min guard;Sit to/from stand Lower Body Dressing Details (indicate cue type and reason): Min Guard A for safety. Pt able to bend forward and able to adjust socks Toilet Transfer: Min guard;Ambulation;RW(simulated to recliner)           Functional mobility during ADLs: Min guard;Rolling walker General ADL Comments: Pt performing at [place] level for safety. Cues for cognitive deficits. SpO2 >87% on RA throughout     Vision         Perception     Praxis      Pertinent Vitals/Pain Pain Assessment: No/denies pain     Hand Dominance Right   Extremity/Trunk Assessment Upper Extremity Assessment Upper Extremity Assessment: Generalized weakness   Lower Extremity Assessment Lower Extremity Assessment: Generalized weakness   Cervical / Trunk Assessment Cervical / Trunk Assessment: Kyphotic   Communication Communication Communication: No difficulties   Cognition Arousal/Alertness: Awake/alert Behavior During  Therapy: WFL for tasks assessed/performed Overall Cognitive Status: History of cognitive impairments - at baseline                                 General Comments: Baseline dementia. Pt reporting "it is the new year" but unable correctly report that year. When asked about his location, pt  stating "I am somewhere where I can get better because I am sick." Pt following commands and maintaining simple conversation. ST memory deficits throughout and asking reassuring questions   General Comments  Spo2 >87% on RA    Exercises     Shoulder Instructions      Home Living Family/patient expects to be discharged to:: Skilled nursing facility Living Arrangements: Alone                               Additional Comments: Poor historian. Per RN, lives home alone.      Prior Functioning/Environment Level of Independence: Independent        Comments: Pt reporting he was independent. However, dementia at baseline and poor historian.         OT Problem List: Decreased strength;Decreased activity tolerance;Decreased range of motion;Decreased safety awareness;Decreased knowledge of use of DME or AE;Decreased knowledge of precautions      OT Treatment/Interventions: Self-care/ADL training;Therapeutic exercise;Energy conservation;DME and/or AE instruction;Therapeutic activities;Patient/family education    OT Goals(Current goals can be found in the care plan section) Acute Rehab OT Goals Patient Stated Goal: Agreeable to OOB OT Goal Formulation: With patient Time For Goal Achievement: 01/18/20 Potential to Achieve Goals: Good  OT Frequency: Min 2X/week   Barriers to D/C:            Co-evaluation              AM-PAC OT "6 Clicks" Daily Activity     Outcome Measure Help from another person eating meals?: None Help from another person taking care of personal grooming?: A Little Help from another person toileting, which includes using toliet, bedpan, or urinal?: A Little Help from another person bathing (including washing, rinsing, drying)?: A Little Help from another person to put on and taking off regular upper body clothing?: None Help from another person to put on and taking off regular lower body clothing?: A Little 6 Click Score: 20   End of  Session Equipment Utilized During Treatment: Rolling walker Nurse Communication: Mobility status  Activity Tolerance: Patient tolerated treatment well Patient left: in chair;with call bell/phone within reach;with chair alarm set;with nursing/sitter in room  OT Visit Diagnosis: Unsteadiness on feet (R26.81);Other abnormalities of gait and mobility (R26.89);Muscle weakness (generalized) (M62.81)                Time: [id number] OT Time Calculation (min): 23 min Charges:  OT General Charges $OT Visit: 1 Visit OT Evaluation $OT Eval Moderate Complexity: 1 Mod OT Treatments $Self Care/Home Management : 8-22 mins  Jeana Kersting MSOT, OTR/L Acute Rehab Pager: [phone] Office: [place] 01/04/2020, 9:13 AM

## 2020-01-04 NOTE — Progress Notes (Signed)
PROGRESS NOTE    Douglas Grant  ZOX:[id number] DOB: 11-Dec-1930 DOA: 12/29/2019 PCP: Shelda Pal, DO   Brief Narrative:  Douglas Grant  86 year old WM PMHx dementia, CVA, HTN, chronic diastolic CHF, HLD, diabetes type 2 controlled without complication, previous tobacco abuse, thyroidism  Presents with concern for shortness of breathfrom PCPs office where he was found to have hypoxia to the mid 80s on room air.  Son-in-law is at bedside andassisting with history.Reports he has had increasing shortness of breath over the last 3 weeks.Reports it has progressively been getting worse. Son-in-law reports that his lower extremity swelling has also increased. Reports that the patient had said that he could not sleep at night due to shortness of breath when he was laying down. The patient does report that he feels somewhat better when he sits up compared to when he lays down. Has no prior history of CHF. No history of COPD or asthma although he was a smoker for many years. Has never been on inhalers. Denies fevers, sick contacts. Reports some mild cough, but largestconcern of shortness of breath. No n/v/fevers/diarrhea/body aches. He has chronic nasal congestion which is unchanged.    Subjective: 1/11 afebrile overnight A/O x1 (does not know where, when, why).    Assessment & Plan:   Active Problems:   Hypothyroidism   Essential hypertension   Stroke ([place])   CHF (congestive heart failure) (HCC)   Acute respiratory failure with hypoxia ([place])   COVID-19 virus infection   Pneumonia due to COVID-19 virus   Diabetes mellitus type 2, controlled, without complications (HCC)   Chronic diastolic CHF (congestive heart failure) (HCC)   HLD (hyperlipidemia)   Pressure injury of skin   Covid pneumonia/acute respiratory failure with hypoxia COVID-19 Labs  Recent Labs    01/02/20 0358 01/03/20 0416 01/04/20 0536  DDIMER 1.95* 1.59* 1.77*  FERRITIN 35 32 29  CRP 1.0*  0.6 0.6    Lab Results  Component Value Date   SARSCOV2NAA POSITIVE (A) 12/30/2019   SARSCOV2NAA POSITIVE (A) 12/29/2019   -Family upset they had instructed ED not to transport patient to [facility] until family member had a ride because they did not believe patient positive for Covid pneumonia.. -Family member informed that a false positive extremely unlikely and that a repeat Covid test is not standard of care.  However in order to appease family at their request a second Covid test ordered.   -Decadron 6 mg daily -Remdesivir per pharmacy protocol -Currently patient does not meet criteria for Actemra or Covid convalescent plasma -1/10 ambulatory SPO2 pending  Walking O2 Evaluation   Sitting: 93-96% on Room air  Standing: 92-95% on Room air  Walking: 92-94% on Room air walking 20 feet (lower extremity weakness limiting distance)  Patient placed back in chair after walking and reported no feelings of shortness of breath. Call light returned to patient, posey alarm on, video monitor in place. [facility] -Patient does not meet criteria for home O2  Chronic diastolic CHF -Strict in and out -2.6 L -Daily weight Filed Weights   01/01/20 0500 01/02/20 0500 01/04/20 0500  Weight: 85 kg 85.5 kg 84.5 kg  -1/9 increase Hydralazine po 50 mg QID -Propranolol ER 60 mg daily  Essential HTN -See CHF  Dementia/Hx stroke -Unknown baseline  Diabetes type 2 controlled without complication -1/7 hemoglobin A1c= 7.0 -Sensitive SSI  HLD -1/7 LDL= 97 -1/7 Lipitor 20 mg daily  Hypothyroidism -Synthroid 75 mcg daily  Stage I buttocks ulcer bilateral Pressure Injury  12/30/19 Buttocks Bilateral Stage 1 -  Intact skin with non-blanchable redness of a localized area usually over a bony prominence. (Active)  12/30/19 1230  Location: Buttocks  Location Orientation: Bilateral  Staging: Stage 1 -  Intact skin with non-blanchable redness of a localized area usually over a bony prominence.   Wound Description (Comments):   Present on Admission: Yes   Goals of care -1/8 PT/OT consult; evaluate for SNF  -1/9 consult LCSW; patient cannot return home as all 3 of his children work in the healthcare field and would need to be quarantined themselves.  Obtain SNF  placement    DVT prophylaxis: Lovenox Code Status: Full Family Communication: 01/01/2020 spoke with Barbaraann Rondo (son) [phone].  Counseled on plan of care answered all questions Disposition Plan: TBD   Consultants:    Procedures/Significant Events:     I have personally reviewed and interpreted all radiology studies and my findings are as above.  VENTILATOR SETTINGS: Room air 1/11 SPO2 96%   Cultures   Antimicrobials:  12/30/19 1600  remdesivir 100 mg in sodium chloride 0.9 % 100 mL IVPB     100 mg 200 mL/hr over 30 Minutes 01/03/20 0959   12/30/19 1230  remdesivir 200 mg in sodium chloride 0.9% 250 mL IVPB  Status:  Discontinued     200 mg 580 mL/hr over 30 Minutes 12/30/19 1234   12/30/19 0000  remdesivir 100 mg in sodium chloride 0.9 % 100 mL IVPB     100 mg 200 mL/hr over 30 Minutes 12/30/19 0848       Devices    LINES / TUBES:      Continuous Infusions:    Objective: Vitals:   01/03/20 1927 01/03/20 2105 01/04/20 0430 01/04/20 0500  BP: (!) 114/53  128/80   Pulse: 68  72   Resp: 20  18   Temp: 98.1 F (36.7 C)  98.3 F (36.8 C)   TempSrc: Oral  Oral   SpO2: 91% 94% 91%   Weight:    84.5 kg  Height:        Intake/Output Summary (Last 24 hours) at 01/04/2020 0844 Last data filed at 01/04/2020 0600 Gross per 24 hour  Intake 500 ml  Output 1475 ml  Net -975 ml   Filed Weights   01/01/20 0500 01/02/20 0500 01/04/20 0500  Weight: 85 kg 85.5 kg 84.5 kg    Physical Exam:  General: A/O x1 (does not know where, when, why).  Pleasantly confused, no acute respiratory distress Eyes: negative scleral hemorrhage, negative anisocoria, negative icterus ENT: Negative Runny nose,  negative gingival bleeding, Neck:  Negative scars, masses, torticollis, lymphadenopathy, JVD Lungs: Clear to auscultation bilaterally without wheezes or crackles Cardiovascular: Regular rate and rhythm without murmur gallop or rub normal S1 and S2 Abdomen: negative abdominal pain, nondistended, positive soft, bowel sounds, no rebound, no ascites, no appreciable mass Extremities: No significant cyanosis, clubbing, or edema bilateral lower extremities Skin: Negative rashes, lesions, ulcers Psychiatric:  Negative depression, negative anxiety, negative fatigue, negative mania  Central nervous system:  Cranial nerves II through XII intact, tongue/uvula midline, all extremities muscle strength 5/5, sensation intact throughout,  negative dysarthria, negative expressive aphasia, negative receptive aphasia.   .     Data Reviewed: Care during the described time interval was provided by me .  I have reviewed this patient's available data, including medical history, events of note, physical examination, and all test results as part of my evaluation.   CBC: Recent Labs  Lab  12/31/19 0100 01/01/20 0126 01/02/20 0358 01/03/20 0416 01/04/20 0536  WBC 9.9 9.7 9.8 7.9 10.1  NEUTROABS 6.6 7.4 7.3 5.7 7.0  HGB 12.5* 11.6* 12.2* 11.2* 11.9*  HCT 39.6 38.0* 39.1 36.2* 38.1*  MCV 94.7 94.8 95.6 93.5 92.5  PLT 433* 437* 457* 399 947*   Basic Metabolic Panel: Recent Labs  Lab 12/31/19 0100 01/01/20 0126 01/02/20 0358 01/03/20 0416 01/04/20 0536  NA 138 137 137 136 136  K 4.1 4.1 4.6 4.3 4.3  CL 93* 92* 91* 92* 93*  CO2 36* 35* 37* 37* 34*  GLUCOSE 111* 107* 113* 112* 133*  BUN 25* 33* 31* 34* 43*  CREATININE 1.18 1.18 1.07 1.12 1.39*  CALCIUM 8.0* 8.3* 8.6* 8.2* 8.5*  MG 1.8 1.8 1.9 1.9 1.8  PHOS 4.3 4.1 3.8 4.4 3.9   GFR: Estimated Creatinine Clearance: 37.4 mL/min (A) (by C-G formula based on SCr of 1.39 mg/dL (H)). Liver Function Tests: Recent Labs  Lab 12/31/19 0100 01/01/20 0126  01/02/20 0358 01/03/20 0416 01/04/20 0536  AST 17 18 20 16  14*  ALT 18 16 18 18 18   ALKPHOS 45 42 39 37* 32*  BILITOT 0.5 0.2* 0.6 0.5 0.8  PROT 6.3* 6.1* 6.3* 5.8* 5.8*  ALBUMIN 2.5* 2.5* 2.7* 2.5* 2.5*   No results for input(s): LIPASE, AMYLASE in the last 168 hours. No results for input(s): AMMONIA in the last 168 hours. Coagulation Profile: No results for input(s): INR, PROTIME in the last 168 hours. Cardiac Enzymes: No results for input(s): CKTOTAL, CKMB, CKMBINDEX, TROPONINI in the last 168 hours. BNP (last 3 results) No results for input(s): PROBNP in the last 8760 hours. HbA1C: No results for input(s): HGBA1C in the last 72 hours. CBG: No results for input(s): GLUCAP in the last 168 hours. Lipid Profile: No results for input(s): CHOL, HDL, LDLCALC, TRIG, CHOLHDL, LDLDIRECT in the last 72 hours. Thyroid Function Tests: No results for input(s): TSH, T4TOTAL, FREET4, T3FREE, THYROIDAB in the last 72 hours. Anemia Panel: Recent Labs    01/03/20 0416 01/04/20 0536  FERRITIN 32 29   Urine analysis:    Component Value Date/Time   COLORURINE YELLOW 08/31/2018 1342   APPEARANCEUR CLEAR 08/31/2018 1342   LABSPEC 1.025 08/31/2018 1342   PHURINE 5.5 08/31/2018 1342   GLUCOSEU NEGATIVE 08/31/2018 1342   HGBUR SMALL (A) 08/31/2018 1342   HGBUR negative 07/13/2008 1535   BILIRUBINUR negative 07/29/2019 1022   KETONESUR NEGATIVE 08/31/2018 1342   PROTEINUR Positive (A) 07/29/2019 1022   PROTEINUR 30 (A) 08/31/2018 1342   UROBILINOGEN 0.2 07/29/2019 1022   UROBILINOGEN 1.0 04/21/2011 0643   NITRITE negative 07/29/2019 1022   NITRITE NEGATIVE 08/31/2018 1342   LEUKOCYTESUR Negative 07/29/2019 1022   Sepsis Labs: @LABRCNTIP (procalcitonin:4,lacticidven:4)  ) Recent Results (from the past 240 hour(s))  SARS Coronavirus 2 Ag (30 min TAT) - Nasal Swab (BD Veritor Kit)     Status: None   Collection Time: 12/29/19  4:13 PM   Specimen: Nasal Swab (BD Veritor Kit)  Result  Value Ref Range Status   SARS Coronavirus 2 Ag NEGATIVE NEGATIVE Final    Comment: (NOTE) SARS-CoV-2 antigen NOT DETECTED.  Negative results are presumptive.  Negative results do not preclude SARS-CoV-2 infection and should not be used as the sole basis for treatment or other patient management decisions, including infection  control decisions, particularly in the presence of clinical signs and  symptoms consistent with COVID-19, or in those who have been in contact with the virus.  Negative  results must be combined with clinical observations, patient history, and epidemiological information. The expected result is Negative. Fact Sheet for Patients: PodPark.tn Fact Sheet for Healthcare Providers: GiftContent.is This test is not yet approved or cleared by the [place] FDA and  has been authorized for detection and/or diagnosis of SARS-CoV-2 by FDA under an Emergency Use Authorization (EUA).  This EUA will remain in effect (meaning this test can be used) for the duration of  the COVID-19 de claration under Section 564(b)(1) of the Act, 21 U.S.C. section 360bbb-3(b)(1), unless the authorization is terminated or revoked sooner. Performed at [facility], [address] [place], [place] [address]   SARS Coronavirus 2 by RT PCR (hospital order, performed in [facility] hospital lab) Nasopharyngeal Nasopharyngeal Swab     Status: Abnormal   Collection Time: 12/29/19  8:30 PM   Specimen: Nasopharyngeal Swab  Result Value Ref Range Status   SARS Coronavirus 2 POSITIVE (A) NEGATIVE Final    Comment: RESULT CALLED TO, READ BACK BY AND VERIFIED WITH: KELLIE NEAL RN AT 2311 ON 12/29/19 BY I.SUGUT (NOTE) SARS-CoV-2 target nucleic acids are DETECTED SARS-CoV-2 RNA is generally detectable in upper respiratory specimens  during the acute phase of infection.  Positive results are indicative  of the presence of the identified  virus, but do not rule out bacterial infection or co-infection with other pathogens not detected by the test.  Clinical correlation with patient history and  other diagnostic information is necessary to determine patient infection status.  The expected result is negative. Fact Sheet for Patients:   StrictlyIdeas.no  Fact Sheet for Healthcare Providers:   BankingDealers.co.za   This test is not yet approved or cleared by the [place] FDA and  has been authorized for detection and/or diagnosis of SARS-CoV-2 by FDA under an Emergency Use Authorization (EUA).  This EUA will remain in effect (meaning this t est can be used) for the duration of  the COVID-19 declaration under Section 564(b)(1) of the Act, 21 U.S.C. section 360-bbb-3(b)(1), unless the authorization is terminated or revoked sooner. Performed at [facility], [address] [place], [place] [address]   Blood Culture (routine x 2)     Status: Abnormal   Collection Time: 12/29/19 11:59 PM   Specimen: BLOOD  Result Value Ref Range Status   Specimen Description   Final    BLOOD LEFT ANTECUBITAL Performed at [facility], [address] [place], [place] [address]    Special Requests   Final    BOTTLES DRAWN AEROBIC AND ANAEROBIC Blood Culture adequate volume Performed at [facility] [address] [place], [place] [address]    Culture  Setup Time GRAM POSITIVE COCCI AEROBIC BOTTLE ONLY   Final   Culture (A)  Final    STAPHYLOCOCCUS CAPITIS THE SIGNIFICANCE OF ISOLATING THIS ORGANISM FROM A SINGLE SET OF BLOOD CULTURES WHEN MULTIPLE SETS ARE DRAWN IS UNCERTAIN. PLEASE NOTIFY THE MICROBIOLOGY DEPARTMENT WITHIN ONE WEEK IF SPECIATION AND SENSITIVITIES ARE REQUIRED. Performed at [facility], [place] [address] [place], [place] [address]    Report Status 01/03/2020 FINAL  Final  Blood Culture ID Panel (Reflexed)     Status: Abnormal    Collection Time: 12/29/19 11:59 PM  Result Value Ref Range Status   Enterococcus species NOT DETECTED NOT DETECTED Final   Listeria monocytogenes NOT DETECTED NOT DETECTED Final   Staphylococcus species DETECTED (A) NOT DETECTED Final  Comment: Methicillin (oxacillin) susceptible coagulase negative staphylococcus. Possible blood culture contaminant (unless isolated from more than one blood culture draw or clinical case suggests pathogenicity). No antibiotic treatment is indicated for blood  culture contaminants. CRITICAL VALUE NOTED.  VALUE IS CONSISTENT WITH PREVIOUSLY REPORTED AND CALLED VALUE. SEE [id number]    Staphylococcus aureus (BCID) NOT DETECTED NOT DETECTED Final   Methicillin resistance NOT DETECTED NOT DETECTED Final   Streptococcus species NOT DETECTED NOT DETECTED Final   Streptococcus agalactiae NOT DETECTED NOT DETECTED Final   Streptococcus pneumoniae NOT DETECTED NOT DETECTED Final   Streptococcus pyogenes NOT DETECTED NOT DETECTED Final   Acinetobacter baumannii NOT DETECTED NOT DETECTED Final   Enterobacteriaceae species NOT DETECTED NOT DETECTED Final   Enterobacter cloacae complex NOT DETECTED NOT DETECTED Final   Escherichia coli NOT DETECTED NOT DETECTED Final   Klebsiella oxytoca NOT DETECTED NOT DETECTED Final   Klebsiella pneumoniae NOT DETECTED NOT DETECTED Final   Proteus species NOT DETECTED NOT DETECTED Final   Serratia marcescens NOT DETECTED NOT DETECTED Final   Haemophilus influenzae NOT DETECTED NOT DETECTED Final   Neisseria meningitidis NOT DETECTED NOT DETECTED Final   Pseudomonas aeruginosa NOT DETECTED NOT DETECTED Final   Candida albicans NOT DETECTED NOT DETECTED Final   Candida glabrata NOT DETECTED NOT DETECTED Final   Candida krusei NOT DETECTED NOT DETECTED Final   Candida parapsilosis NOT DETECTED NOT DETECTED Final   Candida tropicalis NOT DETECTED NOT DETECTED Final    Comment: Performed at [facility], [address],  [place], [place] [address]  Blood Culture (routine x 2)     Status: Abnormal   Collection Time: 12/30/19 12:05 AM   Specimen: BLOOD LEFT WRIST  Result Value Ref Range Status   Specimen Description   Final    BLOOD LEFT WRIST Performed at [facility], [address] [place], [place] [address]    Special Requests   Final    BOTTLES DRAWN AEROBIC AND ANAEROBIC Blood Culture adequate volume Performed at [facility], [address] [place], [place] [address]    Culture  Setup Time   Final    GRAM POSITIVE COCCI IN CLUSTERS IN BOTH AEROBIC AND ANAEROBIC BOTTLES CRITICAL RESULT CALLED TO, READ BACK BY AND VERIFIED WITH: K AMEND PHARMD [id number] 12/30/19 A BROWNING    Culture (A)  Final    STAPHYLOCOCCUS EPIDERMIDIS THE SIGNIFICANCE OF ISOLATING THIS ORGANISM FROM A SINGLE SET OF BLOOD CULTURES WHEN MULTIPLE SETS ARE DRAWN IS UNCERTAIN. PLEASE NOTIFY THE MICROBIOLOGY DEPARTMENT WITHIN ONE WEEK IF SPECIATION AND SENSITIVITIES ARE REQUIRED. Performed at [facility], [place] [address], [place], [place] [address]    Report Status 01/03/2020 FINAL  Final  Respiratory Panel by PCR     Status: None   Collection Time: 12/30/19 12:22 PM   Specimen: Nasopharyngeal Swab; Respiratory  Result Value Ref Range Status   Adenovirus NOT DETECTED NOT DETECTED Final   Coronavirus 229E NOT DETECTED NOT DETECTED Final    Comment: (NOTE) The Coronavirus on the Respiratory Panel, DOES NOT test for the novel  Coronavirus (2019 nCoV)    Coronavirus HKU1 NOT DETECTED NOT DETECTED Final   Coronavirus NL63 NOT DETECTED NOT DETECTED Final   Coronavirus OC43 NOT DETECTED NOT DETECTED Final   Metapneumovirus NOT DETECTED NOT DETECTED Final   Rhinovirus / Enterovirus NOT DETECTED NOT DETECTED Final   Influenza A NOT DETECTED NOT DETECTED Final   Influenza B NOT DETECTED NOT  DETECTED Final   Parainfluenza Virus 1 NOT DETECTED NOT DETECTED Final   Parainfluenza Virus 2 NOT DETECTED NOT DETECTED Final    Parainfluenza Virus 3 NOT DETECTED NOT DETECTED Final   Parainfluenza Virus 4 NOT DETECTED NOT DETECTED Final   Respiratory Syncytial Virus NOT DETECTED NOT DETECTED Final   Bordetella pertussis NOT DETECTED NOT DETECTED Final   Chlamydophila pneumoniae NOT DETECTED NOT DETECTED Final   Mycoplasma pneumoniae NOT DETECTED NOT DETECTED Final    Comment: Performed at [facility], [place] [address] [place], [place] [address]  SARS CORONAVIRUS 2 (TAT 6-24 HRS) Nasopharyngeal Nasopharyngeal Swab     Status: Abnormal   Collection Time: 12/30/19  2:49 PM   Specimen: Nasopharyngeal Swab  Result Value Ref Range Status   SARS Coronavirus 2 POSITIVE (A) NEGATIVE Final    Comment: RESULT CALLED TO, READ BACK BY AND VERIFIED WITH: M.KENNEY,RN 0017 12/31/19 G.MCADOO (NOTE) SARS-CoV-2 target nucleic acids are DETECTED. The SARS-CoV-2 RNA is generally detectable in upper and lower respiratory specimens during the acute phase of infection. Positive results are indicative of the presence of SARS-CoV-2 RNA. Clinical correlation with patient history and other diagnostic information is  necessary to determine patient infection status. Positive results do not rule out bacterial infection or co-infection with other viruses.  The expected result is Negative. Fact Sheet for Patients: SugarRoll.be Fact Sheet for Healthcare Providers: https://www.-mathews.com/ This test is not yet approved or cleared by the [place] FDA and  has been authorized for detection and/or diagnosis of SARS-CoV-2 by FDA under an Emergency Use Authorization (EUA). This EUA will remain  in effect (meaning this test can be used) for the du ration of the COVID-19 declaration under Section 564(b)(1) of the Act, 21 U.S.C. section 360bbb-3(b)(1), unless the authorization is terminated or revoked sooner. Performed at [facility], [place] [address] [place], [place] [address]    Culture, blood (routine x 2)     Status: None (Preliminary result)   Collection Time: 12/31/19  2:30 AM   Specimen: BLOOD LEFT WRIST  Result Value Ref Range Status   Specimen Description BLOOD LEFT WRIST  Final   Special Requests   Final    BOTTLES DRAWN AEROBIC ONLY Blood Culture adequate volume   Culture   Final    NO GROWTH 4 DAYS Performed at [facility], [place] [address] [place], [place] [address]    Report Status PENDING  Incomplete  Culture, blood (routine x 2)     Status: Abnormal   Collection Time: 12/31/19  2:35 AM   Specimen: BLOOD  Result Value Ref Range Status   Specimen Description BLOOD RIGHT ANTECUBITAL  Final   Special Requests   Final    BOTTLES DRAWN AEROBIC ONLY Blood Culture adequate volume   Culture  Setup Time   Final    AEROBIC BOTTLE ONLY GRAM POSITIVE COCCI CRITICAL RESULT CALLED TO, READ BACK BY AND VERIFIED WITH: KAREN AMEND [place] @ 8413 ON 01/01/20 BY ROBINSON Z.     Culture (A)  Final    STAPHYLOCOCCUS HOMINIS THE SIGNIFICANCE OF ISOLATING THIS ORGANISM FROM A SINGLE SET OF BLOOD CULTURES WHEN MULTIPLE SETS ARE DRAWN IS UNCERTAIN. PLEASE NOTIFY THE MICROBIOLOGY DEPARTMENT WITHIN ONE WEEK IF SPECIATION AND SENSITIVITIES ARE REQUIRED. Performed at [facility], [place] [address], [place], [place] [address]    Report Status 01/03/2020 FINAL  Final         Radiology Studies: No results found.  Scheduled Meds: . alfuzosin  10 mg Oral Daily  . aspirin EC  81 mg Oral Daily  . atorvastatin  20 mg Oral q1800  . dexamethasone (DECADRON) injection  6 mg Intravenous Q24H  . docusate sodium  100 mg Oral BID  . donepezil  10 mg Oral QHS  . enoxaparin (LOVENOX) injection  40 mg Subcutaneous Q24H  . finasteride  5 mg Oral Daily  . hydrALAZINE  50 mg Oral Q6H  . Ipratropium-Albuterol  1 puff Inhalation Q6H  . levothyroxine  75 mcg Oral Daily  . memantine  5 mg Oral BID  . pantoprazole  40 mg Oral Daily  . propranolol ER  60 mg Oral  Daily  . QUEtiapine  100 mg Oral QHS  . venlafaxine XR  75 mg Oral Q breakfast   Continuous Infusions:    LOS: 5 days   The patient is critically ill with multiple organ systems failure and requires high complexity decision making for assessment and support, frequent evaluation and titration of therapies, application of advanced monitoring technologies and extensive interpretation of multiple databases. Critical Care Time devoted to patient care services described in this note  Time spent: 40 minutes    Micayla Brathwaite, Geraldo Docker, MD [facility] Pager [phone]  If 7PM-7AM, please contact night-coverage [email or web address] Password [facility] 01/04/2020, 8:44 AM

## 2020-01-05 LAB — CULTURE, BLOOD (ROUTINE X 2)
Culture: NO GROWTH
Special Requests: ADEQUATE

## 2020-01-05 NOTE — Progress Notes (Signed)
Physical Therapy Treatment Patient Details Name: Douglas Grant MRN: [id number] DOB: 08/04/1930 Today's Date: 01/05/2020    History of Present Illness 86 year old WM PMHx dementia, CVA, HTN, chronic diastolic CHF, HLD, diabetes type 2 controlled without complication, previous tobacco abuse, thyroidism who presents with concern for shortness of breath from PCPs office where he was found to have hypoxia to the mid 80s on room air. Son in law has at bedside to give hx, pt progressively worse SOB and LE swelling w/ mild cough.     PT Comments    Pt making good progress. Continue to recommend ST-SNF.    Follow Up Recommendations  SNF     Equipment Recommendations  None recommended by PT    Recommendations for Other Services       Precautions / Restrictions Precautions Precautions: Fall Restrictions Weight Bearing Restrictions: No    Mobility  Bed Mobility Overal bed mobility: Needs Assistance Bed Mobility: Supine to Sit;Sit to Supine     Supine to sit: Min assist Sit to supine: Supervision   General bed mobility comments: Assist to elevate trunk into sitting  Transfers Overall transfer level: Needs assistance Equipment used: None;1 person hand held assist Transfers: Sit to/from [facility] Sit to Stand: Min guard Stand pivot transfers: Min guard       General transfer comment: Assist for lines/safety. Bed to bsc to chair  Ambulation/Gait Ambulation/Gait assistance: Min assist Gait Distance (Feet): 125 Feet Assistive device: 1 person hand held assist Gait Pattern/deviations: Step-to pattern;Decreased step length - right;Decreased step length - left;Shuffle;Trunk flexed Gait velocity: decr Gait velocity interpretation: <1.31 ft/sec, indicative of household ambulator General Gait Details: assist for balance. Verbal cues to take bigger steps   Stairs             Wheelchair Mobility    Modified Rankin (Stroke Patients Only)        Balance Overall balance assessment: Needs assistance Sitting-balance support: Feet supported;No upper extremity supported Sitting balance-Leahy Scale: Fair     Standing balance support: No upper extremity supported Standing balance-Leahy Scale: Fair Standing balance comment: static standing with min guard assist                            Cognition Arousal/Alertness: Awake/alert Behavior During Therapy: WFL for tasks assessed/performed Overall Cognitive Status: History of cognitive impairments - at baseline                                 General Comments: baseline dementia      Exercises      General Comments General comments (skin integrity, edema, etc.): SpO2 94-98% on RA      Pertinent Vitals/Pain Pain Assessment: No/denies pain    Home Living                      Prior Function            PT Goals (current goals can now be found in the care plan section) Acute Rehab PT Goals Patient Stated Goal: to go home Progress towards PT goals: Progressing toward goals    Frequency    Min 2X/week      PT Plan Current plan remains appropriate    Co-evaluation              AM-PAC PT "6 Clicks" Mobility   Outcome Measure  Help  needed turning from your back to your side while in a flat bed without using bedrails?: A Little Help needed moving from lying on your back to sitting on the side of a flat bed without using bedrails?: A Little Help needed moving to and from a bed to a chair (including a wheelchair)?: A Little Help needed standing up from a chair using your arms (e.g., wheelchair or bedside chair)?: A Little Help needed to walk in hospital room?: A Little Help needed climbing 3-5 steps with a railing? : A Lot 6 Click Score: 17    End of Session Equipment Utilized During Treatment: Gait belt Activity Tolerance: Patient tolerated treatment well Patient left: in bed;with call bell/phone within reach;with bed alarm  set   PT Visit Diagnosis: Other abnormalities of gait and mobility (R26.89);Muscle weakness (generalized) (M62.81)     Time: [id number] PT Time Calculation (min) (ACUTE ONLY): 37 min  Charges:  $Gait Training: 23-37 mins                     [place] Pager [phone] Office [place] 01/05/2020, 11:22 AM

## 2020-01-05 NOTE — Plan of Care (Signed)
[name]&Ox4 w/ slight hallucinations, saw "bugs crawling on the bed" at times. VSS, SpO2 > 88% on RA. C/o sacral pain, repositioned to comfort. Attempted  to get pt OOBTC but refused x3. Utilizing urinal at bedside w/ one spill. Complete linen change x1  Updated family via phone, all questions answered at this time  Will report to oncoming shift.  Douglas Grant    Problem: Education: Goal: Knowledge of risk factors and measures for prevention of condition will improve Outcome: Progressing   Problem: Coping: Goal: Psychosocial and spiritual needs will be supported Outcome: Progressing   Problem: Respiratory: Goal: Will maintain a patent airway Outcome: Progressing Goal: Complications related to the disease process, condition or treatment will be avoided or minimized Outcome: Progressing   Problem: Education: Goal: Knowledge of General Education information will improve Description: Including pain rating scale, medication(s)/side effects and non-pharmacologic comfort measures Outcome: Progressing   Problem: Health Behavior/Discharge Planning: Goal: Ability to manage health-related needs will improve Outcome: Progressing   Problem: Clinical Measurements: Goal: Ability to maintain clinical measurements within normal limits will improve Outcome: Progressing Goal: Will remain free from infection Outcome: Progressing Goal: Diagnostic test results will improve Outcome: Progressing Goal: Respiratory complications will improve Outcome: Progressing Goal: Cardiovascular complication will be avoided Outcome: Progressing   Problem: Activity: Goal: Risk for activity intolerance will decrease Outcome: Progressing   Problem: Nutrition: Goal: Adequate nutrition will be maintained Outcome: Progressing   Problem: Coping: Goal: Level of anxiety will decrease Outcome: Progressing   Problem: Elimination: Goal: Will not experience complications related to bowel motility Outcome:  Progressing Goal: Will not experience complications related to urinary retention Outcome: Progressing   Problem: Pain Managment: Goal: General experience of comfort will improve Outcome: Progressing   Problem: Safety: Goal: Ability to remain free from injury will improve Outcome: Progressing   Problem: Skin Integrity: Goal: Risk for impaired skin integrity will decrease Outcome: Progressing

## 2020-01-05 NOTE — Progress Notes (Addendum)
PROGRESS NOTE    Douglas Grant  OAC:[id number] DOB: [birth year] DOA: 12/29/2019 PCP: Shelda Pal, DO   Brief Narrative:  Douglas Grant  86 year old WM PMHx dementia, CVA, HTN, chronic diastolic CHF, HLD, diabetes type 2 controlled without complication, previous tobacco abuse, thyroidism  Presents with concern for shortness of breathfrom PCPs office where he was found to have hypoxia to the mid 80s on room air.  Son-in-law is at bedside andassisting with history.Reports he has had increasing shortness of breath over the last 3 weeks.Reports it has progressively been getting worse. Son-in-law reports that his lower extremity swelling has also increased. Reports that the patient had said that he could not sleep at night due to shortness of breath when he was laying down. The patient does report that he feels somewhat better when he sits up compared to when he lays down. Has no prior history of CHF. No history of COPD or asthma although he was a smoker for many years. Has never been on inhalers. Denies fevers, sick contacts. Reports some mild cough, but largestconcern of shortness of breath. No n/v/fevers/diarrhea/body aches. He has chronic nasal congestion which is unchanged.    Subjective: 1/12 A/O x1 (does not know where, when, why).  Very pleasant   Assessment & Plan:   Active Problems:   Hypothyroidism   Essential hypertension   Stroke (HCC)   CHF (congestive heart failure) (HCC)   Acute respiratory failure with hypoxia (HCC)   COVID-19 virus infection   Pneumonia due to COVID-19 virus   Diabetes mellitus type 2, controlled, without complications (HCC)   Chronic diastolic CHF (congestive heart failure) (HCC)   HLD (hyperlipidemia)   Pressure injury of skin   Covid pneumonia/acute respiratory failure with hypoxia COVID-19 Labs  Recent Labs    01/03/20 0416 01/04/20 0536  DDIMER 1.59* 1.77*  FERRITIN 32 29  CRP 0.6 0.6    Lab Results    Component Value Date   SARSCOV2NAA POSITIVE (A) 12/30/2019   SARSCOV2NAA POSITIVE (A) 12/29/2019   -Family upset they had instructed ED not to transport patient to [facility] until family member had a ride because they did not believe patient positive for Covid pneumonia.. -Family member informed that a false positive extremely unlikely and that a repeat Covid test is not standard of care.  However in order to appease family at their request a second Covid test ordered.   -Decadron 6 mg daily -Remdesivir per pharmacy protocol -Currently patient does not meet criteria for Actemra or Covid convalescent plasma -1/10 ambulatory SPO2 pending  Walking O2 Evaluation Sitting: 93-96% on Room air Standing: 92-95% on Room air Walking: 92-94% on Room air walking 20 feet (lower extremity weakness limiting distance) Patient placed back in chair after walking and reported no feelings of shortness of breath. Call light returned to patient, posey alarm on, video monitor in place. [facility] -Patient does not meet criteria for home O2  Chronic diastolic CHF -Strict in and out -2.9 L -Daily weight Filed Weights   01/02/20 0500 01/04/20 0500 01/05/20 0500  Weight: 85.5 kg 84.5 kg 85 kg  -1/9 increase Hydralazine po 50 mg QID -Propranolol ER 60 mg daily  Essential HTN -See CHF  Dementia/Hx stroke -Unknown baseline  Diabetes type 2 controlled without complication -1/7 hemoglobin A1c= 7.0 -Sensitive SSI  HLD -1/7 LDL= 97 -1/7 Lipitor 20 mg daily  Hypothyroidism -Synthroid 75 mcg daily  Stage I buttocks ulcer bilateral Pressure Injury 12/30/19 Buttocks Bilateral Stage 1 -  Intact skin  with non-blanchable redness of a localized area usually over a bony prominence. (Active)  12/30/19 1230  Location: Buttocks  Location Orientation: Bilateral  Staging: Stage 1 -  Intact skin with non-blanchable redness of a localized area usually over a bony prominence.  Wound Description (Comments):   Present  on Admission: Yes   Goals of care -1/8 PT/OT consult; evaluate for SNF  -1/9 consult LCSW; patient cannot return home as all 3 of his children work in the healthcare field and would need to be quarantined themselves.   -1/12 per LCSW; patient may have a bed by Wednesday or Thursday    DVT prophylaxis: Lovenox Code Status: Full Family Communication: 01/05/2020 spoke with Barbaraann Rondo (son) [phone].  Counseled on plan of care answered all questions Disposition Plan: TBD   Consultants:    Procedures/Significant Events:     I have personally reviewed and interpreted all radiology studies and my findings are as above.  VENTILATOR SETTINGS: Room air 1/12 SPO2 93%   Cultures   Antimicrobials:  12/30/19 1600  remdesivir 100 mg in sodium chloride 0.9 % 100 mL IVPB     100 mg 200 mL/hr over 30 Minutes 01/03/20 0959   12/30/19 1230  remdesivir 200 mg in sodium chloride 0.9% 250 mL IVPB  Status:  Discontinued     200 mg 580 mL/hr over 30 Minutes 12/30/19 1234   12/30/19 0000  remdesivir 100 mg in sodium chloride 0.9 % 100 mL IVPB     100 mg 200 mL/hr over 30 Minutes 12/30/19 0848       Devices    LINES / TUBES:      Continuous Infusions:    Objective: Vitals:   01/05/20 0517 01/05/20 0736 01/05/20 0900 01/05/20 1136  BP: (!) 154/53 (!) 146/45 (!) 145/59 (!) 132/59  Pulse: (!) 59 65 69 70  Resp: 15 20  (!) 24  Temp: 98.5 F (36.9 C) 98.5 F (36.9 C)    TempSrc: Oral Oral    SpO2: 91% 95%  95%  Weight:      Height:        Intake/Output Summary (Last 24 hours) at 01/05/2020 1202 Last data filed at 01/05/2020 0741 Gross per 24 hour  Intake 440 ml  Output 550 ml  Net -110 ml   Filed Weights   01/02/20 0500 01/04/20 0500 01/05/20 0500  Weight: 85.5 kg 84.5 kg 85 kg    Physical Exam:  General: A/O x1 (does not know where, when, why).  Pleasantly confused, no acute respiratory distress Eyes: negative scleral hemorrhage, negative anisocoria, negative  icterus ENT: Negative Runny nose, negative gingival bleeding, Neck:  Negative scars, masses, torticollis, lymphadenopathy, JVD Lungs: Clear to auscultation bilaterally without wheezes or crackles Cardiovascular: Regular rate and rhythm without murmur gallop or rub normal S1 and S2 Abdomen: negative abdominal pain, nondistended, positive soft, bowel sounds, no rebound, no ascites, no appreciable mass Extremities: No significant cyanosis, clubbing, or edema bilateral lower extremities Skin: Negative rashes, lesions, ulcers Psychiatric:  Negative depression, negative anxiety, negative fatigue, negative mania  Central nervous system:  Cranial nerves II through XII intact, tongue/uvula midline, all extremities muscle strength 5/5, sensation intact throughout,  negative dysarthria, negative expressive aphasia, negative receptive aphasia.   .     Data Reviewed: Care during the described time interval was provided by me .  I have reviewed this patient's available data, including medical history, events of note, physical examination, and all test results as part of my evaluation.   CBC:  Recent Labs  Lab 12/31/19 0100 01/01/20 0126 01/02/20 0358 01/03/20 0416 01/04/20 0536  WBC 9.9 9.7 9.8 7.9 10.1  NEUTROABS 6.6 7.4 7.3 5.7 7.0  HGB 12.5* 11.6* 12.2* 11.2* 11.9*  HCT 39.6 38.0* 39.1 36.2* 38.1*  MCV 94.7 94.8 95.6 93.5 92.5  PLT 433* 437* 457* 399 335*   Basic Metabolic Panel: Recent Labs  Lab 12/31/19 0100 01/01/20 0126 01/02/20 0358 01/03/20 0416 01/04/20 0536  NA 138 137 137 136 136  K 4.1 4.1 4.6 4.3 4.3  CL 93* 92* 91* 92* 93*  CO2 36* 35* 37* 37* 34*  GLUCOSE 111* 107* 113* 112* 133*  BUN 25* 33* 31* 34* 43*  CREATININE 1.18 1.18 1.07 1.12 1.39*  CALCIUM 8.0* 8.3* 8.6* 8.2* 8.5*  MG 1.8 1.8 1.9 1.9 1.8  PHOS 4.3 4.1 3.8 4.4 3.9   GFR: Estimated Creatinine Clearance: 40.5 mL/min (A) (by C-G formula based on SCr of 1.39 mg/dL (H)). Liver Function Tests: Recent Labs    Lab 12/31/19 0100 01/01/20 0126 01/02/20 0358 01/03/20 0416 01/04/20 0536  AST 17 18 20 16  14*  ALT 18 16 18 18 18   ALKPHOS 45 42 39 37* 32*  BILITOT 0.5 0.2* 0.6 0.5 0.8  PROT 6.3* 6.1* 6.3* 5.8* 5.8*  ALBUMIN 2.5* 2.5* 2.7* 2.5* 2.5*   No results for input(s): LIPASE, AMYLASE in the last 168 hours. No results for input(s): AMMONIA in the last 168 hours. Coagulation Profile: No results for input(s): INR, PROTIME in the last 168 hours. Cardiac Enzymes: No results for input(s): CKTOTAL, CKMB, CKMBINDEX, TROPONINI in the last 168 hours. BNP (last 3 results) No results for input(s): PROBNP in the last 8760 hours. HbA1C: No results for input(s): HGBA1C in the last 72 hours. CBG: No results for input(s): GLUCAP in the last 168 hours. Lipid Profile: No results for input(s): CHOL, HDL, LDLCALC, TRIG, CHOLHDL, LDLDIRECT in the last 72 hours. Thyroid Function Tests: No results for input(s): TSH, T4TOTAL, FREET4, T3FREE, THYROIDAB in the last 72 hours. Anemia Panel: Recent Labs    01/03/20 0416 01/04/20 0536  FERRITIN 32 29   Urine analysis:    Component Value Date/Time   COLORURINE YELLOW 08/31/2018 1342   APPEARANCEUR CLEAR 08/31/2018 1342   LABSPEC 1.025 08/31/2018 1342   PHURINE 5.5 08/31/2018 1342   GLUCOSEU NEGATIVE 08/31/2018 1342   HGBUR SMALL (A) 08/31/2018 1342   HGBUR negative 07/13/2008 1535   BILIRUBINUR negative 07/29/2019 1022   KETONESUR NEGATIVE 08/31/2018 1342   PROTEINUR Positive (A) 07/29/2019 1022   PROTEINUR 30 (A) 08/31/2018 1342   UROBILINOGEN 0.2 07/29/2019 1022   UROBILINOGEN 1.0 04/21/2011 0643   NITRITE negative 07/29/2019 1022   NITRITE NEGATIVE 08/31/2018 1342   LEUKOCYTESUR Negative 07/29/2019 1022   Sepsis Labs: @LABRCNTIP (procalcitonin:4,lacticidven:4)  ) Recent Results (from the past 240 hour(s))  SARS Coronavirus 2 Ag (30 min TAT) - Nasal Swab (BD Veritor Kit)     Status: None   Collection Time: 12/29/19  4:13 PM   Specimen:  Nasal Swab (BD Veritor Kit)  Result Value Ref Range Status   SARS Coronavirus 2 Ag NEGATIVE NEGATIVE Final    Comment: (NOTE) SARS-CoV-2 antigen NOT DETECTED.  Negative results are presumptive.  Negative results do not preclude SARS-CoV-2 infection and should not be used as the sole basis for treatment or other patient management decisions, including infection  control decisions, particularly in the presence of clinical signs and  symptoms consistent with COVID-19, or in those who have been in contact  with the virus.  Negative results must be combined with clinical observations, patient history, and epidemiological information. The expected result is Negative. Fact Sheet for Patients: PodPark.tn Fact Sheet for Healthcare Providers: GiftContent.is This test is not yet approved or cleared by the [place] FDA and  has been authorized for detection and/or diagnosis of SARS-CoV-2 by FDA under an Emergency Use Authorization (EUA).  This EUA will remain in effect (meaning this test can be used) for the duration of  the COVID-19 de claration under Section 564(b)(1) of the Act, 21 U.S.C. section 360bbb-3(b)(1), unless the authorization is terminated or revoked sooner. Performed at [facility], [address] [place], [place] [address]   SARS Coronavirus 2 by RT PCR (hospital order, performed in [facility] hospital lab) Nasopharyngeal Nasopharyngeal Swab     Status: Abnormal   Collection Time: 12/29/19  8:30 PM   Specimen: Nasopharyngeal Swab  Result Value Ref Range Status   SARS Coronavirus 2 POSITIVE (A) NEGATIVE Final    Comment: RESULT CALLED TO, READ BACK BY AND VERIFIED WITH: KELLIE NEAL RN AT 2311 ON 12/29/19 BY I.SUGUT (NOTE) SARS-CoV-2 target nucleic acids are DETECTED SARS-CoV-2 RNA is generally detectable in upper respiratory specimens  during the acute phase of infection.  Positive results are indicative    of the presence of the identified virus, but do not rule out bacterial infection or co-infection with other pathogens not detected by the test.  Clinical correlation with patient history and  other diagnostic information is necessary to determine patient infection status.  The expected result is negative. Fact Sheet for Patients:   StrictlyIdeas.no  Fact Sheet for Healthcare Providers:   BankingDealers.co.za   This test is not yet approved or cleared by the [place] FDA and  has been authorized for detection and/or diagnosis of SARS-CoV-2 by FDA under an Emergency Use Authorization (EUA).  This EUA will remain in effect (meaning this t est can be used) for the duration of  the COVID-19 declaration under Section 564(b)(1) of the Act, 21 U.S.C. section 360-bbb-3(b)(1), unless the authorization is terminated or revoked sooner. Performed at [facility], [address] [place], [place] [address]   Blood Culture (routine x 2)     Status: Abnormal   Collection Time: 12/29/19 11:59 PM   Specimen: BLOOD  Result Value Ref Range Status   Specimen Description   Final    BLOOD LEFT ANTECUBITAL Performed at [facility], [address] [place], [place] [address]    Special Requests   Final    BOTTLES DRAWN AEROBIC AND ANAEROBIC Blood Culture adequate volume Performed at [facility], [address] [place], [place] [address]    Culture  Setup Time GRAM POSITIVE COCCI AEROBIC BOTTLE ONLY   Final   Culture (A)  Final    STAPHYLOCOCCUS CAPITIS THE SIGNIFICANCE OF ISOLATING THIS ORGANISM FROM A SINGLE SET OF BLOOD CULTURES WHEN MULTIPLE SETS ARE DRAWN IS UNCERTAIN. PLEASE NOTIFY THE MICROBIOLOGY DEPARTMENT WITHIN ONE WEEK IF SPECIATION AND SENSITIVITIES ARE REQUIRED. Performed at [facility], [place] [address], [place], [place] [address]    Report Status 01/03/2020 FINAL  Final  Blood Culture ID Panel  (Reflexed)     Status: Abnormal   Collection Time: 12/29/19 11:59 PM  Result Value Ref Range Status   Enterococcus species NOT DETECTED NOT DETECTED Final   Listeria monocytogenes NOT DETECTED NOT DETECTED Final   Staphylococcus species DETECTED (  A) NOT DETECTED Final    Comment: Methicillin (oxacillin) susceptible coagulase negative staphylococcus. Possible blood culture contaminant (unless isolated from more than one blood culture draw or clinical case suggests pathogenicity). No antibiotic treatment is indicated for blood  culture contaminants. CRITICAL VALUE NOTED.  VALUE IS CONSISTENT WITH PREVIOUSLY REPORTED AND CALLED VALUE. SEE [id number]    Staphylococcus aureus (BCID) NOT DETECTED NOT DETECTED Final   Methicillin resistance NOT DETECTED NOT DETECTED Final   Streptococcus species NOT DETECTED NOT DETECTED Final   Streptococcus agalactiae NOT DETECTED NOT DETECTED Final   Streptococcus pneumoniae NOT DETECTED NOT DETECTED Final   Streptococcus pyogenes NOT DETECTED NOT DETECTED Final   Acinetobacter baumannii NOT DETECTED NOT DETECTED Final   Enterobacteriaceae species NOT DETECTED NOT DETECTED Final   Enterobacter cloacae complex NOT DETECTED NOT DETECTED Final   Escherichia coli NOT DETECTED NOT DETECTED Final   Klebsiella oxytoca NOT DETECTED NOT DETECTED Final   Klebsiella pneumoniae NOT DETECTED NOT DETECTED Final   Proteus species NOT DETECTED NOT DETECTED Final   Serratia marcescens NOT DETECTED NOT DETECTED Final   Haemophilus influenzae NOT DETECTED NOT DETECTED Final   Neisseria meningitidis NOT DETECTED NOT DETECTED Final   Pseudomonas aeruginosa NOT DETECTED NOT DETECTED Final   Candida albicans NOT DETECTED NOT DETECTED Final   Candida glabrata NOT DETECTED NOT DETECTED Final   Candida krusei NOT DETECTED NOT DETECTED Final   Candida parapsilosis NOT DETECTED NOT DETECTED Final   Candida tropicalis NOT DETECTED NOT DETECTED Final    Comment: Performed at [facility], [address] [place], [place] [address]  Blood Culture (routine x 2)     Status: Abnormal   Collection Time: 12/30/19 12:05 AM   Specimen: BLOOD LEFT WRIST  Result Value Ref Range Status   Specimen Description   Final    BLOOD LEFT WRIST Performed at [facility], [address] [place], [place] [address]    Special Requests   Final    BOTTLES DRAWN AEROBIC AND ANAEROBIC Blood Culture adequate volume Performed at [facility], [address] [place], [place] [address]    Culture  Setup Time   Final    GRAM POSITIVE COCCI IN CLUSTERS IN BOTH AEROBIC AND ANAEROBIC BOTTLES CRITICAL RESULT CALLED TO, READ BACK BY AND VERIFIED WITH: K AMEND PHARMD [id number] 12/30/19 A BROWNING    Culture (A)  Final    STAPHYLOCOCCUS EPIDERMIDIS THE SIGNIFICANCE OF ISOLATING THIS ORGANISM FROM A SINGLE SET OF BLOOD CULTURES WHEN MULTIPLE SETS ARE DRAWN IS UNCERTAIN. PLEASE NOTIFY THE MICROBIOLOGY DEPARTMENT WITHIN ONE WEEK IF SPECIATION AND SENSITIVITIES ARE REQUIRED. Performed at [facility], [place] [address], [place], [place] [address]    Report Status 01/03/2020 FINAL  Final  Respiratory Panel by PCR     Status: None   Collection Time: 12/30/19 12:22 PM   Specimen: Nasopharyngeal Swab; Respiratory  Result Value Ref Range Status   Adenovirus NOT DETECTED NOT DETECTED Final   Coronavirus 229E NOT DETECTED NOT DETECTED Final    Comment: (NOTE) The Coronavirus on the Respiratory Panel, DOES NOT test for the novel  Coronavirus (2019 nCoV)    Coronavirus HKU1 NOT DETECTED NOT DETECTED Final   Coronavirus NL63 NOT DETECTED NOT DETECTED Final   Coronavirus OC43 NOT DETECTED NOT DETECTED Final   Metapneumovirus NOT DETECTED NOT DETECTED Final   Rhinovirus / Enterovirus NOT DETECTED NOT DETECTED Final   Influenza A NOT DETECTED NOT DETECTED Final  Influenza B NOT DETECTED NOT DETECTED Final   Parainfluenza Virus 1 NOT DETECTED NOT DETECTED Final   Parainfluenza Virus 2  NOT DETECTED NOT DETECTED Final   Parainfluenza Virus 3 NOT DETECTED NOT DETECTED Final   Parainfluenza Virus 4 NOT DETECTED NOT DETECTED Final   Respiratory Syncytial Virus NOT DETECTED NOT DETECTED Final   Bordetella pertussis NOT DETECTED NOT DETECTED Final   Chlamydophila pneumoniae NOT DETECTED NOT DETECTED Final   Mycoplasma pneumoniae NOT DETECTED NOT DETECTED Final    Comment: Performed at [facility], [place] [address] [place], [place] [address]  SARS CORONAVIRUS 2 (TAT 6-24 HRS) Nasopharyngeal Nasopharyngeal Swab     Status: Abnormal   Collection Time: 12/30/19  2:49 PM   Specimen: Nasopharyngeal Swab  Result Value Ref Range Status   SARS Coronavirus 2 POSITIVE (A) NEGATIVE Final    Comment: RESULT CALLED TO, READ BACK BY AND VERIFIED WITH: M.KENNEY,RN 0017 12/31/19 G.[email or web address] (NOTE) SARS-CoV-2 target nucleic acids are DETECTED. The SARS-CoV-2 RNA is generally detectable in upper and lower respiratory specimens during the acute phase of infection. Positive results are indicative of the presence of SARS-CoV-2 RNA. Clinical correlation with patient history and other diagnostic information is  necessary to determine patient infection status. Positive results do not rule out bacterial infection or co-infection with other viruses.  The expected result is Negative. Fact Sheet for Patients: SugarRoll.be Fact Sheet for Healthcare Providers: https://www.-mathews.com/ This test is not yet approved or cleared by the [place] FDA and  has been authorized for detection and/or diagnosis of SARS-CoV-2 by FDA under an Emergency Use Authorization (EUA). This EUA will remain  in effect (meaning this test can be used) for the du ration of the COVID-19 declaration under Section 564(b)(1) of the Act, 21 U.S.C. section 360bbb-3(b)(1), unless the authorization is terminated or revoked sooner. Performed at [facility], [place] [address], [place], [place] [address]   Culture, blood (routine x 2)     Status: None (Preliminary result)   Collection Time: 12/31/19  2:30 AM   Specimen: BLOOD LEFT WRIST  Result Value Ref Range Status   Specimen Description BLOOD LEFT WRIST  Final   Special Requests   Final    BOTTLES DRAWN AEROBIC ONLY Blood Culture adequate volume   Culture   Final    NO GROWTH 4 DAYS Performed at [facility], [place] [address], [place], [place] [address]    Report Status PENDING  Incomplete  Culture, blood (routine x 2)     Status: Abnormal   Collection Time: 12/31/19  2:35 AM   Specimen: BLOOD  Result Value Ref Range Status   Specimen Description BLOOD RIGHT ANTECUBITAL  Final   Special Requests   Final    BOTTLES DRAWN AEROBIC ONLY Blood Culture adequate volume   Culture  Setup Time   Final    AEROBIC BOTTLE ONLY GRAM POSITIVE COCCI CRITICAL RESULT CALLED TO, READ BACK BY AND VERIFIED WITH: KAREN AMEND [place] @ 6256 ON 01/01/20 BY ROBINSON Z.     Culture (A)  Final    STAPHYLOCOCCUS HOMINIS THE SIGNIFICANCE OF ISOLATING THIS ORGANISM FROM A SINGLE SET OF BLOOD CULTURES WHEN MULTIPLE SETS ARE DRAWN IS UNCERTAIN. PLEASE NOTIFY THE MICROBIOLOGY DEPARTMENT WITHIN ONE WEEK IF SPECIATION AND SENSITIVITIES ARE REQUIRED. Performed at [facility], [place] [address], [place], [place] [address]    Report Status 01/03/2020 FINAL  Final         Radiology Studies:  No results found.      Scheduled Meds: . alfuzosin  10 mg Oral Daily  . aspirin EC  81 mg Oral Daily  . atorvastatin  20 mg Oral q1800  . dexamethasone (DECADRON) injection  6 mg Intravenous Q24H  . docusate sodium  100 mg Oral BID  . donepezil  10 mg Oral QHS  . enoxaparin (LOVENOX) injection  40 mg Subcutaneous Q24H  . finasteride  5 mg Oral Daily  . hydrALAZINE  50 mg Oral Q6H  . Ipratropium-Albuterol  1 puff Inhalation Q6H  . levothyroxine  75 mcg Oral Daily  . memantine  5 mg Oral BID  . pantoprazole  40 mg Oral Daily  .  propranolol ER  60 mg Oral Daily  . QUEtiapine  100 mg Oral QHS  . venlafaxine XR  75 mg Oral Q breakfast   Continuous Infusions:    LOS: 6 days   The patient is critically ill with multiple organ systems failure and requires high complexity decision making for assessment and support, frequent evaluation and titration of therapies, application of advanced monitoring technologies and extensive interpretation of multiple databases. Critical Care Time devoted to patient care services described in this note  Time spent: 40 minutes    Tyee Vandevoorde, Geraldo Docker, MD [facility] Pager [phone]  If 7PM-7AM, please contact night-coverage [email or web address] Password [facility] 01/05/2020, 12:02 PM

## 2020-01-05 NOTE — Consult Note (Signed)
   [facility] CM Inpatient Consult   01/05/2020  Douglas Grant 29-Jul-1930 [id number]  Patient screened for high risk score for unplanned readmission and for long hospitalization stay to check if potential [place] Management services are needed.   Review of patient's medical record reveals patient is per MD notes:  61 year oldWM PMHxdementia, CVA, HTN, chronic diastolic CHF, HLD, diabetes type 2 controlled without complication, previous tobacco abuse,thyroidism, was found to be hypoxia to the mid 80s on room air and found to have COVID-19 virus infection.   Primary Care Provider is Nani Ravens, Crosby Oyster, DO, [facility]  Plan:  Currently being recommended for a skilled nursing facility [SNF].  Will continue to follow progress and disposition to assess for post hospital care management needs.  Patient in the Medicare [place] can be followed at a [facility] affiliated SNF, if transitioned there.  Please place a [facility] Care Management consult as appropriate and for questions contact:   Natividad Brood, RN BSN [facility] Liaison  [phone] business mobile phone Toll free office [phone]  Fax number: [phone] [email or web address]

## 2020-01-05 NOTE — TOC Progression Note (Signed)
Transition of Care [facility]) - Progression Note    Patient Details  Name: Douglas Grant MRN: [id number] Date of Birth: November 17, 1930  Transition of Care [facility]) CM/SW Contact  Shade Flood, LCSW Phone Number: 01/05/2020, 10:10 AM  Clinical Narrative:     Plan is for SNF at dc. Family have selected [facility]. Per Carolyne Fiscal at [place], they will have bed for pt on Thursday. MD updated yesterday. TOC will follow.  Expected Discharge Plan: Skilled Nursing Facility Barriers to Discharge: No SNF bed  Expected Discharge Plan and Services Expected Discharge Plan: [place] In-house Referral: Clinical Social Work   Post Acute Care Choice: [place] Living arrangements for the past 2 months: Single Family Home                                       Social Determinants of Health (SDOH) Interventions    Readmission Risk Interventions No flowsheet data found.

## 2020-01-06 LAB — CREATININE, SERUM
Creatinine, Ser: 1.27 mg/dL — ABNORMAL HIGH (ref 0.61–1.24)
GFR calc Af Amer: 58 mL/min — ABNORMAL LOW (ref >60)
GFR calc non Af Amer: 50 mL/min — ABNORMAL LOW (ref >60)

## 2020-01-06 MED ORDER — HYDROCOD POLST-CPM POLST ER 10-8 MG/5ML PO SUER: 5.0000 mL | Freq: Every evening | ORAL | Status: DC | PRN

## 2020-01-06 NOTE — TOC Progression Note (Signed)
Transition of Care [facility]) - Progression Note    Patient Details  Name: CLOUD OBERBROECKLING MRN: [id number] Date of Birth: 1930/01/13  [facility]) CM/SW Contact  Shade Flood, LCSW Phone Number: 01/06/2020, 11:57 AM  Clinical Narrative:     TOC following. Spoke with Carolyne Fiscal at [facility] regarding bed for pt. Per Carolyne Fiscal, they will have bed for pt tomorrow.   Spoke with pt's daughter, [name], just now to update. Informed [name] that there are no new bed offers at this time. She remains in agreement with dc plan and requests that she be notified prior to transfer. Asked Melvenia Beam if she could reach out to daughter to answer questions.  Updated MD. Will follow up tomorrow.  Expected Discharge Plan: Skilled Nursing Facility Barriers to Discharge: No SNF bed  Expected Discharge Plan and Services Expected Discharge Plan: [place] In-house Referral: Clinical Social Work   Post Acute Care Choice: [place] Living arrangements for the past 2 months: Single Family Home                                       Social Determinants of Health (SDOH) Interventions    Readmission Risk Interventions No flowsheet data found.

## 2020-01-06 NOTE — Progress Notes (Signed)
Pt refused ambulation. Pt agitated, stating that both the bed and the chair hurt his tail bone and he does not want to ambulate due to this. Informed RN.

## 2020-01-06 NOTE — Progress Notes (Signed)
PROGRESS NOTE    Douglas Grant  [id number] DOB: 20-Jun-1930 DOA: 12/29/2019 PCP: Shelda Pal, DO    Brief Narrative:  86 year old male who presented with dyspnea.  He does have significant past medical history of dementia, CVA, hypertension, diastolic heart failure, and type 2 diabetes mellitus.  Patient had worsening dyspnea for about 3 weeks, associated with lower extremity edema and orthopnea.  Primary care provider where his oximetry was found mid 80s.  On his initial physical examination his blood pressure was 191/96, heart rate 95, respiratory rate 18, temperature 99.5, oximetry 88%.  His lungs had decreased breath sounds bilaterally without wheezing or rails, heart S1-S2 present, rhythmic, abdomen soft, no significant lower extremity edema. SARS COVID-19 positive.  Chest radiograph with bilateral interstitial infiltrates, predominantly right upper lobe.  Patient was admitted to the hospital working diagnosis of acute hypoxic respiratory failure due to SARS COVID-19 viral pneumonia.   Assessment & Plan:   Active Problems:   Hypothyroidism   Essential hypertension   Stroke (HCC)   CHF (congestive heart failure) (HCC)   Acute respiratory failure with hypoxia ([place])   COVID-19 virus infection   Pneumonia due to COVID-19 virus   Diabetes mellitus type 2, controlled, without complications (HCC)   Chronic diastolic CHF (congestive heart failure) (HCC)   HLD (hyperlipidemia)   Pressure injury of skin   1.  Acute hypoxic respiratory failure due to SARS COVID-19 viral pneumonia.  RR: 18  Pulse oxymetry: 97%  Fi02: 21% room air.   COVID-19 Labs  Recent Labs    01/04/20 0536  DDIMER 1.77*  FERRITIN 29  CRP 0.6    Lab Results  Component Value Date   SARSCOV2NAA POSITIVE (A) 12/30/2019   SARSCOV2NAA POSITIVE (A) 12/29/2019    Patient has completed medical therapy (remdesivir and steroids) with improvement of oxygenation, butr continue to be very weak and  deconditioned.   Patient will need SNF at discharge.  2. HTN. Continue blood pressure control.  3. Uncontrolled T2dM with steroids induced hyperglycemia. Continue glucose cover and monitoring with insulin sliding scale, will dc steroids today.  4. Hypothyroid and dyslipidemia. Continue statin and levothyroxine.   5. Stage 1 bilateral buttocks pressure ulcer, present on admission, continue local wound care.   6. Dementia. Continue with donepezil and memantine. Seroquel.   DVT prophylaxis: enoxaparin   Code Status:  full Family Communication: no family at the bedsde Disposition Plan/ discharge barriers: SNF in am.    Nutrition Status:           Skin Documentation: Pressure Injury 12/30/19 Buttocks Bilateral Stage 1 -  Intact skin with non-blanchable redness of a localized area usually over a bony prominence. (Active)  12/30/19 1230  Location: Buttocks  Location Orientation: Bilateral  Staging: Stage 1 -  Intact skin with non-blanchable redness of a localized area usually over a bony prominence.  Wound Description (Comments):   Present on Admission: Yes      Subjective: Patient this am is out of bed to chair, dyspnea has improved, but continue to be very weak and deconditioned, not back to baseline, no chest pain.   Objective: Vitals:   01/06/20 0107 01/06/20 0500 01/06/20 0538 01/06/20 0721  BP: (!) 122/46  (!) 168/65   Pulse: (!) 54  (!) 51   Resp: 16   18  Temp: 98.6 F (37 C)  98.7 F (37.1 C) 97.6 F (36.4 C)  TempSrc: Oral  Oral Oral  SpO2: 96%  97%   Weight:  84.8 kg    Height:        Intake/Output Summary (Last 24 hours) at 01/06/2020 0945 Last data filed at 01/06/2020 0600 Gross per 24 hour  Intake 360 ml  Output 600 ml  Net -240 ml   Filed Weights   01/04/20 0500 01/05/20 0500 01/06/20 0500  Weight: 84.5 kg 85 kg 84.8 kg    Examination:   General: Not in pain or dyspnea, deconditioned  Neurology: Awake and alert, non focal  E ENT: mild  pallor, no icterus, oral mucosa moist Cardiovascular: No JVD. S1-S2 present, rhythmic, no gallops, rubs, or murmurs. No lower extremity edema. Pulmonary: positive breath sounds bilaterally. Gastrointestinal. Abdomen with no organomegaly, non tender, no rebound or guarding Skin. No rashes Musculoskeletal: no joint deformities     Data Reviewed: I have personally reviewed following labs and imaging studies  CBC: Recent Labs  Lab 12/31/19 0100 01/01/20 0126 01/02/20 0358 01/03/20 0416 01/04/20 0536  WBC 9.9 9.7 9.8 7.9 10.1  NEUTROABS 6.6 7.4 7.3 5.7 7.0  HGB 12.5* 11.6* 12.2* 11.2* 11.9*  HCT 39.6 38.0* 39.1 36.2* 38.1*  MCV 94.7 94.8 95.6 93.5 92.5  PLT 433* 437* 457* 399 [id number]*   Basic Metabolic Panel: Recent Labs  Lab 12/31/19 0100 01/01/20 0126 01/02/20 0358 01/03/20 0416 01/04/20 0536 01/06/20 0408  NA 138 137 137 136 136  --   K 4.1 4.1 4.6 4.3 4.3  --   CL 93* 92* 91* 92* 93*  --   CO2 36* 35* 37* 37* 34*  --   GLUCOSE 111* 107* 113* 112* 133*  --   BUN 25* 33* 31* 34* 43*  --   CREATININE 1.18 1.18 1.07 1.12 1.39* 1.27*  CALCIUM 8.0* 8.3* 8.6* 8.2* 8.5*  --   MG 1.8 1.8 1.9 1.9 1.8  --   PHOS 4.3 4.1 3.8 4.4 3.9  --    GFR: Estimated Creatinine Clearance: 41 mL/min (A) (by C-G formula based on SCr of 1.27 mg/dL (H)). Liver Function Tests: Recent Labs  Lab 12/31/19 0100 01/01/20 0126 01/02/20 0358 01/03/20 0416 01/04/20 0536  AST 17 18 20 16  14*  ALT 18 16 18 18 18   ALKPHOS 45 42 39 37* 32*  BILITOT 0.5 0.2* 0.6 0.5 0.8  PROT 6.3* 6.1* 6.3* 5.8* 5.8*  ALBUMIN 2.5* 2.5* 2.7* 2.5* 2.5*   No results for input(s): LIPASE, AMYLASE in the last 168 hours. No results for input(s): AMMONIA in the last 168 hours. Coagulation Profile: No results for input(s): INR, PROTIME in the last 168 hours. Cardiac Enzymes: No results for input(s): CKTOTAL, CKMB, CKMBINDEX, TROPONINI in the last 168 hours. BNP (last 3 results) No results for input(s): PROBNP in the  last 8760 hours. HbA1C: No results for input(s): HGBA1C in the last 72 hours. CBG: No results for input(s): GLUCAP in the last 168 hours. Lipid Profile: No results for input(s): CHOL, HDL, LDLCALC, TRIG, CHOLHDL, LDLDIRECT in the last 72 hours. Thyroid Function Tests: No results for input(s): TSH, T4TOTAL, FREET4, T3FREE, THYROIDAB in the last 72 hours. Anemia Panel: Recent Labs    01/04/20 0536  FERRITIN 50      Radiology Studies: I have reviewed all of the imaging during this hospital visit personally     Scheduled Meds: . alfuzosin  10 mg Oral Daily  . aspirin EC  81 mg Oral Daily  . atorvastatin  20 mg Oral q1800  . dexamethasone (DECADRON) injection  6 mg Intravenous Q24H  . docusate sodium  100 mg Oral BID  . donepezil  10 mg Oral QHS  . enoxaparin (LOVENOX) injection  40 mg Subcutaneous Q24H  . finasteride  5 mg Oral Daily  . hydrALAZINE  50 mg Oral Q6H  . Ipratropium-Albuterol  1 puff Inhalation Q6H  . levothyroxine  75 mcg Oral Daily  . memantine  5 mg Oral BID  . pantoprazole  40 mg Oral Daily  . propranolol ER  60 mg Oral Daily  . QUEtiapine  100 mg Oral QHS  . venlafaxine XR  75 mg Oral Q breakfast   Continuous Infusions:   LOS: 7 days        Gayleen Sholtz Gerome Apley, MD

## 2020-01-06 NOTE — Plan of Care (Signed)
  Problem: Education: Goal: Knowledge of risk factors and measures for prevention of condition will improve Outcome: Progressing   Problem: Coping: Goal: Psychosocial and spiritual needs will be supported Outcome: Progressing   Problem: Respiratory: Goal: Will maintain a patent airway Outcome: Progressing Goal: Complications related to the disease process, condition or treatment will be avoided or minimized Outcome: Progressing   Problem: Nutrition: Goal: Adequate nutrition will be maintained Outcome: Progressing   Problem: Coping: Goal: Level of anxiety will decrease Outcome: Progressing   Problem: Elimination: Goal: Will not experience complications related to bowel motility Outcome: Progressing Goal: Will not experience complications related to urinary retention Outcome: Progressing   Problem: Pain Managment: Goal: General experience of comfort will improve Outcome: Progressing   Problem: Safety: Goal: Ability to remain free from injury will improve Outcome: Progressing

## 2020-01-06 NOTE — Plan of Care (Signed)

## 2020-01-07 DIAGNOSIS — J1282 Pneumonia due to coronavirus disease 2019: Secondary | ICD-10-CM | POA: Diagnosis not present

## 2020-01-07 DIAGNOSIS — I5032 Chronic diastolic (congestive) heart failure: Secondary | ICD-10-CM | POA: Diagnosis not present

## 2020-01-07 DIAGNOSIS — F39 Unspecified mood [affective] disorder: Secondary | ICD-10-CM | POA: Diagnosis not present

## 2020-01-07 DIAGNOSIS — N400 Enlarged prostate without lower urinary tract symptoms: Secondary | ICD-10-CM | POA: Diagnosis not present

## 2020-01-07 DIAGNOSIS — E785 Hyperlipidemia, unspecified: Secondary | ICD-10-CM

## 2020-01-07 DIAGNOSIS — E669 Obesity, unspecified: Secondary | ICD-10-CM

## 2020-01-07 DIAGNOSIS — L89151 Pressure ulcer of sacral region, stage 1: Secondary | ICD-10-CM | POA: Diagnosis not present

## 2020-01-07 DIAGNOSIS — U071 COVID-19: Secondary | ICD-10-CM | POA: Diagnosis not present

## 2020-01-07 DIAGNOSIS — I639 Cerebral infarction, unspecified: Secondary | ICD-10-CM | POA: Diagnosis not present

## 2020-01-07 DIAGNOSIS — R443 Hallucinations, unspecified: Secondary | ICD-10-CM | POA: Diagnosis not present

## 2020-01-07 DIAGNOSIS — E1169 Type 2 diabetes mellitus with other specified complication: Secondary | ICD-10-CM

## 2020-01-07 DIAGNOSIS — I10 Essential (primary) hypertension: Secondary | ICD-10-CM | POA: Diagnosis not present

## 2020-01-07 DIAGNOSIS — J9601 Acute respiratory failure with hypoxia: Secondary | ICD-10-CM | POA: Diagnosis not present

## 2020-01-07 DIAGNOSIS — I674 Hypertensive encephalopathy: Secondary | ICD-10-CM | POA: Diagnosis not present

## 2020-01-07 DIAGNOSIS — R2689 Other abnormalities of gait and mobility: Secondary | ICD-10-CM | POA: Diagnosis not present

## 2020-01-07 DIAGNOSIS — M6281 Muscle weakness (generalized): Secondary | ICD-10-CM | POA: Diagnosis not present

## 2020-01-07 DIAGNOSIS — R4182 Altered mental status, unspecified: Secondary | ICD-10-CM | POA: Diagnosis not present

## 2020-01-07 DIAGNOSIS — L89001 Pressure ulcer of unspecified elbow, stage 1: Secondary | ICD-10-CM | POA: Diagnosis not present

## 2020-01-07 DIAGNOSIS — E119 Type 2 diabetes mellitus without complications: Secondary | ICD-10-CM | POA: Diagnosis not present

## 2020-01-07 DIAGNOSIS — E118 Type 2 diabetes mellitus with unspecified complications: Secondary | ICD-10-CM | POA: Diagnosis not present

## 2020-01-07 DIAGNOSIS — E038 Other specified hypothyroidism: Secondary | ICD-10-CM | POA: Diagnosis not present

## 2020-01-07 DIAGNOSIS — D473 Essential (hemorrhagic) thrombocythemia: Secondary | ICD-10-CM | POA: Diagnosis not present

## 2020-01-07 DIAGNOSIS — F0280 Dementia in other diseases classified elsewhere without behavioral disturbance: Secondary | ICD-10-CM | POA: Diagnosis not present

## 2020-01-07 DIAGNOSIS — E039 Hypothyroidism, unspecified: Secondary | ICD-10-CM | POA: Diagnosis not present

## 2020-01-07 DIAGNOSIS — R41841 Cognitive communication deficit: Secondary | ICD-10-CM | POA: Diagnosis not present

## 2020-01-07 HISTORY — DX: Type 2 diabetes mellitus with other specified complication: E11.69

## 2020-01-07 HISTORY — DX: Type 2 diabetes mellitus with other specified complication: E66.9

## 2020-01-07 MED ORDER — ASPIRIN 81 MG PO TBEC: 81.0000 mg | DELAYED_RELEASE_TABLET | Freq: Every day | ORAL | 0 refills | Status: DC

## 2020-01-07 MED ORDER — ATORVASTATIN CALCIUM 20 MG PO TABS: 20.0000 mg | ORAL_TABLET | Freq: Every day | ORAL | 0 refills | Status: DC

## 2020-01-07 NOTE — Discharge Instructions (Signed)
COVID-19 COVID-19 is a respiratory infection that is caused by a virus called severe acute respiratory syndrome coronavirus 2 (SARS-CoV-2). The disease is also known as coronavirus disease or novel coronavirus. In some people, the virus may not cause any symptoms. In others, it may cause a serious infection. The infection can get worse quickly and can lead to complications, such as:  Pneumonia, or infection of the lungs.  Acute respiratory distress syndrome or ARDS. This is a condition in which fluid build-up in the lungs prevents the lungs from filling with air and passing oxygen into the blood.  Acute respiratory failure. This is a condition in which there is not enough oxygen passing from the lungs to the body or when carbon dioxide is not passing from the lungs out of the body.  Sepsis or septic shock. This is a serious bodily reaction to an infection.  Blood clotting problems.  Secondary infections due to bacteria or fungus.  Organ failure. This is when your body's organs stop working. The virus that causes COVID-19 is contagious. This means that it can spread from person to person through droplets from coughs and sneezes (respiratory secretions). What are the causes? This illness is caused by a virus. You may catch the virus by:  Breathing in droplets from an infected person. Droplets can be spread by a person breathing, speaking, singing, coughing, or sneezing.  Touching something, like a table or a doorknob, that was exposed to the virus (contaminated) and then touching your mouth, nose, or eyes. What increases the risk? Risk for infection You are more likely to be infected with this virus if you:  Are within 6 feet (2 meters) of a person with COVID-19.  Provide care for or live with a person who is infected with COVID-19.  Spend time in crowded indoor spaces or live in shared housing. Risk for serious illness You are more likely to become seriously ill from the virus if you:   Are 50 years of age or older. The higher your age, the more you are at risk for serious illness.  Live in a nursing home or long-term care facility.  Have cancer.  Have a long-term (chronic) disease such as: ? Chronic lung disease, including chronic obstructive pulmonary disease or asthma. ? A long-term disease that lowers your body's ability to fight infection (immunocompromised). ? Heart disease, including heart failure, a condition in which the arteries that lead to the heart become narrow or blocked (coronary artery disease), a disease which makes the heart muscle thick, weak, or stiff (cardiomyopathy). ? Diabetes. ? Chronic kidney disease. ? Sickle cell disease, a condition in which red blood cells have an abnormal "sickle" shape. ? Liver disease.  Are obese. What are the signs or symptoms? Symptoms of this condition can range from mild to severe. Symptoms may appear any time from 2 to 14 days after being exposed to the virus. They include:  A fever or chills.  A cough.  Difficulty breathing.  Headaches, body aches, or muscle aches.  Runny or stuffy (congested) nose.  A sore throat.  New loss of taste or smell. Some people may also have stomach problems, such as nausea, vomiting, or diarrhea. Other people may not have any symptoms of COVID-19. How is this diagnosed? This condition may be diagnosed based on:  Your signs and symptoms, especially if: ? You live in an area with a COVID-19 outbreak. ? You recently traveled to or from an area where the virus is common. ? You   provide care for or live with a person who was diagnosed with COVID-19. ? You were exposed to a person who was diagnosed with COVID-19.  A physical exam.  Lab tests, which may include: ? Taking a sample of fluid from the back of your nose and throat (nasopharyngeal fluid), your nose, or your throat using a swab. ? A sample of mucus from your lungs (sputum). ? Blood tests.  Imaging tests, which  may include, X-rays, CT scan, or ultrasound. How is this treated? At present, there is no medicine to treat COVID-19. Medicines that treat other diseases are being used on a trial basis to see if they are effective against COVID-19. Your health care provider will talk with you about ways to treat your symptoms. For most people, the infection is mild and can be managed at home with rest, fluids, and over-the-counter medicines. Treatment for a serious infection usually takes places in a hospital intensive care unit (ICU). It may include one or more of the following treatments. These treatments are given until your symptoms improve.  Receiving fluids and medicines through an IV.  Supplemental oxygen. Extra oxygen is given through a tube in the nose, a face mask, or a hood.  Positioning you to lie on your stomach (prone position). This makes it easier for oxygen to get into the lungs.  Continuous positive airway pressure (CPAP) or bi-level positive airway pressure (BPAP) machine. This treatment uses mild air pressure to keep the airways open. A tube that is connected to a motor delivers oxygen to the body.  Ventilator. This treatment moves air into and out of the lungs by using a tube that is placed in your windpipe.  Tracheostomy. This is a procedure to create a hole in the neck so that a breathing tube can be inserted.  Extracorporeal membrane oxygenation (ECMO). This procedure gives the lungs a chance to recover by taking over the functions of the heart and lungs. It supplies oxygen to the body and removes carbon dioxide. Follow these instructions at home: Lifestyle  If you are sick, stay home except to get medical care. Your health care provider will tell you how long to stay home. Call your health care provider before you go for medical care.  Rest at home as told by your health care provider.  Do not use any products that contain nicotine or tobacco, such as cigarettes, e-cigarettes, and  chewing tobacco. If you need help quitting, ask your health care provider.  Return to your normal activities as told by your health care provider. Ask your health care provider what activities are safe for you. General instructions  Take over-the-counter and prescription medicines only as told by your health care provider.  Drink enough fluid to keep your urine pale yellow.  Keep all follow-up visits as told by your health care provider. This is important. How is this prevented?  There is no vaccine to help prevent COVID-19 infection. However, there are steps you can take to protect yourself and others from this virus. To protect yourself:   Do not travel to areas where COVID-19 is a risk. The areas where COVID-19 is reported change often. To identify high-risk areas and travel restrictions, check the CDC travel website: wwwnc.cdc.gov/travel/notices  If you live in, or must travel to, an area where COVID-19 is a risk, take precautions to avoid infection. ? Stay away from people who are sick. ? Wash your hands often with soap and water for 20 seconds. If soap and water   are not available, use an alcohol-based hand sanitizer. ? Avoid touching your mouth, face, eyes, or nose. ? Avoid going out in public, follow guidance from your state and local health authorities. ? If you must go out in public, wear a cloth face covering or face mask. Make sure your mask covers your nose and mouth. ? Avoid crowded indoor spaces. Stay at least 6 feet (2 meters) away from others. ? Disinfect objects and surfaces that are frequently touched every day. This may include:  Counters and tables.  Doorknobs and light switches.  Sinks and faucets.  Electronics, such as phones, remote controls, keyboards, computers, and tablets. To protect others: If you have symptoms of COVID-19, take steps to prevent the virus from spreading to others.  If you think you have a COVID-19 infection, contact your health care  provider right away. Tell your health care team that you think you may have a COVID-19 infection.  Stay home. Leave your house only to seek medical care. Do not use public transport.  Do not travel while you are sick.  Wash your hands often with soap and water for 20 seconds. If soap and water are not available, use alcohol-based hand sanitizer.  Stay away from other members of your household. Let healthy household members care for children and pets, if possible. If you have to care for children or pets, wash your hands often and wear a mask. If possible, stay in your own room, separate from others. Use a different bathroom.  Make sure that all people in your household wash their hands well and often.  Cough or sneeze into a tissue or your sleeve or elbow. Do not cough or sneeze into your hand or into the air.  Wear a cloth face covering or face mask. Make sure your mask covers your nose and mouth. Where to find more information  Centers for Disease Control and Prevention: www.cdc.gov/coronavirus/2019-ncov/index.html  World Health Organization: www.who.int/health-topics/coronavirus Contact a health care provider if:  You live in or have traveled to an area where COVID-19 is a risk and you have symptoms of the infection.  You have had contact with someone who has COVID-19 and you have symptoms of the infection. Get help right away if:  You have trouble breathing.  You have pain or pressure in your chest.  You have confusion.  You have bluish lips and fingernails.  You have difficulty waking from sleep.  You have symptoms that get worse. These symptoms may represent a serious problem that is an emergency. Do not wait to see if the symptoms will go away. Get medical help right away. Call your local emergency services (911 in the [place] Do not drive yourself to the hospital. Let the emergency medical personnel know if you think you have COVID-19. Summary  COVID-19 is a  respiratory infection that is caused by a virus. It is also known as coronavirus disease or novel coronavirus. It can cause serious infections, such as pneumonia, acute respiratory distress syndrome, acute respiratory failure, or sepsis.  The virus that causes COVID-19 is contagious. This means that it can spread from person to person through droplets from breathing, speaking, singing, coughing, or sneezing.  You are more likely to develop a serious illness if you are 50 years of age or older, have a weak immune system, live in a nursing home, or have chronic disease.  There is no medicine to treat COVID-19. Your health care provider will talk with you about ways to treat your symptoms.    Take steps to protect yourself and others from infection. Wash your hands often and disinfect objects and surfaces that are frequently touched every day. Stay away from people who are sick and wear a mask if you are sick. This information is not intended to replace advice given to you by your health care provider. Make sure you discuss any questions you have with your health care provider. Document Revised: 10/09/2019 Document Reviewed: 01/15/2019 Elsevier Patient Education  2020 [employer] Inc.  

## 2020-01-07 NOTE — Care Management Important Message (Signed)
Important Message  Patient Details  Name: Douglas Grant MRN: [id number] Date of Birth: 07/14/1930   Medicare Important Message Given:  Yes - Important Message mailed due to current National Emergency  Verbal consent obtained due to current National Emergency  Relationship to patient: Child Contact Name: Randye Lobo Call Date: 01/07/20  Time: 1036 Phone: [phone] Outcome: Spoke with contact Important Message mailed to: Patient address on file(Temporary address listed)    Delorse Lek 01/07/2020, 10:37 AM

## 2020-01-07 NOTE — TOC Transition Note (Signed)
Transition of Care [facility]) - CM/SW Discharge Note   Patient Details  Name: Douglas Grant MRN: [id number] Date of Birth: [birth year]  [facility]) CM/SW Contact:  Shade Flood, LCSW Phone Number: 01/07/2020, 10:10 AM   Clinical Narrative:     [facility] pt today as planned. DC clinical will be sent electronically.  Pt going to [facility]. Phone number for report is [phone].  EMS forms printed to the unit. Updated pt's RN. Will call EMS when pt ready.  There are no other TOC needs for dc.  Final next level of care: Skilled Nursing Facility Barriers to Discharge: Barriers Resolved   Patient Goals and CMS Choice Patient states their goals for this hospitalization and ongoing recovery are:: baseline confusion CMS Medicare.gov Compare Post Acute Care list provided to:: Patient Represenative (must comment)([facility]) Choice offered to / list presented to : Adult Children  Discharge Placement PASRR number recieved: 01/03/20            Patient chooses bed at: [facility] Patient to be transferred to facility by: [place] Name of family member notified: [name] Patient and family notified of of transfer: 01/07/20  Discharge Plan and Services In-house Referral: Clinical Social Work   Post Acute Care Choice: [place]                               Social Determinants of Health (SDOH) Interventions     Readmission Risk Interventions No flowsheet data found.

## 2020-01-07 NOTE — Discharge Summary (Addendum)
Physician Discharge Summary  Douglas Grant GXQ:[id number] DOB: 27-Aug-1930 DOA: 12/29/2019  PCP: Shelda Pal, DO  Admit date: 12/29/2019 Discharge date: 01/07/2020  Admitted From: Home  Disposition:   SNF   Recommendations for Outpatient Follow-up and new medication changes:  1. Follow up with Dr. Nani Ravens in 2 weeks.  2. Continue quarantine for 2 more weeks, maintain physical distancing and use a mask in public.  3. Added low dose asa and statin for history of CVA.  4. I spoke with patient's daughter over the phone and all questions were addressed.  Home Health: na  Equipment/Devices:  Na   Discharge Condition: stable  CODE STATUS: full  Diet recommendation: heart healthy and diabetic prudent.    Brief/Interim Summary:  86 year old male who presented with dyspnea.  He does have significant past medical history of dementia, Hx of CVA, hypertension, diastolic heart failure, and type 2 diabetes mellitus.  Patient had worsening dyspnea for about 3 weeks, associated with lower extremity edema and orthopnea. He was evaluated by his primary care provider and his oximetry was found mid 80s.  On his initial physical examination his blood pressure was 191/96, heart rate 95, respiratory rate 18, temperature 99.5, oximetry 88%.  His lungs had decreased breath sounds bilaterally without wheezing or rales, heart S1-S2 present, rhythmic, abdomen soft, no significant lower extremity edema. Sodium 138, potassium 4.6, chloride 95, bicarb 37, glucose 115, BUN 20, creatinine 1.13, white count 9.4, hemoglobin 12.6, hematocrit 41.3, platelets 457. SARS COVID-19 positive.  Chest radiograph with bilateral interstitial infiltrates, predominantly right upper lobe.  His EKG had 58 bpm, normal axis, normal intervals, sinus rhythm, with a premature atrial complex, no ST segment or T wave changes  Patient was admitted to the hospital working diagnosis of acute hypoxic respiratory failure due to SARS COVID-19  viral pneumonia.  1.  Acute hypoxic respiratory failure due to SARS COVID-19 viral pneumonia.  Patient was admitted to the medical ward, he received supplemental oxygen per nasal cannula and medical therapy with remdesivir and systemic corticosteroids.  He responded well to medical therapy with improvement of his symptoms and inflammatory markers.  His oximetry at discharge is 92 to 95% on room air.   Patient was evaluated by physical therapy, with recommendations to continue therapy at a skilled nursing facility.   2.  Uncontrolled type 2 diabetes mellitus (hemoglobin X4G 7.0), complicated with steroid-induced hyperglycemia.  Dyslipidemia. patient received insulin per sliding scale for glucose coverage and monitoring.  Added statin.  3.  Dementia with history of CVA.  No agitation during his hospitalization, continue donepezil, memantine and Seroquel.  For his history of CVA a low-dose aspirin and statin was added to his medical regimen.  4.  Hypothyroidism.  Continue levothyroxine  5.  Stage I bilateral buttock pressure ulcers.  Present on admission, continue local wound care.  6.  Chronic diastolic heart failure/hypertension.  No signs of acute exacerbation.  Continue blood pressure control.  Discharge Diagnoses:  Principal Problem:   Pneumonia due to COVID-19 virus Active Problems:   Hypothyroidism   Essential hypertension   Stroke [facility])   CHF (congestive heart failure) (HCC)   Acute respiratory failure with hypoxia (HCC)   COVID-19 virus infection   Chronic diastolic CHF (congestive heart failure) (HCC)   HLD (hyperlipidemia)   Pressure injury of skin   Type 2 diabetes mellitus with hyperlipidemia ([place])    Discharge Instructions   Allergies as of 01/07/2020      Reactions   Atenolol Shortness  Of Breath   Clonidine Hydrochloride Other (See Comments)   Dry mouth   Prednisone Other (See Comments)   Hair loss   Verapamil Other (See Comments)   hallucinations       Medication List    TAKE these medications   alfuzosin 10 MG 24 hr tablet Commonly known as: UROXATRAL Take 1 tablet (10 mg total) by mouth daily. What changed: when to take this   aspirin 81 MG EC tablet Take 1 tablet (81 mg total) by mouth daily.   atorvastatin 20 MG tablet Commonly known as: LIPITOR Take 1 tablet (20 mg total) by mouth daily at 6 PM.   B-12 1000 MCG Tabs Take 1,000 mcg by mouth daily.   cetirizine 10 MG tablet Commonly known as: ZYRTEC Take 1 tablet (10 mg total) by mouth daily.   donepezil 10 MG tablet Commonly known as: ARICEPT TAKE 1 TABLET AT BEDTIME   EQ Slow-Release Iron 45 MG Tbcr Generic drug: Ferrous Sulfate Dried Take 45 mg by mouth 2 (two) times daily.   finasteride 5 MG tablet Commonly known as: PROSCAR TAKE 1 TABLET DAILY   levothyroxine 75 MCG tablet Commonly known as: SYNTHROID TAKE 1 TABLET DAILY What changed: when to take this   memantine 5 MG tablet Commonly known as: NAMENDA Take 1 tablet (5 mg total) by mouth 2 (two) times daily. 1 tab twice daily.   omeprazole 40 MG capsule Commonly known as: PRILOSEC TAKE 1 CAPSULE DAILY   propranolol ER 60 MG 24 hr capsule Commonly known as: INDERAL LA TAKE 1 CAPSULE DAILY What changed: when to take this   QUEtiapine 100 MG tablet Commonly known as: SEROQUEL TAKE 1 TABLET AT BEDTIME   venlafaxine XR 75 MG 24 hr capsule Commonly known as: EFFEXOR-XR TAKE 1 CAPSULE DAILY WITH BREAKFAST What changed: See the new instructions.      Follow-up Information    Shelda Pal, DO Follow up in 2 week(s).   Specialty: Family Medicine Contact information: [address] [place] [address] [phone]          Allergies  Allergen Reactions  . Atenolol Shortness Of Breath  . Clonidine Hydrochloride Other (See Comments)    Dry mouth  . Prednisone Other (See Comments)    Hair loss  . Verapamil Other (See Comments)    hallucinations     Consultations:     Procedures/Studies: Portable chest 1 View  Result Date: 12/30/2019 CLINICAL DATA:  Shortness of breath. COVID-19. EXAM: PORTABLE CHEST 1 VIEW COMPARISON:  12/29/2019 FINDINGS: Small hazy peripheral infiltrates are again noted in both lungs consistent with viral pneumonia. Heart size and vascularity are normal. No significant change since the prior study considering differences in patient position. IMPRESSION: No change in the appearance of the chest since the prior study. Patchy peripheral hazy bilateral infiltrates. Electronically Signed   By: Lorriane Shire M.D.   On: 12/30/2019 15:32   DG Chest Portable 1 View  Result Date: 12/29/2019 CLINICAL DATA:  Shortness of breath, COVID-19 negativity EXAM: PORTABLE CHEST 1 VIEW COMPARISON:  07/26/2017 FINDINGS: Cardiac shadow is at the upper limits of normal in size. Aortic calcifications are noted. Patchy infiltrates are noted throughout the right lung as well as in the left lung base consistent with a multifocal pneumonia. No bony abnormality is seen. No sizable effusion is noted. IMPRESSION: Bilateral infiltrates right greater than left. Electronically Signed   By: Inez Catalina M.D.   On: 12/29/2019 17:46  Procedures:   Subjective: Patient is feeling well, no nausea or vomiting, dyspnea continue to improve but continue to be very weak and deconditioned.   Discharge Exam: Vitals:   01/07/20 0420 01/07/20 0728  BP: (!) 122/42 (!) 150/61  Pulse: 65 60  Resp: 18 17  Temp: 97.7 F (36.5 C) 98.3 F (36.8 C)  SpO2: 92% 95%   Vitals:   01/06/20 1955 01/07/20 0420 01/07/20 0422 01/07/20 0728  BP: (!) 138/49 (!) 122/42  (!) 150/61  Pulse: (!) 54 65  60  Resp: 18 18  17   Temp: 98.5 F (36.9 C) 97.7 F (36.5 C)  98.3 F (36.8 C)  TempSrc: Oral Oral  Oral  SpO2: 96% 92%  95%  Weight:   83.9 kg   Height:        General: Not in pain or dyspnea.  Neurology: Awake and alert, non focal  E ENT: mild pallor, no  icterus, oral mucosa moist Cardiovascular: No JVD. S1-S2 present, rhythmic. No lower extremity edema. Pulmonary: positive breath sounds bilaterally. Gastrointestinal. Abdomen with no organomegaly, non tender, no rebound or guarding Skin. No rashes Musculoskeletal: no joint deformities   The results of significant diagnostics from this hospitalization (including imaging, microbiology, ancillary and laboratory) are listed below for reference.     Microbiology: Recent Results (from the past 240 hour(s))  SARS Coronavirus 2 Ag (30 min TAT) - Nasal Swab (BD Veritor Kit)     Status: None   Collection Time: 12/29/19  4:13 PM   Specimen: Nasal Swab (BD Veritor Kit)  Result Value Ref Range Status   SARS Coronavirus 2 Ag NEGATIVE NEGATIVE Final    Comment: (NOTE) SARS-CoV-2 antigen NOT DETECTED.  Negative results are presumptive.  Negative results do not preclude SARS-CoV-2 infection and should not be used as the sole basis for treatment or other patient management decisions, including infection  control decisions, particularly in the presence of clinical signs and  symptoms consistent with COVID-19, or in those who have been in contact with the virus.  Negative results must be combined with clinical observations, patient history, and epidemiological information. The expected result is Negative. Fact Sheet for Patients: PodPark.tn Fact Sheet for Healthcare Providers: GiftContent.is This test is not yet approved or cleared by the [place] FDA and  has been authorized for detection and/or diagnosis of SARS-CoV-2 by FDA under an Emergency Use Authorization (EUA).  This EUA will remain in effect (meaning this test can be used) for the duration of  the COVID-19 de claration under Section 564(b)(1) of the Act, 21 U.S.C. section 360bbb-3(b)(1), unless the authorization is terminated or revoked sooner. Performed at [facility], [address] [place], [place] [address]   SARS Coronavirus 2 by RT PCR (hospital order, performed in [facility] hospital lab) Nasopharyngeal Nasopharyngeal Swab     Status: Abnormal   Collection Time: 12/29/19  8:30 PM   Specimen: Nasopharyngeal Swab  Result Value Ref Range Status   SARS Coronavirus 2 POSITIVE (A) NEGATIVE Final    Comment: RESULT CALLED TO, READ BACK BY AND VERIFIED WITH: KELLIE NEAL RN AT 2311 ON 12/29/19 BY I.SUGUT (NOTE) SARS-CoV-2 target nucleic acids are DETECTED SARS-CoV-2 RNA is generally detectable in upper respiratory specimens  during the acute phase of infection.  Positive results are indicative  of the presence of the identified virus, but do not rule out bacterial infection or co-infection with other pathogens not detected by the test.  Clinical correlation with patient history  and  other diagnostic information is necessary to determine patient infection status.  The expected result is negative. Fact Sheet for Patients:   StrictlyIdeas.no  Fact Sheet for Healthcare Providers:   BankingDealers.co.za   This test is not yet approved or cleared by the [place] FDA and  has been authorized for detection and/or diagnosis of SARS-CoV-2 by FDA under an Emergency Use Authorization (EUA).  This EUA will remain in effect (meaning this t est can be used) for the duration of  the COVID-19 declaration under Section 564(b)(1) of the Act, 21 U.S.C. section 360-bbb-3(b)(1), unless the authorization is terminated or revoked sooner. Performed at [facility], [address] [place], [place] [address]   Blood Culture (routine x 2)     Status: Abnormal   Collection Time: 12/29/19 11:59 PM   Specimen: BLOOD  Result Value Ref Range Status   Specimen Description   Final    BLOOD LEFT ANTECUBITAL Performed at [facility], [address] [place], [place] [address]    Special Requests    Final    BOTTLES DRAWN AEROBIC AND ANAEROBIC Blood Culture adequate volume Performed at [facility], [address] [place], [place] [address]    Culture  Setup Time GRAM POSITIVE COCCI AEROBIC BOTTLE ONLY   Final   Culture (A)  Final    STAPHYLOCOCCUS CAPITIS THE SIGNIFICANCE OF ISOLATING THIS ORGANISM FROM A SINGLE SET OF BLOOD CULTURES WHEN MULTIPLE SETS ARE DRAWN IS UNCERTAIN. PLEASE NOTIFY THE MICROBIOLOGY DEPARTMENT WITHIN ONE WEEK IF SPECIATION AND SENSITIVITIES ARE REQUIRED. Performed at [facility], [place] [address] [place], [place] [address]    Report Status 01/03/2020 FINAL  Final  Blood Culture ID Panel (Reflexed)     Status: Abnormal   Collection Time: 12/29/19 11:59 PM  Result Value Ref Range Status   Enterococcus species NOT DETECTED NOT DETECTED Final   Listeria monocytogenes NOT DETECTED NOT DETECTED Final   Staphylococcus species DETECTED (A) NOT DETECTED Final    Comment: Methicillin (oxacillin) susceptible coagulase negative staphylococcus. Possible blood culture contaminant (unless isolated from more than one blood culture draw or clinical case suggests pathogenicity). No antibiotic treatment is indicated for blood  culture contaminants. CRITICAL VALUE NOTED.  VALUE IS CONSISTENT WITH PREVIOUSLY REPORTED AND CALLED VALUE. SEE [id number]    Staphylococcus aureus (BCID) NOT DETECTED NOT DETECTED Final   Methicillin resistance NOT DETECTED NOT DETECTED Final   Streptococcus species NOT DETECTED NOT DETECTED Final   Streptococcus agalactiae NOT DETECTED NOT DETECTED Final   Streptococcus pneumoniae NOT DETECTED NOT DETECTED Final   Streptococcus pyogenes NOT DETECTED NOT DETECTED Final   Acinetobacter baumannii NOT DETECTED NOT DETECTED Final   Enterobacteriaceae species NOT DETECTED NOT DETECTED Final   Enterobacter cloacae complex NOT DETECTED NOT DETECTED Final   Escherichia coli NOT DETECTED NOT DETECTED Final   Klebsiella oxytoca NOT DETECTED NOT  DETECTED Final   Klebsiella pneumoniae NOT DETECTED NOT DETECTED Final   Proteus species NOT DETECTED NOT DETECTED Final   Serratia marcescens NOT DETECTED NOT DETECTED Final   Haemophilus influenzae NOT DETECTED NOT DETECTED Final   Neisseria meningitidis NOT DETECTED NOT DETECTED Final   Pseudomonas aeruginosa NOT DETECTED NOT DETECTED Final   Candida albicans NOT DETECTED NOT DETECTED Final   Candida glabrata NOT DETECTED NOT DETECTED Final   Candida krusei NOT DETECTED NOT DETECTED Final   Candida parapsilosis NOT DETECTED NOT DETECTED Final   Candida tropicalis NOT  DETECTED NOT DETECTED Final    Comment: Performed at [facility], [place] [address], [place], [place] [address]  Blood Culture (routine x 2)     Status: Abnormal   Collection Time: 12/30/19 12:05 AM   Specimen: BLOOD LEFT WRIST  Result Value Ref Range Status   Specimen Description   Final    BLOOD LEFT WRIST Performed at [facility], [address] [place], [place] [address]    Special Requests   Final    BOTTLES DRAWN AEROBIC AND ANAEROBIC Blood Culture adequate volume Performed at [facility], [address] [place], [place] [address]    Culture  Setup Time   Final    GRAM POSITIVE COCCI IN CLUSTERS IN BOTH AEROBIC AND ANAEROBIC BOTTLES CRITICAL RESULT CALLED TO, READ BACK BY AND VERIFIED WITH: K AMEND PHARMD 8309 12/30/19 A BROWNING    Culture (A)  Final    STAPHYLOCOCCUS EPIDERMIDIS THE SIGNIFICANCE OF ISOLATING THIS ORGANISM FROM A SINGLE SET OF BLOOD CULTURES WHEN MULTIPLE SETS ARE DRAWN IS UNCERTAIN. PLEASE NOTIFY THE MICROBIOLOGY DEPARTMENT WITHIN ONE WEEK IF SPECIATION AND SENSITIVITIES ARE REQUIRED. Performed at [facility], [place] [address] [place], [place] [address]    Report Status 01/03/2020 FINAL  Final  Respiratory Panel by PCR     Status: None   Collection Time: 12/30/19 12:22 PM   Specimen: Nasopharyngeal Swab; Respiratory  Result Value Ref Range Status    Adenovirus NOT DETECTED NOT DETECTED Final   Coronavirus 229E NOT DETECTED NOT DETECTED Final    Comment: (NOTE) The Coronavirus on the Respiratory Panel, DOES NOT test for the novel  Coronavirus (2019 nCoV)    Coronavirus HKU1 NOT DETECTED NOT DETECTED Final   Coronavirus NL63 NOT DETECTED NOT DETECTED Final   Coronavirus OC43 NOT DETECTED NOT DETECTED Final   Metapneumovirus NOT DETECTED NOT DETECTED Final   Rhinovirus / Enterovirus NOT DETECTED NOT DETECTED Final   Influenza A NOT DETECTED NOT DETECTED Final   Influenza B NOT DETECTED NOT DETECTED Final   Parainfluenza Virus 1 NOT DETECTED NOT DETECTED Final   Parainfluenza Virus 2 NOT DETECTED NOT DETECTED Final   Parainfluenza Virus 3 NOT DETECTED NOT DETECTED Final   Parainfluenza Virus 4 NOT DETECTED NOT DETECTED Final   Respiratory Syncytial Virus NOT DETECTED NOT DETECTED Final   Bordetella pertussis NOT DETECTED NOT DETECTED Final   Chlamydophila pneumoniae NOT DETECTED NOT DETECTED Final   Mycoplasma pneumoniae NOT DETECTED NOT DETECTED Final    Comment: Performed at [facility], [place]. [address] [place], [place] [address]  SARS CORONAVIRUS 2 (TAT 6-24 HRS) Nasopharyngeal Nasopharyngeal Swab     Status: Abnormal   Collection Time: 12/30/19  2:49 PM   Specimen: Nasopharyngeal Swab  Result Value Ref Range Status   SARS Coronavirus 2 POSITIVE (A) NEGATIVE Final    Comment: RESULT CALLED TO, READ BACK BY AND VERIFIED WITH: M.KENNEY,RN 0017 12/31/19 G.MCADOO (NOTE) SARS-CoV-2 target nucleic acids are DETECTED. The SARS-CoV-2 RNA is generally detectable in upper and lower respiratory specimens during the acute phase of infection. Positive results are indicative of the presence of SARS-CoV-2 RNA. Clinical correlation with patient history and other diagnostic information is  necessary to determine patient infection status. Positive results do not rule out bacterial infection or co-infection with other viruses.  The  expected result is Negative. Fact Sheet for Patients: SugarRoll.be Fact Sheet for Healthcare Providers: https://www.woods-mathews.com/ This test is not yet approved or  cleared by the [place] and  has been authorized for detection and/or diagnosis of SARS-CoV-2 by FDA under an Emergency Use Authorization (EUA). This EUA will remain  in effect (meaning this test can be used) for the du ration of the COVID-19 declaration under Section 564(b)(1) of the Act, 21 U.S.C. section 360bbb-3(b)(1), unless the authorization is terminated or revoked sooner. Performed at [facility], [place] [address], [place], [place] [address]   Culture, blood (routine x 2)     Status: None   Collection Time: 12/31/19  2:30 AM   Specimen: BLOOD LEFT WRIST  Result Value Ref Range Status   Specimen Description BLOOD LEFT WRIST  Final   Special Requests   Final    BOTTLES DRAWN AEROBIC ONLY Blood Culture adequate volume   Culture   Final    NO GROWTH 5 DAYS Performed at [facility], [place] [address], [place], [place] [address]    Report Status 01/05/2020 FINAL  Final  Culture, blood (routine x 2)     Status: Abnormal   Collection Time: 12/31/19  2:35 AM   Specimen: BLOOD  Result Value Ref Range Status   Specimen Description BLOOD RIGHT ANTECUBITAL  Final   Special Requests   Final    BOTTLES DRAWN AEROBIC ONLY Blood Culture adequate volume   Culture  Setup Time   Final    AEROBIC BOTTLE ONLY GRAM POSITIVE COCCI CRITICAL RESULT CALLED TO, READ BACK BY AND VERIFIED WITH: KAREN AMEND [place] @ 0076 ON 01/01/20 BY ROBINSON Z.     Culture (A)  Final    STAPHYLOCOCCUS HOMINIS THE SIGNIFICANCE OF ISOLATING THIS ORGANISM FROM A SINGLE SET OF BLOOD CULTURES WHEN MULTIPLE SETS ARE DRAWN IS UNCERTAIN. PLEASE NOTIFY THE MICROBIOLOGY DEPARTMENT WITHIN ONE WEEK IF SPECIATION AND SENSITIVITIES ARE REQUIRED. Performed at [facility], [place] [address]  [place], [place] [address]    Report Status 01/03/2020 FINAL  Final     Labs: BNP (last 3 results) Recent Labs    12/29/19 1546 12/30/19 1513  BNP 349.0* 354.5*   Basic Metabolic Panel: Recent Labs  Lab 01/01/20 0126 01/02/20 0358 01/03/20 0416 01/04/20 0536 01/06/20 0408  NA 137 137 136 136  --   K 4.1 4.6 4.3 4.3  --   CL 92* 91* 92* 93*  --   CO2 35* 37* 37* 34*  --   GLUCOSE 107* 113* 112* 133*  --   BUN 33* 31* 34* 43*  --   CREATININE 1.18 1.07 1.12 1.39* 1.27*  CALCIUM 8.3* 8.6* 8.2* 8.5*  --   MG 1.8 1.9 1.9 1.8  --   PHOS 4.1 3.8 4.4 3.9  --    Liver Function Tests: Recent Labs  Lab 01/01/20 0126 01/02/20 0358 01/03/20 0416 01/04/20 0536  AST 18 20 16  14*  ALT 16 18 18 18   ALKPHOS 42 39 37* 32*  BILITOT 0.2* 0.6 0.5 0.8  PROT 6.1* 6.3* 5.8* 5.8*  ALBUMIN 2.5* 2.7* 2.5* 2.5*   No results for input(s): LIPASE, AMYLASE in the last 168 hours. No results for input(s): AMMONIA in the last 168 hours. CBC: Recent Labs  Lab 01/01/20 0126 01/02/20 0358 01/03/20 0416 01/04/20 0536  WBC 9.7 9.8 7.9 10.1  NEUTROABS 7.4 7.3 5.7 7.0  HGB 11.6* 12.2* 11.2* 11.9*  HCT 38.0* 39.1 36.2* 38.1*  MCV 94.8 95.6 93.5 92.5  PLT 437* 457* 399 436*   Cardiac Enzymes: No results for input(s): CKTOTAL,  CKMB, CKMBINDEX, TROPONINI in the last 168 hours. BNP: Invalid input(s): POCBNP CBG: No results for input(s): GLUCAP in the last 168 hours. D-Dimer No results for input(s): DDIMER in the last 72 hours. Hgb A1c No results for input(s): HGBA1C in the last 72 hours. Lipid Profile No results for input(s): CHOL, HDL, LDLCALC, TRIG, CHOLHDL, LDLDIRECT in the last 72 hours. Thyroid function studies No results for input(s): TSH, T4TOTAL, T3FREE, THYROIDAB in the last 72 hours.  Invalid input(s): FREET3 Anemia work up No results for input(s): VITAMINB12, FOLATE, FERRITIN, TIBC, IRON, RETICCTPCT in the last 72 hours. Urinalysis    Component Value Date/Time   COLORURINE  YELLOW 08/31/2018 1342   APPEARANCEUR CLEAR 08/31/2018 1342   LABSPEC 1.025 08/31/2018 1342   PHURINE 5.5 08/31/2018 1342   GLUCOSEU NEGATIVE 08/31/2018 1342   HGBUR SMALL (A) 08/31/2018 1342   HGBUR negative 07/13/2008 1535   BILIRUBINUR negative 07/29/2019 1022   KETONESUR NEGATIVE 08/31/2018 1342   PROTEINUR Positive (A) 07/29/2019 1022   PROTEINUR 30 (A) 08/31/2018 1342   UROBILINOGEN 0.2 07/29/2019 1022   UROBILINOGEN 1.0 04/21/2011 0643   NITRITE negative 07/29/2019 1022   NITRITE NEGATIVE 08/31/2018 1342   LEUKOCYTESUR Negative 07/29/2019 1022   Sepsis Labs Invalid input(s): PROCALCITONIN,  WBC,  LACTICIDVEN Microbiology Recent Results (from the past 240 hour(s))  SARS Coronavirus 2 Ag (30 min TAT) - Nasal Swab (BD Veritor Kit)     Status: None   Collection Time: 12/29/19  4:13 PM   Specimen: Nasal Swab (BD Veritor Kit)  Result Value Ref Range Status   SARS Coronavirus 2 Ag NEGATIVE NEGATIVE Final    Comment: (NOTE) SARS-CoV-2 antigen NOT DETECTED.  Negative results are presumptive.  Negative results do not preclude SARS-CoV-2 infection and should not be used as the sole basis for treatment or other patient management decisions, including infection  control decisions, particularly in the presence of clinical signs and  symptoms consistent with COVID-19, or in those who have been in contact with the virus.  Negative results must be combined with clinical observations, patient history, and epidemiological information. The expected result is Negative. Fact Sheet for Patients: PodPark.tn Fact Sheet for Healthcare Providers: GiftContent.is This test is not yet approved or cleared by the [place] FDA and  has been authorized for detection and/or diagnosis of SARS-CoV-2 by FDA under an Emergency Use Authorization (EUA).  This EUA will remain in effect (meaning this test can be used) for the duration of  the  COVID-19 de claration under Section 564(b)(1) of the Act, 21 U.S.C. section 360bbb-3(b)(1), unless the authorization is terminated or revoked sooner. Performed at [facility], [address] [place], [place] [address]   SARS Coronavirus 2 by RT PCR (hospital order, performed in [facility] hospital lab) Nasopharyngeal Nasopharyngeal Swab     Status: Abnormal   Collection Time: 12/29/19  8:30 PM   Specimen: Nasopharyngeal Swab  Result Value Ref Range Status   SARS Coronavirus 2 POSITIVE (A) NEGATIVE Final    Comment: RESULT CALLED TO, READ BACK BY AND VERIFIED WITH: KELLIE NEAL RN AT 2311 ON 12/29/19 BY I.SUGUT (NOTE) SARS-CoV-2 target nucleic acids are DETECTED SARS-CoV-2 RNA is generally detectable in upper respiratory specimens  during the acute phase of infection.  Positive results are indicative  of the presence of the identified virus, but do not rule out bacterial infection or co-infection with other pathogens not detected by the test.  Clinical correlation with patient history and  other diagnostic  information is necessary to determine patient infection status.  The expected result is negative. Fact Sheet for Patients:   StrictlyIdeas.no  Fact Sheet for Healthcare Providers:   [email or web address]   This test is not yet approved or cleared by the [place] FDA and  has been authorized for detection and/or diagnosis of SARS-CoV-2 by FDA under an Emergency Use Authorization (EUA).  This EUA will remain in effect (meaning this t est can be used) for the duration of  the COVID-19 declaration under Section 564(b)(1) of the Act, 21 U.S.C. section 360-bbb-3(b)(1), unless the authorization is terminated or revoked sooner. Performed at [facility], [address] [place], [place] [address]   Blood Culture (routine x 2)     Status: Abnormal   Collection Time: 12/29/19 11:59 PM   Specimen: BLOOD  Result Value  Ref Range Status   Specimen Description   Final    BLOOD LEFT ANTECUBITAL Performed at [facility], [address] [place], [place] [address]    Special Requests   Final    BOTTLES DRAWN AEROBIC AND ANAEROBIC Blood Culture adequate volume Performed at [facility], [address] [place], [place] [address]    Culture  Setup Time GRAM POSITIVE COCCI AEROBIC BOTTLE ONLY   Final   Culture (A)  Final    STAPHYLOCOCCUS CAPITIS THE SIGNIFICANCE OF ISOLATING THIS ORGANISM FROM A SINGLE SET OF BLOOD CULTURES WHEN MULTIPLE SETS ARE DRAWN IS UNCERTAIN. PLEASE NOTIFY THE MICROBIOLOGY DEPARTMENT WITHIN ONE WEEK IF SPECIATION AND SENSITIVITIES ARE REQUIRED. Performed at [facility], [place] [address], [place], [place] [address]    Report Status 01/03/2020 FINAL  Final  Blood Culture ID Panel (Reflexed)     Status: Abnormal   Collection Time: 12/29/19 11:59 PM  Result Value Ref Range Status   Enterococcus species NOT DETECTED NOT DETECTED Final   Listeria monocytogenes NOT DETECTED NOT DETECTED Final   Staphylococcus species DETECTED (A) NOT DETECTED Final    Comment: Methicillin (oxacillin) susceptible coagulase negative staphylococcus. Possible blood culture contaminant (unless isolated from more than one blood culture draw or clinical case suggests pathogenicity). No antibiotic treatment is indicated for blood  culture contaminants. CRITICAL VALUE NOTED.  VALUE IS CONSISTENT WITH PREVIOUSLY REPORTED AND CALLED VALUE. SEE [id number]    Staphylococcus aureus (BCID) NOT DETECTED NOT DETECTED Final   Methicillin resistance NOT DETECTED NOT DETECTED Final   Streptococcus species NOT DETECTED NOT DETECTED Final   Streptococcus agalactiae NOT DETECTED NOT DETECTED Final   Streptococcus pneumoniae NOT DETECTED NOT DETECTED Final   Streptococcus pyogenes NOT DETECTED NOT DETECTED Final   Acinetobacter baumannii NOT DETECTED NOT DETECTED Final   Enterobacteriaceae species NOT  DETECTED NOT DETECTED Final   Enterobacter cloacae complex NOT DETECTED NOT DETECTED Final   Escherichia coli NOT DETECTED NOT DETECTED Final   Klebsiella oxytoca NOT DETECTED NOT DETECTED Final   Klebsiella pneumoniae NOT DETECTED NOT DETECTED Final   Proteus species NOT DETECTED NOT DETECTED Final   Serratia marcescens NOT DETECTED NOT DETECTED Final   Haemophilus influenzae NOT DETECTED NOT DETECTED Final   Neisseria meningitidis NOT DETECTED NOT DETECTED Final   Pseudomonas aeruginosa NOT DETECTED NOT DETECTED Final   Candida albicans NOT DETECTED NOT DETECTED Final   Candida glabrata NOT DETECTED NOT DETECTED Final   Candida krusei NOT DETECTED NOT DETECTED Final   Candida parapsilosis NOT DETECTED NOT DETECTED Final   Candida tropicalis NOT DETECTED NOT DETECTED Final  Comment: Performed at [facility], [place] [address] [place], [place] [address]  Blood Culture (routine x 2)     Status: Abnormal   Collection Time: 12/30/19 12:05 AM   Specimen: BLOOD LEFT WRIST  Result Value Ref Range Status   Specimen Description   Final    BLOOD LEFT WRIST Performed at [facility], [address] [place], [place] [address]    Special Requests   Final    BOTTLES DRAWN AEROBIC AND ANAEROBIC Blood Culture adequate volume Performed at [facility], [address] [place], [place] [address]    Culture  Setup Time   Final    GRAM POSITIVE COCCI IN CLUSTERS IN BOTH AEROBIC AND ANAEROBIC BOTTLES CRITICAL RESULT CALLED TO, READ BACK BY AND VERIFIED WITH: K AMEND PHARMD [id number] 12/30/19 A BROWNING    Culture (A)  Final    STAPHYLOCOCCUS EPIDERMIDIS THE SIGNIFICANCE OF ISOLATING THIS ORGANISM FROM A SINGLE SET OF BLOOD CULTURES WHEN MULTIPLE SETS ARE DRAWN IS UNCERTAIN. PLEASE NOTIFY THE MICROBIOLOGY DEPARTMENT WITHIN ONE WEEK IF SPECIATION AND SENSITIVITIES ARE REQUIRED. Performed at [facility], [place] [address] [place], [place] [address]    Report Status  01/03/2020 FINAL  Final  Respiratory Panel by PCR     Status: None   Collection Time: 12/30/19 12:22 PM   Specimen: Nasopharyngeal Swab; Respiratory  Result Value Ref Range Status   Adenovirus NOT DETECTED NOT DETECTED Final   Coronavirus 229E NOT DETECTED NOT DETECTED Final    Comment: (NOTE) The Coronavirus on the Respiratory Panel, DOES NOT test for the novel  Coronavirus (2019 nCoV)    Coronavirus HKU1 NOT DETECTED NOT DETECTED Final   Coronavirus NL63 NOT DETECTED NOT DETECTED Final   Coronavirus OC43 NOT DETECTED NOT DETECTED Final   Metapneumovirus NOT DETECTED NOT DETECTED Final   Rhinovirus / Enterovirus NOT DETECTED NOT DETECTED Final   Influenza A NOT DETECTED NOT DETECTED Final   Influenza B NOT DETECTED NOT DETECTED Final   Parainfluenza Virus 1 NOT DETECTED NOT DETECTED Final   Parainfluenza Virus 2 NOT DETECTED NOT DETECTED Final   Parainfluenza Virus 3 NOT DETECTED NOT DETECTED Final   Parainfluenza Virus 4 NOT DETECTED NOT DETECTED Final   Respiratory Syncytial Virus NOT DETECTED NOT DETECTED Final   Bordetella pertussis NOT DETECTED NOT DETECTED Final   Chlamydophila pneumoniae NOT DETECTED NOT DETECTED Final   Mycoplasma pneumoniae NOT DETECTED NOT DETECTED Final    Comment: Performed at [facility], [place]. [address], [place], [place] [address]  SARS CORONAVIRUS 2 (TAT 6-24 HRS) Nasopharyngeal Nasopharyngeal Swab     Status: Abnormal   Collection Time: 12/30/19  2:49 PM   Specimen: Nasopharyngeal Swab  Result Value Ref Range Status   SARS Coronavirus 2 POSITIVE (A) NEGATIVE Final    Comment: RESULT CALLED TO, READ BACK BY AND VERIFIED WITH: M.KENNEY,RN 0017 12/31/19 G.MCADOO (NOTE) SARS-CoV-2 target nucleic acids are DETECTED. The SARS-CoV-2 RNA is generally detectable in upper and lower respiratory specimens during the acute phase of infection. Positive results are indicative of the presence of SARS-CoV-2 RNA. Clinical correlation with patient history and  other diagnostic information is  necessary to determine patient infection status. Positive results do not rule out bacterial infection or co-infection with other viruses.  The expected result is Negative. Fact Sheet for Patients: SugarRoll.be Fact Sheet for Healthcare Providers: https://www.woods-mathews.com/ This test is not yet approved or cleared by the [place] FDA and  has been authorized for detection and/or diagnosis of SARS-CoV-2 by FDA under an Emergency Use Authorization (EUA). This EUA will remain  in effect (meaning this test can be used) for the du ration of the COVID-19 declaration under Section 564(b)(1) of the Act, 21 U.S.C. section 360bbb-3(b)(1), unless the authorization is terminated or revoked sooner. Performed at [facility], [place] [address], [place], [place] [address]   Culture, blood (routine x 2)     Status: None   Collection Time: 12/31/19  2:30 AM   Specimen: BLOOD LEFT WRIST  Result Value Ref Range Status   Specimen Description BLOOD LEFT WRIST  Final   Special Requests   Final    BOTTLES DRAWN AEROBIC ONLY Blood Culture adequate volume   Culture   Final    NO GROWTH 5 DAYS Performed at [facility], [place] [address], [place], [place] [address]    Report Status 01/05/2020 FINAL  Final  Culture, blood (routine x 2)     Status: Abnormal   Collection Time: 12/31/19  2:35 AM   Specimen: BLOOD  Result Value Ref Range Status   Specimen Description BLOOD RIGHT ANTECUBITAL  Final   Special Requests   Final    BOTTLES DRAWN AEROBIC ONLY Blood Culture adequate volume   Culture  Setup Time   Final    AEROBIC BOTTLE ONLY GRAM POSITIVE COCCI CRITICAL RESULT CALLED TO, READ BACK BY AND VERIFIED WITH: KAREN AMEND [place] @ 6568 ON 01/01/20 BY ROBINSON Z.     Culture (A)  Final    STAPHYLOCOCCUS HOMINIS THE SIGNIFICANCE OF ISOLATING THIS ORGANISM FROM A SINGLE SET OF BLOOD CULTURES WHEN MULTIPLE SETS ARE DRAWN  IS UNCERTAIN. PLEASE NOTIFY THE MICROBIOLOGY DEPARTMENT WITHIN ONE WEEK IF SPECIATION AND SENSITIVITIES ARE REQUIRED. Performed at [facility], [place] [address], [place], [place] [address]    Report Status 01/03/2020 FINAL  Final     Time coordinating discharge: 45 minutes  SIGNED:   Tawni Millers, MD  [facility] 01/07/2020, 7:35 AM

## 2020-01-07 NOTE — Plan of Care (Signed)

## 2020-01-09 ENCOUNTER — Other Ambulatory Visit: Payer: Self-pay | Admitting: Family Medicine

## 2020-01-14 ENCOUNTER — Other Ambulatory Visit: Payer: Self-pay | Admitting: *Deleted

## 2020-01-14 DIAGNOSIS — I674 Hypertensive encephalopathy: Secondary | ICD-10-CM | POA: Diagnosis not present

## 2020-01-14 DIAGNOSIS — F0280 Dementia in other diseases classified elsewhere without behavioral disturbance: Secondary | ICD-10-CM | POA: Diagnosis not present

## 2020-01-14 DIAGNOSIS — E038 Other specified hypothyroidism: Secondary | ICD-10-CM | POA: Diagnosis not present

## 2020-01-14 DIAGNOSIS — I5032 Chronic diastolic (congestive) heart failure: Secondary | ICD-10-CM | POA: Diagnosis not present

## 2020-01-14 DIAGNOSIS — J9601 Acute respiratory failure with hypoxia: Secondary | ICD-10-CM | POA: Diagnosis not present

## 2020-01-14 DIAGNOSIS — U071 COVID-19: Secondary | ICD-10-CM | POA: Diagnosis not present

## 2020-01-14 NOTE — Patient Outreach (Signed)
Screened for potential [facility] Care Management needs as a benefit of  [employer].  Douglas Grant is currently receiving skilled therapy at [facility].   Writer attended telephonic interdisciplinary team meeting to assess for disposition needs and transition plan for resident.   Facility reports member is from alone. Family care plan meeting scheduled for tomorrow.   Will continue to follow for transition plans and for potential [facility] needs.    Douglas Rolling, MSN-Ed, RN,BSN [place] Acute Care Coordinator [phone] [facility]) [phone]  (Toll free office)

## 2020-01-20 DIAGNOSIS — J9601 Acute respiratory failure with hypoxia: Secondary | ICD-10-CM | POA: Diagnosis not present

## 2020-01-20 DIAGNOSIS — E039 Hypothyroidism, unspecified: Secondary | ICD-10-CM | POA: Diagnosis not present

## 2020-01-20 DIAGNOSIS — U071 COVID-19: Secondary | ICD-10-CM | POA: Diagnosis not present

## 2020-01-20 DIAGNOSIS — J1282 Pneumonia due to coronavirus disease 2019: Secondary | ICD-10-CM | POA: Diagnosis not present

## 2020-01-21 ENCOUNTER — Other Ambulatory Visit: Payer: Self-pay | Admitting: *Deleted

## 2020-01-21 DIAGNOSIS — I5032 Chronic diastolic (congestive) heart failure: Secondary | ICD-10-CM

## 2020-01-21 NOTE — Patient Outreach (Signed)
Screened for potential [facility] Care Management needs as a benefit of  [employer].  Douglas Grant was receiving skilled therapy at [facility].   Writer attended telephonic interdisciplinary team meeting to assess for disposition needs and transition plan for resident.   Facility reports Douglas Grant transitioned to home on yesterday, 01/20/20. States [facility] home health was arranged.   Will make referral to [facility] for complex case management. Member transitioned to home prior to [employer].   Douglas Grant has a medical history of COVID-19, dementia, CVA, CHF, HTN, HLD, DM.  Marthenia Rolling, MSN-Ed, RN,BSN [place] Acute Care Coordinator [phone] [facility]) [phone]  (Toll free office)

## 2020-01-25 ENCOUNTER — Other Ambulatory Visit: Payer: Self-pay | Admitting: *Deleted

## 2020-01-25 NOTE — Patient Outreach (Signed)
[name] Care Management  01/25/2020  Douglas Grant [birth year] [id number]    Transition of care post d/c SNF 01/20/2020  RN spoke with the daughter Randye Lobo [phone]) concerning [facility] services. RN discussed pt needs at this time caregiver indicates [facility] agency has not contacted her for the initial visit. State she has contacted [employer] (awaiting a call back). RN continued to encourage her to follow up with the facility and provider her with the contact number for the agency ([place]).  Pt has been using his walker but a few days after returning home pt had a fall. No injuries reported. Pt lives alone with daughter and her spouse checking on the pt regularly.  Caregiver also indicated the only instruction she was told to await the [facility] agency to call for an appointment and to follow up with the pt's provider office within 14 days. RN offered to assist by contacting the provider's office with a request  For a follow up appointment.  Based upon the information provided today  offered pt's enrollment for community resources as needed and further education on fall prevention (receptive). Discussed and generated a plan of care (safety-Falls, adherence with follow up for HHealth and medication) with goals and interventions.  Based upon the above pending services will offered to follow up later this week to completed the initial assessment and any arrangements for [facility] and provider office visits. RN will outreach to the provider with the request to contact the pt's daughter for follow up appointment. No further inquires or request at this time. RN offered to review the medications however caregiver prefers to review with the provider's office but will continue to give the same medications prior to pt's discharge ffrom the SNF.  Plan: Will follow up on Friday as requested for the initial assessment.   THN CM Care Plan Problem One     Most Recent Value  Care  Plan Problem One  Impaired safety related to history and recent fall  Role Documenting the Problem One  Care Management Telephonic [place] for Problem One  Active  THN Long Term Goal Start Date  01/25/20  [facility] CM Short Term Goal #1   Caregiver will follow up with SNF for pending HHealth within the next 2 weeks.  THN CM Short Term Goal #1 Start Date  01/25/20  Interventions for Short Term Goal #1  Will discuss the process and strongly encourage caregiver to contact the SNF directlly concerning the the pending orders for [facility] PT/OT. If unsuccessful caregiver aware to contact this RN case manager directly for further assistance.  THN CM Short Term Goal #2   Adherence with post of follow up visit to his primary provider within the next 2 weeks.  THN CM Short Term Goal #2 Start Date  01/25/20  Interventions for Short Term Goal #2  Will assist with this task and communicate with pt's provider with a request for an appointment based upon the caregiver request and permission for this outreach      Raina Mina, RN Care Management Coordinator [facility] [phone]

## 2020-01-28 ENCOUNTER — Other Ambulatory Visit: Payer: Self-pay

## 2020-01-28 ENCOUNTER — Telehealth: Payer: Self-pay | Admitting: Family Medicine

## 2020-01-28 NOTE — Telephone Encounter (Signed)
Angie with [facility] called stating that patient needs an Order or a Verbal order to start [place] post rehab.   Call back number for Angie # [phone]

## 2020-01-29 ENCOUNTER — Other Ambulatory Visit: Payer: Self-pay | Admitting: *Deleted

## 2020-01-29 ENCOUNTER — Ambulatory Visit (INDEPENDENT_AMBULATORY_CARE_PROVIDER_SITE_OTHER): Payer: Medicare Other | Admitting: Family Medicine

## 2020-01-29 ENCOUNTER — Encounter: Payer: Self-pay | Admitting: Family Medicine

## 2020-01-29 ENCOUNTER — Other Ambulatory Visit: Payer: Self-pay

## 2020-01-29 VITALS — BP 160/62 | HR 70 | Temp 98.5°F | Resp 18 | Wt 195.2 lb

## 2020-01-29 DIAGNOSIS — R5381 Other malaise: Secondary | ICD-10-CM | POA: Diagnosis not present

## 2020-01-29 DIAGNOSIS — Z09 Encounter for follow-up examination after completed treatment for conditions other than malignant neoplasm: Secondary | ICD-10-CM | POA: Diagnosis not present

## 2020-01-29 LAB — CBC
HCT: 35.8 % — ABNORMAL LOW (ref 39.0–52.0)
Hemoglobin: 11.6 g/dL — ABNORMAL LOW (ref 13.0–17.0)
MCHC: 32.4 g/dL (ref 30.0–36.0)
MCV: 90.9 fl (ref 78.0–100.0)
Platelets: 354 10*3/uL (ref 150.0–400.0)
RBC: 3.94 Mil/uL — ABNORMAL LOW (ref 4.22–5.81)
RDW: 14.9 % (ref 11.5–15.5)
WBC: 7.6 10*3/uL (ref 4.0–10.5)

## 2020-01-29 LAB — BASIC METABOLIC PANEL
BUN: 19 mg/dL (ref 6–23)
CO2: 33 mEq/L — ABNORMAL HIGH (ref 19–32)
Calcium: 8.8 mg/dL (ref 8.4–10.5)
Chloride: 100 mEq/L (ref 96–112)
Creatinine, Ser: 0.99 mg/dL (ref 0.40–1.50)
GFR: 71.44 mL/min (ref >60.00)
Glucose, Bld: 119 mg/dL — ABNORMAL HIGH (ref 70–99)
Potassium: 5.2 mEq/L — ABNORMAL HIGH (ref 3.5–5.1)
Sodium: 138 mEq/L (ref 135–145)

## 2020-01-29 NOTE — Progress Notes (Signed)
Chief Complaint  Patient presents with  . Hospitalization Follow-up    Daughter wants you to let her dad to know he need to bathe at least 2 times a week and he needs to excercise, He had a recent fall, Friday am. He has been a bit brumpy     Subjective: Patient is a 86 y.o. male here for SNF/inpt f/u. Here w daughter who helps provide hx.  SNF after covid-19 PNA and ARF hospitalization.  He was there for around 9 midnights and discharged to skilled nursing facility.  He just got out 8 days ago.  He was told home health will be set up but they never heard anything.  They are hoping I can help with that.  Memory is largely unchanged.  His breathing is much better but he does get short of breath during physical activity worse than he did prior to admission.  He is no longer coughing.  Denies any pain anywhere.  He did have a fall a couple days ago but appears to be fine today.   ROS: MSK: No pain Const: no fevers  Past Medical History:  Diagnosis Date  . Arthritis   . Benign localized prostatic hyperplasia with lower urinary tract symptoms (LUTS)   . DOE (dyspnea on exertion)   . Full dentures   . Gait disturbance   . GERD (gastroesophageal reflux disease)   . Heart murmur   . History of CVA in adulthood 03/2011   acute infarct right lateral thalamus posteriorly in internal capsule,  per pt no residuals  . Hyperlipidemia   . Hypertension   . Hypothyroidism   . Iron deficiency anemia   . Memory difficulty   . Personal history of colonic polyps   . Type 2 diabetes, diet controlled (HCC)    Objective: BP (!) 160/62 (BP Location: Left Arm, Patient Position: Sitting, Cuff Size: Normal)   Pulse 70   Temp 98.5 F (36.9 C) (Temporal)   Resp 18   Wt 195 lb 3.2 oz (88.5 kg)   SpO2 93%   BMI 28.83 kg/m  General: Awake, appears stated age HEENT: MMM, EOMi Heart: RRR, no LE edema Lungs: CTAB, no rales, wheezes or rhonchi. No accessory muscle use Psych: limited judgment and insight,  normal affect and mood  Assessment and Plan: Physical deconditioning - Plan: Ambulatory referral to [facility], CBC, Basic metabolic panel  Hospital discharge follow-up - Plan: Ambulatory referral to [facility], CBC, Basic metabolic panel  We discussed risks and benefits of ASA and statin therapy for TIA in someone with Don's health. Decided together that we will hold off on taking these medications at this time as he would not likely experience the benefits of this prevention at this stage in his life with his co morbidities.  Told to monitor BP at home. Goal range given <150/90. F/u in 3 mo. The patient and his daughter voiced understanding and agreement to the plan.  [place], DO 01/29/20  2:02 PM

## 2020-01-29 NOTE — Patient Outreach (Signed)
[facility]) Care Management  01/29/2020  Douglas Grant [birth year] [id number]    Transition of care-Unsuccessful  RN attempted to contact pt via caregiver Randye Lobo. Initially RN requested to call back at 3:30pm today due to her appointments. RN returned another call to caregiver Randye Lobo however unsuccessful and only able to leave a HIPAA approved voice message requesting a call back.   Plan: Will attempted another call next week for ongoing [facility] services/Transition of care and attempt once again to obtain the initial assessment to complete pt's enrollment.  Raina Mina, RN Care Management Coordinator [place] Office [phone]

## 2020-01-29 NOTE — Patient Instructions (Addendum)
We need to shower/bathe at least 3-4 times per week.   Give [place] 2-3 business days to get the results of your labs back.   Could try Depends with more absorption.   Stay active.  BP goal is <150 on the top and <90 on the bottom.   Let us know if you need anything.

## 2020-01-31 DIAGNOSIS — I110 Hypertensive heart disease with heart failure: Secondary | ICD-10-CM | POA: Diagnosis not present

## 2020-01-31 DIAGNOSIS — Z9181 History of falling: Secondary | ICD-10-CM | POA: Diagnosis not present

## 2020-01-31 DIAGNOSIS — U071 COVID-19: Secondary | ICD-10-CM | POA: Diagnosis not present

## 2020-01-31 DIAGNOSIS — E785 Hyperlipidemia, unspecified: Secondary | ICD-10-CM | POA: Diagnosis not present

## 2020-01-31 DIAGNOSIS — E1169 Type 2 diabetes mellitus with other specified complication: Secondary | ICD-10-CM | POA: Diagnosis not present

## 2020-01-31 DIAGNOSIS — E039 Hypothyroidism, unspecified: Secondary | ICD-10-CM | POA: Diagnosis not present

## 2020-01-31 DIAGNOSIS — F0390 Unspecified dementia without behavioral disturbance: Secondary | ICD-10-CM | POA: Diagnosis not present

## 2020-01-31 DIAGNOSIS — M1990 Unspecified osteoarthritis, unspecified site: Secondary | ICD-10-CM | POA: Diagnosis not present

## 2020-01-31 DIAGNOSIS — J1282 Pneumonia due to coronavirus disease 2019: Secondary | ICD-10-CM | POA: Diagnosis not present

## 2020-01-31 DIAGNOSIS — I2510 Atherosclerotic heart disease of native coronary artery without angina pectoris: Secondary | ICD-10-CM | POA: Diagnosis not present

## 2020-01-31 DIAGNOSIS — D509 Iron deficiency anemia, unspecified: Secondary | ICD-10-CM | POA: Diagnosis not present

## 2020-01-31 DIAGNOSIS — I5032 Chronic diastolic (congestive) heart failure: Secondary | ICD-10-CM | POA: Diagnosis not present

## 2020-01-31 DIAGNOSIS — J9601 Acute respiratory failure with hypoxia: Secondary | ICD-10-CM | POA: Diagnosis not present

## 2020-01-31 DIAGNOSIS — K219 Gastro-esophageal reflux disease without esophagitis: Secondary | ICD-10-CM | POA: Diagnosis not present

## 2020-01-31 DIAGNOSIS — E1165 Type 2 diabetes mellitus with hyperglycemia: Secondary | ICD-10-CM | POA: Diagnosis not present

## 2020-02-01 ENCOUNTER — Other Ambulatory Visit: Payer: Self-pay | Admitting: Family Medicine

## 2020-02-01 DIAGNOSIS — E875 Hyperkalemia: Secondary | ICD-10-CM

## 2020-02-01 NOTE — Telephone Encounter (Signed)
Called left detailed message of PCP ok 

## 2020-02-02 NOTE — Telephone Encounter (Signed)
[facility] [phone]) called to confirm verbal on PT/Social Worker. PCP verbal order ok given. They will be mailing/sending over paperwork for PCP to sign.

## 2020-02-03 DIAGNOSIS — J9601 Acute respiratory failure with hypoxia: Secondary | ICD-10-CM | POA: Diagnosis not present

## 2020-02-03 DIAGNOSIS — E1165 Type 2 diabetes mellitus with hyperglycemia: Secondary | ICD-10-CM | POA: Diagnosis not present

## 2020-02-03 DIAGNOSIS — I2510 Atherosclerotic heart disease of native coronary artery without angina pectoris: Secondary | ICD-10-CM | POA: Diagnosis not present

## 2020-02-03 DIAGNOSIS — U071 COVID-19: Secondary | ICD-10-CM | POA: Diagnosis not present

## 2020-02-03 DIAGNOSIS — J1282 Pneumonia due to coronavirus disease 2019: Secondary | ICD-10-CM | POA: Diagnosis not present

## 2020-02-03 DIAGNOSIS — F0390 Unspecified dementia without behavioral disturbance: Secondary | ICD-10-CM | POA: Diagnosis not present

## 2020-02-04 ENCOUNTER — Telehealth: Payer: Self-pay | Admitting: Family Medicine

## 2020-02-04 ENCOUNTER — Other Ambulatory Visit: Payer: Self-pay | Admitting: *Deleted

## 2020-02-04 DIAGNOSIS — E1165 Type 2 diabetes mellitus with hyperglycemia: Secondary | ICD-10-CM | POA: Diagnosis not present

## 2020-02-04 DIAGNOSIS — U071 COVID-19: Secondary | ICD-10-CM | POA: Diagnosis not present

## 2020-02-04 DIAGNOSIS — F0390 Unspecified dementia without behavioral disturbance: Secondary | ICD-10-CM | POA: Diagnosis not present

## 2020-02-04 DIAGNOSIS — I2510 Atherosclerotic heart disease of native coronary artery without angina pectoris: Secondary | ICD-10-CM | POA: Diagnosis not present

## 2020-02-04 DIAGNOSIS — J1282 Pneumonia due to coronavirus disease 2019: Secondary | ICD-10-CM | POA: Diagnosis not present

## 2020-02-04 DIAGNOSIS — J9601 Acute respiratory failure with hypoxia: Secondary | ICD-10-CM | POA: Diagnosis not present

## 2020-02-04 NOTE — Patient Outreach (Signed)
[facility]) Care Management  02/04/2020  BABY SAUCEMAN 1930-11-03 [id number]    Telephone Assessment  RN spoke with daughter as scheduled for a call back however not convenient to discuss pt's care today and requested a call back next week.  Plan: RN will rescheduled as requested next week.  Raina Mina, RN Care Management Coordinator [place] Office [phone]

## 2020-02-04 NOTE — Telephone Encounter (Signed)
Caller/Agency: Ailene Ravel w/[facility] Callback Number: [phone] PT eval completed today - no further visits needed

## 2020-02-04 NOTE — Telephone Encounter (Signed)
fyi

## 2020-02-05 ENCOUNTER — Ambulatory Visit: Payer: Medicare Other | Admitting: *Deleted

## 2020-02-05 ENCOUNTER — Other Ambulatory Visit (INDEPENDENT_AMBULATORY_CARE_PROVIDER_SITE_OTHER): Payer: Medicare Other

## 2020-02-05 ENCOUNTER — Other Ambulatory Visit: Payer: Self-pay

## 2020-02-05 ENCOUNTER — Telehealth: Payer: Self-pay

## 2020-02-05 DIAGNOSIS — F0390 Unspecified dementia without behavioral disturbance: Secondary | ICD-10-CM | POA: Diagnosis not present

## 2020-02-05 DIAGNOSIS — U071 COVID-19: Secondary | ICD-10-CM | POA: Diagnosis not present

## 2020-02-05 DIAGNOSIS — E1165 Type 2 diabetes mellitus with hyperglycemia: Secondary | ICD-10-CM | POA: Diagnosis not present

## 2020-02-05 DIAGNOSIS — R0902 Hypoxemia: Secondary | ICD-10-CM | POA: Diagnosis not present

## 2020-02-05 DIAGNOSIS — I160 Hypertensive urgency: Secondary | ICD-10-CM | POA: Diagnosis not present

## 2020-02-05 DIAGNOSIS — E875 Hyperkalemia: Secondary | ICD-10-CM | POA: Diagnosis not present

## 2020-02-05 DIAGNOSIS — J1282 Pneumonia due to coronavirus disease 2019: Secondary | ICD-10-CM | POA: Diagnosis not present

## 2020-02-05 DIAGNOSIS — R9431 Abnormal electrocardiogram [ECG] [EKG]: Secondary | ICD-10-CM | POA: Diagnosis not present

## 2020-02-05 DIAGNOSIS — R404 Transient alteration of awareness: Secondary | ICD-10-CM | POA: Diagnosis not present

## 2020-02-05 DIAGNOSIS — I2510 Atherosclerotic heart disease of native coronary artery without angina pectoris: Secondary | ICD-10-CM | POA: Diagnosis not present

## 2020-02-05 DIAGNOSIS — I10 Essential (primary) hypertension: Secondary | ICD-10-CM | POA: Diagnosis not present

## 2020-02-05 DIAGNOSIS — J9601 Acute respiratory failure with hypoxia: Secondary | ICD-10-CM | POA: Diagnosis not present

## 2020-02-05 DIAGNOSIS — I440 Atrioventricular block, first degree: Secondary | ICD-10-CM | POA: Diagnosis not present

## 2020-02-05 LAB — BASIC METABOLIC PANEL
BUN: 20 mg/dL (ref 6–23)
CO2: 33 mEq/L — ABNORMAL HIGH (ref 19–32)
Calcium: 8.7 mg/dL (ref 8.4–10.5)
Chloride: 98 mEq/L (ref 96–112)
Creatinine, Ser: 1.14 mg/dL (ref 0.40–1.50)
GFR: 60.7 mL/min (ref >60.00)
Glucose, Bld: 244 mg/dL — ABNORMAL HIGH (ref 70–99)
Potassium: 4.7 mEq/L (ref 3.5–5.1)
Sodium: 137 mEq/L (ref 135–145)

## 2020-02-05 NOTE — Telephone Encounter (Signed)
Pt Social worker from [employer] called in to get a verbal order to restart the services for the next week. Please give him a call at [phone]  Thanks,

## 2020-02-07 DIAGNOSIS — I440 Atrioventricular block, first degree: Secondary | ICD-10-CM | POA: Diagnosis not present

## 2020-02-08 NOTE — Telephone Encounter (Signed)
Called left message with verbal ok per PCP

## 2020-02-09 ENCOUNTER — Ambulatory Visit (INDEPENDENT_AMBULATORY_CARE_PROVIDER_SITE_OTHER): Payer: Medicare Other | Admitting: Family Medicine

## 2020-02-09 ENCOUNTER — Encounter: Payer: Self-pay | Admitting: Family Medicine

## 2020-02-09 ENCOUNTER — Other Ambulatory Visit: Payer: Self-pay

## 2020-02-09 VITALS — BP 162/82 | HR 85 | Temp 97.5°F | Ht 67.0 in | Wt 195.0 lb

## 2020-02-09 DIAGNOSIS — U071 COVID-19: Secondary | ICD-10-CM | POA: Diagnosis not present

## 2020-02-09 DIAGNOSIS — I499 Cardiac arrhythmia, unspecified: Secondary | ICD-10-CM | POA: Diagnosis not present

## 2020-02-09 DIAGNOSIS — I10 Essential (primary) hypertension: Secondary | ICD-10-CM | POA: Diagnosis not present

## 2020-02-09 DIAGNOSIS — E1165 Type 2 diabetes mellitus with hyperglycemia: Secondary | ICD-10-CM | POA: Diagnosis not present

## 2020-02-09 DIAGNOSIS — J9601 Acute respiratory failure with hypoxia: Secondary | ICD-10-CM | POA: Diagnosis not present

## 2020-02-09 DIAGNOSIS — F0390 Unspecified dementia without behavioral disturbance: Secondary | ICD-10-CM | POA: Diagnosis not present

## 2020-02-09 DIAGNOSIS — I2510 Atherosclerotic heart disease of native coronary artery without angina pectoris: Secondary | ICD-10-CM | POA: Diagnosis not present

## 2020-02-09 DIAGNOSIS — J1282 Pneumonia due to coronavirus disease 2019: Secondary | ICD-10-CM | POA: Diagnosis not present

## 2020-02-09 MED ORDER — TELMISARTAN 20 MG PO TABS: 20.0000 mg | ORAL_TABLET | Freq: Every day | ORAL | 2 refills | Status: DC

## 2020-02-09 MED FILL — TELMISARTAN 20 MG TABLET: 20 | 30 days supply | Qty: 30 | Fill #0

## 2020-02-09 NOTE — Patient Instructions (Signed)
Try to check blood pressure 2-3 times per week.   Keep the diet clean and stay active.  If you do not hear anything about your referral in the next 1-2 weeks, call our office and ask for an update.  Let us know if you need anything.

## 2020-02-09 NOTE — Progress Notes (Signed)
Chief Complaint  Patient presents with  . Follow-up    hospital and BP elevated    Subjective Douglas Grant is a 86 y.o. male who presents for hypertension follow up. Here w SIL who helps provide hx.  He does not monitor home blood pressures. He is not taking any meds routinely. . Patient has these side effects of medication: none He is usually adhering to a healthy diet overall. Current exercise: walking  Told he has an irreg heart beat at ER. Takes propranolol for tremors. No lightheadedness. Does not follow with cardiology.   Past Medical History:  Diagnosis Date  . Arthritis   . Benign localized prostatic hyperplasia with lower urinary tract symptoms (LUTS)   . DOE (dyspnea on exertion)   . Full dentures   . Gait disturbance   . GERD (gastroesophageal reflux disease)   . Heart murmur   . History of CVA in adulthood 03/2011   acute infarct right lateral thalamus posteriorly in internal capsule,  per pt no residuals  . Hyperlipidemia   . Hypertension   . Hypothyroidism   . Iron deficiency anemia   . Memory difficulty   . Personal history of colonic polyps   . Type 2 diabetes, diet controlled ([place])     Review of Systems Cardiovascular: no chest pain Respiratory:  no shortness of breath  Exam BP (!) 162/82 (BP Location: Left Arm, Patient Position: Sitting, Cuff Size: Normal)   Pulse 85   Temp (!) 97.5 F (36.4 C) (Temporal)   Ht 5\' 7"  (1.702 m)   Wt 88.5 kg   SpO2 93%   BMI 30.54 kg/m  General:  well developed, well nourished, in no apparent distress Heart: RRR, no bruits, no LE edema Lungs: clear to auscultation, no accessory muscle use Psych: Normal mood/affect  Essential hypertension - Plan: telmisartan (MICARDIS) 20 MG tablet  Irregular heart beat - Plan: EKG 12-Lead, HOLTER MONITOR - 72 HOUR  Restart Micardis. Reck in [redacted] weeks along w BMP. He was on in past and did well. Counseled on diet and exercise. EKG shows NSR, no signs of ischemia or T wave  changes. I will ck Holter monitor.  F/u in 2 weeks. The patient voiced understanding and agreement to the plan.  [place], DO 02/09/20  10:58 AM

## 2020-02-10 ENCOUNTER — Telehealth: Payer: Self-pay | Admitting: *Deleted

## 2020-02-10 ENCOUNTER — Ambulatory Visit (INDEPENDENT_AMBULATORY_CARE_PROVIDER_SITE_OTHER): Payer: Medicare Other | Admitting: Cardiology

## 2020-02-10 ENCOUNTER — Ambulatory Visit (INDEPENDENT_AMBULATORY_CARE_PROVIDER_SITE_OTHER): Payer: Medicare Other

## 2020-02-10 ENCOUNTER — Telehealth: Payer: Self-pay | Admitting: Family Medicine

## 2020-02-10 ENCOUNTER — Telehealth: Payer: Self-pay

## 2020-02-10 ENCOUNTER — Ambulatory Visit (HOSPITAL_BASED_OUTPATIENT_CLINIC_OR_DEPARTMENT_OTHER)
Admission: RE | Admit: 2020-02-10 | Discharge: 2020-02-10 | Disposition: A | Discharge status: Home / Self Care | Payer: Medicare Other | Source: Ambulatory Visit | Attending: Cardiology | Admitting: Cardiology

## 2020-02-10 ENCOUNTER — Encounter: Payer: Self-pay | Admitting: Cardiology

## 2020-02-10 VITALS — BP 172/70 | HR 76 | Ht 67.0 in | Wt 195.0 lb

## 2020-02-10 DIAGNOSIS — E785 Hyperlipidemia, unspecified: Secondary | ICD-10-CM | POA: Diagnosis not present

## 2020-02-10 DIAGNOSIS — R0602 Shortness of breath: Secondary | ICD-10-CM | POA: Diagnosis not present

## 2020-02-10 DIAGNOSIS — E1169 Type 2 diabetes mellitus with other specified complication: Secondary | ICD-10-CM | POA: Diagnosis not present

## 2020-02-10 DIAGNOSIS — R002 Palpitations: Secondary | ICD-10-CM | POA: Insufficient documentation

## 2020-02-10 DIAGNOSIS — R0600 Dyspnea, unspecified: Secondary | ICD-10-CM | POA: Diagnosis not present

## 2020-02-10 DIAGNOSIS — J1282 Pneumonia due to coronavirus disease 2019: Secondary | ICD-10-CM

## 2020-02-10 DIAGNOSIS — R7989 Other specified abnormal findings of blood chemistry: Secondary | ICD-10-CM | POA: Diagnosis not present

## 2020-02-10 DIAGNOSIS — U071 COVID-19: Secondary | ICD-10-CM

## 2020-02-10 DIAGNOSIS — R0609 Other forms of dyspnea: Secondary | ICD-10-CM

## 2020-02-10 DIAGNOSIS — I10 Essential (primary) hypertension: Secondary | ICD-10-CM

## 2020-02-10 HISTORY — DX: Palpitations: R00.2

## 2020-02-10 HISTORY — DX: Other specified abnormal findings of blood chemistry: R79.89

## 2020-02-10 MED ORDER — IOHEXOL 350 MG/ML SOLN
100.0000 mL | Freq: Once | INTRAVENOUS | Status: AC | PRN
  Administered 2020-02-10: 100 mL via INTRAVENOUS

## 2020-02-10 NOTE — Telephone Encounter (Signed)
Patient's daughter states patient went for imaging. Per imaging patient has some pneumonia in his lobe. They think that  Dr.Wendling should follow up with patent . Maybe prescribe an antibiotic.

## 2020-02-10 NOTE — Telephone Encounter (Signed)
Call report for CT angio of chest:  Impression: Ascending thoracic aortic diameter measuring 4.0 x 4.0 cm  No evident dissection there is extensive aortic atherosclerosis   There are foci of great vessel and coronary artery calcification recommend annual imaging follow up by CTA or MRA  No pulmonary embolism  Full report should be uploaded in Epic shortly.

## 2020-02-10 NOTE — Patient Instructions (Addendum)
Medication Instructions:  No medication changes. *If you need a refill on your cardiac medications before your next appointment, please call your pharmacy*  Lab Work: None ordered If you have labs (blood work) drawn today and your tests are completely normal, you will receive your results only by: Marland Kitchen MyChart Message (if you have MyChart) OR . A paper copy in the mail If you have any lab test that is abnormal or we need to change your treatment, we will call you to review the results.  Testing/Procedures: Your physician has requested that you have an echocardiogram. Echocardiography is a painless test that uses sound waves to create images of your heart. It provides your doctor with information about the size and shape of your heart and how well your heart's chambers and valves are working. This procedure takes approximately one hour. There are no restrictions for this procedure.  Your physician has recommended that you wear a Zio monitor. Zio monitors are medical devices that record the heart's electrical activity. Doctors most often [place] these monitors to diagnose arrhythmias. Arrhythmias are problems with the speed or rhythm of the heartbeat. The monitor is a small, portable device. You can wear one while you do your normal daily activities. This is usually used to diagnose what is causing palpitations/syncope (passing out). You need to wear the monitor 14 days.  Non-Cardiac CT Angiography (CTA), is a special type of CT scan that uses a computer to produce multi-dimensional views of major blood vessels throughout the body. In CT angiography, a contrast material is injected through an IV to help visualize the blood vessels  Follow-Up: At [facility], you and your health needs are our priority.  As part of our continuing mission to provide you with exceptional heart care, we have created designated Provider Care Teams.  These Care Teams include your primary Cardiologist (physician) and Advanced  Practice Providers (APPs -  Physician Assistants and Nurse Practitioners) who all work together to provide you with the care you need, when you need it.  Your next appointment:   1 month(s)  The format for your next appointment:   In Person  Provider:   Jyl Heinz, MD  Other Instructions NA

## 2020-02-10 NOTE — Progress Notes (Signed)
Cardiology Office Note:    Date:  02/10/2020   ID:  Douglas Grant, DOB 10/18/1930, MRN [id number]  PCP:  Shelda Pal, DO  Cardiologist:  Jenean Lindau, MD   Referring MD: Shelda Pal*    ASSESSMENT:    1. Essential hypertension   2. DOE (dyspnea on exertion)   3. Elevated d-dimer   4. Type 2 diabetes mellitus with hyperlipidemia ([place])   5. Shortness of breath   6. Pneumonia due to COVID-19 virus   7. Palpitations    PLAN:    In order of problems listed above:  1. Dyspnea on exertion: I discussed my findings with the patient and the family member.  His symptoms are of concern.  The patient has had blood work recently.  D-dimer was elevated in the past but I do not see a CT scan done.  His D-dimer was stable but in view of the symptoms I would like to get a CT scan with contrast.  This will help [place] assess for any objective evidence of pulmonary embolism and also assess the lungs post COVID-19 infection.  He is agreeable.  Benefits and potential is explained and they vocalized understanding. 2. Essential hypertension: Blood pressure is elevated.  He is taken his first dose of telmisartan today and we will monitor his blood pressure closely. 3. Palpitations: We will do a 2-week event monitor to understand this to see if there are any significant arrhythmias in the light of the above discussion. 4.  Cardiac murmur: Echocardiogram will be done to assess murmur on auscultation 5. Patient will be seen in follow-up appointment in 1 to 2 weeks.  I told him to keep a track of his blood pressure and he agrees.  He knows to go to the nearest emergency room for any   Medication Adjustments/Labs and Tests Ordered: Current medicines are reviewed at length with the patient today.  Concerns regarding medicines are outlined above.  Orders Placed This Encounter  Procedures  . CT ANGIO CHEST PE W OR WO CONTRAST  . LONG TERM MONITOR (3-14 DAYS)  . ECHOCARDIOGRAM COMPLETE    No orders of the defined types were placed in this encounter.    History of Present Illness:    Douglas Grant is a 86 y.o. male who is being seen today for the evaluation of dyspnea on exertion at the request of Shelda Pal*.  Patient is a pleasant 86 year old male.  He is brought in by a family member.  He has history of essential hypertension, COVID-19 pneumonia in January.  He is brought in here because of elevated blood pressure and shortness of breath on exertion.  He denies any chest pain orthopnea or PND.  He complains of palpitations occasionally.  There is mention of irregular heartbeat in the chart but no mention of any arrhythmia.  At the time of my evaluation, the patient is alert awake oriented and in no distress.  Past Medical History:  Diagnosis Date  . Arthritis   . Benign localized prostatic hyperplasia with lower urinary tract symptoms (LUTS)   . DOE (dyspnea on exertion)   . Full dentures   . Gait disturbance   . GERD (gastroesophageal reflux disease)   . Heart murmur   . History of CVA in adulthood 03/2011   acute infarct right lateral thalamus posteriorly in internal capsule,  per pt no residuals  . Hyperlipidemia   . Hypertension   . Hypothyroidism   . Iron deficiency anemia   .  Memory difficulty   . Personal history of colonic polyps   . Type 2 diabetes, diet controlled ([place])     Past Surgical History:  Procedure Laterality Date  . CATARACT EXTRACTION W/ INTRAOCULAR LENS  IMPLANT, BILATERAL  2017  . CIRCUMCISION N/A 10/02/2018   Procedure: CIRCUMCISION ADULT;  Surgeon: [name], MD;  Location: [facility];  Service: Urology;  Laterality: N/A;  . HERNIA REPAIR    . LAPAROSCOPIC CHOLECYSTECTOMY  early 2000s    Current Medications: Current Meds  Medication Sig  . alfuzosin (UROXATRAL) 10 MG 24 hr tablet Take 1 tablet (10 mg total) by mouth daily. (Patient taking differently: Take 10 mg by mouth at bedtime. )  . cetirizine (ZYRTEC) 10 MG  tablet Take 1 tablet (10 mg total) by mouth daily.  . Cyanocobalamin (B-12) 1000 MCG TABS Take 1,000 mcg by mouth daily.  Marland Kitchen donepezil (ARICEPT) 10 MG tablet TAKE 1 TABLET AT BEDTIME (Patient taking differently: Take 10 mg by mouth at bedtime. )  . Ferrous Sulfate Dried (EQ SLOW-RELEASE IRON) 45 MG TBCR Take 45 mg by mouth 2 (two) times daily.  . finasteride (PROSCAR) 5 MG tablet TAKE 1 TABLET DAILY (Patient taking differently: Take 5 mg by mouth daily. )  . levothyroxine (SYNTHROID) 75 MCG tablet TAKE 1 TABLET DAILY  . memantine (NAMENDA) 5 MG tablet Take 1 tablet (5 mg total) by mouth 2 (two) times daily. 1 tab twice daily.  Marland Kitchen omeprazole (PRILOSEC) 40 MG capsule TAKE 1 CAPSULE DAILY (Patient taking differently: Take 40 mg by mouth daily. )  . pravastatin (PRAVACHOL) 40 MG tablet Take 40 mg by mouth daily.  . propranolol ER (INDERAL LA) 60 MG 24 hr capsule TAKE 1 CAPSULE DAILY (Patient taking differently: Take 60 mg by mouth at bedtime. )  . QUEtiapine (SEROQUEL) 100 MG tablet TAKE 1 TABLET AT BEDTIME (Patient taking differently: Take 100 mg by mouth at bedtime. )  . telmisartan (MICARDIS) 20 MG tablet Take 1 tablet (20 mg total) by mouth daily.  Marland Kitchen venlafaxine XR (EFFEXOR-XR) 75 MG 24 hr capsule TAKE 1 CAPSULE DAILY WITH BREAKFAST (Patient taking differently: Take 75 mg by mouth daily with breakfast. )     Allergies:   Atenolol, Clonidine hydrochloride, Prednisone, and Verapamil   Social History   Socioeconomic History  . Marital status: Widowed    Spouse name: Not on file  . Number of children: Not on file  . Years of education: Not on file  . Highest education level: Not on file  Occupational History  . Not on file  Tobacco Use  . Smoking status: Former Smoker    Years: 30.00    Types: Cigarettes    Quit date: 04/18/1985    Years since quitting: 34.8  . Smokeless tobacco: Never Used  Substance and Sexual Activity  . Alcohol use: No    Alcohol/week: 0.0 standard drinks  . Drug  use: Never  . Sexual activity: Never  Other Topics Concern  . Not on file  Social History Narrative  . Not on file   Social Determinants of Health   Financial Resource Strain:   . Difficulty of Paying Living Expenses: Not on file  Food Insecurity:   . Worried About Charity fundraiser in the Last Year: Not on file  . Ran Out of Food in the Last Year: Not on file  Transportation Needs:   . Lack of Transportation (Medical): Not on file  . Lack of Transportation (Non-Medical): Not on file  Physical Activity:   . Days of Exercise per Week: Not on file  . Minutes of Exercise per Session: Not on file  Stress:   . Feeling of Stress : Not on file  Social Connections:   . Frequency of Communication with Friends and Family: Not on file  . Frequency of Social Gatherings with Friends and Family: Not on file  . Attends Religious Services: Not on file  . Active Member of Clubs or Organizations: Not on file  . Attends Archivist Meetings: Not on file  . Marital Status: Not on file     Family History: The patient's family history includes Alcohol abuse in his brother and father; Depression in his mother.  ROS:   Please see the history of present illness.    All other systems reviewed and are negative.  EKGs/Labs/Other Studies Reviewed:    The following studies were reviewed today: EKG reveals sinus rhythm and sinus arrhythmia nonspecific ST-T changes   Recent Labs: 12/30/2019: B Natriuretic Peptide 435.1 01/04/2020: ALT 18; Magnesium 1.8 01/29/2020: Hemoglobin 11.6; Platelets 354.0 02/05/2020: BUN 20; Creatinine, Ser 1.14; Potassium 4.7; Sodium 137  Recent Lipid Panel    Component Value Date/Time   CHOL 141 12/31/2019 0100   TRIG 61 12/31/2019 0100   HDL 32 (L) 12/31/2019 0100   CHOLHDL 4.4 12/31/2019 0100   VLDL 12 12/31/2019 0100   LDLCALC 97 12/31/2019 0100   LDLDIRECT 155.3 10/06/2010 0850    Physical Exam:    VS:  BP (!) 172/70   Pulse 76   Ht 5\' 7"  (1.702 m)    Wt 195 lb (88.5 kg)   SpO2 93%   BMI 30.54 kg/m     Wt Readings from Last 3 Encounters:  02/10/20 195 lb (88.5 kg)  02/09/20 195 lb (88.5 kg)  01/29/20 195 lb 3.2 oz (88.5 kg)     GEN: Patient is in no acute distress HEENT: Normal NECK: No JVD; No carotid bruits LYMPHATICS: No lymphadenopathy CARDIAC: S1 S2 regular, 2/6 systolic murmur at the apex. RESPIRATORY:  Clear to auscultation without rales, wheezing or rhonchi  ABDOMEN: Soft, non-tender, non-distended MUSCULOSKELETAL:  No edema; No deformity  SKIN: Warm and dry NEUROLOGIC:  Alert and oriented x 3 PSYCHIATRIC:  Normal affect    Signed, Jenean Lindau, MD  02/10/2020 9:14 AM    [place]

## 2020-02-10 NOTE — Telephone Encounter (Signed)
Called the daughter back to inform per PCP this could be residual from Covid and usually clears up after 6 weeks.  But if he is having symptoms, coughing ,sob he would send in something. The daughter stated he to her knowledge is doing ok.  But will call/message if anything changes.

## 2020-02-10 NOTE — Telephone Encounter (Signed)
-----   Message from Jenean Lindau, MD sent at 02/10/2020 11:25 AM EST ----- Please advised the patient to be well-hydrated.  He has 4 cm ascending aorta dilation for which there is no imminent issue at this point and we will monitor it closely and optimize his blood pressure control.  He is pneumonialike patch on the CT should be followed by his primary care physician.  Please call them and let the nurse know that this needs to be followed by them.  Please also let the patient know about this.  Please document, Jenean Lindau, MD 02/10/2020 11:24 AM

## 2020-02-11 ENCOUNTER — Encounter: Payer: Self-pay | Admitting: *Deleted

## 2020-02-11 ENCOUNTER — Other Ambulatory Visit: Payer: Self-pay | Admitting: *Deleted

## 2020-02-11 NOTE — Patient Outreach (Signed)
[facility]) Care Management  02/11/2020  Douglas Grant 08/29/1930 [id number]  Completed the Initial Assessment   RN spoke with daughter today Randye Lobo) however very busy and limited with her scheduled.RN was able to completed the initial assessment information as caregiver was very grateful for RN case manager following up to completed this task. Moving forward  Caregiver has requested for quarterly follow up calls as this was presented in the beginning based upon her work scheduled and missed call from this RN. No other issues or inquires. Based upon the assessment today RN will mail a A.D packet as requested for pt and family to review and decide if this is something she would like to complete. No assistance needed from [facility]. Will request packet to be mailed directly to the pt's daughter's address. Based upon the recent enrollment will follow up quarterly as requested and update the plan of care at that time. No additional assistance needed at this time.  Raina Mina, RN Care Management Coordinator [place] Office [phone]

## 2020-02-13 DIAGNOSIS — U071 COVID-19: Secondary | ICD-10-CM | POA: Diagnosis not present

## 2020-02-13 DIAGNOSIS — J9601 Acute respiratory failure with hypoxia: Secondary | ICD-10-CM | POA: Diagnosis not present

## 2020-02-13 DIAGNOSIS — F0390 Unspecified dementia without behavioral disturbance: Secondary | ICD-10-CM | POA: Diagnosis not present

## 2020-02-13 DIAGNOSIS — I2510 Atherosclerotic heart disease of native coronary artery without angina pectoris: Secondary | ICD-10-CM | POA: Diagnosis not present

## 2020-02-13 DIAGNOSIS — J1282 Pneumonia due to coronavirus disease 2019: Secondary | ICD-10-CM | POA: Diagnosis not present

## 2020-02-13 DIAGNOSIS — E1165 Type 2 diabetes mellitus with hyperglycemia: Secondary | ICD-10-CM | POA: Diagnosis not present

## 2020-02-16 DIAGNOSIS — E1165 Type 2 diabetes mellitus with hyperglycemia: Secondary | ICD-10-CM | POA: Diagnosis not present

## 2020-02-16 DIAGNOSIS — J9601 Acute respiratory failure with hypoxia: Secondary | ICD-10-CM | POA: Diagnosis not present

## 2020-02-16 DIAGNOSIS — I2510 Atherosclerotic heart disease of native coronary artery without angina pectoris: Secondary | ICD-10-CM | POA: Diagnosis not present

## 2020-02-16 DIAGNOSIS — F0390 Unspecified dementia without behavioral disturbance: Secondary | ICD-10-CM | POA: Diagnosis not present

## 2020-02-16 DIAGNOSIS — J1282 Pneumonia due to coronavirus disease 2019: Secondary | ICD-10-CM | POA: Diagnosis not present

## 2020-02-16 DIAGNOSIS — U071 COVID-19: Secondary | ICD-10-CM | POA: Diagnosis not present

## 2020-02-17 ENCOUNTER — Ambulatory Visit (HOSPITAL_BASED_OUTPATIENT_CLINIC_OR_DEPARTMENT_OTHER)
Admission: RE | Admit: 2020-02-17 | Discharge: 2020-02-17 | Disposition: A | Discharge status: Home / Self Care | Payer: Medicare Other | Source: Ambulatory Visit | Attending: Cardiology | Admitting: Cardiology

## 2020-02-17 ENCOUNTER — Other Ambulatory Visit: Payer: Self-pay

## 2020-02-17 DIAGNOSIS — R7989 Other specified abnormal findings of blood chemistry: Secondary | ICD-10-CM | POA: Diagnosis not present

## 2020-02-17 DIAGNOSIS — I351 Nonrheumatic aortic (valve) insufficiency: Secondary | ICD-10-CM | POA: Insufficient documentation

## 2020-02-17 DIAGNOSIS — E1169 Type 2 diabetes mellitus with other specified complication: Secondary | ICD-10-CM

## 2020-02-17 DIAGNOSIS — R0602 Shortness of breath: Secondary | ICD-10-CM | POA: Diagnosis not present

## 2020-02-17 DIAGNOSIS — I10 Essential (primary) hypertension: Secondary | ICD-10-CM

## 2020-02-17 DIAGNOSIS — R0600 Dyspnea, unspecified: Secondary | ICD-10-CM | POA: Diagnosis not present

## 2020-02-17 DIAGNOSIS — J1282 Pneumonia due to coronavirus disease 2019: Secondary | ICD-10-CM | POA: Diagnosis not present

## 2020-02-17 DIAGNOSIS — E785 Hyperlipidemia, unspecified: Secondary | ICD-10-CM

## 2020-02-17 DIAGNOSIS — U071 COVID-19: Secondary | ICD-10-CM | POA: Diagnosis not present

## 2020-02-17 DIAGNOSIS — I110 Hypertensive heart disease with heart failure: Secondary | ICD-10-CM | POA: Insufficient documentation

## 2020-02-17 DIAGNOSIS — Z8616 Personal history of COVID-19: Secondary | ICD-10-CM | POA: Diagnosis not present

## 2020-02-17 DIAGNOSIS — I509 Heart failure, unspecified: Secondary | ICD-10-CM | POA: Diagnosis not present

## 2020-02-17 NOTE — Progress Notes (Signed)
  Echocardiogram 2D Echocardiogram has been performed.  Cardell Peach 02/17/2020, 9:52 AM

## 2020-02-19 ENCOUNTER — Other Ambulatory Visit: Payer: Self-pay | Admitting: Family Medicine

## 2020-02-19 ENCOUNTER — Telehealth: Payer: Self-pay | Admitting: Family Medicine

## 2020-02-19 ENCOUNTER — Other Ambulatory Visit: Payer: Self-pay

## 2020-02-19 DIAGNOSIS — J9601 Acute respiratory failure with hypoxia: Secondary | ICD-10-CM | POA: Diagnosis not present

## 2020-02-19 DIAGNOSIS — F0390 Unspecified dementia without behavioral disturbance: Secondary | ICD-10-CM | POA: Diagnosis not present

## 2020-02-19 DIAGNOSIS — J1282 Pneumonia due to coronavirus disease 2019: Secondary | ICD-10-CM | POA: Diagnosis not present

## 2020-02-19 DIAGNOSIS — I2510 Atherosclerotic heart disease of native coronary artery without angina pectoris: Secondary | ICD-10-CM | POA: Diagnosis not present

## 2020-02-19 DIAGNOSIS — U071 COVID-19: Secondary | ICD-10-CM | POA: Diagnosis not present

## 2020-02-19 DIAGNOSIS — E1165 Type 2 diabetes mellitus with hyperglycemia: Secondary | ICD-10-CM | POA: Diagnosis not present

## 2020-02-19 NOTE — Telephone Encounter (Signed)
Caller Name: Chriss Czar, social worker w/[facility] Phone: [phone]  Requesting VO to discontinue social work

## 2020-02-19 NOTE — Telephone Encounter (Signed)
OK 

## 2020-02-22 ENCOUNTER — Telehealth: Payer: Self-pay | Admitting: Family Medicine

## 2020-02-22 DIAGNOSIS — J9601 Acute respiratory failure with hypoxia: Secondary | ICD-10-CM | POA: Diagnosis not present

## 2020-02-22 DIAGNOSIS — I2510 Atherosclerotic heart disease of native coronary artery without angina pectoris: Secondary | ICD-10-CM | POA: Diagnosis not present

## 2020-02-22 DIAGNOSIS — F0390 Unspecified dementia without behavioral disturbance: Secondary | ICD-10-CM | POA: Diagnosis not present

## 2020-02-22 DIAGNOSIS — U071 COVID-19: Secondary | ICD-10-CM | POA: Diagnosis not present

## 2020-02-22 DIAGNOSIS — J1282 Pneumonia due to coronavirus disease 2019: Secondary | ICD-10-CM | POA: Diagnosis not present

## 2020-02-22 DIAGNOSIS — E1165 Type 2 diabetes mellitus with hyperglycemia: Secondary | ICD-10-CM | POA: Diagnosis not present

## 2020-02-22 NOTE — Telephone Encounter (Signed)
Order is in for 72 hr holter monitor. [facility] prefers referral for cardiology consult prior. Do you want to refer to cardiology or change holter monitor order to [facility]?

## 2020-02-23 ENCOUNTER — Other Ambulatory Visit: Payer: Self-pay

## 2020-02-23 ENCOUNTER — Encounter: Payer: Self-pay | Admitting: Family Medicine

## 2020-02-23 ENCOUNTER — Ambulatory Visit (INDEPENDENT_AMBULATORY_CARE_PROVIDER_SITE_OTHER): Payer: Medicare Other | Admitting: Family Medicine

## 2020-02-23 ENCOUNTER — Other Ambulatory Visit: Payer: Self-pay | Admitting: Family Medicine

## 2020-02-23 VITALS — BP 142/74 | HR 84 | Temp 97.0°F | Ht 67.0 in | Wt 196.2 lb

## 2020-02-23 DIAGNOSIS — I10 Essential (primary) hypertension: Secondary | ICD-10-CM

## 2020-02-23 NOTE — Progress Notes (Signed)
CC: HTN  Subjective Douglas Grant is a 86 y.o. male who presents for hypertension follow up. Here w Son in law who helps provide hx.  He does monitor home blood pressures. Blood pressures ranging from 150's/70's on average. He is compliant with medications- Micardis 20 mg/d. Patient has these side effects of medication: none He is usually adhering to a healthy diet overall. Current exercise: working with PT, some walking   Past Medical History:  Diagnosis Date  . Arthritis   . Benign localized prostatic hyperplasia with lower urinary tract symptoms (LUTS)   . DOE (dyspnea on exertion)   . Full dentures   . Gait disturbance   . GERD (gastroesophageal reflux disease)   . Heart murmur   . History of CVA in adulthood 03/2011   acute infarct right lateral thalamus posteriorly in internal capsule,  per pt no residuals  . Hyperlipidemia   . Hypertension   . Hypothyroidism   . Iron deficiency anemia   . Memory difficulty   . Personal history of colonic polyps   . Type 2 diabetes, diet controlled ([place])     Review of Systems Cardiovascular: no chest pain Respiratory:  no shortness of breath  Exam BP (!) 142/74 (BP Location: Left Arm, Patient Position: Sitting, Cuff Size: Normal)   Pulse 84   Temp (!) 97 F (36.1 C) (Oral)   Ht 5\' 7"  (1.702 m)   Wt 196 lb 4 oz (89 kg)   SpO2 93%   BMI 30.74 kg/m  General:  well developed, well nourished, in no apparent distress Heart: RRR, no bruits, 2+ pitting LE edema tapering at mid tibia Lungs: clear to auscultation, no accessory muscle use Psych: normal range of affect and limited judgment/insight  Essential hypertension  Cont Micardis at 20 mg/d. Send message in 2 weeks if not [id number] systolic. Will increase to 40 mg/d.  Counseled on diet and exercise. F/u as originally scheduled. The patient and his SIL voiced understanding and agreement to the plan.  [place], DO 02/23/20  8:34 AM

## 2020-02-23 NOTE — Telephone Encounter (Signed)
RE- Yes, that's fine to put in referral. Ty.  KO- Are they requesting that for all monitors now, regardless of duration?

## 2020-02-23 NOTE — Patient Instructions (Signed)
Send me a message in 2 weeks if blood pressures consistently 150 or higher.  Stay on the current medication.  Keep the diet clean and stay active.  Let us know if you need anything.

## 2020-02-23 NOTE — Telephone Encounter (Signed)
Dr. Viona Gilmore - yes [facility] Card[facility] office is requesting referral for consult rather than the order for a monitor

## 2020-02-25 DIAGNOSIS — F0390 Unspecified dementia without behavioral disturbance: Secondary | ICD-10-CM | POA: Diagnosis not present

## 2020-02-25 DIAGNOSIS — J1282 Pneumonia due to coronavirus disease 2019: Secondary | ICD-10-CM | POA: Diagnosis not present

## 2020-02-25 DIAGNOSIS — J9601 Acute respiratory failure with hypoxia: Secondary | ICD-10-CM | POA: Diagnosis not present

## 2020-02-25 DIAGNOSIS — I2510 Atherosclerotic heart disease of native coronary artery without angina pectoris: Secondary | ICD-10-CM | POA: Diagnosis not present

## 2020-02-25 DIAGNOSIS — U071 COVID-19: Secondary | ICD-10-CM | POA: Diagnosis not present

## 2020-02-25 DIAGNOSIS — E1165 Type 2 diabetes mellitus with hyperglycemia: Secondary | ICD-10-CM | POA: Diagnosis not present

## 2020-02-26 ENCOUNTER — Telehealth: Payer: Self-pay | Admitting: Family Medicine

## 2020-02-26 NOTE — Telephone Encounter (Signed)
Verbal Order for Physical Therapy to Continue Week of March 8,2021  Caller/Agency: [facility] Number: [phone] Requesting /PT   Frequency:  2 times a week for 4weeks  1 time a week for  1week

## 2020-02-26 NOTE — Telephone Encounter (Signed)
Called left detailed message of verbal PCP ok

## 2020-02-29 ENCOUNTER — Ambulatory Visit: Payer: Medicare Other | Admitting: Family Medicine

## 2020-02-29 DIAGNOSIS — U071 COVID-19: Secondary | ICD-10-CM | POA: Diagnosis not present

## 2020-02-29 DIAGNOSIS — J9601 Acute respiratory failure with hypoxia: Secondary | ICD-10-CM | POA: Diagnosis not present

## 2020-02-29 DIAGNOSIS — F0390 Unspecified dementia without behavioral disturbance: Secondary | ICD-10-CM | POA: Diagnosis not present

## 2020-02-29 DIAGNOSIS — J1282 Pneumonia due to coronavirus disease 2019: Secondary | ICD-10-CM | POA: Diagnosis not present

## 2020-02-29 DIAGNOSIS — E1165 Type 2 diabetes mellitus with hyperglycemia: Secondary | ICD-10-CM | POA: Diagnosis not present

## 2020-02-29 DIAGNOSIS — I2510 Atherosclerotic heart disease of native coronary artery without angina pectoris: Secondary | ICD-10-CM | POA: Diagnosis not present

## 2020-03-01 DIAGNOSIS — I5032 Chronic diastolic (congestive) heart failure: Secondary | ICD-10-CM | POA: Diagnosis not present

## 2020-03-01 DIAGNOSIS — E785 Hyperlipidemia, unspecified: Secondary | ICD-10-CM | POA: Diagnosis not present

## 2020-03-01 DIAGNOSIS — D509 Iron deficiency anemia, unspecified: Secondary | ICD-10-CM | POA: Diagnosis not present

## 2020-03-01 DIAGNOSIS — J1282 Pneumonia due to coronavirus disease 2019: Secondary | ICD-10-CM | POA: Diagnosis not present

## 2020-03-01 DIAGNOSIS — I110 Hypertensive heart disease with heart failure: Secondary | ICD-10-CM | POA: Diagnosis not present

## 2020-03-01 DIAGNOSIS — U071 COVID-19: Secondary | ICD-10-CM | POA: Diagnosis not present

## 2020-03-01 DIAGNOSIS — E1165 Type 2 diabetes mellitus with hyperglycemia: Secondary | ICD-10-CM | POA: Diagnosis not present

## 2020-03-01 DIAGNOSIS — E039 Hypothyroidism, unspecified: Secondary | ICD-10-CM | POA: Diagnosis not present

## 2020-03-01 DIAGNOSIS — F0390 Unspecified dementia without behavioral disturbance: Secondary | ICD-10-CM | POA: Diagnosis not present

## 2020-03-01 DIAGNOSIS — Z9181 History of falling: Secondary | ICD-10-CM | POA: Diagnosis not present

## 2020-03-01 DIAGNOSIS — E1169 Type 2 diabetes mellitus with other specified complication: Secondary | ICD-10-CM | POA: Diagnosis not present

## 2020-03-01 DIAGNOSIS — M1990 Unspecified osteoarthritis, unspecified site: Secondary | ICD-10-CM | POA: Diagnosis not present

## 2020-03-01 DIAGNOSIS — I2510 Atherosclerotic heart disease of native coronary artery without angina pectoris: Secondary | ICD-10-CM | POA: Diagnosis not present

## 2020-03-01 DIAGNOSIS — J9601 Acute respiratory failure with hypoxia: Secondary | ICD-10-CM | POA: Diagnosis not present

## 2020-03-01 DIAGNOSIS — K219 Gastro-esophageal reflux disease without esophagitis: Secondary | ICD-10-CM | POA: Diagnosis not present

## 2020-03-02 DIAGNOSIS — I2510 Atherosclerotic heart disease of native coronary artery without angina pectoris: Secondary | ICD-10-CM | POA: Diagnosis not present

## 2020-03-02 DIAGNOSIS — J1282 Pneumonia due to coronavirus disease 2019: Secondary | ICD-10-CM | POA: Diagnosis not present

## 2020-03-02 DIAGNOSIS — U071 COVID-19: Secondary | ICD-10-CM | POA: Diagnosis not present

## 2020-03-02 DIAGNOSIS — J9601 Acute respiratory failure with hypoxia: Secondary | ICD-10-CM | POA: Diagnosis not present

## 2020-03-02 DIAGNOSIS — E1165 Type 2 diabetes mellitus with hyperglycemia: Secondary | ICD-10-CM | POA: Diagnosis not present

## 2020-03-02 DIAGNOSIS — F0390 Unspecified dementia without behavioral disturbance: Secondary | ICD-10-CM | POA: Diagnosis not present

## 2020-03-03 ENCOUNTER — Other Ambulatory Visit: Payer: Self-pay

## 2020-03-03 ENCOUNTER — Ambulatory Visit (INDEPENDENT_AMBULATORY_CARE_PROVIDER_SITE_OTHER): Payer: Medicare Other | Admitting: Cardiology

## 2020-03-03 ENCOUNTER — Encounter: Payer: Self-pay | Admitting: Cardiology

## 2020-03-03 VITALS — BP 200/90 | HR 50 | Ht 67.0 in | Wt 195.0 lb

## 2020-03-03 DIAGNOSIS — E1169 Type 2 diabetes mellitus with other specified complication: Secondary | ICD-10-CM | POA: Diagnosis not present

## 2020-03-03 DIAGNOSIS — I10 Essential (primary) hypertension: Secondary | ICD-10-CM

## 2020-03-03 DIAGNOSIS — E785 Hyperlipidemia, unspecified: Secondary | ICD-10-CM | POA: Diagnosis not present

## 2020-03-03 DIAGNOSIS — R531 Weakness: Secondary | ICD-10-CM | POA: Diagnosis not present

## 2020-03-03 DIAGNOSIS — R918 Other nonspecific abnormal finding of lung field: Secondary | ICD-10-CM

## 2020-03-03 DIAGNOSIS — I2510 Atherosclerotic heart disease of native coronary artery without angina pectoris: Secondary | ICD-10-CM | POA: Diagnosis not present

## 2020-03-03 NOTE — Progress Notes (Signed)
Cardiology Office Note:    Date:  03/03/2020   ID:  Douglas Grant, DOB 02/11/1930, MRN [id number]  PCP:  Shelda Pal, DO  Cardiologist:  Jenean Lindau, MD   Referring MD: Shelda Pal*    ASSESSMENT:    1. Atherosclerosis of native coronary artery of native heart without angina pectoris   2. Essential hypertension   3. Hyperlipidemia LDL goal <100   4. Type 2 diabetes mellitus with hyperlipidemia (HCC)    PLAN:    In order of problems listed above:  1. Coronary artery disease: CT scan is revealed extensive calcifications in the coronary arteries.  In view of this and dyspnea on exertion we will do a Lexiscan sestamibi to assess for any obstructive coronary artery disease. 2. Essential hypertension: Blood pressure stable and son-in-law mention blood pressures at home and they appear to be fine.  Diet was discussed salt issues were discussed. 3. Mixed dyslipidemia: Patient will have blood work in the next few days including fasting lipids and we will initiate him on statin therapy based on these findings. 4. His CT scan of the chest was revealing of patch-like process in the lungs and we will do a follow-up chest x-ray today.  This will be managed by his primary care physician moving forward. 5. Patient will be seen in follow-up appointment in 4 months or earlier if the patient has any concerns 6. Ascending aortic dilatation and the findings were detailed to him at length and he vocalized understanding.  Questions were answered to his satisfaction.   Medication Adjustments/Labs and Tests Ordered: Current medicines are reviewed at length with the patient today.  Concerns regarding medicines are outlined above.  No orders of the defined types were placed in this encounter.  No orders of the defined types were placed in this encounter.    Chief Complaint  Patient presents with  . Follow-up    1 Month      History of Present Illness:    Douglas Grant  is a 86 y.o. male.  Patient was evaluated by me.  He has history of hypertension dyslipidemia and his ascending aorta is dilated.  He denies any problems at this time and takes care of activities of daily living except some dyspnea on exertion.  His son-in-law accompanies him for this visit.  Past Medical History:  Diagnosis Date  . Arthritis   . Benign localized prostatic hyperplasia with lower urinary tract symptoms (LUTS)   . DOE (dyspnea on exertion)   . Full dentures   . Gait disturbance   . GERD (gastroesophageal reflux disease)   . Heart murmur   . History of CVA in adulthood 03/2011   acute infarct right lateral thalamus posteriorly in internal capsule,  per pt no residuals  . Hyperlipidemia   . Hypertension   . Hypothyroidism   . Iron deficiency anemia   . Memory difficulty   . Personal history of colonic polyps   . Type 2 diabetes, diet controlled ([place])     Past Surgical History:  Procedure Laterality Date  . CATARACT EXTRACTION W/ INTRAOCULAR LENS  IMPLANT, BILATERAL  2017  . CIRCUMCISION N/A 10/02/2018   Procedure: CIRCUMCISION ADULT;  Surgeon: [name], MD;  Location: [facility];  Service: Urology;  Laterality: N/A;  . HERNIA REPAIR    . LAPAROSCOPIC CHOLECYSTECTOMY  early 2000s    Current Medications: Current Meds  Medication Sig  . alfuzosin (UROXATRAL) 10 MG 24 hr tablet Take 1 tablet (  10 mg total) by mouth daily. (Patient taking differently: Take 10 mg by mouth at bedtime. )  . cetirizine (ZYRTEC) 10 MG tablet Take 1 tablet (10 mg total) by mouth daily.  . Cyanocobalamin (B-12) 1000 MCG TABS Take 1,000 mcg by mouth daily.  Marland Kitchen donepezil (ARICEPT) 10 MG tablet TAKE 1 TABLET AT BEDTIME  . Ferrous Sulfate Dried (EQ SLOW-RELEASE IRON) 45 MG TBCR Take 45 mg by mouth 2 (two) times daily.  . finasteride (PROSCAR) 5 MG tablet TAKE 1 TABLET DAILY  . levothyroxine (SYNTHROID) 75 MCG tablet TAKE 1 TABLET DAILY  . memantine (NAMENDA) 5 MG tablet Take 1 tablet (5 mg total)  by mouth 2 (two) times daily. 1 tab twice daily.  Marland Kitchen omeprazole (PRILOSEC) 40 MG capsule TAKE 1 CAPSULE DAILY (Patient taking differently: Take 40 mg by mouth daily. )  . pravastatin (PRAVACHOL) 40 MG tablet Take 40 mg by mouth daily.  . propranolol ER (INDERAL LA) 60 MG 24 hr capsule TAKE 1 CAPSULE DAILY (Patient taking differently: Take 60 mg by mouth at bedtime. )  . QUEtiapine (SEROQUEL) 100 MG tablet TAKE 1 TABLET AT BEDTIME (Patient taking differently: Take 100 mg by mouth at bedtime. )  . telmisartan (MICARDIS) 20 MG tablet Take 1 tablet (20 mg total) by mouth daily.  Marland Kitchen venlafaxine XR (EFFEXOR-XR) 75 MG 24 hr capsule TAKE 1 CAPSULE DAILY WITH BREAKFAST (Patient taking differently: Take 75 mg by mouth daily with breakfast. )     Allergies:   Atenolol, Clonidine hydrochloride, Prednisone, and Verapamil   Social History   Socioeconomic History  . Marital status: Widowed    Spouse name: Not on file  . Number of children: Not on file  . Years of education: Not on file  . Highest education level: Not on file  Occupational History  . Not on file  Tobacco Use  . Smoking status: Former Smoker    Years: 30.00    Types: Cigarettes    Quit date: 04/18/1985    Years since quitting: 34.8  . Smokeless tobacco: Never Used  Substance and Sexual Activity  . Alcohol use: No    Alcohol/week: 0.0 standard drinks  . Drug use: Never  . Sexual activity: Never  Other Topics Concern  . Not on file  Social History Narrative  . Not on file   Social Determinants of Health   Financial Resource Strain:   . Difficulty of Paying Living Expenses:   Food Insecurity:   . Worried About Charity fundraiser in the Last Year:   . Arboriculturist in the Last Year:   Transportation Needs:   . Film/video editor (Medical):   Marland Kitchen Lack of Transportation (Non-Medical):   Physical Activity:   . Days of Exercise per Week:   . Minutes of Exercise per Session:   Stress:   . Feeling of Stress :   Social  Connections:   . Frequency of Communication with Friends and Family:   . Frequency of Social Gatherings with Friends and Family:   . Attends Religious Services:   . Active Member of Clubs or Organizations:   . Attends Archivist Meetings:   Marland Kitchen Marital Status:      Family History: The patient's family history includes Alcohol abuse in his brother and father; Depression in his mother.  ROS:   Please see the history of present illness.    All other systems reviewed and are negative.  EKGs/Labs/Other Studies Reviewed:  The following studies were reviewed today: IMPRESSIONS    1. Left ventricular ejection fraction, by estimation, is 60 to 65%. The  left ventricle has normal function. The left ventricle has no regional  wall motion abnormalities. There is mild left ventricular hypertrophy.  Left ventricular diastolic parameters  were normal.  2. Right ventricular systolic function is normal. The right ventricular  size is normal. There is mildly elevated pulmonary artery systolic  pressure.  3. Left atrial size was moderately dilated.  4. The mitral valve is normal in structure and function. No evidence of  mitral valve regurgitation. No evidence of mitral stenosis.  5. The aortic valve is normal in structure and function. Aortic valve  regurgitation is trivial. No aortic stenosis is present.  6. The inferior vena cava is normal in size with greater than 50%  respiratory variability, suggesting right atrial pressure of 3 mmHg.    IMPRESSION: 1.  No demonstrable pulmonary embolus.  2. Ascending thoracic aortic diameter measures 4.0 x 4.0 cm. No evident dissection. There is extensive aortic atherosclerosis. There are foci of great vessel and coronary artery calcification. Recommend annual imaging followup by CTA or MRA. This recommendation follows 2010 ACCF/AHA/AATS/ACR/ASA/SCA/SCAI/SIR/STS/SVM Guidelines for the Diagnosis and Management of Patients with  Thoracic Aortic Disease. Circulation. 2010; [id number]. Aortic aneurysm NOS (ICD10-I71.9)  3. Prominence of proximal main pulmonary arteries is felt to be indicative of a degree of pulmonary arterial hypertension.  4. Areas of parenchymal fibrosis and scarring bilaterally. Apparent focus of pneumonia in the posterior segment right upper lobe. Small right pleural effusion. This area of pneumonia warrants a follow-up chest radiograph in 3-4 weeks to assess for clearing. The possibility of an underlying mass in this area cannot be entirely excluded.  5.  No adenopathy.  6.  Splenic granulomas.  7.  Benign left adrenal adenomas.  Aortic Atherosclerosis (ICD10-I70.0).  These results will be called to the ordering clinician or representative by the Radiologist Assistant, and communication documented in the PACS or zVision Dashboard.   Electronically Signed   By: Lowella Grip III M.D.   On: 02/10/2020 10:29   Recent Labs: 12/30/2019: B Natriuretic Peptide 435.1 01/04/2020: ALT 18; Magnesium 1.8 01/29/2020: Hemoglobin 11.6; Platelets 354.0 02/05/2020: BUN 20; Creatinine, Ser 1.14; Potassium 4.7; Sodium 137  Recent Lipid Panel    Component Value Date/Time   CHOL 141 12/31/2019 0100   TRIG 61 12/31/2019 0100   HDL 32 (L) 12/31/2019 0100   CHOLHDL 4.4 12/31/2019 0100   VLDL 12 12/31/2019 0100   LDLCALC 97 12/31/2019 0100   LDLDIRECT 155.3 10/06/2010 0850    Physical Exam:    VS:  BP (!) 200/90   Pulse (!) 50   Ht 5\' 7"  (1.702 m)   Wt 195 lb (88.5 kg)   SpO2 91%   BMI 30.54 kg/m     Wt Readings from Last 3 Encounters:  03/03/20 195 lb (88.5 kg)  02/23/20 196 lb 4 oz (89 kg)  02/10/20 195 lb (88.5 kg)     GEN: Patient is in no acute distress HEENT: Normal NECK: No JVD; No carotid bruits LYMPHATICS: No lymphadenopathy CARDIAC: Hear sounds regular, 2/6 systolic murmur at the apex. RESPIRATORY:  Clear to auscultation without rales, wheezing or rhonchi   ABDOMEN: Soft, non-tender, non-distended MUSCULOSKELETAL:  No edema; No deformity  SKIN: Warm and dry NEUROLOGIC:  Alert and oriented x 3 PSYCHIATRIC:  Normal affect   Signed, Jenean Lindau, MD  03/03/2020 4:45 PM    [facility]

## 2020-03-03 NOTE — Patient Instructions (Addendum)
Medication Instructions:  No medication changes *If you need a refill on your cardiac medications before your next appointment, please call your pharmacy*   Lab Work: You need to have labs done when you are fasting.  You can come Monday through Friday 8:30 am to 12:00 pm and 1:15 to 4:30. You do not need to make an appointment as the order has already been placed. The labs you are going to have done are BMET, CBC, TSH, LFT and Lipids.  If you have labs (blood work) drawn today and your tests are completely normal, you will receive your results only by: Marland Kitchen MyChart Message (if you have MyChart) OR . A paper copy in the mail If you have any lab test that is abnormal or we need to change your treatment, we will call you to review the results.   Testing/Procedures: Your physician has requested that you have a lexiscan myoview. For further information please visit [email or web address]. Please follow instruction sheet, as given.  The test will take approximately 3 to 4 hours to complete; you may bring reading material.  If someone comes with you to your appointment, they will need to remain in the main lobby due to limited space in the testing area. **If you are pregnant or breastfeeding, please notify the nuclear lab prior to your appointment**  How to prepare for your Myocardial Perfusion Test: . Do not eat or drink 3 hours prior to your test, except you may have water. . Do not consume products containing caffeine (regular or decaffeinated) 12 hours prior to your test. (ex: coffee, chocolate, sodas, tea). Do not take propranolol (inderal, Innopran) for 24 hours prior to the test.  Bring the medication to your appointment as you may be required to take it once the test is complete. . Do wear comfortable clothes (no dresses or overalls) and walking shoes, tennis shoes preferred (No heels or open toe shoes are allowed). . Do NOT wear cologne, perfume, aftershave, or lotions (deodorant is  allowed). . If these instructions are not followed, your test will have to be rescheduled.    Follow-Up: At [facility], you and your health needs are our priority.  As part of our continuing mission to provide you with exceptional heart care, we have created designated Provider Care Teams.  These Care Teams include your primary Cardiologist (physician) and Advanced Practice Providers (APPs -  Physician Assistants and Nurse Practitioners) who all work together to provide you with the care you need, when you need it.  We recommend signing up for the patient portal called "MyChart".  Sign up information is provided on this After Visit Summary.  MyChart is used to connect with patients for Virtual Visits (Telemedicine).  Patients are able to view lab/test results, encounter notes, upcoming appointments, etc.  Non-urgent messages can be sent to your provider as well.   To learn more about what you can do with MyChart, go to [email or web address].    Your next appointment:   4 month(s)  The format for your next appointment:   In Person  Provider:   Jyl Heinz, MD   Other Instructions NA

## 2020-03-07 ENCOUNTER — Telehealth: Payer: Self-pay | Admitting: Family Medicine

## 2020-03-07 DIAGNOSIS — E1165 Type 2 diabetes mellitus with hyperglycemia: Secondary | ICD-10-CM | POA: Diagnosis not present

## 2020-03-07 DIAGNOSIS — U071 COVID-19: Secondary | ICD-10-CM | POA: Diagnosis not present

## 2020-03-07 DIAGNOSIS — J1282 Pneumonia due to coronavirus disease 2019: Secondary | ICD-10-CM | POA: Diagnosis not present

## 2020-03-07 DIAGNOSIS — I2510 Atherosclerotic heart disease of native coronary artery without angina pectoris: Secondary | ICD-10-CM | POA: Diagnosis not present

## 2020-03-07 DIAGNOSIS — F0390 Unspecified dementia without behavioral disturbance: Secondary | ICD-10-CM | POA: Diagnosis not present

## 2020-03-07 DIAGNOSIS — J9601 Acute respiratory failure with hypoxia: Secondary | ICD-10-CM | POA: Diagnosis not present

## 2020-03-07 NOTE — Telephone Encounter (Signed)
Son in law called in with BP 165/90 (done by PT in the home) He is SOB, (the son in law states he normally is SOB since having Covid). Offered appt with PCP 03/08/2020 Family unable to bring him in until Friday 03/11/2020 Scheduled Friday 03/11/2020 at 10:15 AM. Informed the family to take the patient to ED if symptoms/worsen/changes.  The family member agreed to do so.

## 2020-03-07 NOTE — Telephone Encounter (Signed)
Patient's BP reading is high. Patient asked to call back with reading.   165/90  Please advise

## 2020-03-08 MED FILL — TELMISARTAN 20 MG TABLET: 20 | 30 days supply | Qty: 30 | Fill #1

## 2020-03-09 ENCOUNTER — Other Ambulatory Visit: Payer: Self-pay | Admitting: Family Medicine

## 2020-03-09 DIAGNOSIS — E1165 Type 2 diabetes mellitus with hyperglycemia: Secondary | ICD-10-CM | POA: Diagnosis not present

## 2020-03-09 DIAGNOSIS — I2510 Atherosclerotic heart disease of native coronary artery without angina pectoris: Secondary | ICD-10-CM | POA: Diagnosis not present

## 2020-03-09 DIAGNOSIS — F0390 Unspecified dementia without behavioral disturbance: Secondary | ICD-10-CM | POA: Diagnosis not present

## 2020-03-09 DIAGNOSIS — J1282 Pneumonia due to coronavirus disease 2019: Secondary | ICD-10-CM | POA: Diagnosis not present

## 2020-03-09 DIAGNOSIS — U071 COVID-19: Secondary | ICD-10-CM | POA: Diagnosis not present

## 2020-03-09 DIAGNOSIS — J9601 Acute respiratory failure with hypoxia: Secondary | ICD-10-CM | POA: Diagnosis not present

## 2020-03-10 ENCOUNTER — Other Ambulatory Visit: Payer: Self-pay

## 2020-03-11 ENCOUNTER — Ambulatory Visit (INDEPENDENT_AMBULATORY_CARE_PROVIDER_SITE_OTHER): Payer: Medicare Other | Admitting: Family Medicine

## 2020-03-11 ENCOUNTER — Encounter: Payer: Self-pay | Admitting: Family Medicine

## 2020-03-11 ENCOUNTER — Telehealth (HOSPITAL_COMMUNITY): Payer: Self-pay

## 2020-03-11 ENCOUNTER — Other Ambulatory Visit: Payer: Self-pay

## 2020-03-11 VITALS — BP 154/82 | HR 82 | Temp 97.0°F | Ht 69.5 in | Wt 196.0 lb

## 2020-03-11 DIAGNOSIS — I10 Essential (primary) hypertension: Secondary | ICD-10-CM

## 2020-03-11 MED ORDER — AMLODIPINE BESYLATE 5 MG PO TABS: 5.0000 mg | ORAL_TABLET | Freq: Every day | ORAL | 3 refills | Status: DC

## 2020-03-11 MED FILL — AMLODIPINE BESYLATE 5 MG TA: 5 | 30 days supply | Qty: 30 | Fill #0

## 2020-03-11 NOTE — Progress Notes (Signed)
Chief Complaint  Patient presents with  . Hypertension    Subjective Douglas Grant is a 86 y.o. male who presents for hypertension follow up. Here w SIL.  He does monitor home blood pressures. Blood pressures ranging from 160-200's/90-100's on average. He is compliant with medication- Micardis 20 mg/d. Patient has these side effects of medication: none He is not routinely adhering to a healthy diet overall. Current exercise: some walking, working with PT   Past Medical History:  Diagnosis Date  . Arthritis   . Benign localized prostatic hyperplasia with lower urinary tract symptoms (LUTS)   . DOE (dyspnea on exertion)   . Full dentures   . Gait disturbance   . GERD (gastroesophageal reflux disease)   . Heart murmur   . History of CVA in adulthood 03/2011   acute infarct right lateral thalamus posteriorly in internal capsule,  per pt no residuals  . Hyperlipidemia   . Hypertension   . Hypothyroidism   . Iron deficiency anemia   . Memory difficulty   . Personal history of colonic polyps   . Type 2 diabetes, diet controlled ([place])     Review of Systems Cardiovascular: no chest pain Respiratory:  no shortness of breath  Exam BP (!) 154/82 (BP Location: Left Arm, Patient Position: Sitting, Cuff Size: Normal)   Pulse 82   Temp (!) 97 F (36.1 C) (Temporal)   Ht 5' 9.5" (1.765 m)   Wt 196 lb (88.9 kg)   SpO2 93%   BMI 28.53 kg/m  General:  well developed, well nourished, in no apparent distress Heart: RRR, 2+ LE edema Lungs: clear to auscultation, no accessory muscle use Psych: well oriented with normal range of affect and limited judgment/insight  Essential hypertension - Plan: amLODipine (NORVASC) 5 MG tablet  Add Norvasc. He urinates a lot and is a fall risk so will hold off on diuretic. Cont ARB. Counseled on diet and exercise. F/u in 4 weeks. The patient and his SIL voiced understanding and agreement to the plan.  [place], DO 03/11/20  10:32  AM

## 2020-03-11 NOTE — Patient Instructions (Signed)
Keep the diet clean and stay active.  Continue your other blood pressure medicine (Micardis).  Let us know if you need anything.

## 2020-03-11 NOTE — Telephone Encounter (Signed)
Encounter complete. 

## 2020-03-14 DIAGNOSIS — U071 COVID-19: Secondary | ICD-10-CM | POA: Diagnosis not present

## 2020-03-14 DIAGNOSIS — J1282 Pneumonia due to coronavirus disease 2019: Secondary | ICD-10-CM | POA: Diagnosis not present

## 2020-03-14 DIAGNOSIS — I2510 Atherosclerotic heart disease of native coronary artery without angina pectoris: Secondary | ICD-10-CM | POA: Diagnosis not present

## 2020-03-14 DIAGNOSIS — J9601 Acute respiratory failure with hypoxia: Secondary | ICD-10-CM | POA: Diagnosis not present

## 2020-03-14 DIAGNOSIS — F0390 Unspecified dementia without behavioral disturbance: Secondary | ICD-10-CM | POA: Diagnosis not present

## 2020-03-14 DIAGNOSIS — E1165 Type 2 diabetes mellitus with hyperglycemia: Secondary | ICD-10-CM | POA: Diagnosis not present

## 2020-03-15 ENCOUNTER — Other Ambulatory Visit: Payer: Self-pay | Admitting: Family Medicine

## 2020-03-16 ENCOUNTER — Other Ambulatory Visit: Payer: Self-pay

## 2020-03-16 ENCOUNTER — Ambulatory Visit (HOSPITAL_COMMUNITY)
Admission: RE | Admit: 2020-03-16 | Discharge: 2020-03-16 | Disposition: A | Discharge status: Home / Self Care | Payer: Medicare Other | Source: Ambulatory Visit | Attending: Cardiology | Admitting: Cardiology

## 2020-03-16 ENCOUNTER — Encounter (HOSPITAL_COMMUNITY): Payer: Medicare Other

## 2020-03-16 DIAGNOSIS — I2510 Atherosclerotic heart disease of native coronary artery without angina pectoris: Secondary | ICD-10-CM | POA: Insufficient documentation

## 2020-03-16 LAB — MYOCARDIAL PERFUSION IMAGING
LV dias vol: 123 mL (ref 62–150)
LV sys vol: 61 mL
Peak HR: 88 {beats}/min
Rest HR: 63 {beats}/min
SDS: 2
SRS: 3
SSS: 5
TID: 0.99

## 2020-03-16 MED ORDER — TECHNETIUM TC 99M TETROFOSMIN IV KIT
30.3000 | PACK | Freq: Once | INTRAVENOUS | Status: AC | PRN
  Administered 2020-03-16: 30.3 via INTRAVENOUS
  Filled 2020-03-16: qty 31

## 2020-03-16 MED ORDER — TECHNETIUM TC 99M TETROFOSMIN IV KIT
10.1000 | PACK | Freq: Once | INTRAVENOUS | Status: AC | PRN
  Administered 2020-03-16: 10.1 via INTRAVENOUS
  Filled 2020-03-16: qty 11

## 2020-03-16 MED ORDER — REGADENOSON 0.4 MG/5ML IV SOLN
0.4000 mg | Freq: Once | INTRAVENOUS | Status: AC
  Administered 2020-03-16: 0.4 mg via INTRAVENOUS

## 2020-03-17 DIAGNOSIS — U071 COVID-19: Secondary | ICD-10-CM | POA: Diagnosis not present

## 2020-03-17 DIAGNOSIS — J1282 Pneumonia due to coronavirus disease 2019: Secondary | ICD-10-CM | POA: Diagnosis not present

## 2020-03-17 DIAGNOSIS — J9601 Acute respiratory failure with hypoxia: Secondary | ICD-10-CM | POA: Diagnosis not present

## 2020-03-17 DIAGNOSIS — I2510 Atherosclerotic heart disease of native coronary artery without angina pectoris: Secondary | ICD-10-CM | POA: Diagnosis not present

## 2020-03-17 DIAGNOSIS — E1165 Type 2 diabetes mellitus with hyperglycemia: Secondary | ICD-10-CM | POA: Diagnosis not present

## 2020-03-17 DIAGNOSIS — F0390 Unspecified dementia without behavioral disturbance: Secondary | ICD-10-CM | POA: Diagnosis not present

## 2020-03-18 ENCOUNTER — Telehealth: Payer: Self-pay | Admitting: Family Medicine

## 2020-03-18 ENCOUNTER — Telehealth: Payer: Self-pay

## 2020-03-18 NOTE — Telephone Encounter (Signed)
Noted  

## 2020-03-18 NOTE — Telephone Encounter (Signed)
Family and [facility]  wanted to let wendling know that patients BP Reading has been elevated.. last reading today  Was 175/90 and  165/88 today before and after treatment.

## 2020-03-18 NOTE — Telephone Encounter (Signed)
Spoke with [name] pt's daughter (per DPR) with results of the [place] and [place]. She verbalized understanding and had no additional questions.

## 2020-03-21 DIAGNOSIS — J1282 Pneumonia due to coronavirus disease 2019: Secondary | ICD-10-CM | POA: Diagnosis not present

## 2020-03-21 DIAGNOSIS — U071 COVID-19: Secondary | ICD-10-CM | POA: Diagnosis not present

## 2020-03-21 DIAGNOSIS — J9601 Acute respiratory failure with hypoxia: Secondary | ICD-10-CM | POA: Diagnosis not present

## 2020-03-21 DIAGNOSIS — E1165 Type 2 diabetes mellitus with hyperglycemia: Secondary | ICD-10-CM | POA: Diagnosis not present

## 2020-03-21 DIAGNOSIS — I2510 Atherosclerotic heart disease of native coronary artery without angina pectoris: Secondary | ICD-10-CM | POA: Diagnosis not present

## 2020-03-21 DIAGNOSIS — F0390 Unspecified dementia without behavioral disturbance: Secondary | ICD-10-CM | POA: Diagnosis not present

## 2020-03-23 DIAGNOSIS — J9601 Acute respiratory failure with hypoxia: Secondary | ICD-10-CM | POA: Diagnosis not present

## 2020-03-23 DIAGNOSIS — F0390 Unspecified dementia without behavioral disturbance: Secondary | ICD-10-CM | POA: Diagnosis not present

## 2020-03-23 DIAGNOSIS — U071 COVID-19: Secondary | ICD-10-CM | POA: Diagnosis not present

## 2020-03-23 DIAGNOSIS — J1282 Pneumonia due to coronavirus disease 2019: Secondary | ICD-10-CM | POA: Diagnosis not present

## 2020-03-23 DIAGNOSIS — I2510 Atherosclerotic heart disease of native coronary artery without angina pectoris: Secondary | ICD-10-CM | POA: Diagnosis not present

## 2020-03-23 DIAGNOSIS — E1165 Type 2 diabetes mellitus with hyperglycemia: Secondary | ICD-10-CM | POA: Diagnosis not present

## 2020-03-29 DIAGNOSIS — F0390 Unspecified dementia without behavioral disturbance: Secondary | ICD-10-CM | POA: Diagnosis not present

## 2020-03-29 DIAGNOSIS — J9601 Acute respiratory failure with hypoxia: Secondary | ICD-10-CM | POA: Diagnosis not present

## 2020-03-29 DIAGNOSIS — J1282 Pneumonia due to coronavirus disease 2019: Secondary | ICD-10-CM | POA: Diagnosis not present

## 2020-03-29 DIAGNOSIS — U071 COVID-19: Secondary | ICD-10-CM | POA: Diagnosis not present

## 2020-03-29 DIAGNOSIS — I2510 Atherosclerotic heart disease of native coronary artery without angina pectoris: Secondary | ICD-10-CM | POA: Diagnosis not present

## 2020-03-29 DIAGNOSIS — E1165 Type 2 diabetes mellitus with hyperglycemia: Secondary | ICD-10-CM | POA: Diagnosis not present

## 2020-03-30 DIAGNOSIS — M79672 Pain in left foot: Secondary | ICD-10-CM | POA: Diagnosis not present

## 2020-03-30 DIAGNOSIS — E1159 Type 2 diabetes mellitus with other circulatory complications: Secondary | ICD-10-CM | POA: Diagnosis not present

## 2020-03-30 DIAGNOSIS — B351 Tinea unguium: Secondary | ICD-10-CM | POA: Diagnosis not present

## 2020-03-30 DIAGNOSIS — M79671 Pain in right foot: Secondary | ICD-10-CM | POA: Diagnosis not present

## 2020-04-05 ENCOUNTER — Ambulatory Visit: Payer: Medicare Other | Admitting: Family Medicine

## 2020-04-11 ENCOUNTER — Telehealth: Payer: Self-pay

## 2020-04-11 DIAGNOSIS — I10 Essential (primary) hypertension: Secondary | ICD-10-CM

## 2020-04-11 MED ORDER — AMLODIPINE BESYLATE 5 MG PO TABS: 5.0000 mg | ORAL_TABLET | Freq: Every day | ORAL | 3 refills | Status: DC

## 2020-04-11 MED ORDER — TELMISARTAN 20 MG PO TABS: 20.0000 mg | ORAL_TABLET | Freq: Every day | ORAL | 3 refills | Status: DC

## 2020-04-11 MED FILL — TELMISARTAN 20 MG TABLET: 20 | 30 days supply | Qty: 30 | Fill #2

## 2020-04-11 MED FILL — AMLODIPINE BESYLATE 5 MG TA: 5 | 30 days supply | Qty: 30 | Fill #1

## 2020-04-11 NOTE — Telephone Encounter (Signed)
Refills done as requested

## 2020-04-11 NOTE — Telephone Encounter (Signed)
Patient daughter called in to advised going forward that  She  would like for the following two prescriptions  Be added to   [facility] [place]  [address], [place] [address]  Phone:  [phone] Fax:  [phone]  DEA #:  --   telmisartan (MICARDIS) 20 MG tablet [id number]    amLODipine (NORVASC) 5 MG tablet [id number]

## 2020-04-19 ENCOUNTER — Other Ambulatory Visit: Payer: Self-pay | Admitting: Family Medicine

## 2020-04-26 ENCOUNTER — Ambulatory Visit: Payer: Medicare Other | Admitting: Family Medicine

## 2020-04-29 ENCOUNTER — Ambulatory Visit: Payer: Medicare Other | Admitting: Family Medicine

## 2020-05-02 ENCOUNTER — Other Ambulatory Visit: Payer: Self-pay | Admitting: *Deleted

## 2020-05-02 ENCOUNTER — Other Ambulatory Visit: Payer: Self-pay | Admitting: Family Medicine

## 2020-05-02 NOTE — Patient Outreach (Signed)
[place] [facility]) Care Management  05/02/2020  Douglas Grant 08/18/1930 [id number]   Telephone Assessment-Transition to Health Coach (HTN management)  RN spoke with pt's daughter [name] with an update on pt's ongoing management of care. Daughter reports pt recent prescribed another BP medication to control his elevated BP. Reports it is lower and continue to improve. RN inquired if pt could be managed by a health coach related to his HTN. Daughter receptive as Therapist, sports verified all goals have been met related to pt's safety and no additional falls or related injuries. Also verify pt's diabetes and CHF are continue to be managed with no encountered problems. Will close current plan of care, notify Dr. Nani Ravens of pt's transition to a Health Coach for ongoing [facility] disease management of care at this time. No other inquires or request as daughter was very appreciative for the calls and management of care offered for ongoing services. Reminder caregiver that [facility] continue to officer social work and pharmacy assistance when needed.  Raina Mina, RN Care Management Coordinator [place] Office [phone]

## 2020-05-12 ENCOUNTER — Other Ambulatory Visit: Payer: Self-pay | Admitting: *Deleted

## 2020-05-12 NOTE — Patient Outreach (Signed)
[place] [facility]) Care Management  05/12/2020  Douglas Grant 03/01/1930 [id number]  Unsuccessful outreach attempt made to patient's daughter Ms. [name]; verified designated party release form. Ms. [name] answered the phone and explained that she could not speak at this time. She asked that the nurse call her back tomorrow.   Plan: RN Health Coach will call patient's daughter 05/13/20.   [name] RN, BSN [place] [phone] [email or web address]

## 2020-05-13 ENCOUNTER — Other Ambulatory Visit: Payer: Self-pay | Admitting: *Deleted

## 2020-05-13 NOTE — Patient Outreach (Signed)
[place] [facility]) Care Management  05/13/2020  DANTA CHAMBER 11/06/1930 [id number]  Outreach attempt to patient's daughter. No answer and unable to leave voicemail message due to voice mailbox is full.  Plan: RN Health Coach will call patient's daughter within the month June of and patient agrees to future outreach calls.   [name] RN, BSN [place] [phone] [email or web address]

## 2020-05-20 ENCOUNTER — Ambulatory Visit: Payer: Medicare Other | Admitting: Family Medicine

## 2020-06-06 ENCOUNTER — Encounter: Payer: Self-pay | Admitting: Family Medicine

## 2020-06-06 ENCOUNTER — Other Ambulatory Visit: Payer: Self-pay

## 2020-06-06 ENCOUNTER — Ambulatory Visit (INDEPENDENT_AMBULATORY_CARE_PROVIDER_SITE_OTHER): Payer: Medicare Other | Admitting: Family Medicine

## 2020-06-06 VITALS — BP 136/86 | HR 88 | Temp 96.9°F | Ht 69.5 in | Wt 204.1 lb

## 2020-06-06 DIAGNOSIS — F0390 Unspecified dementia without behavioral disturbance: Secondary | ICD-10-CM | POA: Diagnosis not present

## 2020-06-06 DIAGNOSIS — R443 Hallucinations, unspecified: Secondary | ICD-10-CM

## 2020-06-06 DIAGNOSIS — E669 Obesity, unspecified: Secondary | ICD-10-CM

## 2020-06-06 DIAGNOSIS — E1169 Type 2 diabetes mellitus with other specified complication: Secondary | ICD-10-CM | POA: Diagnosis not present

## 2020-06-06 DIAGNOSIS — B353 Tinea pedis: Secondary | ICD-10-CM | POA: Diagnosis not present

## 2020-06-06 MED ORDER — KETOCONAZOLE 2 % EX CREA: 1.0000 "application " | TOPICAL_CREAM | Freq: Every day | CUTANEOUS | 0 refills | Status: AC

## 2020-06-06 MED ORDER — QUETIAPINE FUMARATE ER 150 MG PO TB24: 150.0000 mg | ORAL_TABLET | Freq: Every day | ORAL | 2 refills | Status: DC

## 2020-06-06 MED FILL — KETOCONAZOLE 2% CREAM: 2 | 15 days supply | Qty: 60 | Fill #0

## 2020-06-06 MED FILL — QUETIAPINE ER 150 MG TABLET: 150 | 30 days supply | Qty: 30 | Fill #0

## 2020-06-06 NOTE — Patient Instructions (Addendum)
Give [place] 2-3 business days to get the results of your labs back.   Keep the diet clean and stay active.  Use the cream daily.   Come to your next appointment ready to pee and give blood before you see me.  Let us know if you need anything.

## 2020-06-06 NOTE — Progress Notes (Signed)
Subjective:   Chief Complaint  Patient presents with  . Follow-up    Douglas Grant is a 86 y.o. male here for follow-up of diabetes.  Here w SIL who helps provide the hx.  [place] does not monitor his sugars at home.  Patient does not require insulin.   Medications include: diet controlled Exercise: some walking Diet is OK. Due for eye exam.  Patient has a history of dementia without behavioral disturbances.  He also has a history of hallucinations.  He will think his son-in-law will wake him up at 3 in the morning even though he does not come over until 7 AM.  He also sees flies/bugs floating around the dinner table and in his general vicinity.  No one else sees these.  He did see them in the exam room today.  He is currently on Aricept 10 mg daily, Namenda 5 mg twice daily and Seroquel 100 mg daily. He does not see other things and has no auditory hallucinations/receive instructions.   Past Medical History:  Diagnosis Date  . Arthritis   . Benign localized prostatic hyperplasia with lower urinary tract symptoms (LUTS)   . DOE (dyspnea on exertion)   . Full dentures   . Gait disturbance   . GERD (gastroesophageal reflux disease)   . Heart murmur   . History of CVA in adulthood 03/2011   acute infarct right lateral thalamus posteriorly in internal capsule,  per pt no residuals  . Hyperlipidemia   . Hypertension   . Hypothyroidism   . Iron deficiency anemia   . Memory difficulty   . Personal history of colonic polyps   . Type 2 diabetes, diet controlled ([place])      Related testing: Date of retinal exam: Due Pneumovax: done  Objective:  BP 136/86 (BP Location: Left Arm, Patient Position: Sitting, Cuff Size: Normal)   Pulse 88   Temp (!) 96.9 F (36.1 C) (Temporal)   Ht 5' 9.5" (1.765 m)   Wt 204 lb 2 oz (92.6 kg)   SpO2 93%   BMI 29.71 kg/m  General:  Well developed, well nourished, in no apparent distress Skin:  Macerated tissue between toes b/l, moccasin  distribution of scaly tissue Head:  Normocephalic, atraumatic Eyes:  Pupils equal and round, sclera anicteric without injection  Lungs:  CTAB, no access msc use Cardio:  RRR, no bruits, bilateral 2+ LE edema Musculoskeletal:  Symmetrical muscle groups noted without atrophy or deformity Neuro:  Sensation intact to pinprick on feet; Alert to self; knows it is a doctor's office but not my name or group; year- '65, unsure of month, knows the day; does not know the president Psych: Limited judgment and insight  Assessment:   Diabetes mellitus type 2 in obese ([place]) - Plan: Hemoglobin A1c, Ambulatory referral to Ophthalmology, Microalbumin / creatinine urine ratio, Hemoglobin A1c  Dementia without behavioral disturbance, unspecified dementia type ([place]) - Plan: QUEtiapine Fumarate (SEROQUEL XR) 150 MG 24 hr tablet  Hallucination - Plan: QUEtiapine Fumarate (SEROQUEL XR) 150 MG 24 hr tablet  Tinea pedis of both feet - Plan: ketoconazole (NIZORAL) 2 % cream   Plan:   1- Ck above labs. Counseled on diet and exercise. Refer to ophtho. Rec'd covid vaccine.  2- Increase Seroquel from 100 mg/d to 150 mg/d.  F/u in 6 weeks. Labs prior to visit.  The patient voiced understanding and agreement to the plan.  [place], DO 06/06/20 4:28 PM

## 2020-06-13 ENCOUNTER — Other Ambulatory Visit: Payer: Self-pay | Admitting: *Deleted

## 2020-06-13 NOTE — Patient Outreach (Signed)
[place] [facility]) Care Management  06/13/2020  CHEYENNE SCHUMM [birth year] [id number]  Unsuccessful outreach attempt made to patient. RN Health Coach left HIPAA compliant voicemail message along with her contact information.  Plan: RN Health Coach will call patient within the month of July and will send unsuccessful letter.  [name] RN, BSN [place] [phone] [email or web address]

## 2020-06-29 MED FILL — QUETIAPINE ER 150 MG TABLET: 150 | 30 days supply | Qty: 30 | Fill #1

## 2020-07-18 ENCOUNTER — Encounter: Payer: Self-pay | Admitting: Cardiology

## 2020-07-18 ENCOUNTER — Ambulatory Visit (INDEPENDENT_AMBULATORY_CARE_PROVIDER_SITE_OTHER): Payer: Medicare Other | Admitting: Cardiology

## 2020-07-18 ENCOUNTER — Other Ambulatory Visit: Payer: Self-pay

## 2020-07-18 VITALS — BP 130/62 | HR 84 | Ht 69.5 in | Wt 205.0 lb

## 2020-07-18 DIAGNOSIS — E119 Type 2 diabetes mellitus without complications: Secondary | ICD-10-CM | POA: Diagnosis not present

## 2020-07-18 DIAGNOSIS — I10 Essential (primary) hypertension: Secondary | ICD-10-CM | POA: Diagnosis not present

## 2020-07-18 DIAGNOSIS — E782 Mixed hyperlipidemia: Secondary | ICD-10-CM

## 2020-07-18 DIAGNOSIS — I2510 Atherosclerotic heart disease of native coronary artery without angina pectoris: Secondary | ICD-10-CM | POA: Diagnosis not present

## 2020-07-18 DIAGNOSIS — I712 Thoracic aortic aneurysm, without rupture: Secondary | ICD-10-CM

## 2020-07-18 DIAGNOSIS — I7121 Aneurysm of the ascending aorta, without rupture: Secondary | ICD-10-CM

## 2020-07-18 HISTORY — DX: Aneurysm of the ascending aorta, without rupture: I71.21

## 2020-07-18 HISTORY — DX: Thoracic aortic aneurysm, without rupture: I71.2

## 2020-07-18 NOTE — Progress Notes (Signed)
Cardiology Office Note:    Date:  07/18/2020   ID:  Douglas Grant, DOB Nov 09, 1930, MRN [id number]  PCP:  Shelda Pal, DO  Cardiologist:  Jenean Lindau, MD   Referring MD: Shelda Pal*    ASSESSMENT:    1. Atherosclerosis of native coronary artery of native heart without angina pectoris   2. Essential hypertension   3. Mixed hyperlipidemia   4. Controlled type 2 diabetes mellitus without complication, without long-term current use of insulin ([place])   5. Ascending aortic aneurysm (HCC)    PLAN:    In order of problems listed above:  1. Coronary artery disease: Secondary prevention stressed with the patient.  Importance of compliance with diet medication stressed and he vocalized understanding. 2. Hypertension: His blood pressure is stable. 3. Mixed dyslipidemia: Diet was emphasized lipids were reviewed weight reduction was stressed.  He was advised to ambulate on a regular basis and he promises to do so.  His blood work was reviewed. 4. Patient will be seen in follow-up appointment in 6 months or earlier if the patient has any concerns    Medication Adjustments/Labs and Tests Ordered: Current medicines are reviewed at length with the patient today.  Concerns regarding medicines are outlined above.  No orders of the defined types were placed in this encounter.  No orders of the defined types were placed in this encounter.    Chief Complaint  Patient presents with  . Follow-up     History of Present Illness:    Douglas Grant is a 86 y.o. male.  Patient has past medical history of coronary artery disease, ascending aortic aneurysm, essential hypertension and dyslipidemia.  He denies any problems at this time and takes care of activities of daily living.  No chest pain orthopnea or PND.  He is happy with quality of life.  He has a supportive son-in-law who accompanies him for the results.  Past Medical History:  Diagnosis Date  . Arthritis   .  Benign localized prostatic hyperplasia with lower urinary tract symptoms (LUTS)   . DOE (dyspnea on exertion)   . Full dentures   . Gait disturbance   . GERD (gastroesophageal reflux disease)   . Heart murmur   . History of CVA in adulthood 03/2011   acute infarct right lateral thalamus posteriorly in internal capsule,  per pt no residuals  . Hyperlipidemia   . Hypertension   . Hypothyroidism   . Iron deficiency anemia   . Memory difficulty   . Personal history of colonic polyps   . Type 2 diabetes, diet controlled (Douglas Grant)     Past Surgical History:  Procedure Laterality Date  . CATARACT EXTRACTION W/ INTRAOCULAR LENS  IMPLANT, BILATERAL  2017  . CIRCUMCISION N/A 10/02/2018   Procedure: CIRCUMCISION ADULT;  Surgeon: [name], MD;  Location: [facility];  Service: Urology;  Laterality: N/A;  . HERNIA REPAIR    . LAPAROSCOPIC CHOLECYSTECTOMY  early 2000s    Current Medications: Current Meds  Medication Sig  . alfuzosin (UROXATRAL) 10 MG 24 hr tablet TAKE 1 TABLET DAILY  . amLODipine (NORVASC) 5 MG tablet Take 1 tablet (5 mg total) by mouth daily.  . cetirizine (ZYRTEC) 10 MG tablet Take 1 tablet (10 mg total) by mouth daily.  . Cyanocobalamin (B-12) 1000 MCG TABS Take 1,000 mcg by mouth daily.  Marland Kitchen donepezil (ARICEPT) 10 MG tablet TAKE 1 TABLET AT BEDTIME  . Ferrous Sulfate Dried (EQ SLOW-RELEASE IRON) 45 MG TBCR  Take 45 mg by mouth 2 (two) times daily.  . finasteride (PROSCAR) 5 MG tablet TAKE 1 TABLET DAILY  . ketoconazole (NIZORAL) 2 % cream Apply 1 application topically daily.  Marland Kitchen levothyroxine (SYNTHROID) 75 MCG tablet TAKE 1 TABLET DAILY  . memantine (NAMENDA) 5 MG tablet Take 1 tablet (5 mg total) by mouth 2 (two) times daily. 1 tab twice daily.  Marland Kitchen omeprazole (PRILOSEC) 40 MG capsule TAKE 1 CAPSULE DAILY  . pravastatin (PRAVACHOL) 40 MG tablet TAKE 1 TABLET DAILY  . propranolol ER (INDERAL LA) 60 MG 24 hr capsule TAKE 1 CAPSULE DAILY (Patient taking differently: Take 60 mg by  mouth at bedtime. )  . QUEtiapine Fumarate (SEROQUEL XR) 150 MG 24 hr tablet Take 1 tablet (150 mg total) by mouth at bedtime.  Marland Kitchen telmisartan (MICARDIS) 20 MG tablet Take 1 tablet (20 mg total) by mouth daily.  Marland Kitchen venlafaxine XR (EFFEXOR-XR) 75 MG 24 hr capsule TAKE 1 CAPSULE DAILY WITH BREAKFAST     Allergies:   Atenolol, Clonidine hydrochloride, Prednisone, and Verapamil   Social History   Socioeconomic History  . Marital status: Widowed    Spouse name: Not on file  . Number of children: Not on file  . Years of education: Not on file  . Highest education level: Not on file  Occupational History  . Not on file  Tobacco Use  . Smoking status: Former Smoker    Years: 30.00    Types: Cigarettes    Quit date: 04/18/1985    Years since quitting: 35.2  . Smokeless tobacco: Never Used  Vaping Use  . Vaping Use: Never used  Substance and Sexual Activity  . Alcohol use: No    Alcohol/week: 0.0 standard drinks  . Drug use: Never  . Sexual activity: Never  Other Topics Concern  . Not on file  Social History Narrative  . Not on file   Social Determinants of Health   Financial Resource Strain:   . Difficulty of Paying Living Expenses:   Food Insecurity:   . Worried About Charity fundraiser in the Last Year:   . Arboriculturist in the Last Year:   Transportation Needs:   . Film/video editor (Medical):   Marland Kitchen Lack of Transportation (Non-Medical):   Physical Activity:   . Days of Exercise per Week:   . Minutes of Exercise per Session:   Stress:   . Feeling of Stress :   Social Connections:   . Frequency of Communication with Friends and Family:   . Frequency of Social Gatherings with Friends and Family:   . Attends Religious Services:   . Active Member of Clubs or Organizations:   . Attends Archivist Meetings:   Marland Kitchen Marital Status:      Family History: The patient's family history includes Alcohol abuse in his brother and father; Depression in his  mother.  ROS:   Please see the history of present illness.    All other systems reviewed and are negative.  EKGs/Labs/Other Studies Reviewed:    The following studies were reviewed today: Study Highlights   The left ventricular ejection fraction is mildly decreased (45-54%).  Nuclear stress EF: 50%.  There was no ST segment deviation noted during stress.  The study is normal.  This is a low risk study.      Recent Labs: 12/30/2019: B Natriuretic Peptide 435.1 01/04/2020: ALT 18; Magnesium 1.8 01/29/2020: Hemoglobin 11.6; Platelets 354.0 02/05/2020: BUN 20; Creatinine, Ser 1.14;  Potassium 4.7; Sodium 137  Recent Lipid Panel    Component Value Date/Time   CHOL 141 12/31/2019 0100   TRIG 61 12/31/2019 0100   HDL 32 (L) 12/31/2019 0100   CHOLHDL 4.4 12/31/2019 0100   VLDL 12 12/31/2019 0100   LDLCALC 97 12/31/2019 0100   LDLDIRECT 155.3 10/06/2010 0850    Physical Exam:    VS:  BP (!) 130/62 (BP Location: Right Arm, Patient Position: Sitting, Cuff Size: Normal)   Pulse 84   Ht 5' 9.5" (1.765 m)   Wt (!) 205 lb (93 kg)   SpO2 91%   BMI 29.84 kg/m     Wt Readings from Last 3 Encounters:  07/18/20 (!) 205 lb (93 kg)  06/06/20 204 lb 2 oz (92.6 kg)  03/16/20 195 lb (88.5 kg)     GEN: Patient is in no acute distress HEENT: Normal NECK: No JVD; No carotid bruits LYMPHATICS: No lymphadenopathy CARDIAC: Hear sounds regular, 2/6 systolic murmur at the apex. RESPIRATORY:  Clear to auscultation without rales, wheezing or rhonchi  ABDOMEN: Soft, non-tender, non-distended MUSCULOSKELETAL:  No edema; No deformity  SKIN: Warm and dry NEUROLOGIC:  Alert and oriented x 3 PSYCHIATRIC:  Normal affect   Signed, Jenean Lindau, MD  07/18/2020 9:21 AM    [place]

## 2020-07-18 NOTE — Patient Instructions (Signed)
Medication Instructions:  Your physician recommends that you continue on your current medications as directed. Please refer to the Current Medication list given to you today.  *If you need a refill on your cardiac medications before your next appointment, please call your pharmacy*   Lab Work: None ordered  If you have labs (blood work) drawn today and your tests are completely normal, you will receive your results only by: Marland Kitchen MyChart Message (if you have MyChart) OR . A paper copy in the mail If you have any lab test that is abnormal or we need to change your treatment, we will call you to review the results.   Testing/Procedures: None ordered   Follow-Up: At [facility], you and your health needs are our priority.  As part of our continuing mission to provide you with exceptional heart care, we have created designated Provider Care Teams.  These Care Teams include your primary Cardiologist (physician) and Advanced Practice Providers (APPs -  Physician Assistants and Nurse Practitioners) who all work together to provide you with the care you need, when you need it.  We recommend signing up for the patient portal called "MyChart".  Sign up information is provided on this After Visit Summary.  MyChart is used to connect with patients for Virtual Visits (Telemedicine).  Patients are able to view lab/test results, encounter notes, upcoming appointments, etc.  Non-urgent messages can be sent to your provider as well.   To learn more about what you can do with MyChart, go to [email or web address].    Your next appointment:   6 month(s)  The format for your next appointment:   In Person  Provider:   Jyl Heinz, MD   Other Instructions

## 2020-07-21 ENCOUNTER — Other Ambulatory Visit: Payer: Self-pay | Admitting: *Deleted

## 2020-07-21 NOTE — Patient Outreach (Signed)
[facility]) Care Management  07/21/2020  Douglas Grant 1930-04-06 [id number]  Multiple attempts to establish contact patient's daughter without success. No response from letters mailed to patient and/or daughter. Case is being closed at this time.      Plan: RN Health Coach will close case at this time.  [name] RN, BSN [place] [phone] [email or web address]

## 2020-07-26 NOTE — Progress Notes (Signed)
Subjective:   Douglas Grant is a 86 y.o. male who presents for Medicare Annual/Subsequent preventive examination. Accompanied by son-in-law.  Review of Systems    Cardiac Risk Factors include: advanced age (>55men, >64 women);male gender;diabetes mellitus;dyslipidemia     Objective:    Today's Vitals   07/27/20 0941  BP: 138/60  Pulse: 74  Temp: 98.3 F (36.8 C)  SpO2: 93%  Weight: 206 lb (93.4 kg)  Height: 5' 9.5" (1.765 m)   Body mass index is 29.98 kg/m.  Advanced Directives 07/27/2020 02/11/2020 12/29/2019 11/24/2018 10/02/2018 09/26/2018 08/31/2018  Does Patient Have a Medical Advance Directive? No No No No No No No  Type of Advance Directive - - - - - - -  Does patient want to make changes to medical advance directive? - - - - - - -  Copy of [place] in Chart? - - - - - - -  Would patient like information on creating a medical advance directive? No - Patient declined - No - Guardian declined Yes (MAU/Ambulatory/Procedural Areas - Information given) No - Patient declined - No - Patient declined    Current Medications (verified) Outpatient Encounter Medications as of 07/27/2020  Medication Sig  . alfuzosin (UROXATRAL) 10 MG 24 hr tablet TAKE 1 TABLET DAILY  . amLODipine (NORVASC) 5 MG tablet Take 1 tablet (5 mg total) by mouth daily.  . cetirizine (ZYRTEC) 10 MG tablet Take 1 tablet (10 mg total) by mouth daily.  . Cyanocobalamin (B-12) 1000 MCG TABS Take 1,000 mcg by mouth daily.  Marland Kitchen donepezil (ARICEPT) 10 MG tablet TAKE 1 TABLET AT BEDTIME  . Ferrous Sulfate Dried (EQ SLOW-RELEASE IRON) 45 MG TBCR Take 45 mg by mouth 2 (two) times daily.  . finasteride (PROSCAR) 5 MG tablet TAKE 1 TABLET DAILY  . levothyroxine (SYNTHROID) 75 MCG tablet TAKE 1 TABLET DAILY  . memantine (NAMENDA) 5 MG tablet Take 1 tablet (5 mg total) by mouth 2 (two) times daily. 1 tab twice daily.  Marland Kitchen omeprazole (PRILOSEC) 40 MG capsule TAKE 1 CAPSULE DAILY  . pravastatin (PRAVACHOL) 40 MG  tablet TAKE 1 TABLET DAILY  . propranolol ER (INDERAL LA) 60 MG 24 hr capsule TAKE 1 CAPSULE DAILY (Patient taking differently: Take 60 mg by mouth at bedtime. )  . QUEtiapine (SEROQUEL XR) 200 MG 24 hr tablet Take 1 tablet (200 mg total) by mouth at bedtime.  Marland Kitchen telmisartan (MICARDIS) 20 MG tablet Take 1 tablet (20 mg total) by mouth daily.  Marland Kitchen venlafaxine XR (EFFEXOR-XR) 75 MG 24 hr capsule TAKE 1 CAPSULE DAILY WITH BREAKFAST  . [DISCONTINUED] QUEtiapine Fumarate (SEROQUEL XR) 150 MG 24 hr tablet Take 1 tablet (150 mg total) by mouth at bedtime.   No facility-administered encounter medications on file as of 07/27/2020.    Allergies (verified) Atenolol, Clonidine hydrochloride, Prednisone, and Verapamil   History: Past Medical History:  Diagnosis Date  . Arthritis   . Benign localized prostatic hyperplasia with lower urinary tract symptoms (LUTS)   . DOE (dyspnea on exertion)   . Full dentures   . Gait disturbance   . GERD (gastroesophageal reflux disease)   . Heart murmur   . History of CVA in adulthood 03/2011   acute infarct right lateral thalamus posteriorly in internal capsule,  per pt no residuals  . Hyperlipidemia   . Hypertension   . Hypothyroidism   . Iron deficiency anemia   . Memory difficulty   . Personal history of colonic polyps   .  Type 2 diabetes, diet controlled ([place])    Past Surgical History:  Procedure Laterality Date  . CATARACT EXTRACTION W/ INTRAOCULAR LENS  IMPLANT, BILATERAL  2017  . CIRCUMCISION N/A 10/02/2018   Procedure: CIRCUMCISION ADULT;  Surgeon: [name], MD;  Location: [facility];  Service: Urology;  Laterality: N/A;  . HERNIA REPAIR    . LAPAROSCOPIC CHOLECYSTECTOMY  early 2000s   Family History  Problem Relation Age of Onset  . Depression Mother   . Alcohol abuse Father   . Alcohol abuse Brother    Social History   Socioeconomic History  . Marital status: Widowed    Spouse name: Not on file  . Number of children: Not on file  . Years  of education: Not on file  . Highest education level: Not on file  Occupational History  . Not on file  Tobacco Use  . Smoking status: Former Smoker    Years: 30.00    Types: Cigarettes    Quit date: 04/18/1985    Years since quitting: 35.2  . Smokeless tobacco: Never Used  Vaping Use  . Vaping Use: Never used  Substance and Sexual Activity  . Alcohol use: No    Alcohol/week: 0.0 standard drinks  . Drug use: Never  . Sexual activity: Never  Other Topics Concern  . Not on file  Social History Narrative  . Not on file   Social Determinants of Health   Financial Resource Strain: Low Risk   . Difficulty of Paying Living Expenses: Not hard at all  Food Insecurity: No Food Insecurity  . Worried About Charity fundraiser in the Last Year: Never true  . Ran Out of Food in the Last Year: Never true  Transportation Needs: No Transportation Needs  . Lack of Transportation (Medical): No  . Lack of Transportation (Non-Medical): No  Physical Activity:   . Days of Exercise per Week:   . Minutes of Exercise per Session:   Stress:   . Feeling of Stress :   Social Connections:   . Frequency of Communication with Friends and Family:   . Frequency of Social Gatherings with Friends and Family:   . Attends Religious Services:   . Active Member of Clubs or Organizations:   . Attends Archivist Meetings:   Marland Kitchen Marital Status:     Tobacco Counseling Counseling given: Not Answered   Clinical Intake: Pain : No/denies pain  Activities of Daily Living In your present state of health, do you have any difficulty performing the following activities: 07/27/2020 02/11/2020  Hearing? N N  Vision? N N  Difficulty concentrating or making decisions? Y N  Walking or climbing stairs? N N  Dressing or bathing? N N  Doing errands, shopping? Y Y  Comment - received assistance with this task  Preparing Food and eating ? Y N  Using the Toilet? N N  In the past six months, have you accidently  leaked urine? N N  Do you have problems with loss of bowel control? N N  Managing your Medications? Y Y  Comment - receives assistance with this task  Managing your Finances? Y Y  Comment - assistance with this task  Housekeeping or managing your Housekeeping? Tempie Donning  Comment - family assist with this task  Some recent data might be hidden    Patient Care Team: Shelda Pal, DO as PCP - General (Family Medicine) Marygrace Drought, MD as Consulting Physician (Ophthalmology)  Indicate any recent Medical Services  you may have received from other than [facility] providers in the past year (date may be approximate).     Assessment:   This is a routine wellness examination for [place].   Dietary issues and exercise activities discussed: Current Exercise Habits: The patient does not participate in regular exercise at present, Exercise limited by: None identified Diet (meal preparation, eat out, water intake, caffeinated beverages, dairy products, fruits and vegetables): in general, a "healthy" diet         Goals    . Maintain safety at home w/ help of family      Depression Screen PHQ 2/9 Scores 07/27/2020 02/11/2020 11/24/2018 10/31/2017 09/13/2016 06/12/2016 11/16/2014  PHQ - 2 Score 0 0 0 0 0 0 0    Fall Risk Fall Risk  07/27/2020 02/11/2020 11/24/2018 11/13/2018 10/31/2017  Falls in the past year? 0 1 1 0 No  Comment - - - Emmi Telephone Survey: data to providers prior to load -  Number falls in past yr: 0 1 1 - -  Injury with Fall? 0 0 0 - -  Risk for fall due to : - Impaired balance/gait - - -  Follow up Education provided;Falls prevention discussed Falls prevention discussed;Education provided - - -   Lives alone in 1 story home.  Any stairs in or around the home? Yes  If so, are there any without handrails? No  Home free of loose throw rugs in walkways, pet beds, electrical cords, etc? Yes  Adequate lighting in your home to reduce risk of falls? Yes   ASSISTIVE DEVICES UTILIZED  TO PREVENT FALLS:  Life alert? No  Use of a cane, walker or w/c? Yes  Grab bars in the bathroom? Yes  Shower chair or bench in shower? Yes  Elevated toilet seat or a handicapped toilet? No    Gait slow and steady without use of assistive device  Cognitive Function:   MMSE - Mini Mental State Exam 11/24/2018 10/31/2017 06/12/2016  Not completed: Refused - -  Orientation to time - 5 5  Orientation to Place - 3 5  Registration - 3 3  Attention/ Calculation - 4 5  Recall - 0 3  Language- name 2 objects - 2 2  Language- repeat - 1 1  Language- follow 3 step command - 3 3  Language- read & follow direction - 1 1  Write a sentence - 1 1  Copy design - 1 1  Total score - 24 30     6CIT Screen 07/27/2020  What Year? 4 points  What month? 3 points  What time? 3 points  Count back from 20 0 points  Months in reverse 4 points  Repeat phrase 10 points  Total Score 24    Immunizations Immunization History  Administered Date(s) Administered  . Influenza Split 11/27/2011, 10/21/2012  . Influenza Whole 10/09/2005, 11/10/2007, 09/20/2008, 10/10/2009, 08/21/2010  . Influenza, High Dose Seasonal PF 10/08/2013, 10/03/2015, 08/27/2016, 10/31/2017, 11/24/2018  . Influenza,inj,Quad PF,6+ Mos 10/12/2014  . Pneumococcal Conjugate-13 11/16/2014  . Pneumococcal Polysaccharide-23 05/21/2006  . Td 06/04/2005, 06/27/2010  . Tdap 05/26/2017    TDAP status: Up to date Flu Vaccine status: Up to date Pneumococcal vaccine status: Up to date Covid-19 vaccine status: Completed vaccines  Screening Tests Health Maintenance  Topic Date Due  . COVID-19 Vaccine (1) Never done  . OPHTHALMOLOGY EXAM  11/14/2016  . INFLUENZA VACCINE  07/24/2020  . FOOT EXAM  06/06/2021  . TETANUS/TDAP  05/27/2027  . PNA vac Low  Risk Adult  Completed  . HEMOGLOBIN A1C  Discontinued  . COLONOSCOPY  Discontinued    Health Maintenance  Health Maintenance Due  Topic Date Due  . COVID-19 Vaccine (1) Never done  .  OPHTHALMOLOGY EXAM  11/14/2016  . INFLUENZA VACCINE  07/24/2020    Colorectal cancer screening: No longer required.   Lung Cancer Screening: (Low Dose CT Chest recommended if Age 31-80 years, 30 pack-year currently smoking OR have quit w/in 15years.) does not qualify.     Additional Screening:  Vision Screening: Recommended annual ophthalmology exams for early detection of glaucoma and other disorders of the eye. Is the patient up to date with their annual eye exam?  Yes    Dental Screening: Recommended annual dental exams for proper oral hygiene  Community Resource Referral / Chronic Care Management: CRR required this visit?  No   CCM required this visit?  No      Plan:    Please schedule your next medicare wellness visit with me in 1 yr.  Continue to eat heart healthy diet (full of fruits, vegetables, whole grains, lean protein, water--limit salt, fat, and sugar intake) and increase physical activity as tolerated.  Continue doing brain stimulating activities (puzzles, reading, adult coloring books, staying active) to keep memory sharp.    I have personally reviewed and noted the following in the patient's chart:   . Medical and social history . Use of alcohol, tobacco or illicit drugs  . Current medications and supplements . Functional ability and status . Nutritional status . Physical activity . Advanced directives . List of other physicians . Hospitalizations, surgeries, and ER visits in previous 12 months . Vitals . Screenings to include cognitive, depression, and falls . Referrals and appointments  In addition, I have reviewed and discussed with patient certain preventive protocols, quality metrics, and best practice recommendations. A written personalized care plan for preventive services as well as general preventive health recommendations were provided to patient.     Shela Nevin, [place]   07/27/2020

## 2020-07-27 ENCOUNTER — Encounter: Payer: Self-pay | Admitting: *Deleted

## 2020-07-27 ENCOUNTER — Ambulatory Visit (INDEPENDENT_AMBULATORY_CARE_PROVIDER_SITE_OTHER): Payer: Medicare Other | Admitting: *Deleted

## 2020-07-27 ENCOUNTER — Other Ambulatory Visit: Payer: Self-pay

## 2020-07-27 ENCOUNTER — Encounter: Payer: Self-pay | Admitting: Family Medicine

## 2020-07-27 ENCOUNTER — Ambulatory Visit (INDEPENDENT_AMBULATORY_CARE_PROVIDER_SITE_OTHER): Payer: Medicare Other | Admitting: Family Medicine

## 2020-07-27 VITALS — BP 138/60 | HR 74 | Temp 98.3°F | Ht 69.5 in | Wt 206.0 lb

## 2020-07-27 DIAGNOSIS — E1169 Type 2 diabetes mellitus with other specified complication: Secondary | ICD-10-CM | POA: Diagnosis not present

## 2020-07-27 DIAGNOSIS — B353 Tinea pedis: Secondary | ICD-10-CM | POA: Diagnosis not present

## 2020-07-27 DIAGNOSIS — R338 Other retention of urine: Secondary | ICD-10-CM | POA: Diagnosis not present

## 2020-07-27 DIAGNOSIS — E669 Obesity, unspecified: Secondary | ICD-10-CM | POA: Diagnosis not present

## 2020-07-27 DIAGNOSIS — F0390 Unspecified dementia without behavioral disturbance: Secondary | ICD-10-CM | POA: Diagnosis not present

## 2020-07-27 DIAGNOSIS — Z0000 Encounter for general adult medical examination without abnormal findings: Secondary | ICD-10-CM | POA: Diagnosis not present

## 2020-07-27 DIAGNOSIS — R443 Hallucinations, unspecified: Secondary | ICD-10-CM

## 2020-07-27 DIAGNOSIS — N401 Enlarged prostate with lower urinary tract symptoms: Secondary | ICD-10-CM | POA: Diagnosis not present

## 2020-07-27 LAB — MICROALBUMIN / CREATININE URINE RATIO
Creatinine,U: 118 mg/dL
Microalb Creat Ratio: 13.1 mg/g (ref 0.0–30.0)
Microalb, Ur: 15.5 mg/dL — ABNORMAL HIGH (ref 0.0–1.9)

## 2020-07-27 LAB — HEMOGLOBIN A1C: Hgb A1c MFr Bld: 6.5 % (ref 4.6–6.5)

## 2020-07-27 MED ORDER — TERBINAFINE HCL 250 MG PO TABS: 250.0000 mg | ORAL_TABLET | Freq: Every day | ORAL | 0 refills | Status: AC

## 2020-07-27 MED ORDER — QUETIAPINE FUMARATE ER 200 MG PO TB24: 200.0000 mg | ORAL_TABLET | Freq: Every day | ORAL | 3 refills | Status: DC

## 2020-07-27 MED FILL — QUEtiapine FUMARATE ER 200: 200 | 30 days supply | Qty: 30 | Fill #0

## 2020-07-27 MED FILL — TERBINAFINE HCL 250 MG TAB: 250 | 14 days supply | Qty: 14 | Fill #0

## 2020-07-27 NOTE — Patient Instructions (Signed)
Please schedule your next medicare wellness visit with me in 1 yr.  Continue to eat heart healthy diet (full of fruits, vegetables, whole grains, lean protein, water--limit salt, fat, and sugar intake) and increase physical activity as tolerated.  Continue doing brain stimulating activities (puzzles, reading, adult coloring books, staying active) to keep memory sharp.    Douglas Grant , Thank you for taking time to come for your Medicare Wellness Visit. I appreciate your ongoing commitment to your health goals. Please review the following plan we discussed and let me know if I can assist you in the future.   These are the goals we discussed: Goals    . Maintain safety at home w/ help of family       This is a list of the screening recommended for you and due dates:  Health Maintenance  Topic Date Due  . Eye exam for diabetics  11/14/2016  . Flu Shot  07/24/2020  . Complete foot exam   06/06/2021  . Tetanus Vaccine  05/27/2027  . COVID-19 Vaccine  Completed  . Pneumonia vaccines  Completed  . Hemoglobin A1C  Discontinued  . Colon Cancer Screening  Discontinued    Preventive Care 93 Years and Older, Male Preventive care refers to lifestyle choices and visits with your health care provider that can promote health and wellness. This includes:  A yearly physical exam. This is also called an annual well check.  Regular dental and eye exams.  Immunizations.  Screening for certain conditions.  Healthy lifestyle choices, such as diet and exercise. What can I expect for my preventive care visit? Physical exam Your health care provider will check:  Height and weight. These may be used to calculate body mass index (BMI), which is a measurement that tells if you are at a healthy weight.  Heart rate and blood pressure.  Your skin for abnormal spots. Counseling Your health care provider may ask you questions about:  Alcohol, tobacco, and drug use.  Emotional well-being.  Home and  relationship well-being.  Sexual activity.  Eating habits.  History of falls.  Memory and ability to understand (cognition).  Work and work Statistician. What immunizations do I need?  Influenza (flu) vaccine  This is recommended every year. Tetanus, diphtheria, and pertussis (Tdap) vaccine  You may need a Td booster every 10 years. Varicella (chickenpox) vaccine  You may need this vaccine if you have not already been vaccinated. Zoster (shingles) vaccine  You may need this after age 64. Pneumococcal conjugate (PCV13) vaccine  One dose is recommended after age 80. Pneumococcal polysaccharide (PPSV23) vaccine  One dose is recommended after age 48. Measles, mumps, and rubella (MMR) vaccine  You may need at least one dose of MMR if you were born in 1957 or later. You may also need a second dose. Meningococcal conjugate (MenACWY) vaccine  You may need this if you have certain conditions. Hepatitis A vaccine  You may need this if you have certain conditions or if you travel or work in places where you may be exposed to hepatitis A. Hepatitis B vaccine  You may need this if you have certain conditions or if you travel or work in places where you may be exposed to hepatitis B. Haemophilus influenzae type b (Hib) vaccine  You may need this if you have certain conditions. You may receive vaccines as individual doses or as more than one vaccine together in one shot (combination vaccines). Talk with your health care provider about the risks  and benefits of combination vaccines. What tests do I need? Blood tests  Lipid and cholesterol levels. These may be checked every 5 years, or more frequently depending on your overall health.  Hepatitis C test.  Hepatitis B test. Screening  Lung cancer screening. You may have this screening every year starting at age 28 if you have a 30-pack-year history of smoking and currently smoke or have quit within the past 15  years.  Colorectal cancer screening. All adults should have this screening starting at age 20 and continuing until age 66. Your health care provider may recommend screening at age 61 if you are at increased risk. You will have tests every 1-10 years, depending on your results and the type of screening test.  Prostate cancer screening. Recommendations will vary depending on your family history and other risks.  Diabetes screening. This is done by checking your blood sugar (glucose) after you have not eaten for a while (fasting). You may have this done every 1-3 years.  Abdominal aortic aneurysm (AAA) screening. You may need this if you are a current or former smoker.  Sexually transmitted disease (STD) testing. Follow these instructions at home: Eating and drinking  Eat a diet that includes fresh fruits and vegetables, whole grains, lean protein, and low-fat dairy products. Limit your intake of foods with high amounts of sugar, saturated fats, and salt.  Take vitamin and mineral supplements as recommended by your health care provider.  Do not drink alcohol if your health care provider tells you not to drink.  If you drink alcohol: ? Limit how much you have to 0-2 drinks a day. ? Be aware of how much alcohol is in your drink. In the [place] one drink equals one 12 oz bottle of beer (355 mL), one 5 oz glass of wine (148 mL), or one 1 oz glass of hard liquor (44 mL). Lifestyle  Take daily care of your teeth and gums.  Stay active. Exercise for at least 30 minutes on 5 or more days each week.  Do not use any products that contain nicotine or tobacco, such as cigarettes, e-cigarettes, and chewing tobacco. If you need help quitting, ask your health care provider.  If you are sexually active, practice safe sex. Use a condom or other form of protection to prevent STIs (sexually transmitted infections).  Talk with your health care provider about taking a low-dose aspirin or statin. What's  next?  Visit your health care provider once a year for a well check visit.  Ask your health care provider how often you should have your eyes and teeth checked.  Stay up to date on all vaccines. This information is not intended to replace advice given to you by your health care provider. Make sure you discuss any questions you have with your health care provider. Document Revised: 12/04/2018 Document Reviewed: 12/04/2018 Elsevier Patient Education  2020 [employer].

## 2020-07-27 NOTE — Patient Instructions (Addendum)
If we are having issues starting our urine stream, let me know. We can get you set up with the urology team. Continue with the finasteride and Uroxatral.   No need to use the cream for the feet any further.   Let me know if there are cost issues.   Let us know if you need anything.

## 2020-07-27 NOTE — Progress Notes (Signed)
Chief Complaint  Patient presents with  . Follow-up    Subjective: Patient is a 86 y.o. male here for f/u.  He is here with his son-in-law who helps provide the history.  Questionable retention for urination. He is compliant w Uroxatral and finasteride daily. No AE's.  At the beginning of the visit, he said it will sometimes be difficult for him to initiate urination.  When asked him how long this takes, he says he will urinate right away.  Still having some athlete's foot despite using cream. He is having difficulty reaching certain spots on his feet. Was trying to be compliant.   Still having hallucinations.  We had increased his Seroquel to 150 mg nightly from 100 mg nightly.  He is tolerating medicine well and reports compliance.  Despite still having hallucinations of seeing bugs and other flying things, it is improved.  He is interested in increasing the dosage.  He is not hearing things or having anyone tell him to do anything dangerous.  Past Medical History:  Diagnosis Date  . Arthritis   . Benign localized prostatic hyperplasia with lower urinary tract symptoms (LUTS)   . DOE (dyspnea on exertion)   . Full dentures   . Gait disturbance   . GERD (gastroesophageal reflux disease)   . Heart murmur   . History of CVA in adulthood 03/2011   acute infarct right lateral thalamus posteriorly in internal capsule,  per pt no residuals  . Hyperlipidemia   . Hypertension   . Hypothyroidism   . Iron deficiency anemia   . Memory difficulty   . Personal history of colonic polyps   . Type 2 diabetes, diet controlled (HCC)     Objective: BP 138/60 (BP Location: Left Arm, Patient Position: Sitting, Cuff Size: Normal)   Pulse 74   Temp 98.3 F (36.8 C) (Oral)   Ht 5' 9.5" (1.765 m)   Wt 206 lb (93.4 kg)   SpO2 93%   BMI 29.98 kg/m  General: Awake, appears stated age HEENT: MMM, EOMi Heart: RRR Lungs: CTAB, no rales, wheezes or rhonchi. No accessory muscle use Psych: Limited  judgment and insight, normal affect and mood  Assessment and Plan: Tinea pedis of both feet - Plan: terbinafine (LAMISIL) 250 MG tablet  Hallucination - Plan: QUEtiapine (SEROQUEL XR) 200 MG 24 hr tablet  Benign prostatic hyperplasia with urinary retention  Dementia without behavioral disturbance, unspecified dementia type ([place]) - Plan: QUEtiapine (SEROQUEL XR) 200 MG 24 hr tablet  1.  2 weeks of terbinafine as he is having difficulty reaching his toes. 2/3.  Increase dosage of Seroquel to 200 mg nightly. 4.  I suspect he has continued symptoms with BPH with urinary retention.  If this does become an issue moving forward, we can send him to the urology team for further evaluation. Follow-up in 6 weeks to recheck Seroquel dosage increase. The patient and his son-in-law voiced understanding and agreement to the plan.  [place], DO 07/27/20  9:53 AM

## 2020-08-04 DIAGNOSIS — H52223 Regular astigmatism, bilateral: Secondary | ICD-10-CM | POA: Diagnosis not present

## 2020-08-04 DIAGNOSIS — E119 Type 2 diabetes mellitus without complications: Secondary | ICD-10-CM | POA: Diagnosis not present

## 2020-08-04 DIAGNOSIS — H524 Presbyopia: Secondary | ICD-10-CM | POA: Diagnosis not present

## 2020-08-04 DIAGNOSIS — H43812 Vitreous degeneration, left eye: Secondary | ICD-10-CM | POA: Diagnosis not present

## 2020-08-04 DIAGNOSIS — H26493 Other secondary cataract, bilateral: Secondary | ICD-10-CM | POA: Diagnosis not present

## 2020-08-04 DIAGNOSIS — H353131 Nonexudative age-related macular degeneration, bilateral, early dry stage: Secondary | ICD-10-CM | POA: Diagnosis not present

## 2020-08-08 ENCOUNTER — Other Ambulatory Visit: Payer: Self-pay | Admitting: Family Medicine

## 2020-08-08 DIAGNOSIS — F0390 Unspecified dementia without behavioral disturbance: Secondary | ICD-10-CM

## 2020-08-22 MED FILL — QUEtiapine FUMARATE ER 200: 200 | 30 days supply | Qty: 30 | Fill #1

## 2020-09-09 ENCOUNTER — Ambulatory Visit (INDEPENDENT_AMBULATORY_CARE_PROVIDER_SITE_OTHER): Payer: Medicare Other | Admitting: Family Medicine

## 2020-09-09 ENCOUNTER — Encounter: Payer: Self-pay | Admitting: Family Medicine

## 2020-09-09 ENCOUNTER — Other Ambulatory Visit: Payer: Self-pay

## 2020-09-09 VITALS — BP 172/70 | HR 87 | Temp 98.3°F | Ht 69.5 in | Wt 208.0 lb

## 2020-09-09 DIAGNOSIS — R5381 Other malaise: Secondary | ICD-10-CM | POA: Insufficient documentation

## 2020-09-09 DIAGNOSIS — F0390 Unspecified dementia without behavioral disturbance: Secondary | ICD-10-CM | POA: Diagnosis not present

## 2020-09-09 DIAGNOSIS — R441 Visual hallucinations: Secondary | ICD-10-CM | POA: Diagnosis not present

## 2020-09-09 DIAGNOSIS — R159 Full incontinence of feces: Secondary | ICD-10-CM | POA: Diagnosis not present

## 2020-09-09 DIAGNOSIS — R0609 Other forms of dyspnea: Secondary | ICD-10-CM

## 2020-09-09 DIAGNOSIS — R0600 Dyspnea, unspecified: Secondary | ICD-10-CM

## 2020-09-09 HISTORY — DX: Other malaise: R53.81

## 2020-09-09 MED ORDER — ALBUTEROL SULFATE HFA 108 (90 BASE) MCG/ACT IN AERS: 2.0000 | INHALATION_SPRAY | Freq: Four times a day (QID) | RESPIRATORY_TRACT | 0 refills | Status: DC | PRN

## 2020-09-09 MED ORDER — QUETIAPINE FUMARATE ER 200 MG PO TB24: 200.0000 mg | ORAL_TABLET | Freq: Every day | ORAL | 2 refills | Status: DC

## 2020-09-09 MED FILL — ALBUTEROL SULFATE HFA 108 (: 108 (90 BAS | 25 days supply | Qty: 18 | Fill #0

## 2020-09-09 NOTE — Patient Instructions (Signed)
Someone will reach out regarding the physical therapy.  Get in touch with a home regarding the FL-2 form for me to fill out.   Let us know if you need anything.

## 2020-09-09 NOTE — Addendum Note (Signed)
Addended by: Sharon Seller B on: 09/09/2020 10:09 AM   Modules accepted: Orders

## 2020-09-09 NOTE — Progress Notes (Signed)
Chief Complaint  Patient presents with  . Follow-up    breathing problems    Subjective: Patient is a 86 y.o. male here for f/u.  He is here with his son-in-law.  Douglas Grant was seeing flying things which he reports is better after increasing the dosage of his Seroquel to 200 mg nightly.  No side effects reported.  He is compliant with medication.  He is not having any auditory hallucinations.  He does have a history of dementia but no other behavioral changes.  Douglas Grant has had issues with fecal continence as he will be feces smeared on his clothing and defecate around the toilet and in the trash can.  Is becoming more difficult to care for him per his son-in-law.  Past Medical History:  Diagnosis Date  . Arthritis   . Benign localized prostatic hyperplasia with lower urinary tract symptoms (LUTS)   . DOE (dyspnea on exertion)   . Full dentures   . Gait disturbance   . GERD (gastroesophageal reflux disease)   . Heart murmur   . History of CVA in adulthood 03/2011   acute infarct right lateral thalamus posteriorly in internal capsule,  per pt no residuals  . Hyperlipidemia   . Hypertension   . Hypothyroidism   . Iron deficiency anemia   . Memory difficulty   . Personal history of colonic polyps   . Type 2 diabetes, diet controlled (HCC)     Objective: BP (!) 172/70 (BP Location: Left Arm, Patient Position: Sitting, Cuff Size: Normal)   Pulse 87   Temp 98.3 F (36.8 C) (Oral)   Ht 5' 9.5" (1.765 m)   Wt 208 lb (94.3 kg)   SpO2 93%   BMI 30.28 kg/m  General: Awake, appears stated age HEENT: MMM, EOMi Heart: RRR, no murmurs Lungs: CTAB, no rales, wheezes or rhonchi. No accessory muscle use Neuro: Alert to person only. Psych:limited judgment and insight, normal affect and mood  Assessment and Plan: Physical deconditioning  Incontinence of feces, unspecified fecal incontinence type  Dementia without behavioral disturbance, unspecified dementia type ([place])  DOE (dyspnea on  exertion) - Plan: albuterol (VENTOLIN HFA) 108 (90 Base) MCG/ACT inhaler  Visual hallucinations - Plan: QUEtiapine (SEROQUEL XR) 200 MG 24 hr tablet  1. Will refer to home PT again. 2/3. Encouraged family to look into placing pt into home where he can receive more care. 4. Trial SABA given smoking hx. Could be related to #1. 5. Cont Seroquel.  Mind BP, f/u in 3 mo.  The patient and his SIL voiced understanding and agreement to the plan.  [place], DO 09/09/20  9:19 AM

## 2020-09-12 ENCOUNTER — Telehealth: Payer: Self-pay | Admitting: Family Medicine

## 2020-09-12 NOTE — Telephone Encounter (Signed)
Caller [name]  Call Back # [phone]  Patient's daughter would like call back in reference to patient last appointment with Dr Nani Ravens. Patient daughter states something about a FL2 Form was mention and she would like clarification.

## 2020-09-16 NOTE — Telephone Encounter (Signed)
Left message on machine to call back  

## 2020-09-19 NOTE — Telephone Encounter (Signed)
Called informed the patients daughter she would need to locate a nursing home/and get FL2 from that location. She is aware of this as did before with her mom. The daughter did want to know if her dad could start out in Assisted living? The daughter would really like to speak to PCP regarding her dad. Her desk phone is [phone]) and may be a better number to call her.

## 2020-09-19 NOTE — Telephone Encounter (Signed)
Called left message to call back 

## 2020-09-20 NOTE — Telephone Encounter (Signed)
Spoke w pt's daughter [name] and discussed her desire to start with lower level assisted living rather than nursing home level of care. I am OK with this as he does not require a memory unit. She will fill out FL-2 as much as she can and drop off the rest. Probably won't need appt.

## 2020-09-28 DIAGNOSIS — E1159 Type 2 diabetes mellitus with other circulatory complications: Secondary | ICD-10-CM | POA: Diagnosis not present

## 2020-09-28 DIAGNOSIS — B351 Tinea unguium: Secondary | ICD-10-CM | POA: Diagnosis not present

## 2020-09-28 DIAGNOSIS — M79671 Pain in right foot: Secondary | ICD-10-CM | POA: Diagnosis not present

## 2020-09-28 DIAGNOSIS — M79672 Pain in left foot: Secondary | ICD-10-CM | POA: Diagnosis not present

## 2020-09-29 ENCOUNTER — Telehealth: Payer: Self-pay | Admitting: Family Medicine

## 2020-09-29 NOTE — Telephone Encounter (Signed)
Pt's family member dropped off document to be filled out by provider [facility] - in a small white envelope) Family member [name] would like to be called at [phone] when document ready. Document put at front office tray under providers name.

## 2020-10-03 DIAGNOSIS — E785 Hyperlipidemia, unspecified: Secondary | ICD-10-CM | POA: Diagnosis not present

## 2020-10-03 DIAGNOSIS — F0390 Unspecified dementia without behavioral disturbance: Secondary | ICD-10-CM | POA: Diagnosis not present

## 2020-10-03 DIAGNOSIS — F0280 Dementia in other diseases classified elsewhere without behavioral disturbance: Secondary | ICD-10-CM | POA: Diagnosis not present

## 2020-10-03 DIAGNOSIS — D509 Iron deficiency anemia, unspecified: Secondary | ICD-10-CM | POA: Diagnosis not present

## 2020-10-03 DIAGNOSIS — Z9181 History of falling: Secondary | ICD-10-CM | POA: Diagnosis not present

## 2020-10-03 DIAGNOSIS — Z7951 Long term (current) use of inhaled steroids: Secondary | ICD-10-CM | POA: Diagnosis not present

## 2020-10-03 DIAGNOSIS — Z85828 Personal history of other malignant neoplasm of skin: Secondary | ICD-10-CM | POA: Diagnosis not present

## 2020-10-03 DIAGNOSIS — E119 Type 2 diabetes mellitus without complications: Secondary | ICD-10-CM | POA: Diagnosis not present

## 2020-10-03 DIAGNOSIS — Z8673 Personal history of transient ischemic attack (TIA), and cerebral infarction without residual deficits: Secondary | ICD-10-CM | POA: Diagnosis not present

## 2020-10-03 DIAGNOSIS — K219 Gastro-esophageal reflux disease without esophagitis: Secondary | ICD-10-CM | POA: Diagnosis not present

## 2020-10-03 DIAGNOSIS — M1990 Unspecified osteoarthritis, unspecified site: Secondary | ICD-10-CM | POA: Diagnosis not present

## 2020-10-03 DIAGNOSIS — N400 Enlarged prostate without lower urinary tract symptoms: Secondary | ICD-10-CM | POA: Diagnosis not present

## 2020-10-03 DIAGNOSIS — E039 Hypothyroidism, unspecified: Secondary | ICD-10-CM | POA: Diagnosis not present

## 2020-10-03 DIAGNOSIS — I10 Essential (primary) hypertension: Secondary | ICD-10-CM | POA: Diagnosis not present

## 2020-10-03 NOTE — Telephone Encounter (Signed)
Called all numbers in chart left message to call back Paper work completed and is at the front.

## 2020-10-03 NOTE — Telephone Encounter (Signed)
Called informed that paperwork is done and ready for pickup

## 2020-10-19 DIAGNOSIS — M1990 Unspecified osteoarthritis, unspecified site: Secondary | ICD-10-CM | POA: Diagnosis not present

## 2020-10-19 DIAGNOSIS — I10 Essential (primary) hypertension: Secondary | ICD-10-CM | POA: Diagnosis not present

## 2020-10-19 DIAGNOSIS — N400 Enlarged prostate without lower urinary tract symptoms: Secondary | ICD-10-CM | POA: Diagnosis not present

## 2020-10-19 DIAGNOSIS — F0280 Dementia in other diseases classified elsewhere without behavioral disturbance: Secondary | ICD-10-CM | POA: Diagnosis not present

## 2020-10-19 DIAGNOSIS — E039 Hypothyroidism, unspecified: Secondary | ICD-10-CM | POA: Diagnosis not present

## 2020-10-19 DIAGNOSIS — E119 Type 2 diabetes mellitus without complications: Secondary | ICD-10-CM | POA: Diagnosis not present

## 2020-10-28 ENCOUNTER — Telehealth: Payer: Self-pay | Admitting: Family Medicine

## 2020-10-28 NOTE — Telephone Encounter (Signed)
Daughter states in order for her father to be admitted into an assistant living they need a TB test & Flu shot. They are hoping to get everything done early this week in order for the patient to admit.

## 2020-10-28 NOTE — Telephone Encounter (Signed)
Called and scheduled OV for Monday to take care of TB /flu shot.

## 2020-10-31 ENCOUNTER — Encounter: Payer: Self-pay | Admitting: Family Medicine

## 2020-10-31 ENCOUNTER — Other Ambulatory Visit: Payer: Self-pay

## 2020-10-31 ENCOUNTER — Ambulatory Visit (INDEPENDENT_AMBULATORY_CARE_PROVIDER_SITE_OTHER): Payer: Medicare Other | Admitting: Family Medicine

## 2020-10-31 ENCOUNTER — Other Ambulatory Visit: Payer: Self-pay | Admitting: Family Medicine

## 2020-10-31 VITALS — BP 144/84 | HR 77 | Temp 98.0°F | Ht 69.5 in | Wt 207.2 lb

## 2020-10-31 DIAGNOSIS — Z111 Encounter for screening for respiratory tuberculosis: Secondary | ICD-10-CM | POA: Diagnosis not present

## 2020-10-31 DIAGNOSIS — Z23 Encounter for immunization: Secondary | ICD-10-CM

## 2020-10-31 DIAGNOSIS — M542 Cervicalgia: Secondary | ICD-10-CM

## 2020-10-31 DIAGNOSIS — R0609 Other forms of dyspnea: Secondary | ICD-10-CM

## 2020-10-31 DIAGNOSIS — R0600 Dyspnea, unspecified: Secondary | ICD-10-CM | POA: Diagnosis not present

## 2020-10-31 DIAGNOSIS — R441 Visual hallucinations: Secondary | ICD-10-CM | POA: Diagnosis not present

## 2020-10-31 MED ORDER — UMECLIDINIUM BROMIDE 62.5 MCG/INH IN AEPB: 1.0000 | INHALATION_SPRAY | Freq: Every day | RESPIRATORY_TRACT | 2 refills | Status: DC

## 2020-10-31 MED FILL — INCRUSE ELLIPTA 62.5 MCG IN: 62.5 | 30 days supply | Qty: 30 | Fill #0

## 2020-10-31 NOTE — Patient Instructions (Addendum)
Keep the diet clean and stay active.  Let me know if there are cost issues.   Use the new inhaler daily.  Continue all other medications.   Let us know if you need anything.

## 2020-10-31 NOTE — Progress Notes (Signed)
Chief Complaint  Patient presents with  . Follow-up    Subjective: Patient is a 86 y.o. male here for follow-up.  He is here with his son-in-law.  The patient was seen around 6 weeks ago for several issues.  He was complaining of dyspnea on exertion he does have a smoking history.  He was started on albuterol as needed.  To his son-in-law's knowledge, he has used it 3 times and has worked Chief Executive Officer time.  No side effects.  He still feels like he has difficulty getting his full breath.  The patient's blood pressure has remained stable and he continues to be compliant with his medications.  The patient is currently on Seroquel 200 mg XR and reports no hallucinations since being on this medication for more than several days.  He is tolerating the medication well and is not reporting any adverse effects.  Lastly, he woke up with some midline neck pain.  No injury or change in activity.  No weakness or neurologic signs or symptoms.  Has not tried anything so far.  He denies any redness, swelling, bruising, or decreased range of motion.  Past Medical History:  Diagnosis Date  . Arthritis   . Benign localized prostatic hyperplasia with lower urinary tract symptoms (LUTS)   . DOE (dyspnea on exertion)   . Full dentures   . Gait disturbance   . GERD (gastroesophageal reflux disease)   . Heart murmur   . History of CVA in adulthood 03/2011   acute infarct right lateral thalamus posteriorly in internal capsule,  per pt no residuals  . Hyperlipidemia   . Hypertension   . Hypothyroidism   . Iron deficiency anemia   . Memory difficulty   . Personal history of colonic polyps   . Type 2 diabetes, diet controlled (HCC)     Objective: BP (!) 144/84 (BP Location: Left Arm, Patient Position: Sitting, Cuff Size: Normal)   Pulse 77   Temp 98 F (36.7 C) (Oral)   Ht 5' 9.5" (1.765 m)   Wt 207 lb 4 oz (94 kg)   SpO2 95%   BMI 30.17 kg/m  General: Awake, appears stated age HEENT: MMM, EOMi Heart:  RRR, 2+ pitting edema tapering at the mid tibia bilaterally Lungs: CTAB, no rales, wheezes or rhonchi. No accessory muscle use MSK: No tenderness to palpation of the midline or paraspinal musculature.  Normal range of motion.  No deformity. Psych: Limited judgment and insight, normal affect and mood  Assessment and Plan: DOE (dyspnea on exertion) - Plan: umeclidinium bromide (INCRUSE ELLIPTA) 62.5 MCG/INH AEPB  Neck pain  Visual hallucinations  Need for influenza vaccination - Plan: Flu Vaccine QUAD High Dose(Fluad)  Screening for tuberculosis - Plan: PPD  1.  Continue albuterol as needed.  Start Incruse daily.  He likely has undiagnosed COPD with a smoking history and age.  I will treat as such, but if he does not respond we will need to obtain some imaging and pulmonary function testing. 2.  Gentle stretches and exercises, heat, monitor.  If no improvement, will consider physical therapy. 3.  Continue Seroquel at current dosage.  He is tolerating well and it is working well. I will see him as originally scheduled in the middle of December to follow-up on his breathing. The patient and his son-in-law voiced understanding and agreement to the plan.  [place], DO 10/31/20  4:46 PM

## 2020-10-31 NOTE — Progress Notes (Signed)
t

## 2020-11-02 ENCOUNTER — Encounter: Payer: Self-pay | Admitting: Family Medicine

## 2020-11-02 DIAGNOSIS — F0390 Unspecified dementia without behavioral disturbance: Secondary | ICD-10-CM | POA: Diagnosis not present

## 2020-11-02 LAB — TB SKIN TEST
Induration: 0 mm
TB Skin Test: NEGATIVE

## 2020-11-03 ENCOUNTER — Telehealth: Payer: Self-pay | Admitting: Family Medicine

## 2020-11-03 NOTE — Telephone Encounter (Signed)
Found up to date Fl2 form on Robin's desk , re-faxed .

## 2020-11-03 NOTE — Telephone Encounter (Signed)
Page 2 stamped and faxed

## 2020-11-03 NOTE — Telephone Encounter (Signed)
Caller name: Douglas Grant [facility]) Call back number: [phone]          [phone] (fax)  Douglas Grant states they need a copy of FL2 paperwork fax to them asap.

## 2020-11-03 NOTE — Telephone Encounter (Signed)
Brittney call back stating they received the FL2 however, is the wrong one. They will be faxing a new one. They need the form today.

## 2020-11-03 NOTE — Telephone Encounter (Signed)
Form faxed

## 2020-11-03 NOTE — Telephone Encounter (Signed)
[place] From [facility] back in reference to FL-2 forms , per [place] they did not received all FL 2 forms.   Please fax froms to [phone]

## 2020-11-03 NOTE — Telephone Encounter (Signed)
Called Douglas Grant and left voicemail to let her know we need both pages of FL-2 form

## 2020-11-03 NOTE — Telephone Encounter (Signed)
New Fl2 forms placed in [employer]

## 2020-11-04 ENCOUNTER — Telehealth: Payer: Self-pay | Admitting: Family Medicine

## 2020-11-04 NOTE — Telephone Encounter (Signed)
Form placed in Wendling's signature folder

## 2020-11-04 NOTE — Telephone Encounter (Signed)
Document received through fax machine for provider to fill out ( 7 pages from [facility]) Document put at front office tray under providers name.

## 2020-11-08 ENCOUNTER — Other Ambulatory Visit: Payer: Self-pay | Admitting: Family Medicine

## 2020-11-08 DIAGNOSIS — G252 Other specified forms of tremor: Secondary | ICD-10-CM

## 2020-11-15 DIAGNOSIS — E119 Type 2 diabetes mellitus without complications: Secondary | ICD-10-CM | POA: Diagnosis not present

## 2020-11-15 DIAGNOSIS — E785 Hyperlipidemia, unspecified: Secondary | ICD-10-CM | POA: Diagnosis not present

## 2020-11-15 DIAGNOSIS — M1990 Unspecified osteoarthritis, unspecified site: Secondary | ICD-10-CM | POA: Diagnosis not present

## 2020-11-15 DIAGNOSIS — Z79899 Other long term (current) drug therapy: Secondary | ICD-10-CM | POA: Diagnosis not present

## 2020-11-15 DIAGNOSIS — I10 Essential (primary) hypertension: Secondary | ICD-10-CM | POA: Diagnosis not present

## 2020-11-15 DIAGNOSIS — Z86010 Personal history of colonic polyps: Secondary | ICD-10-CM | POA: Diagnosis not present

## 2020-11-15 DIAGNOSIS — Z87891 Personal history of nicotine dependence: Secondary | ICD-10-CM | POA: Diagnosis not present

## 2020-11-15 DIAGNOSIS — K219 Gastro-esophageal reflux disease without esophagitis: Secondary | ICD-10-CM | POA: Diagnosis not present

## 2020-11-15 DIAGNOSIS — Z9181 History of falling: Secondary | ICD-10-CM | POA: Diagnosis not present

## 2020-11-15 DIAGNOSIS — N400 Enlarged prostate without lower urinary tract symptoms: Secondary | ICD-10-CM | POA: Diagnosis not present

## 2020-11-15 DIAGNOSIS — M542 Cervicalgia: Secondary | ICD-10-CM | POA: Diagnosis not present

## 2020-11-15 DIAGNOSIS — D509 Iron deficiency anemia, unspecified: Secondary | ICD-10-CM | POA: Diagnosis not present

## 2020-11-15 DIAGNOSIS — F0390 Unspecified dementia without behavioral disturbance: Secondary | ICD-10-CM | POA: Diagnosis not present

## 2020-11-15 DIAGNOSIS — E039 Hypothyroidism, unspecified: Secondary | ICD-10-CM | POA: Diagnosis not present

## 2020-11-22 ENCOUNTER — Telehealth: Payer: Self-pay | Admitting: Family Medicine

## 2020-11-22 DIAGNOSIS — M1990 Unspecified osteoarthritis, unspecified site: Secondary | ICD-10-CM | POA: Diagnosis not present

## 2020-11-22 DIAGNOSIS — M542 Cervicalgia: Secondary | ICD-10-CM | POA: Diagnosis not present

## 2020-11-22 DIAGNOSIS — E119 Type 2 diabetes mellitus without complications: Secondary | ICD-10-CM | POA: Diagnosis not present

## 2020-11-22 DIAGNOSIS — D509 Iron deficiency anemia, unspecified: Secondary | ICD-10-CM | POA: Diagnosis not present

## 2020-11-22 DIAGNOSIS — F0390 Unspecified dementia without behavioral disturbance: Secondary | ICD-10-CM | POA: Diagnosis not present

## 2020-11-22 DIAGNOSIS — I10 Essential (primary) hypertension: Secondary | ICD-10-CM | POA: Diagnosis not present

## 2020-11-22 NOTE — Telephone Encounter (Signed)
Therapy like counseling? I don't think so. For gait instability and weakness, I think that'd be a good idea. Ty.

## 2020-11-22 NOTE — Telephone Encounter (Signed)
[facility] with [facility]  Call Back # [phone] Fax Number # [phone]  Per Earnest Bailey , she needs notes from last face to face office office on 10/31/2020 faxed to her at [phone], She would also like to know if patient should has therapy care ?   Please advise

## 2020-11-22 NOTE — Telephone Encounter (Signed)
HHRN informed of PCP response.

## 2020-11-22 NOTE — Telephone Encounter (Signed)
Have faxed office notes Please advise on question

## 2020-11-23 ENCOUNTER — Telehealth: Payer: Self-pay | Admitting: Family Medicine

## 2020-11-23 DIAGNOSIS — D509 Iron deficiency anemia, unspecified: Secondary | ICD-10-CM | POA: Diagnosis not present

## 2020-11-23 DIAGNOSIS — M1990 Unspecified osteoarthritis, unspecified site: Secondary | ICD-10-CM | POA: Diagnosis not present

## 2020-11-23 DIAGNOSIS — M542 Cervicalgia: Secondary | ICD-10-CM | POA: Diagnosis not present

## 2020-11-23 DIAGNOSIS — F0390 Unspecified dementia without behavioral disturbance: Secondary | ICD-10-CM | POA: Diagnosis not present

## 2020-11-23 DIAGNOSIS — E119 Type 2 diabetes mellitus without complications: Secondary | ICD-10-CM | POA: Diagnosis not present

## 2020-11-23 DIAGNOSIS — I10 Essential (primary) hypertension: Secondary | ICD-10-CM | POA: Diagnosis not present

## 2020-11-23 NOTE — Telephone Encounter (Signed)
[facility] called to report patients oxygen has been dropping in the 70's and 80's during therapy. Courtney RN states he is prescribed Albuterol prn, but she stated the patient does not know when to ask for it. Also, states they have noticed when he walks to the dining room he is struggling with his breathing. They are in need of something else/or instructions to be more specific on the Albuterol. New orders can be faxed ATTN: Courtney at [phone] Telephone number to [facility] is [phone].

## 2020-11-24 DIAGNOSIS — F0390 Unspecified dementia without behavioral disturbance: Secondary | ICD-10-CM | POA: Diagnosis not present

## 2020-11-24 DIAGNOSIS — M1990 Unspecified osteoarthritis, unspecified site: Secondary | ICD-10-CM | POA: Diagnosis not present

## 2020-11-24 DIAGNOSIS — D509 Iron deficiency anemia, unspecified: Secondary | ICD-10-CM | POA: Diagnosis not present

## 2020-11-24 DIAGNOSIS — I10 Essential (primary) hypertension: Secondary | ICD-10-CM | POA: Diagnosis not present

## 2020-11-24 DIAGNOSIS — M542 Cervicalgia: Secondary | ICD-10-CM | POA: Diagnosis not present

## 2020-11-24 DIAGNOSIS — E119 Type 2 diabetes mellitus without complications: Secondary | ICD-10-CM | POA: Diagnosis not present

## 2020-11-24 NOTE — Telephone Encounter (Signed)
New sig should include use 10 minutes before physical activity. Ty.

## 2020-11-25 DIAGNOSIS — I10 Essential (primary) hypertension: Secondary | ICD-10-CM | POA: Diagnosis not present

## 2020-11-25 DIAGNOSIS — D509 Iron deficiency anemia, unspecified: Secondary | ICD-10-CM | POA: Diagnosis not present

## 2020-11-25 DIAGNOSIS — E119 Type 2 diabetes mellitus without complications: Secondary | ICD-10-CM | POA: Diagnosis not present

## 2020-11-25 DIAGNOSIS — M1990 Unspecified osteoarthritis, unspecified site: Secondary | ICD-10-CM | POA: Diagnosis not present

## 2020-11-25 DIAGNOSIS — M542 Cervicalgia: Secondary | ICD-10-CM | POA: Diagnosis not present

## 2020-11-25 DIAGNOSIS — F0390 Unspecified dementia without behavioral disturbance: Secondary | ICD-10-CM | POA: Diagnosis not present

## 2020-11-25 NOTE — Telephone Encounter (Signed)
Loma Sousa called back to follow up on this and I read to her the information provided by Dr. Nani Ravens.  She is requesting this be sent over to her on Monday once you return to the office.

## 2020-11-25 NOTE — Telephone Encounter (Signed)
Med list has been updated. Printed and faxed below authorization to ATTN: Loma Sousa @ [phone]

## 2020-11-28 DIAGNOSIS — D509 Iron deficiency anemia, unspecified: Secondary | ICD-10-CM | POA: Diagnosis not present

## 2020-11-28 DIAGNOSIS — M1990 Unspecified osteoarthritis, unspecified site: Secondary | ICD-10-CM | POA: Diagnosis not present

## 2020-11-28 DIAGNOSIS — F0390 Unspecified dementia without behavioral disturbance: Secondary | ICD-10-CM | POA: Diagnosis not present

## 2020-11-28 DIAGNOSIS — M542 Cervicalgia: Secondary | ICD-10-CM | POA: Diagnosis not present

## 2020-11-28 DIAGNOSIS — E119 Type 2 diabetes mellitus without complications: Secondary | ICD-10-CM | POA: Diagnosis not present

## 2020-11-28 DIAGNOSIS — I10 Essential (primary) hypertension: Secondary | ICD-10-CM | POA: Diagnosis not present

## 2020-11-29 DIAGNOSIS — E039 Hypothyroidism, unspecified: Secondary | ICD-10-CM | POA: Diagnosis not present

## 2020-11-29 DIAGNOSIS — I10 Essential (primary) hypertension: Secondary | ICD-10-CM | POA: Diagnosis not present

## 2020-11-29 DIAGNOSIS — E785 Hyperlipidemia, unspecified: Secondary | ICD-10-CM | POA: Diagnosis not present

## 2020-11-30 DIAGNOSIS — R0609 Other forms of dyspnea: Secondary | ICD-10-CM | POA: Diagnosis not present

## 2020-11-30 DIAGNOSIS — E038 Other specified hypothyroidism: Secondary | ICD-10-CM | POA: Diagnosis not present

## 2020-11-30 DIAGNOSIS — E119 Type 2 diabetes mellitus without complications: Secondary | ICD-10-CM | POA: Diagnosis not present

## 2020-11-30 DIAGNOSIS — D509 Iron deficiency anemia, unspecified: Secondary | ICD-10-CM | POA: Diagnosis not present

## 2020-11-30 DIAGNOSIS — M542 Cervicalgia: Secondary | ICD-10-CM | POA: Diagnosis not present

## 2020-11-30 DIAGNOSIS — F3489 Other specified persistent mood disorders: Secondary | ICD-10-CM | POA: Diagnosis not present

## 2020-11-30 DIAGNOSIS — I517 Cardiomegaly: Secondary | ICD-10-CM | POA: Diagnosis not present

## 2020-11-30 DIAGNOSIS — M1990 Unspecified osteoarthritis, unspecified site: Secondary | ICD-10-CM | POA: Diagnosis not present

## 2020-11-30 DIAGNOSIS — Z789 Other specified health status: Secondary | ICD-10-CM | POA: Diagnosis not present

## 2020-11-30 DIAGNOSIS — I5032 Chronic diastolic (congestive) heart failure: Secondary | ICD-10-CM | POA: Diagnosis not present

## 2020-11-30 DIAGNOSIS — E118 Type 2 diabetes mellitus with unspecified complications: Secondary | ICD-10-CM | POA: Diagnosis not present

## 2020-11-30 DIAGNOSIS — F0390 Unspecified dementia without behavioral disturbance: Secondary | ICD-10-CM | POA: Diagnosis not present

## 2020-11-30 DIAGNOSIS — N400 Enlarged prostate without lower urinary tract symptoms: Secondary | ICD-10-CM | POA: Diagnosis not present

## 2020-11-30 DIAGNOSIS — I10 Essential (primary) hypertension: Secondary | ICD-10-CM | POA: Diagnosis not present

## 2020-11-30 DIAGNOSIS — F0280 Dementia in other diseases classified elsewhere without behavioral disturbance: Secondary | ICD-10-CM | POA: Diagnosis not present

## 2020-12-01 DIAGNOSIS — I10 Essential (primary) hypertension: Secondary | ICD-10-CM | POA: Diagnosis not present

## 2020-12-01 DIAGNOSIS — M542 Cervicalgia: Secondary | ICD-10-CM | POA: Diagnosis not present

## 2020-12-01 DIAGNOSIS — D509 Iron deficiency anemia, unspecified: Secondary | ICD-10-CM | POA: Diagnosis not present

## 2020-12-01 DIAGNOSIS — M1990 Unspecified osteoarthritis, unspecified site: Secondary | ICD-10-CM | POA: Diagnosis not present

## 2020-12-01 DIAGNOSIS — F0390 Unspecified dementia without behavioral disturbance: Secondary | ICD-10-CM | POA: Diagnosis not present

## 2020-12-01 DIAGNOSIS — E119 Type 2 diabetes mellitus without complications: Secondary | ICD-10-CM | POA: Diagnosis not present

## 2020-12-07 DIAGNOSIS — D509 Iron deficiency anemia, unspecified: Secondary | ICD-10-CM | POA: Diagnosis not present

## 2020-12-07 DIAGNOSIS — M542 Cervicalgia: Secondary | ICD-10-CM | POA: Diagnosis not present

## 2020-12-07 DIAGNOSIS — F0390 Unspecified dementia without behavioral disturbance: Secondary | ICD-10-CM | POA: Diagnosis not present

## 2020-12-07 DIAGNOSIS — M1990 Unspecified osteoarthritis, unspecified site: Secondary | ICD-10-CM | POA: Diagnosis not present

## 2020-12-07 DIAGNOSIS — E119 Type 2 diabetes mellitus without complications: Secondary | ICD-10-CM | POA: Diagnosis not present

## 2020-12-07 DIAGNOSIS — I10 Essential (primary) hypertension: Secondary | ICD-10-CM | POA: Diagnosis not present

## 2020-12-09 ENCOUNTER — Ambulatory Visit: Payer: Medicare Other | Admitting: Family Medicine

## 2020-12-09 DIAGNOSIS — D509 Iron deficiency anemia, unspecified: Secondary | ICD-10-CM | POA: Diagnosis not present

## 2020-12-09 DIAGNOSIS — F0390 Unspecified dementia without behavioral disturbance: Secondary | ICD-10-CM | POA: Diagnosis not present

## 2020-12-09 DIAGNOSIS — M1990 Unspecified osteoarthritis, unspecified site: Secondary | ICD-10-CM | POA: Diagnosis not present

## 2020-12-09 DIAGNOSIS — I10 Essential (primary) hypertension: Secondary | ICD-10-CM | POA: Diagnosis not present

## 2020-12-09 DIAGNOSIS — E119 Type 2 diabetes mellitus without complications: Secondary | ICD-10-CM | POA: Diagnosis not present

## 2020-12-09 DIAGNOSIS — M542 Cervicalgia: Secondary | ICD-10-CM | POA: Diagnosis not present

## 2020-12-13 DIAGNOSIS — F330 Major depressive disorder, recurrent, mild: Secondary | ICD-10-CM | POA: Diagnosis not present

## 2020-12-13 DIAGNOSIS — G301 Alzheimer's disease with late onset: Secondary | ICD-10-CM | POA: Diagnosis not present

## 2020-12-13 DIAGNOSIS — F0281 Dementia in other diseases classified elsewhere with behavioral disturbance: Secondary | ICD-10-CM | POA: Diagnosis not present

## 2020-12-13 DIAGNOSIS — F5105 Insomnia due to other mental disorder: Secondary | ICD-10-CM | POA: Diagnosis not present

## 2020-12-13 DIAGNOSIS — R441 Visual hallucinations: Secondary | ICD-10-CM | POA: Diagnosis not present

## 2020-12-15 DIAGNOSIS — I10 Essential (primary) hypertension: Secondary | ICD-10-CM | POA: Diagnosis not present

## 2020-12-15 DIAGNOSIS — E785 Hyperlipidemia, unspecified: Secondary | ICD-10-CM | POA: Diagnosis not present

## 2020-12-15 DIAGNOSIS — D509 Iron deficiency anemia, unspecified: Secondary | ICD-10-CM | POA: Diagnosis not present

## 2020-12-15 DIAGNOSIS — Z9181 History of falling: Secondary | ICD-10-CM | POA: Diagnosis not present

## 2020-12-15 DIAGNOSIS — Z87891 Personal history of nicotine dependence: Secondary | ICD-10-CM | POA: Diagnosis not present

## 2020-12-15 DIAGNOSIS — E039 Hypothyroidism, unspecified: Secondary | ICD-10-CM | POA: Diagnosis not present

## 2020-12-15 DIAGNOSIS — E119 Type 2 diabetes mellitus without complications: Secondary | ICD-10-CM | POA: Diagnosis not present

## 2020-12-15 DIAGNOSIS — M1990 Unspecified osteoarthritis, unspecified site: Secondary | ICD-10-CM | POA: Diagnosis not present

## 2020-12-15 DIAGNOSIS — Z86010 Personal history of colonic polyps: Secondary | ICD-10-CM | POA: Diagnosis not present

## 2020-12-15 DIAGNOSIS — M542 Cervicalgia: Secondary | ICD-10-CM | POA: Diagnosis not present

## 2020-12-15 DIAGNOSIS — F0390 Unspecified dementia without behavioral disturbance: Secondary | ICD-10-CM | POA: Diagnosis not present

## 2020-12-15 DIAGNOSIS — Z79899 Other long term (current) drug therapy: Secondary | ICD-10-CM | POA: Diagnosis not present

## 2020-12-15 DIAGNOSIS — K219 Gastro-esophageal reflux disease without esophagitis: Secondary | ICD-10-CM | POA: Diagnosis not present

## 2020-12-15 DIAGNOSIS — N400 Enlarged prostate without lower urinary tract symptoms: Secondary | ICD-10-CM | POA: Diagnosis not present

## 2020-12-16 DIAGNOSIS — I10 Essential (primary) hypertension: Secondary | ICD-10-CM | POA: Diagnosis not present

## 2020-12-16 DIAGNOSIS — F0390 Unspecified dementia without behavioral disturbance: Secondary | ICD-10-CM | POA: Diagnosis not present

## 2020-12-16 DIAGNOSIS — D509 Iron deficiency anemia, unspecified: Secondary | ICD-10-CM | POA: Diagnosis not present

## 2020-12-16 DIAGNOSIS — E119 Type 2 diabetes mellitus without complications: Secondary | ICD-10-CM | POA: Diagnosis not present

## 2020-12-16 DIAGNOSIS — M1990 Unspecified osteoarthritis, unspecified site: Secondary | ICD-10-CM | POA: Diagnosis not present

## 2020-12-16 DIAGNOSIS — M542 Cervicalgia: Secondary | ICD-10-CM | POA: Diagnosis not present

## 2020-12-19 DIAGNOSIS — I10 Essential (primary) hypertension: Secondary | ICD-10-CM | POA: Diagnosis not present

## 2020-12-19 DIAGNOSIS — M1990 Unspecified osteoarthritis, unspecified site: Secondary | ICD-10-CM | POA: Diagnosis not present

## 2020-12-19 DIAGNOSIS — B351 Tinea unguium: Secondary | ICD-10-CM | POA: Diagnosis not present

## 2020-12-19 DIAGNOSIS — F0390 Unspecified dementia without behavioral disturbance: Secondary | ICD-10-CM | POA: Diagnosis not present

## 2020-12-19 DIAGNOSIS — E119 Type 2 diabetes mellitus without complications: Secondary | ICD-10-CM | POA: Diagnosis not present

## 2020-12-19 DIAGNOSIS — M79674 Pain in right toe(s): Secondary | ICD-10-CM | POA: Diagnosis not present

## 2020-12-19 DIAGNOSIS — M542 Cervicalgia: Secondary | ICD-10-CM | POA: Diagnosis not present

## 2020-12-19 DIAGNOSIS — D509 Iron deficiency anemia, unspecified: Secondary | ICD-10-CM | POA: Diagnosis not present

## 2020-12-21 DIAGNOSIS — M1990 Unspecified osteoarthritis, unspecified site: Secondary | ICD-10-CM | POA: Diagnosis not present

## 2020-12-21 DIAGNOSIS — D509 Iron deficiency anemia, unspecified: Secondary | ICD-10-CM | POA: Diagnosis not present

## 2020-12-21 DIAGNOSIS — F0390 Unspecified dementia without behavioral disturbance: Secondary | ICD-10-CM | POA: Diagnosis not present

## 2020-12-21 DIAGNOSIS — M542 Cervicalgia: Secondary | ICD-10-CM | POA: Diagnosis not present

## 2020-12-21 DIAGNOSIS — I10 Essential (primary) hypertension: Secondary | ICD-10-CM | POA: Diagnosis not present

## 2020-12-21 DIAGNOSIS — E119 Type 2 diabetes mellitus without complications: Secondary | ICD-10-CM | POA: Diagnosis not present

## 2020-12-27 DIAGNOSIS — M1990 Unspecified osteoarthritis, unspecified site: Secondary | ICD-10-CM | POA: Diagnosis not present

## 2020-12-27 DIAGNOSIS — F0390 Unspecified dementia without behavioral disturbance: Secondary | ICD-10-CM | POA: Diagnosis not present

## 2020-12-27 DIAGNOSIS — E119 Type 2 diabetes mellitus without complications: Secondary | ICD-10-CM | POA: Diagnosis not present

## 2020-12-27 DIAGNOSIS — D509 Iron deficiency anemia, unspecified: Secondary | ICD-10-CM | POA: Diagnosis not present

## 2020-12-27 DIAGNOSIS — I10 Essential (primary) hypertension: Secondary | ICD-10-CM | POA: Diagnosis not present

## 2020-12-27 DIAGNOSIS — M542 Cervicalgia: Secondary | ICD-10-CM | POA: Diagnosis not present

## 2020-12-28 DIAGNOSIS — F02818 Dementia in other diseases classified elsewhere, unspecified severity, with other behavioral disturbance: Secondary | ICD-10-CM | POA: Insufficient documentation

## 2020-12-28 DIAGNOSIS — I700 Atherosclerosis of aorta: Secondary | ICD-10-CM | POA: Diagnosis not present

## 2020-12-28 DIAGNOSIS — J90 Pleural effusion, not elsewhere classified: Secondary | ICD-10-CM | POA: Diagnosis not present

## 2020-12-28 DIAGNOSIS — M2550 Pain in unspecified joint: Secondary | ICD-10-CM | POA: Diagnosis not present

## 2020-12-28 DIAGNOSIS — I509 Heart failure, unspecified: Secondary | ICD-10-CM | POA: Diagnosis not present

## 2020-12-28 DIAGNOSIS — G4733 Obstructive sleep apnea (adult) (pediatric): Secondary | ICD-10-CM

## 2020-12-28 DIAGNOSIS — Z7401 Bed confinement status: Secondary | ICD-10-CM | POA: Diagnosis not present

## 2020-12-28 DIAGNOSIS — I517 Cardiomegaly: Secondary | ICD-10-CM | POA: Diagnosis not present

## 2020-12-28 DIAGNOSIS — R001 Bradycardia, unspecified: Secondary | ICD-10-CM | POA: Diagnosis not present

## 2020-12-28 DIAGNOSIS — I5031 Acute diastolic (congestive) heart failure: Secondary | ICD-10-CM | POA: Diagnosis present

## 2020-12-28 DIAGNOSIS — J449 Chronic obstructive pulmonary disease, unspecified: Secondary | ICD-10-CM | POA: Diagnosis present

## 2020-12-28 DIAGNOSIS — I712 Thoracic aortic aneurysm, without rupture: Secondary | ICD-10-CM | POA: Diagnosis present

## 2020-12-28 DIAGNOSIS — E669 Obesity, unspecified: Secondary | ICD-10-CM | POA: Diagnosis present

## 2020-12-28 DIAGNOSIS — R069 Unspecified abnormalities of breathing: Secondary | ICD-10-CM | POA: Diagnosis not present

## 2020-12-28 DIAGNOSIS — K7460 Unspecified cirrhosis of liver: Secondary | ICD-10-CM | POA: Diagnosis present

## 2020-12-28 DIAGNOSIS — J9811 Atelectasis: Secondary | ICD-10-CM | POA: Diagnosis not present

## 2020-12-28 DIAGNOSIS — R0902 Hypoxemia: Secondary | ICD-10-CM | POA: Diagnosis not present

## 2020-12-28 DIAGNOSIS — Z20822 Contact with and (suspected) exposure to covid-19: Secondary | ICD-10-CM | POA: Diagnosis not present

## 2020-12-28 DIAGNOSIS — I110 Hypertensive heart disease with heart failure: Secondary | ICD-10-CM | POA: Diagnosis not present

## 2020-12-28 DIAGNOSIS — R0602 Shortness of breath: Secondary | ICD-10-CM | POA: Diagnosis not present

## 2020-12-28 DIAGNOSIS — E119 Type 2 diabetes mellitus without complications: Secondary | ICD-10-CM | POA: Diagnosis present

## 2020-12-28 DIAGNOSIS — J9601 Acute respiratory failure with hypoxia: Secondary | ICD-10-CM | POA: Diagnosis not present

## 2020-12-28 DIAGNOSIS — E785 Hyperlipidemia, unspecified: Secondary | ICD-10-CM | POA: Diagnosis present

## 2020-12-28 DIAGNOSIS — G301 Alzheimer's disease with late onset: Secondary | ICD-10-CM

## 2020-12-28 DIAGNOSIS — I10 Essential (primary) hypertension: Secondary | ICD-10-CM

## 2020-12-28 DIAGNOSIS — F0281 Dementia in other diseases classified elsewhere with behavioral disturbance: Secondary | ICD-10-CM | POA: Diagnosis not present

## 2020-12-28 HISTORY — DX: Essential (primary) hypertension: I10

## 2020-12-28 HISTORY — DX: Alzheimer's disease with late onset: G30.1

## 2020-12-28 HISTORY — DX: Obstructive sleep apnea (adult) (pediatric): G47.33

## 2020-12-28 HISTORY — DX: Dementia in other diseases classified elsewhere, unspecified severity, with other behavioral disturbance: F02.818

## 2020-12-29 DIAGNOSIS — J449 Chronic obstructive pulmonary disease, unspecified: Secondary | ICD-10-CM | POA: Diagnosis present

## 2020-12-29 DIAGNOSIS — E119 Type 2 diabetes mellitus without complications: Secondary | ICD-10-CM | POA: Diagnosis present

## 2020-12-29 DIAGNOSIS — I5031 Acute diastolic (congestive) heart failure: Secondary | ICD-10-CM | POA: Diagnosis present

## 2020-12-29 DIAGNOSIS — M2550 Pain in unspecified joint: Secondary | ICD-10-CM | POA: Diagnosis not present

## 2020-12-29 DIAGNOSIS — Z7401 Bed confinement status: Secondary | ICD-10-CM | POA: Diagnosis not present

## 2020-12-29 DIAGNOSIS — G301 Alzheimer's disease with late onset: Secondary | ICD-10-CM | POA: Diagnosis present

## 2020-12-29 DIAGNOSIS — R0602 Shortness of breath: Secondary | ICD-10-CM | POA: Diagnosis not present

## 2020-12-29 DIAGNOSIS — J9601 Acute respiratory failure with hypoxia: Secondary | ICD-10-CM | POA: Diagnosis present

## 2020-12-29 DIAGNOSIS — G4733 Obstructive sleep apnea (adult) (pediatric): Secondary | ICD-10-CM | POA: Diagnosis present

## 2020-12-29 DIAGNOSIS — E669 Obesity, unspecified: Secondary | ICD-10-CM | POA: Diagnosis present

## 2020-12-29 DIAGNOSIS — Z20822 Contact with and (suspected) exposure to covid-19: Secondary | ICD-10-CM | POA: Diagnosis present

## 2020-12-29 DIAGNOSIS — E785 Hyperlipidemia, unspecified: Secondary | ICD-10-CM | POA: Diagnosis present

## 2020-12-29 DIAGNOSIS — I712 Thoracic aortic aneurysm, without rupture: Secondary | ICD-10-CM | POA: Diagnosis present

## 2020-12-29 DIAGNOSIS — I110 Hypertensive heart disease with heart failure: Secondary | ICD-10-CM | POA: Diagnosis present

## 2020-12-29 DIAGNOSIS — K7460 Unspecified cirrhosis of liver: Secondary | ICD-10-CM | POA: Diagnosis present

## 2020-12-29 DIAGNOSIS — F0281 Dementia in other diseases classified elsewhere with behavioral disturbance: Secondary | ICD-10-CM | POA: Diagnosis present

## 2021-01-03 DIAGNOSIS — M1990 Unspecified osteoarthritis, unspecified site: Secondary | ICD-10-CM | POA: Diagnosis not present

## 2021-01-03 DIAGNOSIS — I10 Essential (primary) hypertension: Secondary | ICD-10-CM | POA: Diagnosis not present

## 2021-01-03 DIAGNOSIS — E119 Type 2 diabetes mellitus without complications: Secondary | ICD-10-CM | POA: Diagnosis not present

## 2021-01-03 DIAGNOSIS — M542 Cervicalgia: Secondary | ICD-10-CM | POA: Diagnosis not present

## 2021-01-03 DIAGNOSIS — F0390 Unspecified dementia without behavioral disturbance: Secondary | ICD-10-CM | POA: Diagnosis not present

## 2021-01-03 DIAGNOSIS — D509 Iron deficiency anemia, unspecified: Secondary | ICD-10-CM | POA: Diagnosis not present

## 2021-01-04 DIAGNOSIS — I10 Essential (primary) hypertension: Secondary | ICD-10-CM | POA: Diagnosis not present

## 2021-01-04 DIAGNOSIS — R262 Difficulty in walking, not elsewhere classified: Secondary | ICD-10-CM | POA: Diagnosis not present

## 2021-01-04 DIAGNOSIS — F0280 Dementia in other diseases classified elsewhere without behavioral disturbance: Secondary | ICD-10-CM | POA: Diagnosis not present

## 2021-01-04 DIAGNOSIS — F3489 Other specified persistent mood disorders: Secondary | ICD-10-CM | POA: Diagnosis not present

## 2021-01-04 DIAGNOSIS — E038 Other specified hypothyroidism: Secondary | ICD-10-CM | POA: Diagnosis not present

## 2021-01-04 DIAGNOSIS — E118 Type 2 diabetes mellitus with unspecified complications: Secondary | ICD-10-CM | POA: Diagnosis not present

## 2021-01-04 DIAGNOSIS — N400 Enlarged prostate without lower urinary tract symptoms: Secondary | ICD-10-CM | POA: Diagnosis not present

## 2021-01-04 DIAGNOSIS — I5033 Acute on chronic diastolic (congestive) heart failure: Secondary | ICD-10-CM | POA: Diagnosis not present

## 2021-01-04 DIAGNOSIS — J9601 Acute respiratory failure with hypoxia: Secondary | ICD-10-CM | POA: Diagnosis not present

## 2021-01-06 DIAGNOSIS — D509 Iron deficiency anemia, unspecified: Secondary | ICD-10-CM | POA: Diagnosis not present

## 2021-01-06 DIAGNOSIS — M1990 Unspecified osteoarthritis, unspecified site: Secondary | ICD-10-CM | POA: Diagnosis not present

## 2021-01-06 DIAGNOSIS — E119 Type 2 diabetes mellitus without complications: Secondary | ICD-10-CM | POA: Diagnosis not present

## 2021-01-06 DIAGNOSIS — M542 Cervicalgia: Secondary | ICD-10-CM | POA: Diagnosis not present

## 2021-01-06 DIAGNOSIS — F0390 Unspecified dementia without behavioral disturbance: Secondary | ICD-10-CM | POA: Diagnosis not present

## 2021-01-06 DIAGNOSIS — I10 Essential (primary) hypertension: Secondary | ICD-10-CM | POA: Diagnosis not present

## 2021-01-07 DIAGNOSIS — D509 Iron deficiency anemia, unspecified: Secondary | ICD-10-CM | POA: Diagnosis not present

## 2021-01-07 DIAGNOSIS — F0390 Unspecified dementia without behavioral disturbance: Secondary | ICD-10-CM | POA: Diagnosis not present

## 2021-01-07 DIAGNOSIS — E119 Type 2 diabetes mellitus without complications: Secondary | ICD-10-CM | POA: Diagnosis not present

## 2021-01-07 DIAGNOSIS — M542 Cervicalgia: Secondary | ICD-10-CM | POA: Diagnosis not present

## 2021-01-07 DIAGNOSIS — I10 Essential (primary) hypertension: Secondary | ICD-10-CM | POA: Diagnosis not present

## 2021-01-07 DIAGNOSIS — M1990 Unspecified osteoarthritis, unspecified site: Secondary | ICD-10-CM | POA: Diagnosis not present

## 2021-01-09 DIAGNOSIS — I10 Essential (primary) hypertension: Secondary | ICD-10-CM | POA: Diagnosis not present

## 2021-01-09 DIAGNOSIS — M1990 Unspecified osteoarthritis, unspecified site: Secondary | ICD-10-CM | POA: Diagnosis not present

## 2021-01-09 DIAGNOSIS — M542 Cervicalgia: Secondary | ICD-10-CM | POA: Diagnosis not present

## 2021-01-09 DIAGNOSIS — F0390 Unspecified dementia without behavioral disturbance: Secondary | ICD-10-CM | POA: Diagnosis not present

## 2021-01-09 DIAGNOSIS — E119 Type 2 diabetes mellitus without complications: Secondary | ICD-10-CM | POA: Diagnosis not present

## 2021-01-09 DIAGNOSIS — D509 Iron deficiency anemia, unspecified: Secondary | ICD-10-CM | POA: Diagnosis not present

## 2021-01-10 DIAGNOSIS — G301 Alzheimer's disease with late onset: Secondary | ICD-10-CM | POA: Diagnosis not present

## 2021-01-10 DIAGNOSIS — F330 Major depressive disorder, recurrent, mild: Secondary | ICD-10-CM | POA: Diagnosis not present

## 2021-01-10 DIAGNOSIS — F5105 Insomnia due to other mental disorder: Secondary | ICD-10-CM | POA: Diagnosis not present

## 2021-01-10 DIAGNOSIS — F0281 Dementia in other diseases classified elsewhere with behavioral disturbance: Secondary | ICD-10-CM | POA: Diagnosis not present

## 2021-01-10 DIAGNOSIS — R441 Visual hallucinations: Secondary | ICD-10-CM | POA: Diagnosis not present

## 2021-01-11 DIAGNOSIS — E119 Type 2 diabetes mellitus without complications: Secondary | ICD-10-CM | POA: Diagnosis not present

## 2021-01-11 DIAGNOSIS — M542 Cervicalgia: Secondary | ICD-10-CM | POA: Diagnosis not present

## 2021-01-11 DIAGNOSIS — I10 Essential (primary) hypertension: Secondary | ICD-10-CM | POA: Diagnosis not present

## 2021-01-11 DIAGNOSIS — F0390 Unspecified dementia without behavioral disturbance: Secondary | ICD-10-CM | POA: Diagnosis not present

## 2021-01-11 DIAGNOSIS — D509 Iron deficiency anemia, unspecified: Secondary | ICD-10-CM | POA: Diagnosis not present

## 2021-01-11 DIAGNOSIS — M1990 Unspecified osteoarthritis, unspecified site: Secondary | ICD-10-CM | POA: Diagnosis not present

## 2021-01-12 DIAGNOSIS — D509 Iron deficiency anemia, unspecified: Secondary | ICD-10-CM | POA: Diagnosis not present

## 2021-01-12 DIAGNOSIS — M1990 Unspecified osteoarthritis, unspecified site: Secondary | ICD-10-CM | POA: Diagnosis not present

## 2021-01-12 DIAGNOSIS — I10 Essential (primary) hypertension: Secondary | ICD-10-CM | POA: Diagnosis not present

## 2021-01-12 DIAGNOSIS — F0390 Unspecified dementia without behavioral disturbance: Secondary | ICD-10-CM | POA: Diagnosis not present

## 2021-01-12 DIAGNOSIS — E119 Type 2 diabetes mellitus without complications: Secondary | ICD-10-CM | POA: Diagnosis not present

## 2021-01-12 DIAGNOSIS — M542 Cervicalgia: Secondary | ICD-10-CM | POA: Diagnosis not present

## 2021-01-14 DIAGNOSIS — E785 Hyperlipidemia, unspecified: Secondary | ICD-10-CM | POA: Diagnosis not present

## 2021-01-14 DIAGNOSIS — E119 Type 2 diabetes mellitus without complications: Secondary | ICD-10-CM | POA: Diagnosis not present

## 2021-01-14 DIAGNOSIS — G4733 Obstructive sleep apnea (adult) (pediatric): Secondary | ICD-10-CM | POA: Diagnosis not present

## 2021-01-14 DIAGNOSIS — E669 Obesity, unspecified: Secondary | ICD-10-CM | POA: Diagnosis not present

## 2021-01-14 DIAGNOSIS — N400 Enlarged prostate without lower urinary tract symptoms: Secondary | ICD-10-CM | POA: Diagnosis not present

## 2021-01-14 DIAGNOSIS — I0981 Rheumatic heart failure: Secondary | ICD-10-CM | POA: Diagnosis not present

## 2021-01-14 DIAGNOSIS — K219 Gastro-esophageal reflux disease without esophagitis: Secondary | ICD-10-CM | POA: Diagnosis not present

## 2021-01-14 DIAGNOSIS — J449 Chronic obstructive pulmonary disease, unspecified: Secondary | ICD-10-CM | POA: Diagnosis not present

## 2021-01-14 DIAGNOSIS — G301 Alzheimer's disease with late onset: Secondary | ICD-10-CM | POA: Diagnosis not present

## 2021-01-14 DIAGNOSIS — K7460 Unspecified cirrhosis of liver: Secondary | ICD-10-CM | POA: Diagnosis not present

## 2021-01-14 DIAGNOSIS — I5033 Acute on chronic diastolic (congestive) heart failure: Secondary | ICD-10-CM | POA: Diagnosis not present

## 2021-01-14 DIAGNOSIS — F39 Unspecified mood [affective] disorder: Secondary | ICD-10-CM | POA: Diagnosis not present

## 2021-01-14 DIAGNOSIS — G252 Other specified forms of tremor: Secondary | ICD-10-CM | POA: Diagnosis not present

## 2021-01-14 DIAGNOSIS — I700 Atherosclerosis of aorta: Secondary | ICD-10-CM | POA: Diagnosis not present

## 2021-01-14 DIAGNOSIS — F0281 Dementia in other diseases classified elsewhere with behavioral disturbance: Secondary | ICD-10-CM | POA: Diagnosis not present

## 2021-01-14 DIAGNOSIS — M47812 Spondylosis without myelopathy or radiculopathy, cervical region: Secondary | ICD-10-CM | POA: Diagnosis not present

## 2021-01-14 DIAGNOSIS — D509 Iron deficiency anemia, unspecified: Secondary | ICD-10-CM | POA: Diagnosis not present

## 2021-01-14 DIAGNOSIS — E039 Hypothyroidism, unspecified: Secondary | ICD-10-CM | POA: Diagnosis not present

## 2021-01-14 DIAGNOSIS — M170 Bilateral primary osteoarthritis of knee: Secondary | ICD-10-CM | POA: Diagnosis not present

## 2021-01-14 DIAGNOSIS — I2510 Atherosclerotic heart disease of native coronary artery without angina pectoris: Secondary | ICD-10-CM | POA: Diagnosis not present

## 2021-01-14 DIAGNOSIS — J9621 Acute and chronic respiratory failure with hypoxia: Secondary | ICD-10-CM | POA: Diagnosis not present

## 2021-01-14 DIAGNOSIS — I272 Pulmonary hypertension, unspecified: Secondary | ICD-10-CM | POA: Diagnosis not present

## 2021-01-14 DIAGNOSIS — Z8673 Personal history of transient ischemic attack (TIA), and cerebral infarction without residual deficits: Secondary | ICD-10-CM | POA: Diagnosis not present

## 2021-01-14 DIAGNOSIS — I110 Hypertensive heart disease with heart failure: Secondary | ICD-10-CM | POA: Diagnosis not present

## 2021-01-14 DIAGNOSIS — I083 Combined rheumatic disorders of mitral, aortic and tricuspid valves: Secondary | ICD-10-CM | POA: Diagnosis not present

## 2021-01-18 DIAGNOSIS — I0981 Rheumatic heart failure: Secondary | ICD-10-CM | POA: Diagnosis not present

## 2021-01-18 DIAGNOSIS — J9621 Acute and chronic respiratory failure with hypoxia: Secondary | ICD-10-CM | POA: Diagnosis not present

## 2021-01-18 DIAGNOSIS — I110 Hypertensive heart disease with heart failure: Secondary | ICD-10-CM | POA: Diagnosis not present

## 2021-01-18 DIAGNOSIS — F0281 Dementia in other diseases classified elsewhere with behavioral disturbance: Secondary | ICD-10-CM | POA: Diagnosis not present

## 2021-01-18 DIAGNOSIS — J449 Chronic obstructive pulmonary disease, unspecified: Secondary | ICD-10-CM | POA: Diagnosis not present

## 2021-01-18 DIAGNOSIS — G301 Alzheimer's disease with late onset: Secondary | ICD-10-CM | POA: Diagnosis not present

## 2021-01-20 DIAGNOSIS — F0281 Dementia in other diseases classified elsewhere with behavioral disturbance: Secondary | ICD-10-CM | POA: Diagnosis not present

## 2021-01-20 DIAGNOSIS — I0981 Rheumatic heart failure: Secondary | ICD-10-CM | POA: Diagnosis not present

## 2021-01-20 DIAGNOSIS — J449 Chronic obstructive pulmonary disease, unspecified: Secondary | ICD-10-CM | POA: Diagnosis not present

## 2021-01-20 DIAGNOSIS — J9621 Acute and chronic respiratory failure with hypoxia: Secondary | ICD-10-CM | POA: Diagnosis not present

## 2021-01-20 DIAGNOSIS — I110 Hypertensive heart disease with heart failure: Secondary | ICD-10-CM | POA: Diagnosis not present

## 2021-01-20 DIAGNOSIS — G301 Alzheimer's disease with late onset: Secondary | ICD-10-CM | POA: Diagnosis not present

## 2021-01-23 ENCOUNTER — Other Ambulatory Visit: Payer: Self-pay | Admitting: Family Medicine

## 2021-01-23 DIAGNOSIS — F0390 Unspecified dementia without behavioral disturbance: Secondary | ICD-10-CM

## 2021-01-24 DIAGNOSIS — F0281 Dementia in other diseases classified elsewhere with behavioral disturbance: Secondary | ICD-10-CM | POA: Diagnosis not present

## 2021-01-24 DIAGNOSIS — J449 Chronic obstructive pulmonary disease, unspecified: Secondary | ICD-10-CM | POA: Diagnosis not present

## 2021-01-24 DIAGNOSIS — J9621 Acute and chronic respiratory failure with hypoxia: Secondary | ICD-10-CM | POA: Diagnosis not present

## 2021-01-24 DIAGNOSIS — I0981 Rheumatic heart failure: Secondary | ICD-10-CM | POA: Diagnosis not present

## 2021-01-24 DIAGNOSIS — I110 Hypertensive heart disease with heart failure: Secondary | ICD-10-CM | POA: Diagnosis not present

## 2021-01-24 DIAGNOSIS — G301 Alzheimer's disease with late onset: Secondary | ICD-10-CM | POA: Diagnosis not present

## 2021-01-26 DIAGNOSIS — I0981 Rheumatic heart failure: Secondary | ICD-10-CM | POA: Diagnosis not present

## 2021-01-26 DIAGNOSIS — I110 Hypertensive heart disease with heart failure: Secondary | ICD-10-CM | POA: Diagnosis not present

## 2021-01-26 DIAGNOSIS — J449 Chronic obstructive pulmonary disease, unspecified: Secondary | ICD-10-CM | POA: Diagnosis not present

## 2021-01-26 DIAGNOSIS — G301 Alzheimer's disease with late onset: Secondary | ICD-10-CM | POA: Diagnosis not present

## 2021-01-26 DIAGNOSIS — F0281 Dementia in other diseases classified elsewhere with behavioral disturbance: Secondary | ICD-10-CM | POA: Diagnosis not present

## 2021-01-26 DIAGNOSIS — J9621 Acute and chronic respiratory failure with hypoxia: Secondary | ICD-10-CM | POA: Diagnosis not present

## 2021-01-27 DIAGNOSIS — J9621 Acute and chronic respiratory failure with hypoxia: Secondary | ICD-10-CM | POA: Diagnosis not present

## 2021-01-27 DIAGNOSIS — F0281 Dementia in other diseases classified elsewhere with behavioral disturbance: Secondary | ICD-10-CM | POA: Diagnosis not present

## 2021-01-27 DIAGNOSIS — G301 Alzheimer's disease with late onset: Secondary | ICD-10-CM | POA: Diagnosis not present

## 2021-01-27 DIAGNOSIS — J449 Chronic obstructive pulmonary disease, unspecified: Secondary | ICD-10-CM | POA: Diagnosis not present

## 2021-01-27 DIAGNOSIS — I110 Hypertensive heart disease with heart failure: Secondary | ICD-10-CM | POA: Diagnosis not present

## 2021-01-27 DIAGNOSIS — I0981 Rheumatic heart failure: Secondary | ICD-10-CM | POA: Diagnosis not present

## 2021-01-30 DIAGNOSIS — I110 Hypertensive heart disease with heart failure: Secondary | ICD-10-CM | POA: Diagnosis not present

## 2021-01-30 DIAGNOSIS — G301 Alzheimer's disease with late onset: Secondary | ICD-10-CM | POA: Diagnosis not present

## 2021-01-30 DIAGNOSIS — I0981 Rheumatic heart failure: Secondary | ICD-10-CM | POA: Diagnosis not present

## 2021-01-30 DIAGNOSIS — J9621 Acute and chronic respiratory failure with hypoxia: Secondary | ICD-10-CM | POA: Diagnosis not present

## 2021-01-30 DIAGNOSIS — F0281 Dementia in other diseases classified elsewhere with behavioral disturbance: Secondary | ICD-10-CM | POA: Diagnosis not present

## 2021-01-30 DIAGNOSIS — J449 Chronic obstructive pulmonary disease, unspecified: Secondary | ICD-10-CM | POA: Diagnosis not present

## 2021-02-01 DIAGNOSIS — I0981 Rheumatic heart failure: Secondary | ICD-10-CM | POA: Diagnosis not present

## 2021-02-01 DIAGNOSIS — J449 Chronic obstructive pulmonary disease, unspecified: Secondary | ICD-10-CM | POA: Diagnosis not present

## 2021-02-01 DIAGNOSIS — J9621 Acute and chronic respiratory failure with hypoxia: Secondary | ICD-10-CM | POA: Diagnosis not present

## 2021-02-01 DIAGNOSIS — F0281 Dementia in other diseases classified elsewhere with behavioral disturbance: Secondary | ICD-10-CM | POA: Diagnosis not present

## 2021-02-01 DIAGNOSIS — G301 Alzheimer's disease with late onset: Secondary | ICD-10-CM | POA: Diagnosis not present

## 2021-02-01 DIAGNOSIS — I110 Hypertensive heart disease with heart failure: Secondary | ICD-10-CM | POA: Diagnosis not present

## 2021-02-03 ENCOUNTER — Other Ambulatory Visit: Payer: Self-pay | Admitting: Family Medicine

## 2021-02-03 DIAGNOSIS — R0600 Dyspnea, unspecified: Secondary | ICD-10-CM

## 2021-02-03 DIAGNOSIS — I10 Essential (primary) hypertension: Secondary | ICD-10-CM

## 2021-02-03 DIAGNOSIS — R0609 Other forms of dyspnea: Secondary | ICD-10-CM

## 2021-02-03 MED ORDER — TELMISARTAN 20 MG PO TABS: 20.0000 mg | ORAL_TABLET | Freq: Every day | ORAL | 3 refills | Status: DC

## 2021-02-03 MED ORDER — AMLODIPINE BESYLATE 5 MG PO TABS: 5.0000 mg | ORAL_TABLET | Freq: Every day | ORAL | 3 refills | Status: DC

## 2021-02-03 MED ORDER — VENLAFAXINE HCL ER 75 MG PO CP24: ORAL_CAPSULE | ORAL | 3 refills | Status: DC

## 2021-02-03 MED ORDER — ALFUZOSIN HCL ER 10 MG PO TB24: 10.0000 mg | ORAL_TABLET | Freq: Every day | ORAL | 3 refills | Status: DC

## 2021-02-03 MED ORDER — UMECLIDINIUM BROMIDE 62.5 MCG/INH IN AEPB: 1.0000 | INHALATION_SPRAY | Freq: Every day | RESPIRATORY_TRACT | 2 refills | Status: DC

## 2021-02-04 DIAGNOSIS — J449 Chronic obstructive pulmonary disease, unspecified: Secondary | ICD-10-CM | POA: Diagnosis not present

## 2021-02-04 DIAGNOSIS — F0281 Dementia in other diseases classified elsewhere with behavioral disturbance: Secondary | ICD-10-CM | POA: Diagnosis not present

## 2021-02-04 DIAGNOSIS — I110 Hypertensive heart disease with heart failure: Secondary | ICD-10-CM | POA: Diagnosis not present

## 2021-02-04 DIAGNOSIS — I0981 Rheumatic heart failure: Secondary | ICD-10-CM | POA: Diagnosis not present

## 2021-02-04 DIAGNOSIS — G301 Alzheimer's disease with late onset: Secondary | ICD-10-CM | POA: Diagnosis not present

## 2021-02-04 DIAGNOSIS — J9621 Acute and chronic respiratory failure with hypoxia: Secondary | ICD-10-CM | POA: Diagnosis not present

## 2021-02-07 DIAGNOSIS — F0281 Dementia in other diseases classified elsewhere with behavioral disturbance: Secondary | ICD-10-CM | POA: Diagnosis not present

## 2021-02-07 DIAGNOSIS — F5105 Insomnia due to other mental disorder: Secondary | ICD-10-CM | POA: Diagnosis not present

## 2021-02-07 DIAGNOSIS — F330 Major depressive disorder, recurrent, mild: Secondary | ICD-10-CM | POA: Diagnosis not present

## 2021-02-07 DIAGNOSIS — R441 Visual hallucinations: Secondary | ICD-10-CM | POA: Diagnosis not present

## 2021-02-07 DIAGNOSIS — G301 Alzheimer's disease with late onset: Secondary | ICD-10-CM | POA: Diagnosis not present

## 2021-02-08 DIAGNOSIS — G301 Alzheimer's disease with late onset: Secondary | ICD-10-CM | POA: Diagnosis not present

## 2021-02-08 DIAGNOSIS — I0981 Rheumatic heart failure: Secondary | ICD-10-CM | POA: Diagnosis not present

## 2021-02-08 DIAGNOSIS — I110 Hypertensive heart disease with heart failure: Secondary | ICD-10-CM | POA: Diagnosis not present

## 2021-02-08 DIAGNOSIS — J449 Chronic obstructive pulmonary disease, unspecified: Secondary | ICD-10-CM | POA: Diagnosis not present

## 2021-02-08 DIAGNOSIS — F0281 Dementia in other diseases classified elsewhere with behavioral disturbance: Secondary | ICD-10-CM | POA: Diagnosis not present

## 2021-02-08 DIAGNOSIS — J9621 Acute and chronic respiratory failure with hypoxia: Secondary | ICD-10-CM | POA: Diagnosis not present

## 2021-02-13 DIAGNOSIS — G4733 Obstructive sleep apnea (adult) (pediatric): Secondary | ICD-10-CM | POA: Diagnosis not present

## 2021-02-13 DIAGNOSIS — M170 Bilateral primary osteoarthritis of knee: Secondary | ICD-10-CM | POA: Diagnosis not present

## 2021-02-13 DIAGNOSIS — I2510 Atherosclerotic heart disease of native coronary artery without angina pectoris: Secondary | ICD-10-CM | POA: Diagnosis not present

## 2021-02-13 DIAGNOSIS — K219 Gastro-esophageal reflux disease without esophagitis: Secondary | ICD-10-CM | POA: Diagnosis not present

## 2021-02-13 DIAGNOSIS — Z8673 Personal history of transient ischemic attack (TIA), and cerebral infarction without residual deficits: Secondary | ICD-10-CM | POA: Diagnosis not present

## 2021-02-13 DIAGNOSIS — F39 Unspecified mood [affective] disorder: Secondary | ICD-10-CM | POA: Diagnosis not present

## 2021-02-13 DIAGNOSIS — E669 Obesity, unspecified: Secondary | ICD-10-CM | POA: Diagnosis not present

## 2021-02-13 DIAGNOSIS — J449 Chronic obstructive pulmonary disease, unspecified: Secondary | ICD-10-CM | POA: Diagnosis not present

## 2021-02-13 DIAGNOSIS — M47812 Spondylosis without myelopathy or radiculopathy, cervical region: Secondary | ICD-10-CM | POA: Diagnosis not present

## 2021-02-13 DIAGNOSIS — G301 Alzheimer's disease with late onset: Secondary | ICD-10-CM | POA: Diagnosis not present

## 2021-02-13 DIAGNOSIS — G252 Other specified forms of tremor: Secondary | ICD-10-CM | POA: Diagnosis not present

## 2021-02-13 DIAGNOSIS — E785 Hyperlipidemia, unspecified: Secondary | ICD-10-CM | POA: Diagnosis not present

## 2021-02-13 DIAGNOSIS — I110 Hypertensive heart disease with heart failure: Secondary | ICD-10-CM | POA: Diagnosis not present

## 2021-02-13 DIAGNOSIS — I083 Combined rheumatic disorders of mitral, aortic and tricuspid valves: Secondary | ICD-10-CM | POA: Diagnosis not present

## 2021-02-13 DIAGNOSIS — E119 Type 2 diabetes mellitus without complications: Secondary | ICD-10-CM | POA: Diagnosis not present

## 2021-02-13 DIAGNOSIS — D509 Iron deficiency anemia, unspecified: Secondary | ICD-10-CM | POA: Diagnosis not present

## 2021-02-13 DIAGNOSIS — I0981 Rheumatic heart failure: Secondary | ICD-10-CM | POA: Diagnosis not present

## 2021-02-13 DIAGNOSIS — E039 Hypothyroidism, unspecified: Secondary | ICD-10-CM | POA: Diagnosis not present

## 2021-02-13 DIAGNOSIS — K7460 Unspecified cirrhosis of liver: Secondary | ICD-10-CM | POA: Diagnosis not present

## 2021-02-13 DIAGNOSIS — J9621 Acute and chronic respiratory failure with hypoxia: Secondary | ICD-10-CM | POA: Diagnosis not present

## 2021-02-13 DIAGNOSIS — N400 Enlarged prostate without lower urinary tract symptoms: Secondary | ICD-10-CM | POA: Diagnosis not present

## 2021-02-13 DIAGNOSIS — I5033 Acute on chronic diastolic (congestive) heart failure: Secondary | ICD-10-CM | POA: Diagnosis not present

## 2021-02-13 DIAGNOSIS — I272 Pulmonary hypertension, unspecified: Secondary | ICD-10-CM | POA: Diagnosis not present

## 2021-02-13 DIAGNOSIS — I700 Atherosclerosis of aorta: Secondary | ICD-10-CM | POA: Diagnosis not present

## 2021-02-13 DIAGNOSIS — F0281 Dementia in other diseases classified elsewhere with behavioral disturbance: Secondary | ICD-10-CM | POA: Diagnosis not present

## 2021-02-15 DIAGNOSIS — I110 Hypertensive heart disease with heart failure: Secondary | ICD-10-CM | POA: Diagnosis not present

## 2021-02-15 DIAGNOSIS — I0981 Rheumatic heart failure: Secondary | ICD-10-CM | POA: Diagnosis not present

## 2021-02-15 DIAGNOSIS — G301 Alzheimer's disease with late onset: Secondary | ICD-10-CM | POA: Diagnosis not present

## 2021-02-15 DIAGNOSIS — J9621 Acute and chronic respiratory failure with hypoxia: Secondary | ICD-10-CM | POA: Diagnosis not present

## 2021-02-15 DIAGNOSIS — J449 Chronic obstructive pulmonary disease, unspecified: Secondary | ICD-10-CM | POA: Diagnosis not present

## 2021-02-15 DIAGNOSIS — F0281 Dementia in other diseases classified elsewhere with behavioral disturbance: Secondary | ICD-10-CM | POA: Diagnosis not present

## 2021-02-16 DIAGNOSIS — E039 Hypothyroidism, unspecified: Secondary | ICD-10-CM | POA: Diagnosis not present

## 2021-02-22 DIAGNOSIS — E038 Other specified hypothyroidism: Secondary | ICD-10-CM | POA: Diagnosis not present

## 2021-02-22 DIAGNOSIS — R0689 Other abnormalities of breathing: Secondary | ICD-10-CM | POA: Diagnosis not present

## 2021-03-01 DIAGNOSIS — F0281 Dementia in other diseases classified elsewhere with behavioral disturbance: Secondary | ICD-10-CM | POA: Diagnosis not present

## 2021-03-01 DIAGNOSIS — G301 Alzheimer's disease with late onset: Secondary | ICD-10-CM | POA: Diagnosis not present

## 2021-03-01 DIAGNOSIS — J9621 Acute and chronic respiratory failure with hypoxia: Secondary | ICD-10-CM | POA: Diagnosis not present

## 2021-03-01 DIAGNOSIS — I0981 Rheumatic heart failure: Secondary | ICD-10-CM | POA: Diagnosis not present

## 2021-03-01 DIAGNOSIS — J449 Chronic obstructive pulmonary disease, unspecified: Secondary | ICD-10-CM | POA: Diagnosis not present

## 2021-03-01 DIAGNOSIS — I110 Hypertensive heart disease with heart failure: Secondary | ICD-10-CM | POA: Diagnosis not present

## 2021-03-07 DIAGNOSIS — R441 Visual hallucinations: Secondary | ICD-10-CM | POA: Diagnosis not present

## 2021-03-07 DIAGNOSIS — I0981 Rheumatic heart failure: Secondary | ICD-10-CM | POA: Diagnosis not present

## 2021-03-07 DIAGNOSIS — J9621 Acute and chronic respiratory failure with hypoxia: Secondary | ICD-10-CM | POA: Diagnosis not present

## 2021-03-07 DIAGNOSIS — I110 Hypertensive heart disease with heart failure: Secondary | ICD-10-CM | POA: Diagnosis not present

## 2021-03-07 DIAGNOSIS — G301 Alzheimer's disease with late onset: Secondary | ICD-10-CM | POA: Diagnosis not present

## 2021-03-07 DIAGNOSIS — J449 Chronic obstructive pulmonary disease, unspecified: Secondary | ICD-10-CM | POA: Diagnosis not present

## 2021-03-07 DIAGNOSIS — F5105 Insomnia due to other mental disorder: Secondary | ICD-10-CM | POA: Diagnosis not present

## 2021-03-07 DIAGNOSIS — F0281 Dementia in other diseases classified elsewhere with behavioral disturbance: Secondary | ICD-10-CM | POA: Diagnosis not present

## 2021-03-07 DIAGNOSIS — F330 Major depressive disorder, recurrent, mild: Secondary | ICD-10-CM | POA: Diagnosis not present

## 2021-03-13 DIAGNOSIS — I0981 Rheumatic heart failure: Secondary | ICD-10-CM | POA: Diagnosis not present

## 2021-03-13 DIAGNOSIS — J9621 Acute and chronic respiratory failure with hypoxia: Secondary | ICD-10-CM | POA: Diagnosis not present

## 2021-03-13 DIAGNOSIS — I110 Hypertensive heart disease with heart failure: Secondary | ICD-10-CM | POA: Diagnosis not present

## 2021-03-13 DIAGNOSIS — F0281 Dementia in other diseases classified elsewhere with behavioral disturbance: Secondary | ICD-10-CM | POA: Diagnosis not present

## 2021-03-13 DIAGNOSIS — J449 Chronic obstructive pulmonary disease, unspecified: Secondary | ICD-10-CM | POA: Diagnosis not present

## 2021-03-13 DIAGNOSIS — G301 Alzheimer's disease with late onset: Secondary | ICD-10-CM | POA: Diagnosis not present

## 2021-03-14 DIAGNOSIS — J449 Chronic obstructive pulmonary disease, unspecified: Secondary | ICD-10-CM | POA: Diagnosis not present

## 2021-03-14 DIAGNOSIS — I110 Hypertensive heart disease with heart failure: Secondary | ICD-10-CM | POA: Diagnosis not present

## 2021-03-14 DIAGNOSIS — I0981 Rheumatic heart failure: Secondary | ICD-10-CM | POA: Diagnosis not present

## 2021-03-14 DIAGNOSIS — J9621 Acute and chronic respiratory failure with hypoxia: Secondary | ICD-10-CM | POA: Diagnosis not present

## 2021-03-14 DIAGNOSIS — F0281 Dementia in other diseases classified elsewhere with behavioral disturbance: Secondary | ICD-10-CM | POA: Diagnosis not present

## 2021-03-14 DIAGNOSIS — G301 Alzheimer's disease with late onset: Secondary | ICD-10-CM | POA: Diagnosis not present

## 2021-03-15 DIAGNOSIS — I0981 Rheumatic heart failure: Secondary | ICD-10-CM | POA: Diagnosis not present

## 2021-03-15 DIAGNOSIS — I110 Hypertensive heart disease with heart failure: Secondary | ICD-10-CM | POA: Diagnosis not present

## 2021-03-15 DIAGNOSIS — F39 Unspecified mood [affective] disorder: Secondary | ICD-10-CM | POA: Diagnosis not present

## 2021-03-15 DIAGNOSIS — R0600 Dyspnea, unspecified: Secondary | ICD-10-CM | POA: Diagnosis not present

## 2021-03-15 DIAGNOSIS — G4733 Obstructive sleep apnea (adult) (pediatric): Secondary | ICD-10-CM | POA: Diagnosis not present

## 2021-03-15 DIAGNOSIS — I10 Essential (primary) hypertension: Secondary | ICD-10-CM | POA: Diagnosis not present

## 2021-03-15 DIAGNOSIS — Z8673 Personal history of transient ischemic attack (TIA), and cerebral infarction without residual deficits: Secondary | ICD-10-CM | POA: Diagnosis not present

## 2021-03-15 DIAGNOSIS — K7460 Unspecified cirrhosis of liver: Secondary | ICD-10-CM | POA: Diagnosis not present

## 2021-03-15 DIAGNOSIS — J449 Chronic obstructive pulmonary disease, unspecified: Secondary | ICD-10-CM | POA: Diagnosis not present

## 2021-03-15 DIAGNOSIS — I2510 Atherosclerotic heart disease of native coronary artery without angina pectoris: Secondary | ICD-10-CM | POA: Diagnosis not present

## 2021-03-15 DIAGNOSIS — N400 Enlarged prostate without lower urinary tract symptoms: Secondary | ICD-10-CM | POA: Diagnosis not present

## 2021-03-15 DIAGNOSIS — E039 Hypothyroidism, unspecified: Secondary | ICD-10-CM | POA: Diagnosis not present

## 2021-03-15 DIAGNOSIS — I700 Atherosclerosis of aorta: Secondary | ICD-10-CM | POA: Diagnosis not present

## 2021-03-15 DIAGNOSIS — I083 Combined rheumatic disorders of mitral, aortic and tricuspid valves: Secondary | ICD-10-CM | POA: Diagnosis not present

## 2021-03-15 DIAGNOSIS — F0281 Dementia in other diseases classified elsewhere with behavioral disturbance: Secondary | ICD-10-CM | POA: Diagnosis not present

## 2021-03-15 DIAGNOSIS — I5033 Acute on chronic diastolic (congestive) heart failure: Secondary | ICD-10-CM | POA: Diagnosis not present

## 2021-03-15 DIAGNOSIS — G252 Other specified forms of tremor: Secondary | ICD-10-CM | POA: Diagnosis not present

## 2021-03-15 DIAGNOSIS — E119 Type 2 diabetes mellitus without complications: Secondary | ICD-10-CM | POA: Diagnosis not present

## 2021-03-15 DIAGNOSIS — M47812 Spondylosis without myelopathy or radiculopathy, cervical region: Secondary | ICD-10-CM | POA: Diagnosis not present

## 2021-03-15 DIAGNOSIS — E785 Hyperlipidemia, unspecified: Secondary | ICD-10-CM | POA: Diagnosis not present

## 2021-03-15 DIAGNOSIS — I5032 Chronic diastolic (congestive) heart failure: Secondary | ICD-10-CM | POA: Diagnosis not present

## 2021-03-15 DIAGNOSIS — J9611 Chronic respiratory failure with hypoxia: Secondary | ICD-10-CM | POA: Diagnosis not present

## 2021-03-15 DIAGNOSIS — G301 Alzheimer's disease with late onset: Secondary | ICD-10-CM | POA: Diagnosis not present

## 2021-03-15 DIAGNOSIS — I272 Pulmonary hypertension, unspecified: Secondary | ICD-10-CM | POA: Diagnosis not present

## 2021-03-15 DIAGNOSIS — E669 Obesity, unspecified: Secondary | ICD-10-CM | POA: Diagnosis not present

## 2021-03-15 DIAGNOSIS — M170 Bilateral primary osteoarthritis of knee: Secondary | ICD-10-CM | POA: Diagnosis not present

## 2021-03-15 DIAGNOSIS — L03115 Cellulitis of right lower limb: Secondary | ICD-10-CM | POA: Diagnosis not present

## 2021-03-15 DIAGNOSIS — D509 Iron deficiency anemia, unspecified: Secondary | ICD-10-CM | POA: Diagnosis not present

## 2021-03-15 DIAGNOSIS — K219 Gastro-esophageal reflux disease without esophagitis: Secondary | ICD-10-CM | POA: Diagnosis not present

## 2021-03-16 DIAGNOSIS — I0981 Rheumatic heart failure: Secondary | ICD-10-CM | POA: Diagnosis not present

## 2021-03-16 DIAGNOSIS — J984 Other disorders of lung: Secondary | ICD-10-CM | POA: Diagnosis not present

## 2021-03-16 DIAGNOSIS — J9611 Chronic respiratory failure with hypoxia: Secondary | ICD-10-CM | POA: Diagnosis not present

## 2021-03-16 DIAGNOSIS — G301 Alzheimer's disease with late onset: Secondary | ICD-10-CM | POA: Diagnosis not present

## 2021-03-16 DIAGNOSIS — R0902 Hypoxemia: Secondary | ICD-10-CM | POA: Diagnosis not present

## 2021-03-16 DIAGNOSIS — I5033 Acute on chronic diastolic (congestive) heart failure: Secondary | ICD-10-CM | POA: Diagnosis not present

## 2021-03-16 DIAGNOSIS — J449 Chronic obstructive pulmonary disease, unspecified: Secondary | ICD-10-CM | POA: Diagnosis not present

## 2021-03-16 DIAGNOSIS — I110 Hypertensive heart disease with heart failure: Secondary | ICD-10-CM | POA: Diagnosis not present

## 2021-03-21 DIAGNOSIS — F0281 Dementia in other diseases classified elsewhere with behavioral disturbance: Secondary | ICD-10-CM | POA: Diagnosis not present

## 2021-03-21 DIAGNOSIS — I0981 Rheumatic heart failure: Secondary | ICD-10-CM | POA: Diagnosis not present

## 2021-03-21 DIAGNOSIS — R441 Visual hallucinations: Secondary | ICD-10-CM | POA: Diagnosis not present

## 2021-03-21 DIAGNOSIS — I5033 Acute on chronic diastolic (congestive) heart failure: Secondary | ICD-10-CM | POA: Diagnosis not present

## 2021-03-21 DIAGNOSIS — I10 Essential (primary) hypertension: Secondary | ICD-10-CM | POA: Diagnosis not present

## 2021-03-21 DIAGNOSIS — G301 Alzheimer's disease with late onset: Secondary | ICD-10-CM | POA: Diagnosis not present

## 2021-03-21 DIAGNOSIS — J9611 Chronic respiratory failure with hypoxia: Secondary | ICD-10-CM | POA: Diagnosis not present

## 2021-03-21 DIAGNOSIS — I110 Hypertensive heart disease with heart failure: Secondary | ICD-10-CM | POA: Diagnosis not present

## 2021-03-21 DIAGNOSIS — F330 Major depressive disorder, recurrent, mild: Secondary | ICD-10-CM | POA: Diagnosis not present

## 2021-03-21 DIAGNOSIS — F5105 Insomnia due to other mental disorder: Secondary | ICD-10-CM | POA: Diagnosis not present

## 2021-03-21 DIAGNOSIS — J449 Chronic obstructive pulmonary disease, unspecified: Secondary | ICD-10-CM | POA: Diagnosis not present

## 2021-03-22 DIAGNOSIS — J984 Other disorders of lung: Secondary | ICD-10-CM | POA: Diagnosis not present

## 2021-03-22 DIAGNOSIS — I10 Essential (primary) hypertension: Secondary | ICD-10-CM | POA: Diagnosis not present

## 2021-03-24 DIAGNOSIS — J9611 Chronic respiratory failure with hypoxia: Secondary | ICD-10-CM | POA: Diagnosis not present

## 2021-03-24 DIAGNOSIS — I110 Hypertensive heart disease with heart failure: Secondary | ICD-10-CM | POA: Diagnosis not present

## 2021-03-24 DIAGNOSIS — I0981 Rheumatic heart failure: Secondary | ICD-10-CM | POA: Diagnosis not present

## 2021-03-24 DIAGNOSIS — G301 Alzheimer's disease with late onset: Secondary | ICD-10-CM | POA: Diagnosis not present

## 2021-03-24 DIAGNOSIS — I5033 Acute on chronic diastolic (congestive) heart failure: Secondary | ICD-10-CM | POA: Diagnosis not present

## 2021-03-24 DIAGNOSIS — J449 Chronic obstructive pulmonary disease, unspecified: Secondary | ICD-10-CM | POA: Diagnosis not present

## 2021-03-28 DIAGNOSIS — I10 Essential (primary) hypertension: Secondary | ICD-10-CM | POA: Diagnosis not present

## 2021-03-28 DIAGNOSIS — E039 Hypothyroidism, unspecified: Secondary | ICD-10-CM | POA: Diagnosis not present

## 2021-03-28 DIAGNOSIS — E785 Hyperlipidemia, unspecified: Secondary | ICD-10-CM | POA: Diagnosis not present

## 2021-03-29 DIAGNOSIS — J9611 Chronic respiratory failure with hypoxia: Secondary | ICD-10-CM | POA: Diagnosis not present

## 2021-03-29 DIAGNOSIS — I110 Hypertensive heart disease with heart failure: Secondary | ICD-10-CM | POA: Diagnosis not present

## 2021-03-29 DIAGNOSIS — G301 Alzheimer's disease with late onset: Secondary | ICD-10-CM | POA: Diagnosis not present

## 2021-03-29 DIAGNOSIS — I10 Essential (primary) hypertension: Secondary | ICD-10-CM | POA: Diagnosis not present

## 2021-03-29 DIAGNOSIS — I5033 Acute on chronic diastolic (congestive) heart failure: Secondary | ICD-10-CM | POA: Diagnosis not present

## 2021-03-29 DIAGNOSIS — I5032 Chronic diastolic (congestive) heart failure: Secondary | ICD-10-CM | POA: Diagnosis not present

## 2021-03-29 DIAGNOSIS — M25562 Pain in left knee: Secondary | ICD-10-CM | POA: Diagnosis not present

## 2021-03-29 DIAGNOSIS — I0981 Rheumatic heart failure: Secondary | ICD-10-CM | POA: Diagnosis not present

## 2021-03-29 DIAGNOSIS — J449 Chronic obstructive pulmonary disease, unspecified: Secondary | ICD-10-CM | POA: Diagnosis not present

## 2021-03-30 ENCOUNTER — Telehealth: Payer: Self-pay | Admitting: Cardiology

## 2021-04-04 DIAGNOSIS — J9611 Chronic respiratory failure with hypoxia: Secondary | ICD-10-CM | POA: Diagnosis not present

## 2021-04-04 DIAGNOSIS — R441 Visual hallucinations: Secondary | ICD-10-CM | POA: Diagnosis not present

## 2021-04-04 DIAGNOSIS — I110 Hypertensive heart disease with heart failure: Secondary | ICD-10-CM | POA: Diagnosis not present

## 2021-04-04 DIAGNOSIS — J449 Chronic obstructive pulmonary disease, unspecified: Secondary | ICD-10-CM | POA: Diagnosis not present

## 2021-04-04 DIAGNOSIS — I5033 Acute on chronic diastolic (congestive) heart failure: Secondary | ICD-10-CM | POA: Diagnosis not present

## 2021-04-04 DIAGNOSIS — F0281 Dementia in other diseases classified elsewhere with behavioral disturbance: Secondary | ICD-10-CM | POA: Diagnosis not present

## 2021-04-04 DIAGNOSIS — F330 Major depressive disorder, recurrent, mild: Secondary | ICD-10-CM | POA: Diagnosis not present

## 2021-04-04 DIAGNOSIS — F5105 Insomnia due to other mental disorder: Secondary | ICD-10-CM | POA: Diagnosis not present

## 2021-04-04 DIAGNOSIS — I0981 Rheumatic heart failure: Secondary | ICD-10-CM | POA: Diagnosis not present

## 2021-04-04 DIAGNOSIS — G301 Alzheimer's disease with late onset: Secondary | ICD-10-CM | POA: Diagnosis not present

## 2021-04-12 DIAGNOSIS — J449 Chronic obstructive pulmonary disease, unspecified: Secondary | ICD-10-CM | POA: Diagnosis not present

## 2021-04-12 DIAGNOSIS — I0981 Rheumatic heart failure: Secondary | ICD-10-CM | POA: Diagnosis not present

## 2021-04-12 DIAGNOSIS — I5033 Acute on chronic diastolic (congestive) heart failure: Secondary | ICD-10-CM | POA: Diagnosis not present

## 2021-04-12 DIAGNOSIS — G301 Alzheimer's disease with late onset: Secondary | ICD-10-CM | POA: Diagnosis not present

## 2021-04-12 DIAGNOSIS — J9611 Chronic respiratory failure with hypoxia: Secondary | ICD-10-CM | POA: Diagnosis not present

## 2021-04-12 DIAGNOSIS — I110 Hypertensive heart disease with heart failure: Secondary | ICD-10-CM | POA: Diagnosis not present

## 2021-04-14 DIAGNOSIS — M47812 Spondylosis without myelopathy or radiculopathy, cervical region: Secondary | ICD-10-CM | POA: Diagnosis not present

## 2021-04-14 DIAGNOSIS — E119 Type 2 diabetes mellitus without complications: Secondary | ICD-10-CM | POA: Diagnosis not present

## 2021-04-14 DIAGNOSIS — E039 Hypothyroidism, unspecified: Secondary | ICD-10-CM | POA: Diagnosis not present

## 2021-04-14 DIAGNOSIS — I083 Combined rheumatic disorders of mitral, aortic and tricuspid valves: Secondary | ICD-10-CM | POA: Diagnosis not present

## 2021-04-14 DIAGNOSIS — I0981 Rheumatic heart failure: Secondary | ICD-10-CM | POA: Diagnosis not present

## 2021-04-14 DIAGNOSIS — I272 Pulmonary hypertension, unspecified: Secondary | ICD-10-CM | POA: Diagnosis not present

## 2021-04-14 DIAGNOSIS — Z8673 Personal history of transient ischemic attack (TIA), and cerebral infarction without residual deficits: Secondary | ICD-10-CM | POA: Diagnosis not present

## 2021-04-14 DIAGNOSIS — G4733 Obstructive sleep apnea (adult) (pediatric): Secondary | ICD-10-CM | POA: Diagnosis not present

## 2021-04-14 DIAGNOSIS — G301 Alzheimer's disease with late onset: Secondary | ICD-10-CM | POA: Diagnosis not present

## 2021-04-14 DIAGNOSIS — M170 Bilateral primary osteoarthritis of knee: Secondary | ICD-10-CM | POA: Diagnosis not present

## 2021-04-14 DIAGNOSIS — I700 Atherosclerosis of aorta: Secondary | ICD-10-CM | POA: Diagnosis not present

## 2021-04-14 DIAGNOSIS — F0281 Dementia in other diseases classified elsewhere with behavioral disturbance: Secondary | ICD-10-CM | POA: Diagnosis not present

## 2021-04-14 DIAGNOSIS — I5033 Acute on chronic diastolic (congestive) heart failure: Secondary | ICD-10-CM | POA: Diagnosis not present

## 2021-04-14 DIAGNOSIS — K7460 Unspecified cirrhosis of liver: Secondary | ICD-10-CM | POA: Diagnosis not present

## 2021-04-14 DIAGNOSIS — D509 Iron deficiency anemia, unspecified: Secondary | ICD-10-CM | POA: Diagnosis not present

## 2021-04-14 DIAGNOSIS — J449 Chronic obstructive pulmonary disease, unspecified: Secondary | ICD-10-CM | POA: Diagnosis not present

## 2021-04-14 DIAGNOSIS — G252 Other specified forms of tremor: Secondary | ICD-10-CM | POA: Diagnosis not present

## 2021-04-14 DIAGNOSIS — K219 Gastro-esophageal reflux disease without esophagitis: Secondary | ICD-10-CM | POA: Diagnosis not present

## 2021-04-14 DIAGNOSIS — N400 Enlarged prostate without lower urinary tract symptoms: Secondary | ICD-10-CM | POA: Diagnosis not present

## 2021-04-14 DIAGNOSIS — J9611 Chronic respiratory failure with hypoxia: Secondary | ICD-10-CM | POA: Diagnosis not present

## 2021-04-14 DIAGNOSIS — F39 Unspecified mood [affective] disorder: Secondary | ICD-10-CM | POA: Diagnosis not present

## 2021-04-14 DIAGNOSIS — E785 Hyperlipidemia, unspecified: Secondary | ICD-10-CM | POA: Diagnosis not present

## 2021-04-14 DIAGNOSIS — I110 Hypertensive heart disease with heart failure: Secondary | ICD-10-CM | POA: Diagnosis not present

## 2021-04-14 DIAGNOSIS — I2510 Atherosclerotic heart disease of native coronary artery without angina pectoris: Secondary | ICD-10-CM | POA: Diagnosis not present

## 2021-04-14 DIAGNOSIS — E669 Obesity, unspecified: Secondary | ICD-10-CM | POA: Diagnosis not present

## 2021-04-19 DIAGNOSIS — G301 Alzheimer's disease with late onset: Secondary | ICD-10-CM | POA: Diagnosis not present

## 2021-04-19 DIAGNOSIS — I0981 Rheumatic heart failure: Secondary | ICD-10-CM | POA: Diagnosis not present

## 2021-04-19 DIAGNOSIS — I5033 Acute on chronic diastolic (congestive) heart failure: Secondary | ICD-10-CM | POA: Diagnosis not present

## 2021-04-19 DIAGNOSIS — J9611 Chronic respiratory failure with hypoxia: Secondary | ICD-10-CM | POA: Diagnosis not present

## 2021-04-19 DIAGNOSIS — I110 Hypertensive heart disease with heart failure: Secondary | ICD-10-CM | POA: Diagnosis not present

## 2021-04-19 DIAGNOSIS — J449 Chronic obstructive pulmonary disease, unspecified: Secondary | ICD-10-CM | POA: Diagnosis not present

## 2021-04-26 DIAGNOSIS — J449 Chronic obstructive pulmonary disease, unspecified: Secondary | ICD-10-CM | POA: Diagnosis not present

## 2021-04-26 DIAGNOSIS — G301 Alzheimer's disease with late onset: Secondary | ICD-10-CM | POA: Diagnosis not present

## 2021-04-26 DIAGNOSIS — I110 Hypertensive heart disease with heart failure: Secondary | ICD-10-CM | POA: Diagnosis not present

## 2021-04-26 DIAGNOSIS — I0981 Rheumatic heart failure: Secondary | ICD-10-CM | POA: Diagnosis not present

## 2021-04-26 DIAGNOSIS — I5033 Acute on chronic diastolic (congestive) heart failure: Secondary | ICD-10-CM | POA: Diagnosis not present

## 2021-04-26 DIAGNOSIS — J9611 Chronic respiratory failure with hypoxia: Secondary | ICD-10-CM | POA: Diagnosis not present

## 2021-04-27 ENCOUNTER — Other Ambulatory Visit: Payer: Self-pay | Admitting: Family Medicine

## 2021-05-02 DIAGNOSIS — G251 Drug-induced tremor: Secondary | ICD-10-CM | POA: Diagnosis not present

## 2021-05-02 DIAGNOSIS — F0281 Dementia in other diseases classified elsewhere with behavioral disturbance: Secondary | ICD-10-CM | POA: Diagnosis not present

## 2021-05-02 DIAGNOSIS — F330 Major depressive disorder, recurrent, mild: Secondary | ICD-10-CM | POA: Diagnosis not present

## 2021-05-02 DIAGNOSIS — G301 Alzheimer's disease with late onset: Secondary | ICD-10-CM | POA: Diagnosis not present

## 2021-05-02 DIAGNOSIS — R441 Visual hallucinations: Secondary | ICD-10-CM | POA: Diagnosis not present

## 2021-05-02 DIAGNOSIS — F5105 Insomnia due to other mental disorder: Secondary | ICD-10-CM | POA: Diagnosis not present

## 2021-05-04 DIAGNOSIS — I0981 Rheumatic heart failure: Secondary | ICD-10-CM | POA: Diagnosis not present

## 2021-05-04 DIAGNOSIS — G301 Alzheimer's disease with late onset: Secondary | ICD-10-CM | POA: Diagnosis not present

## 2021-05-04 DIAGNOSIS — I110 Hypertensive heart disease with heart failure: Secondary | ICD-10-CM | POA: Diagnosis not present

## 2021-05-04 DIAGNOSIS — J449 Chronic obstructive pulmonary disease, unspecified: Secondary | ICD-10-CM | POA: Diagnosis not present

## 2021-05-04 DIAGNOSIS — I5033 Acute on chronic diastolic (congestive) heart failure: Secondary | ICD-10-CM | POA: Diagnosis not present

## 2021-05-04 DIAGNOSIS — J9611 Chronic respiratory failure with hypoxia: Secondary | ICD-10-CM | POA: Diagnosis not present

## 2021-05-12 DIAGNOSIS — J449 Chronic obstructive pulmonary disease, unspecified: Secondary | ICD-10-CM | POA: Diagnosis not present

## 2021-05-12 DIAGNOSIS — I5033 Acute on chronic diastolic (congestive) heart failure: Secondary | ICD-10-CM | POA: Diagnosis not present

## 2021-05-12 DIAGNOSIS — I110 Hypertensive heart disease with heart failure: Secondary | ICD-10-CM | POA: Diagnosis not present

## 2021-05-12 DIAGNOSIS — J9611 Chronic respiratory failure with hypoxia: Secondary | ICD-10-CM | POA: Diagnosis not present

## 2021-05-12 DIAGNOSIS — G301 Alzheimer's disease with late onset: Secondary | ICD-10-CM | POA: Diagnosis not present

## 2021-05-12 DIAGNOSIS — I0981 Rheumatic heart failure: Secondary | ICD-10-CM | POA: Diagnosis not present

## 2021-05-15 DIAGNOSIS — R0689 Other abnormalities of breathing: Secondary | ICD-10-CM | POA: Diagnosis not present

## 2021-05-15 DIAGNOSIS — E118 Type 2 diabetes mellitus with unspecified complications: Secondary | ICD-10-CM | POA: Diagnosis not present

## 2021-05-15 DIAGNOSIS — F39 Unspecified mood [affective] disorder: Secondary | ICD-10-CM | POA: Diagnosis not present

## 2021-05-15 DIAGNOSIS — J984 Other disorders of lung: Secondary | ICD-10-CM | POA: Diagnosis not present

## 2021-05-15 DIAGNOSIS — I10 Essential (primary) hypertension: Secondary | ICD-10-CM | POA: Diagnosis not present

## 2021-05-15 DIAGNOSIS — I5032 Chronic diastolic (congestive) heart failure: Secondary | ICD-10-CM | POA: Diagnosis not present

## 2021-05-15 DIAGNOSIS — D473 Essential (hemorrhagic) thrombocythemia: Secondary | ICD-10-CM | POA: Diagnosis not present

## 2021-05-15 DIAGNOSIS — J9611 Chronic respiratory failure with hypoxia: Secondary | ICD-10-CM | POA: Diagnosis not present

## 2021-05-15 DIAGNOSIS — M25561 Pain in right knee: Secondary | ICD-10-CM | POA: Diagnosis not present

## 2021-05-15 DIAGNOSIS — N189 Chronic kidney disease, unspecified: Secondary | ICD-10-CM | POA: Diagnosis not present

## 2021-05-15 DIAGNOSIS — N400 Enlarged prostate without lower urinary tract symptoms: Secondary | ICD-10-CM | POA: Diagnosis not present

## 2021-05-15 DIAGNOSIS — R262 Difficulty in walking, not elsewhere classified: Secondary | ICD-10-CM | POA: Diagnosis not present

## 2021-05-16 DIAGNOSIS — F5105 Insomnia due to other mental disorder: Secondary | ICD-10-CM | POA: Diagnosis not present

## 2021-05-16 DIAGNOSIS — R441 Visual hallucinations: Secondary | ICD-10-CM | POA: Diagnosis not present

## 2021-05-16 DIAGNOSIS — F0281 Dementia in other diseases classified elsewhere with behavioral disturbance: Secondary | ICD-10-CM | POA: Diagnosis not present

## 2021-05-16 DIAGNOSIS — G301 Alzheimer's disease with late onset: Secondary | ICD-10-CM | POA: Diagnosis not present

## 2021-05-16 DIAGNOSIS — F330 Major depressive disorder, recurrent, mild: Secondary | ICD-10-CM | POA: Diagnosis not present

## 2021-05-18 DIAGNOSIS — B351 Tinea unguium: Secondary | ICD-10-CM | POA: Diagnosis not present

## 2021-05-18 DIAGNOSIS — M79675 Pain in left toe(s): Secondary | ICD-10-CM | POA: Diagnosis not present

## 2021-05-18 DIAGNOSIS — M79674 Pain in right toe(s): Secondary | ICD-10-CM | POA: Diagnosis not present

## 2021-05-19 ENCOUNTER — Other Ambulatory Visit: Payer: Self-pay | Admitting: Family Medicine

## 2021-05-19 DIAGNOSIS — R441 Visual hallucinations: Secondary | ICD-10-CM

## 2021-05-23 ENCOUNTER — Other Ambulatory Visit: Payer: Self-pay

## 2021-05-23 DIAGNOSIS — R269 Unspecified abnormalities of gait and mobility: Secondary | ICD-10-CM | POA: Insufficient documentation

## 2021-05-23 DIAGNOSIS — E119 Type 2 diabetes mellitus without complications: Secondary | ICD-10-CM | POA: Insufficient documentation

## 2021-05-23 DIAGNOSIS — M1990 Unspecified osteoarthritis, unspecified site: Secondary | ICD-10-CM | POA: Insufficient documentation

## 2021-05-23 DIAGNOSIS — Z972 Presence of dental prosthetic device (complete) (partial): Secondary | ICD-10-CM | POA: Insufficient documentation

## 2021-05-23 DIAGNOSIS — D509 Iron deficiency anemia, unspecified: Secondary | ICD-10-CM | POA: Insufficient documentation

## 2021-05-23 DIAGNOSIS — R011 Cardiac murmur, unspecified: Secondary | ICD-10-CM | POA: Insufficient documentation

## 2021-05-23 DIAGNOSIS — E785 Hyperlipidemia, unspecified: Secondary | ICD-10-CM | POA: Insufficient documentation

## 2021-05-23 DIAGNOSIS — R413 Other amnesia: Secondary | ICD-10-CM | POA: Insufficient documentation

## 2021-05-23 DIAGNOSIS — K08109 Complete loss of teeth, unspecified cause, unspecified class: Secondary | ICD-10-CM | POA: Insufficient documentation

## 2021-05-23 DIAGNOSIS — I10 Essential (primary) hypertension: Secondary | ICD-10-CM | POA: Diagnosis not present

## 2021-05-23 DIAGNOSIS — K219 Gastro-esophageal reflux disease without esophagitis: Secondary | ICD-10-CM | POA: Insufficient documentation

## 2021-05-23 DIAGNOSIS — N401 Enlarged prostate with lower urinary tract symptoms: Secondary | ICD-10-CM | POA: Insufficient documentation

## 2021-05-23 DIAGNOSIS — D649 Anemia, unspecified: Secondary | ICD-10-CM | POA: Diagnosis not present

## 2021-05-24 DIAGNOSIS — J984 Other disorders of lung: Secondary | ICD-10-CM | POA: Diagnosis not present

## 2021-05-24 DIAGNOSIS — I5032 Chronic diastolic (congestive) heart failure: Secondary | ICD-10-CM | POA: Diagnosis not present

## 2021-05-24 DIAGNOSIS — R29898 Other symptoms and signs involving the musculoskeletal system: Secondary | ICD-10-CM | POA: Diagnosis not present

## 2021-05-25 ENCOUNTER — Ambulatory Visit: Payer: Medicare Other | Admitting: Cardiology

## 2021-05-25 DIAGNOSIS — F0390 Unspecified dementia without behavioral disturbance: Secondary | ICD-10-CM | POA: Diagnosis not present

## 2021-05-25 DIAGNOSIS — D509 Iron deficiency anemia, unspecified: Secondary | ICD-10-CM | POA: Diagnosis not present

## 2021-05-25 DIAGNOSIS — E785 Hyperlipidemia, unspecified: Secondary | ICD-10-CM | POA: Diagnosis not present

## 2021-05-25 DIAGNOSIS — E119 Type 2 diabetes mellitus without complications: Secondary | ICD-10-CM | POA: Diagnosis not present

## 2021-05-25 DIAGNOSIS — Z9181 History of falling: Secondary | ICD-10-CM | POA: Diagnosis not present

## 2021-05-25 DIAGNOSIS — M47812 Spondylosis without myelopathy or radiculopathy, cervical region: Secondary | ICD-10-CM | POA: Diagnosis not present

## 2021-05-25 DIAGNOSIS — E039 Hypothyroidism, unspecified: Secondary | ICD-10-CM | POA: Diagnosis not present

## 2021-05-25 DIAGNOSIS — D519 Vitamin B12 deficiency anemia, unspecified: Secondary | ICD-10-CM | POA: Diagnosis not present

## 2021-05-25 DIAGNOSIS — I5032 Chronic diastolic (congestive) heart failure: Secondary | ICD-10-CM | POA: Diagnosis not present

## 2021-05-25 DIAGNOSIS — E669 Obesity, unspecified: Secondary | ICD-10-CM | POA: Diagnosis not present

## 2021-05-25 DIAGNOSIS — I110 Hypertensive heart disease with heart failure: Secondary | ICD-10-CM | POA: Diagnosis not present

## 2021-05-25 DIAGNOSIS — Z8673 Personal history of transient ischemic attack (TIA), and cerebral infarction without residual deficits: Secondary | ICD-10-CM | POA: Diagnosis not present

## 2021-05-25 DIAGNOSIS — Z8616 Personal history of COVID-19: Secondary | ICD-10-CM | POA: Diagnosis not present

## 2021-05-25 DIAGNOSIS — I272 Pulmonary hypertension, unspecified: Secondary | ICD-10-CM | POA: Diagnosis not present

## 2021-05-25 DIAGNOSIS — N400 Enlarged prostate without lower urinary tract symptoms: Secondary | ICD-10-CM | POA: Diagnosis not present

## 2021-05-25 DIAGNOSIS — K219 Gastro-esophageal reflux disease without esophagitis: Secondary | ICD-10-CM | POA: Diagnosis not present

## 2021-05-25 DIAGNOSIS — I2510 Atherosclerotic heart disease of native coronary artery without angina pectoris: Secondary | ICD-10-CM | POA: Diagnosis not present

## 2021-05-25 DIAGNOSIS — N529 Male erectile dysfunction, unspecified: Secondary | ICD-10-CM | POA: Diagnosis not present

## 2021-05-25 DIAGNOSIS — M170 Bilateral primary osteoarthritis of knee: Secondary | ICD-10-CM | POA: Diagnosis not present

## 2021-05-25 DIAGNOSIS — Z9981 Dependence on supplemental oxygen: Secondary | ICD-10-CM | POA: Diagnosis not present

## 2021-05-25 DIAGNOSIS — Z6831 Body mass index (BMI) 31.0-31.9, adult: Secondary | ICD-10-CM | POA: Diagnosis not present

## 2021-05-25 DIAGNOSIS — G252 Other specified forms of tremor: Secondary | ICD-10-CM | POA: Diagnosis not present

## 2021-05-25 DIAGNOSIS — J984 Other disorders of lung: Secondary | ICD-10-CM | POA: Diagnosis not present

## 2021-05-26 DIAGNOSIS — N401 Enlarged prostate with lower urinary tract symptoms: Secondary | ICD-10-CM | POA: Diagnosis not present

## 2021-05-29 ENCOUNTER — Telehealth: Payer: Self-pay | Admitting: Family Medicine

## 2021-05-29 DIAGNOSIS — M19012 Primary osteoarthritis, left shoulder: Secondary | ICD-10-CM | POA: Diagnosis not present

## 2021-05-29 DIAGNOSIS — R404 Transient alteration of awareness: Secondary | ICD-10-CM | POA: Diagnosis not present

## 2021-05-29 DIAGNOSIS — F0390 Unspecified dementia without behavioral disturbance: Secondary | ICD-10-CM | POA: Diagnosis not present

## 2021-05-29 DIAGNOSIS — M25512 Pain in left shoulder: Secondary | ICD-10-CM | POA: Diagnosis not present

## 2021-05-29 DIAGNOSIS — I517 Cardiomegaly: Secondary | ICD-10-CM | POA: Diagnosis not present

## 2021-05-29 DIAGNOSIS — R58 Hemorrhage, not elsewhere classified: Secondary | ICD-10-CM | POA: Diagnosis not present

## 2021-05-29 DIAGNOSIS — R079 Chest pain, unspecified: Secondary | ICD-10-CM | POA: Diagnosis not present

## 2021-05-29 DIAGNOSIS — R0789 Other chest pain: Secondary | ICD-10-CM | POA: Diagnosis not present

## 2021-05-29 DIAGNOSIS — Z043 Encounter for examination and observation following other accident: Secondary | ICD-10-CM | POA: Diagnosis not present

## 2021-05-29 DIAGNOSIS — I10 Essential (primary) hypertension: Secondary | ICD-10-CM | POA: Diagnosis not present

## 2021-05-29 DIAGNOSIS — M79603 Pain in arm, unspecified: Secondary | ICD-10-CM | POA: Diagnosis not present

## 2021-05-29 DIAGNOSIS — R600 Localized edema: Secondary | ICD-10-CM | POA: Diagnosis not present

## 2021-05-29 DIAGNOSIS — M25522 Pain in left elbow: Secondary | ICD-10-CM | POA: Diagnosis not present

## 2021-05-29 NOTE — Telephone Encounter (Signed)
Called the daughter [name] no answer/mailbox was full

## 2021-05-29 NOTE — Telephone Encounter (Signed)
Patients daughter states that pt had to go to the ER this morning and they are needing questions answered concerning a few of his medications. Please call daughter [name]) at [phone]

## 2021-05-30 DIAGNOSIS — G301 Alzheimer's disease with late onset: Secondary | ICD-10-CM | POA: Diagnosis not present

## 2021-05-30 DIAGNOSIS — F0390 Unspecified dementia without behavioral disturbance: Secondary | ICD-10-CM | POA: Diagnosis not present

## 2021-05-30 DIAGNOSIS — F330 Major depressive disorder, recurrent, mild: Secondary | ICD-10-CM | POA: Diagnosis not present

## 2021-05-30 DIAGNOSIS — F5105 Insomnia due to other mental disorder: Secondary | ICD-10-CM | POA: Diagnosis not present

## 2021-05-30 DIAGNOSIS — F0281 Dementia in other diseases classified elsewhere with behavioral disturbance: Secondary | ICD-10-CM | POA: Diagnosis not present

## 2021-05-30 DIAGNOSIS — I5032 Chronic diastolic (congestive) heart failure: Secondary | ICD-10-CM | POA: Diagnosis not present

## 2021-05-30 DIAGNOSIS — I110 Hypertensive heart disease with heart failure: Secondary | ICD-10-CM | POA: Diagnosis not present

## 2021-05-30 DIAGNOSIS — D509 Iron deficiency anemia, unspecified: Secondary | ICD-10-CM | POA: Diagnosis not present

## 2021-05-30 DIAGNOSIS — M170 Bilateral primary osteoarthritis of knee: Secondary | ICD-10-CM | POA: Diagnosis not present

## 2021-05-30 DIAGNOSIS — R441 Visual hallucinations: Secondary | ICD-10-CM | POA: Diagnosis not present

## 2021-05-30 DIAGNOSIS — I272 Pulmonary hypertension, unspecified: Secondary | ICD-10-CM | POA: Diagnosis not present

## 2021-05-30 DIAGNOSIS — E119 Type 2 diabetes mellitus without complications: Secondary | ICD-10-CM | POA: Diagnosis not present

## 2021-05-30 NOTE — Telephone Encounter (Signed)
Spoke to the daughter  The patient has had several falls in the past few weeks and was taken to the ER yesterday around 2 AM. The ER MD suggested that maybe the Alfuzosin may be the cause.  The daughter wanted to know if this could be a potential problem the ER MD suggested stopping it.  The daughter wanted PCP's opinion on this and if he agrees would it be replaced??

## 2021-05-30 NOTE — Telephone Encounter (Signed)
Called the daughter informed of PCP instructions. She also stated that in January he was in the hospital and was taken off propanolol. He started having tremors again. Also the Nursing home started him on 05/04/21 Benztropine. She wanted PCP to be aware of these changes.

## 2021-05-30 NOTE — Telephone Encounter (Signed)
Could be, but at his age tough to tell for sure. Let's stop it. Monitor blood pressures and prostate symptoms. If no changes, we can stay off of it. Ty.

## 2021-05-30 NOTE — Addendum Note (Signed)
Addended by: Ames Coupe on: 05/30/2021 12:15 PM   Modules accepted: Orders

## 2021-05-31 DIAGNOSIS — R296 Repeated falls: Secondary | ICD-10-CM | POA: Diagnosis not present

## 2021-05-31 DIAGNOSIS — R079 Chest pain, unspecified: Secondary | ICD-10-CM | POA: Diagnosis not present

## 2021-05-31 DIAGNOSIS — R4182 Altered mental status, unspecified: Secondary | ICD-10-CM | POA: Diagnosis not present

## 2021-06-02 DIAGNOSIS — Z9181 History of falling: Secondary | ICD-10-CM | POA: Diagnosis not present

## 2021-06-02 DIAGNOSIS — W19XXXA Unspecified fall, initial encounter: Secondary | ICD-10-CM | POA: Diagnosis not present

## 2021-06-02 DIAGNOSIS — M25552 Pain in left hip: Secondary | ICD-10-CM | POA: Diagnosis not present

## 2021-06-02 DIAGNOSIS — Z743 Need for continuous supervision: Secondary | ICD-10-CM | POA: Diagnosis not present

## 2021-06-02 DIAGNOSIS — M4802 Spinal stenosis, cervical region: Secondary | ICD-10-CM | POA: Diagnosis not present

## 2021-06-02 DIAGNOSIS — F0390 Unspecified dementia without behavioral disturbance: Secondary | ICD-10-CM | POA: Diagnosis not present

## 2021-06-02 DIAGNOSIS — S51811A Laceration without foreign body of right forearm, initial encounter: Secondary | ICD-10-CM | POA: Diagnosis not present

## 2021-06-02 DIAGNOSIS — R4182 Altered mental status, unspecified: Secondary | ICD-10-CM | POA: Diagnosis not present

## 2021-06-02 DIAGNOSIS — Z043 Encounter for examination and observation following other accident: Secondary | ICD-10-CM | POA: Diagnosis not present

## 2021-06-02 DIAGNOSIS — I700 Atherosclerosis of aorta: Secondary | ICD-10-CM | POA: Diagnosis not present

## 2021-06-02 DIAGNOSIS — M47812 Spondylosis without myelopathy or radiculopathy, cervical region: Secondary | ICD-10-CM | POA: Diagnosis not present

## 2021-06-02 DIAGNOSIS — W1839XA Other fall on same level, initial encounter: Secondary | ICD-10-CM | POA: Diagnosis not present

## 2021-06-02 DIAGNOSIS — Y998 Other external cause status: Secondary | ICD-10-CM | POA: Diagnosis not present

## 2021-06-02 DIAGNOSIS — Z87891 Personal history of nicotine dependence: Secondary | ICD-10-CM | POA: Diagnosis not present

## 2021-06-03 DIAGNOSIS — I272 Pulmonary hypertension, unspecified: Secondary | ICD-10-CM | POA: Diagnosis not present

## 2021-06-03 DIAGNOSIS — I110 Hypertensive heart disease with heart failure: Secondary | ICD-10-CM | POA: Diagnosis not present

## 2021-06-03 DIAGNOSIS — F0390 Unspecified dementia without behavioral disturbance: Secondary | ICD-10-CM | POA: Diagnosis not present

## 2021-06-03 DIAGNOSIS — I5032 Chronic diastolic (congestive) heart failure: Secondary | ICD-10-CM | POA: Diagnosis not present

## 2021-06-03 DIAGNOSIS — E119 Type 2 diabetes mellitus without complications: Secondary | ICD-10-CM | POA: Diagnosis not present

## 2021-06-03 DIAGNOSIS — M170 Bilateral primary osteoarthritis of knee: Secondary | ICD-10-CM | POA: Diagnosis not present

## 2021-06-07 DIAGNOSIS — I5032 Chronic diastolic (congestive) heart failure: Secondary | ICD-10-CM | POA: Diagnosis not present

## 2021-06-07 DIAGNOSIS — I110 Hypertensive heart disease with heart failure: Secondary | ICD-10-CM | POA: Diagnosis not present

## 2021-06-07 DIAGNOSIS — E119 Type 2 diabetes mellitus without complications: Secondary | ICD-10-CM | POA: Diagnosis not present

## 2021-06-07 DIAGNOSIS — R296 Repeated falls: Secondary | ICD-10-CM | POA: Diagnosis not present

## 2021-06-07 DIAGNOSIS — I517 Cardiomegaly: Secondary | ICD-10-CM | POA: Diagnosis not present

## 2021-06-07 DIAGNOSIS — M170 Bilateral primary osteoarthritis of knee: Secondary | ICD-10-CM | POA: Diagnosis not present

## 2021-06-07 DIAGNOSIS — F0390 Unspecified dementia without behavioral disturbance: Secondary | ICD-10-CM | POA: Diagnosis not present

## 2021-06-07 DIAGNOSIS — I272 Pulmonary hypertension, unspecified: Secondary | ICD-10-CM | POA: Diagnosis not present

## 2021-06-07 DIAGNOSIS — J984 Other disorders of lung: Secondary | ICD-10-CM | POA: Diagnosis not present

## 2021-06-09 DIAGNOSIS — F0390 Unspecified dementia without behavioral disturbance: Secondary | ICD-10-CM | POA: Diagnosis not present

## 2021-06-09 DIAGNOSIS — M170 Bilateral primary osteoarthritis of knee: Secondary | ICD-10-CM | POA: Diagnosis not present

## 2021-06-09 DIAGNOSIS — E119 Type 2 diabetes mellitus without complications: Secondary | ICD-10-CM | POA: Diagnosis not present

## 2021-06-09 DIAGNOSIS — I272 Pulmonary hypertension, unspecified: Secondary | ICD-10-CM | POA: Diagnosis not present

## 2021-06-09 DIAGNOSIS — I5032 Chronic diastolic (congestive) heart failure: Secondary | ICD-10-CM | POA: Diagnosis not present

## 2021-06-09 DIAGNOSIS — I110 Hypertensive heart disease with heart failure: Secondary | ICD-10-CM | POA: Diagnosis not present

## 2021-06-13 DIAGNOSIS — I5032 Chronic diastolic (congestive) heart failure: Secondary | ICD-10-CM | POA: Diagnosis not present

## 2021-06-13 DIAGNOSIS — I272 Pulmonary hypertension, unspecified: Secondary | ICD-10-CM | POA: Diagnosis not present

## 2021-06-13 DIAGNOSIS — F330 Major depressive disorder, recurrent, mild: Secondary | ICD-10-CM | POA: Diagnosis not present

## 2021-06-13 DIAGNOSIS — M170 Bilateral primary osteoarthritis of knee: Secondary | ICD-10-CM | POA: Diagnosis not present

## 2021-06-13 DIAGNOSIS — F0281 Dementia in other diseases classified elsewhere with behavioral disturbance: Secondary | ICD-10-CM | POA: Diagnosis not present

## 2021-06-13 DIAGNOSIS — G301 Alzheimer's disease with late onset: Secondary | ICD-10-CM | POA: Diagnosis not present

## 2021-06-13 DIAGNOSIS — F0390 Unspecified dementia without behavioral disturbance: Secondary | ICD-10-CM | POA: Diagnosis not present

## 2021-06-13 DIAGNOSIS — F5105 Insomnia due to other mental disorder: Secondary | ICD-10-CM | POA: Diagnosis not present

## 2021-06-13 DIAGNOSIS — I110 Hypertensive heart disease with heart failure: Secondary | ICD-10-CM | POA: Diagnosis not present

## 2021-06-13 DIAGNOSIS — E119 Type 2 diabetes mellitus without complications: Secondary | ICD-10-CM | POA: Diagnosis not present

## 2021-06-13 DIAGNOSIS — R441 Visual hallucinations: Secondary | ICD-10-CM | POA: Diagnosis not present

## 2021-06-14 DIAGNOSIS — J984 Other disorders of lung: Secondary | ICD-10-CM | POA: Diagnosis not present

## 2021-06-14 DIAGNOSIS — Z20822 Contact with and (suspected) exposure to covid-19: Secondary | ICD-10-CM | POA: Diagnosis not present

## 2021-06-14 DIAGNOSIS — R296 Repeated falls: Secondary | ICD-10-CM | POA: Diagnosis not present

## 2021-06-14 DIAGNOSIS — J9611 Chronic respiratory failure with hypoxia: Secondary | ICD-10-CM | POA: Diagnosis not present

## 2021-06-16 DIAGNOSIS — M170 Bilateral primary osteoarthritis of knee: Secondary | ICD-10-CM | POA: Diagnosis not present

## 2021-06-16 DIAGNOSIS — I5032 Chronic diastolic (congestive) heart failure: Secondary | ICD-10-CM | POA: Diagnosis not present

## 2021-06-16 DIAGNOSIS — E119 Type 2 diabetes mellitus without complications: Secondary | ICD-10-CM | POA: Diagnosis not present

## 2021-06-16 DIAGNOSIS — I110 Hypertensive heart disease with heart failure: Secondary | ICD-10-CM | POA: Diagnosis not present

## 2021-06-16 DIAGNOSIS — I272 Pulmonary hypertension, unspecified: Secondary | ICD-10-CM | POA: Diagnosis not present

## 2021-06-16 DIAGNOSIS — F0390 Unspecified dementia without behavioral disturbance: Secondary | ICD-10-CM | POA: Diagnosis not present

## 2021-06-19 DIAGNOSIS — I272 Pulmonary hypertension, unspecified: Secondary | ICD-10-CM | POA: Diagnosis not present

## 2021-06-19 DIAGNOSIS — I110 Hypertensive heart disease with heart failure: Secondary | ICD-10-CM | POA: Diagnosis not present

## 2021-06-19 DIAGNOSIS — I5032 Chronic diastolic (congestive) heart failure: Secondary | ICD-10-CM | POA: Diagnosis not present

## 2021-06-19 DIAGNOSIS — F0390 Unspecified dementia without behavioral disturbance: Secondary | ICD-10-CM | POA: Diagnosis not present

## 2021-06-19 DIAGNOSIS — E119 Type 2 diabetes mellitus without complications: Secondary | ICD-10-CM | POA: Diagnosis not present

## 2021-06-19 DIAGNOSIS — M170 Bilateral primary osteoarthritis of knee: Secondary | ICD-10-CM | POA: Diagnosis not present

## 2021-06-21 DIAGNOSIS — J9611 Chronic respiratory failure with hypoxia: Secondary | ICD-10-CM | POA: Diagnosis not present

## 2021-06-21 DIAGNOSIS — J984 Other disorders of lung: Secondary | ICD-10-CM | POA: Diagnosis not present

## 2021-06-21 DIAGNOSIS — R251 Tremor, unspecified: Secondary | ICD-10-CM | POA: Diagnosis not present

## 2021-06-23 ENCOUNTER — Ambulatory Visit (INDEPENDENT_AMBULATORY_CARE_PROVIDER_SITE_OTHER): Payer: Medicare Other | Admitting: Cardiology

## 2021-06-23 ENCOUNTER — Other Ambulatory Visit: Payer: Self-pay

## 2021-06-23 ENCOUNTER — Encounter: Payer: Self-pay | Admitting: Cardiology

## 2021-06-23 VITALS — BP 189/71 | HR 82 | Ht 69.6 in | Wt 200.1 lb

## 2021-06-23 DIAGNOSIS — I712 Thoracic aortic aneurysm, without rupture: Secondary | ICD-10-CM

## 2021-06-23 DIAGNOSIS — I7121 Aneurysm of the ascending aorta, without rupture: Secondary | ICD-10-CM

## 2021-06-23 DIAGNOSIS — I5032 Chronic diastolic (congestive) heart failure: Secondary | ICD-10-CM | POA: Diagnosis not present

## 2021-06-23 DIAGNOSIS — I272 Pulmonary hypertension, unspecified: Secondary | ICD-10-CM | POA: Diagnosis not present

## 2021-06-23 DIAGNOSIS — I10 Essential (primary) hypertension: Secondary | ICD-10-CM

## 2021-06-23 DIAGNOSIS — I110 Hypertensive heart disease with heart failure: Secondary | ICD-10-CM | POA: Diagnosis not present

## 2021-06-23 DIAGNOSIS — E782 Mixed hyperlipidemia: Secondary | ICD-10-CM

## 2021-06-23 DIAGNOSIS — E119 Type 2 diabetes mellitus without complications: Secondary | ICD-10-CM | POA: Diagnosis not present

## 2021-06-23 DIAGNOSIS — F0390 Unspecified dementia without behavioral disturbance: Secondary | ICD-10-CM | POA: Diagnosis not present

## 2021-06-23 DIAGNOSIS — M170 Bilateral primary osteoarthritis of knee: Secondary | ICD-10-CM | POA: Diagnosis not present

## 2021-06-23 NOTE — Patient Instructions (Signed)

## 2021-06-23 NOTE — Progress Notes (Signed)
Cardiology Office Note:    Date:  06/23/2021   ID:  Douglas Grant, DOB [birth year], MRN [id number]  PCP:  Douglas Pal, DO  Cardiologist:  Douglas Lindau, Grant   Referring Grant: Douglas Grant*    ASSESSMENT:    1. Primary hypertension   2. Mixed hyperlipidemia   3. Ascending aortic aneurysm (HCC)    PLAN:    In order of problems listed above:  Secondary prevention stressed with the patient.  Importance of compliance with diet medication stressed any vocalized understanding. Essential hypertension: The family mentions to me that the blood pressure stable at home.  Patient has an element of whitecoat hypertension.  I told him to keep a track of his blood pressures at home and present them to his local doctor who checks him on a regular basis and they are agreeable.  Diet, lifestyle modification urged and he understands. Mixed dyslipidemia: Diet was emphasized.  Lipids were reviewed and he promises to do better with this. Ascending aortic aneurysm: Medical management at this time.  We will continue to monitor. Patient will be seen in follow-up appointment in 6 months or earlier if the patient has any concerns    Medication Adjustments/Labs and Tests Ordered: Current medicines are reviewed at length with the patient today.  Concerns regarding medicines are outlined above.  Orders Placed This Encounter  Procedures   EKG 12-Lead   No orders of the defined types were placed in this encounter.    No chief complaint on file.    History of Present Illness:    Douglas Grant is a 87 y.o. male.  Patient has past medical history of atherosclerotic coronary artery disease, ascending aortic aneurysm and essential hypertension and dyslipidemia.  He denies any problems at this time and takes care of activities of daily living.  No chest pain orthopnea or PND.  At the time of my evaluation, the patient is alert awake oriented and in no distress.  He leads a sedentary  lifestyle.  He is brought in by his family member.  Past Medical History:  Diagnosis Date   Acute respiratory failure with hypoxia ([place]) 12/29/2019   Arthritis    Ascending aortic aneurysm ([place]) 07/18/2020   ATHEROSCLEROSIS, CORONARY, NATIVE ARTERY 08/21/2007   Qualifier: Diagnosis of  By: Douglas Grant     B12 deficiency 04/24/2011   BACK PAIN 07/28/2009   Qualifier: Diagnosis of  By: Douglas Riddle Grant, Belenda Cruise     Benign essential hypertension 12/28/2020   Benign localized prostatic hyperplasia with lower urinary tract symptoms (LUTS)    BENIGN PROSTATIC HYPERTROPHY, HX OF 11/25/2007   Qualifier: Diagnosis of  By: Douglas Grant     BPH (benign prostatic hyperplasia) 12/25/2017   Candidiasis of other urogenital sites 07/13/2008   Qualifier: Diagnosis of  By: Douglas Grant     Carpal tunnel syndrome 07/28/2007   Centricity Description: CARPAL TUNNEL SYNDROME, RIGHT Qualifier: Diagnosis of  By: Douglas Grant   Centricity Description: CARPAL TUNNEL SYNDROME, LEFT, MILD Qualifier: Diagnosis of  By: Douglas Grant, Douglas Grant    Cervicalgia 06/15/2008   Qualifier: Diagnosis of  By: Douglas Grant, Douglas Grant    CHF (congestive heart failure) ([place]) 12/29/2019   Chronic diastolic CHF (congestive heart failure) ([place]) 12/30/2019   Controlled type 2 diabetes mellitus with complication, without long-term current use of insulin ([name]) 02/10/2007   Qualifier: Diagnosis of  By: Douglas Grant     COVID-19 virus infection 12/30/2019  Dementia without behavioral disturbance ([place]) 07/29/2019   Diabetes mellitus type 2 in obese ([place]) 01/07/2020   Diabetes mellitus type 2, controlled, without complications ([place]) 12/27/4816   Diarrhea 07/26/2017   DOE (dyspnea on exertion)    Elevated d-dimer 02/10/2020   ERECTILE DYSFUNCTION, ORGANIC 08/21/2007   Qualifier: Diagnosis of  By: Douglas Grant     Essential hypertension 02/10/2007   Qualifier: Diagnosis of  By: Douglas Grant     Full dentures    Gait disturbance    GERD 08/21/2007    Qualifier: Diagnosis of  By: Douglas Grant     GERD (gastroesophageal reflux disease)    Headache(784.0) 02/18/2009   Qualifier: Diagnosis of  By: Douglas Darner Grant, Douglas Grant     Heart murmur    HIP PAIN, LEFT 11/10/2007   Qualifier: Diagnosis of  By: Douglas Grant     History of CVA in adulthood 03/2011   acute infarct right lateral thalamus posteriorly in internal capsule,  per pt no residuals   HLD (hyperlipidemia) 12/30/2019   Hyperlipidemia    Hyperlipidemia LDL goal <100 02/10/2007   Qualifier: Diagnosis of  By: Douglas Grant     Hypertension    Hypothyroidism    Intention tremor 03/20/2018   Iron deficiency anemia    Late onset Alzheimer's dementia with behavioral disturbance ([place]) 12/28/2020   Memory difficulty    OBESITY 08/21/2007   Qualifier: Diagnosis of  By: Douglas Grant     Obesity (BMI 30-39.9) 11/16/2013   Obstructive sleep apnea 12/28/2020   Onychomycosis 10/06/2015   Other urinary incontinence 11/25/2007   Qualifier: Diagnosis of  By: [name], Kendrick Fries     Pain in lower limb 10/06/2015   Palpitations 02/10/2020   Paraspinal muscle spasm 08/06/2013   Personal history of colonic polyps    Physical deconditioning 09/09/2020   Pneumonia due to COVID-19 virus 12/30/2019   Pressure injury of skin 12/30/2019   PRURITUS 11/25/2007   Qualifier: Diagnosis of  By: Etter Sjogren DOKendrick Fries     Rash and nonspecific skin eruption 09/16/2016   Seasonal allergies 05/01/2018   SPONDYLOSIS, CERVICAL, WITH RADICULOPATHY 08/11/2007   Qualifier: Diagnosis of  By: [name], SHOULDER/ARM NEC 06/26/2007   Qualifier: Diagnosis of  By: Larose Kells Grant, [place]    Stroke ([place]) 04/24/2011   Syncope 07/26/2017   Type 2 diabetes, diet controlled ([place])    VENTRICULAR HYPERTROPHY, LEFT 08/21/2007   Qualifier: Diagnosis of  By: Douglas Grant     Visual hallucinations 03/20/2018   Wart 01/18/2014   Wheezing 12/22/2013    Past Surgical History:  Procedure Laterality Date   CATARACT EXTRACTION W/  INTRAOCULAR LENS  IMPLANT, BILATERAL  2017   CIRCUMCISION N/A 10/02/2018   Procedure: CIRCUMCISION ADULT;  Surgeon: [name], Grant;  Location: [facility];  Service: Urology;  Laterality: N/A;   HERNIA REPAIR     LAPAROSCOPIC CHOLECYSTECTOMY  early 2000s    Current Medications: Current Meds  Medication Sig   albuterol (VENTOLIN HFA) 108 (90 Base) MCG/ACT inhaler Inhale 2 puffs into the lungs every 6 (six) hours as needed for wheezing or shortness of breath.   alfuzosin (UROXATRAL) 10 MG 24 hr tablet Take 10 mg by mouth daily with breakfast.   amLODipine (NORVASC) 5 MG tablet Take 1 tablet (5 mg total) by mouth daily.   benztropine (COGENTIN) 0.5 MG tablet Take 0.5 mg by mouth daily.   cetirizine (ZYRTEC) 10 MG tablet Take 1 tablet (10  mg total) by mouth daily.   Cyanocobalamin (B-12) 1000 MCG TABS Take 1,000 mcg by mouth daily.   donepezil (ARICEPT) 10 MG tablet Take 10 mg by mouth at bedtime.   Ferrous Sulfate Dried 45 MG TBCR Take 45 mg by mouth 2 (two) times daily.   finasteride (PROSCAR) 5 MG tablet Take 5 mg by mouth daily in the afternoon.   levothyroxine (SYNTHROID) 75 MCG tablet Take 75 mcg by mouth daily before breakfast.   Melatonin 3 MG CAPS Take 3 mg by mouth at bedtime.   memantine (NAMENDA) 5 MG tablet Take 5 mg by mouth 2 (two) times daily.   omeprazole (PRILOSEC) 40 MG capsule Take 40 mg by mouth daily.   pravastatin (PRAVACHOL) 40 MG tablet Take 40 mg by mouth daily.   propranolol ER (INDERAL LA) 60 MG 24 hr capsule Take 60 mg by mouth daily.   QUEtiapine (SEROQUEL) 100 MG tablet Take 100 mg by mouth at bedtime.   QUEtiapine (SEROQUEL) 50 MG tablet Take 50 mg by mouth daily at 12 noon.   venlafaxine XR (EFFEXOR-XR) 150 MG 24 hr capsule Take 150 mg by mouth daily with breakfast.     Allergies:   Atenolol, Clonidine hydrochloride, Prednisone, and Verapamil   Social History   Socioeconomic History   Marital status: Widowed    Spouse name: Not on file   Number of  children: Not on file   Years of education: Not on file   Highest education level: Not on file  Occupational History   Not on file  Tobacco Use   Smoking status: Former    Packs/day: 3.00    Years: 30.00    Pack years: 90.00    Types: Cigarettes    Quit date: 04/18/1985    Years since quitting: 36.2   Smokeless tobacco: Never  Vaping Use   Vaping Use: Never used  Substance and Sexual Activity   Alcohol use: No    Alcohol/week: 0.0 standard drinks   Drug use: Never   Sexual activity: Never  Other Topics Concern   Not on file  Social History Narrative   Not on file   Social Determinants of Health   Financial Resource Strain: Low Risk    Difficulty of Paying Living Expenses: Not hard at all  Food Insecurity: No Food Insecurity   Worried About Charity fundraiser in the Last Year: Never true   [place] in the Last Year: Never true  Transportation Needs: No Transportation Needs   Lack of Transportation (Medical): No   Lack of Transportation (Non-Medical): No  Physical Activity: Not on file  Stress: Not on file  Social Connections: Not on file     Family History: The patient's family history includes Alcohol abuse in his brother and father; Depression in his mother.  ROS:   Please see the history of present illness.    All other systems reviewed and are negative.  EKGs/Labs/Other Studies Reviewed:    The following studies were reviewed today: I discussed my findings with the patient at length.   Recent Labs: No results found for requested labs within last 8760 hours.  Recent Lipid Panel    Component Value Date/Time   CHOL 141 12/31/2019 0100   TRIG 61 12/31/2019 0100   HDL 32 (L) 12/31/2019 0100   CHOLHDL 4.4 12/31/2019 0100   VLDL 12 12/31/2019 0100   LDLCALC 97 12/31/2019 0100   LDLDIRECT 155.3 10/06/2010 0850    Physical Exam:  VS:  BP (!) 189/71   Pulse 82   Ht 5' 9.6" (1.768 m)   Wt 200 lb 1.9 oz (90.8 kg)   SpO2 92%   BMI 29.05 kg/m      Wt Readings from Last 3 Encounters:  06/23/21 200 lb 1.9 oz (90.8 kg)  10/31/20 207 lb 4 oz (94 kg)  09/09/20 208 lb (94.3 kg)     GEN: Patient is in no acute distress HEENT: Normal NECK: No JVD; No carotid bruits LYMPHATICS: No lymphadenopathy CARDIAC: Hear sounds regular, 2/6 systolic murmur at the apex. RESPIRATORY:  Clear to auscultation without rales, wheezing or rhonchi  ABDOMEN: Soft, non-tender, non-distended MUSCULOSKELETAL:  No edema; No deformity  SKIN: Warm and dry NEUROLOGIC:  Alert and oriented x 3 PSYCHIATRIC:  Normal affect   Signed, Douglas Lindau, Grant  06/23/2021 3:57 PM    [facility]

## 2021-06-24 DIAGNOSIS — M47812 Spondylosis without myelopathy or radiculopathy, cervical region: Secondary | ICD-10-CM | POA: Diagnosis not present

## 2021-06-24 DIAGNOSIS — F0390 Unspecified dementia without behavioral disturbance: Secondary | ICD-10-CM | POA: Diagnosis not present

## 2021-06-24 DIAGNOSIS — I272 Pulmonary hypertension, unspecified: Secondary | ICD-10-CM | POA: Diagnosis not present

## 2021-06-24 DIAGNOSIS — K219 Gastro-esophageal reflux disease without esophagitis: Secondary | ICD-10-CM | POA: Diagnosis not present

## 2021-06-24 DIAGNOSIS — D509 Iron deficiency anemia, unspecified: Secondary | ICD-10-CM | POA: Diagnosis not present

## 2021-06-24 DIAGNOSIS — N529 Male erectile dysfunction, unspecified: Secondary | ICD-10-CM | POA: Diagnosis not present

## 2021-06-24 DIAGNOSIS — N400 Enlarged prostate without lower urinary tract symptoms: Secondary | ICD-10-CM | POA: Diagnosis not present

## 2021-06-24 DIAGNOSIS — E039 Hypothyroidism, unspecified: Secondary | ICD-10-CM | POA: Diagnosis not present

## 2021-06-24 DIAGNOSIS — Z9181 History of falling: Secondary | ICD-10-CM | POA: Diagnosis not present

## 2021-06-24 DIAGNOSIS — M170 Bilateral primary osteoarthritis of knee: Secondary | ICD-10-CM | POA: Diagnosis not present

## 2021-06-24 DIAGNOSIS — E119 Type 2 diabetes mellitus without complications: Secondary | ICD-10-CM | POA: Diagnosis not present

## 2021-06-24 DIAGNOSIS — J984 Other disorders of lung: Secondary | ICD-10-CM | POA: Diagnosis not present

## 2021-06-24 DIAGNOSIS — I5032 Chronic diastolic (congestive) heart failure: Secondary | ICD-10-CM | POA: Diagnosis not present

## 2021-06-24 DIAGNOSIS — E669 Obesity, unspecified: Secondary | ICD-10-CM | POA: Diagnosis not present

## 2021-06-24 DIAGNOSIS — I2510 Atherosclerotic heart disease of native coronary artery without angina pectoris: Secondary | ICD-10-CM | POA: Diagnosis not present

## 2021-06-24 DIAGNOSIS — E785 Hyperlipidemia, unspecified: Secondary | ICD-10-CM | POA: Diagnosis not present

## 2021-06-24 DIAGNOSIS — G252 Other specified forms of tremor: Secondary | ICD-10-CM | POA: Diagnosis not present

## 2021-06-24 DIAGNOSIS — Z8673 Personal history of transient ischemic attack (TIA), and cerebral infarction without residual deficits: Secondary | ICD-10-CM | POA: Diagnosis not present

## 2021-06-24 DIAGNOSIS — I110 Hypertensive heart disease with heart failure: Secondary | ICD-10-CM | POA: Diagnosis not present

## 2021-06-24 DIAGNOSIS — Z6831 Body mass index (BMI) 31.0-31.9, adult: Secondary | ICD-10-CM | POA: Diagnosis not present

## 2021-06-24 DIAGNOSIS — D519 Vitamin B12 deficiency anemia, unspecified: Secondary | ICD-10-CM | POA: Diagnosis not present

## 2021-06-24 DIAGNOSIS — Z8616 Personal history of COVID-19: Secondary | ICD-10-CM | POA: Diagnosis not present

## 2021-06-24 DIAGNOSIS — Z9981 Dependence on supplemental oxygen: Secondary | ICD-10-CM | POA: Diagnosis not present

## 2021-06-27 DIAGNOSIS — R441 Visual hallucinations: Secondary | ICD-10-CM | POA: Diagnosis not present

## 2021-06-27 DIAGNOSIS — I10 Essential (primary) hypertension: Secondary | ICD-10-CM | POA: Diagnosis not present

## 2021-06-27 DIAGNOSIS — F0281 Dementia in other diseases classified elsewhere with behavioral disturbance: Secondary | ICD-10-CM | POA: Diagnosis not present

## 2021-06-27 DIAGNOSIS — W1789XA Other fall from one level to another, initial encounter: Secondary | ICD-10-CM | POA: Diagnosis not present

## 2021-06-27 DIAGNOSIS — F5105 Insomnia due to other mental disorder: Secondary | ICD-10-CM | POA: Diagnosis not present

## 2021-06-27 DIAGNOSIS — L03213 Periorbital cellulitis: Secondary | ICD-10-CM | POA: Diagnosis not present

## 2021-06-27 DIAGNOSIS — G301 Alzheimer's disease with late onset: Secondary | ICD-10-CM | POA: Diagnosis not present

## 2021-06-27 DIAGNOSIS — F330 Major depressive disorder, recurrent, mild: Secondary | ICD-10-CM | POA: Diagnosis not present

## 2021-06-29 DIAGNOSIS — B0239 Other herpes zoster eye disease: Secondary | ICD-10-CM | POA: Diagnosis not present

## 2021-06-29 DIAGNOSIS — F0390 Unspecified dementia without behavioral disturbance: Secondary | ICD-10-CM | POA: Diagnosis not present

## 2021-06-29 DIAGNOSIS — I110 Hypertensive heart disease with heart failure: Secondary | ICD-10-CM | POA: Diagnosis not present

## 2021-06-29 DIAGNOSIS — E119 Type 2 diabetes mellitus without complications: Secondary | ICD-10-CM | POA: Diagnosis not present

## 2021-06-29 DIAGNOSIS — M170 Bilateral primary osteoarthritis of knee: Secondary | ICD-10-CM | POA: Diagnosis not present

## 2021-06-29 DIAGNOSIS — I5032 Chronic diastolic (congestive) heart failure: Secondary | ICD-10-CM | POA: Diagnosis not present

## 2021-06-29 DIAGNOSIS — I272 Pulmonary hypertension, unspecified: Secondary | ICD-10-CM | POA: Diagnosis not present

## 2021-06-30 DIAGNOSIS — I5032 Chronic diastolic (congestive) heart failure: Secondary | ICD-10-CM | POA: Diagnosis not present

## 2021-06-30 DIAGNOSIS — F0390 Unspecified dementia without behavioral disturbance: Secondary | ICD-10-CM | POA: Diagnosis not present

## 2021-06-30 DIAGNOSIS — I110 Hypertensive heart disease with heart failure: Secondary | ICD-10-CM | POA: Diagnosis not present

## 2021-06-30 DIAGNOSIS — M170 Bilateral primary osteoarthritis of knee: Secondary | ICD-10-CM | POA: Diagnosis not present

## 2021-06-30 DIAGNOSIS — I272 Pulmonary hypertension, unspecified: Secondary | ICD-10-CM | POA: Diagnosis not present

## 2021-06-30 DIAGNOSIS — E119 Type 2 diabetes mellitus without complications: Secondary | ICD-10-CM | POA: Diagnosis not present

## 2021-07-04 DIAGNOSIS — E039 Hypothyroidism, unspecified: Secondary | ICD-10-CM | POA: Diagnosis not present

## 2021-07-05 DIAGNOSIS — F0390 Unspecified dementia without behavioral disturbance: Secondary | ICD-10-CM | POA: Diagnosis not present

## 2021-07-05 DIAGNOSIS — I110 Hypertensive heart disease with heart failure: Secondary | ICD-10-CM | POA: Diagnosis not present

## 2021-07-05 DIAGNOSIS — F39 Unspecified mood [affective] disorder: Secondary | ICD-10-CM | POA: Diagnosis not present

## 2021-07-05 DIAGNOSIS — M170 Bilateral primary osteoarthritis of knee: Secondary | ICD-10-CM | POA: Diagnosis not present

## 2021-07-05 DIAGNOSIS — E119 Type 2 diabetes mellitus without complications: Secondary | ICD-10-CM | POA: Diagnosis not present

## 2021-07-05 DIAGNOSIS — I5032 Chronic diastolic (congestive) heart failure: Secondary | ICD-10-CM | POA: Diagnosis not present

## 2021-07-05 DIAGNOSIS — I272 Pulmonary hypertension, unspecified: Secondary | ICD-10-CM | POA: Diagnosis not present

## 2021-07-05 DIAGNOSIS — B0230 Zoster ocular disease, unspecified: Secondary | ICD-10-CM | POA: Diagnosis not present

## 2021-07-06 DIAGNOSIS — N390 Urinary tract infection, site not specified: Secondary | ICD-10-CM | POA: Diagnosis not present

## 2021-07-06 DIAGNOSIS — R441 Visual hallucinations: Secondary | ICD-10-CM | POA: Diagnosis not present

## 2021-07-07 DIAGNOSIS — I5032 Chronic diastolic (congestive) heart failure: Secondary | ICD-10-CM | POA: Diagnosis not present

## 2021-07-07 DIAGNOSIS — I110 Hypertensive heart disease with heart failure: Secondary | ICD-10-CM | POA: Diagnosis not present

## 2021-07-07 DIAGNOSIS — E119 Type 2 diabetes mellitus without complications: Secondary | ICD-10-CM | POA: Diagnosis not present

## 2021-07-07 DIAGNOSIS — M170 Bilateral primary osteoarthritis of knee: Secondary | ICD-10-CM | POA: Diagnosis not present

## 2021-07-07 DIAGNOSIS — F0390 Unspecified dementia without behavioral disturbance: Secondary | ICD-10-CM | POA: Diagnosis not present

## 2021-07-07 DIAGNOSIS — I272 Pulmonary hypertension, unspecified: Secondary | ICD-10-CM | POA: Diagnosis not present

## 2021-07-12 DIAGNOSIS — N189 Chronic kidney disease, unspecified: Secondary | ICD-10-CM | POA: Diagnosis not present

## 2021-07-12 DIAGNOSIS — B0229 Other postherpetic nervous system involvement: Secondary | ICD-10-CM | POA: Diagnosis not present

## 2021-07-12 DIAGNOSIS — B0230 Zoster ocular disease, unspecified: Secondary | ICD-10-CM | POA: Diagnosis not present

## 2021-07-12 DIAGNOSIS — R739 Hyperglycemia, unspecified: Secondary | ICD-10-CM | POA: Diagnosis not present

## 2021-07-12 DIAGNOSIS — F39 Unspecified mood [affective] disorder: Secondary | ICD-10-CM | POA: Diagnosis not present

## 2021-07-13 DIAGNOSIS — I110 Hypertensive heart disease with heart failure: Secondary | ICD-10-CM | POA: Diagnosis not present

## 2021-07-13 DIAGNOSIS — F0390 Unspecified dementia without behavioral disturbance: Secondary | ICD-10-CM | POA: Diagnosis not present

## 2021-07-13 DIAGNOSIS — E119 Type 2 diabetes mellitus without complications: Secondary | ICD-10-CM | POA: Diagnosis not present

## 2021-07-13 DIAGNOSIS — I272 Pulmonary hypertension, unspecified: Secondary | ICD-10-CM | POA: Diagnosis not present

## 2021-07-13 DIAGNOSIS — R7309 Other abnormal glucose: Secondary | ICD-10-CM | POA: Diagnosis not present

## 2021-07-13 DIAGNOSIS — I5032 Chronic diastolic (congestive) heart failure: Secondary | ICD-10-CM | POA: Diagnosis not present

## 2021-07-13 DIAGNOSIS — M170 Bilateral primary osteoarthritis of knee: Secondary | ICD-10-CM | POA: Diagnosis not present

## 2021-07-19 DIAGNOSIS — B0229 Other postherpetic nervous system involvement: Secondary | ICD-10-CM | POA: Diagnosis not present

## 2021-07-19 DIAGNOSIS — R739 Hyperglycemia, unspecified: Secondary | ICD-10-CM | POA: Diagnosis not present

## 2021-07-19 DIAGNOSIS — B0230 Zoster ocular disease, unspecified: Secondary | ICD-10-CM | POA: Diagnosis not present

## 2021-07-20 DIAGNOSIS — I5032 Chronic diastolic (congestive) heart failure: Secondary | ICD-10-CM | POA: Diagnosis not present

## 2021-07-20 DIAGNOSIS — I110 Hypertensive heart disease with heart failure: Secondary | ICD-10-CM | POA: Diagnosis not present

## 2021-07-20 DIAGNOSIS — E119 Type 2 diabetes mellitus without complications: Secondary | ICD-10-CM | POA: Diagnosis not present

## 2021-07-20 DIAGNOSIS — I272 Pulmonary hypertension, unspecified: Secondary | ICD-10-CM | POA: Diagnosis not present

## 2021-07-20 DIAGNOSIS — M170 Bilateral primary osteoarthritis of knee: Secondary | ICD-10-CM | POA: Diagnosis not present

## 2021-07-20 DIAGNOSIS — F0390 Unspecified dementia without behavioral disturbance: Secondary | ICD-10-CM | POA: Diagnosis not present

## 2021-07-24 DIAGNOSIS — N529 Male erectile dysfunction, unspecified: Secondary | ICD-10-CM | POA: Diagnosis not present

## 2021-07-24 DIAGNOSIS — D519 Vitamin B12 deficiency anemia, unspecified: Secondary | ICD-10-CM | POA: Diagnosis not present

## 2021-07-24 DIAGNOSIS — N400 Enlarged prostate without lower urinary tract symptoms: Secondary | ICD-10-CM | POA: Diagnosis not present

## 2021-07-24 DIAGNOSIS — Z6831 Body mass index (BMI) 31.0-31.9, adult: Secondary | ICD-10-CM | POA: Diagnosis not present

## 2021-07-24 DIAGNOSIS — D509 Iron deficiency anemia, unspecified: Secondary | ICD-10-CM | POA: Diagnosis not present

## 2021-07-24 DIAGNOSIS — M47812 Spondylosis without myelopathy or radiculopathy, cervical region: Secondary | ICD-10-CM | POA: Diagnosis not present

## 2021-07-24 DIAGNOSIS — E039 Hypothyroidism, unspecified: Secondary | ICD-10-CM | POA: Diagnosis not present

## 2021-07-24 DIAGNOSIS — Z9181 History of falling: Secondary | ICD-10-CM | POA: Diagnosis not present

## 2021-07-24 DIAGNOSIS — E785 Hyperlipidemia, unspecified: Secondary | ICD-10-CM | POA: Diagnosis not present

## 2021-07-24 DIAGNOSIS — K219 Gastro-esophageal reflux disease without esophagitis: Secondary | ICD-10-CM | POA: Diagnosis not present

## 2021-07-24 DIAGNOSIS — F0390 Unspecified dementia without behavioral disturbance: Secondary | ICD-10-CM | POA: Diagnosis not present

## 2021-07-24 DIAGNOSIS — Z8673 Personal history of transient ischemic attack (TIA), and cerebral infarction without residual deficits: Secondary | ICD-10-CM | POA: Diagnosis not present

## 2021-07-24 DIAGNOSIS — M170 Bilateral primary osteoarthritis of knee: Secondary | ICD-10-CM | POA: Diagnosis not present

## 2021-07-24 DIAGNOSIS — Z8616 Personal history of COVID-19: Secondary | ICD-10-CM | POA: Diagnosis not present

## 2021-07-24 DIAGNOSIS — I2510 Atherosclerotic heart disease of native coronary artery without angina pectoris: Secondary | ICD-10-CM | POA: Diagnosis not present

## 2021-07-24 DIAGNOSIS — I5032 Chronic diastolic (congestive) heart failure: Secondary | ICD-10-CM | POA: Diagnosis not present

## 2021-07-24 DIAGNOSIS — I110 Hypertensive heart disease with heart failure: Secondary | ICD-10-CM | POA: Diagnosis not present

## 2021-07-24 DIAGNOSIS — E119 Type 2 diabetes mellitus without complications: Secondary | ICD-10-CM | POA: Diagnosis not present

## 2021-07-24 DIAGNOSIS — Z9981 Dependence on supplemental oxygen: Secondary | ICD-10-CM | POA: Diagnosis not present

## 2021-07-24 DIAGNOSIS — E669 Obesity, unspecified: Secondary | ICD-10-CM | POA: Diagnosis not present

## 2021-07-24 DIAGNOSIS — J984 Other disorders of lung: Secondary | ICD-10-CM | POA: Diagnosis not present

## 2021-07-24 DIAGNOSIS — G252 Other specified forms of tremor: Secondary | ICD-10-CM | POA: Diagnosis not present

## 2021-07-24 DIAGNOSIS — I272 Pulmonary hypertension, unspecified: Secondary | ICD-10-CM | POA: Diagnosis not present

## 2021-07-25 DIAGNOSIS — F0281 Dementia in other diseases classified elsewhere with behavioral disturbance: Secondary | ICD-10-CM | POA: Diagnosis not present

## 2021-07-25 DIAGNOSIS — F5105 Insomnia due to other mental disorder: Secondary | ICD-10-CM | POA: Diagnosis not present

## 2021-07-25 DIAGNOSIS — R441 Visual hallucinations: Secondary | ICD-10-CM | POA: Diagnosis not present

## 2021-07-25 DIAGNOSIS — F330 Major depressive disorder, recurrent, mild: Secondary | ICD-10-CM | POA: Diagnosis not present

## 2021-07-25 DIAGNOSIS — G301 Alzheimer's disease with late onset: Secondary | ICD-10-CM | POA: Diagnosis not present

## 2021-07-26 DIAGNOSIS — B0229 Other postherpetic nervous system involvement: Secondary | ICD-10-CM | POA: Diagnosis not present

## 2021-07-26 DIAGNOSIS — B0230 Zoster ocular disease, unspecified: Secondary | ICD-10-CM | POA: Diagnosis not present

## 2021-07-27 DIAGNOSIS — F0390 Unspecified dementia without behavioral disturbance: Secondary | ICD-10-CM | POA: Diagnosis not present

## 2021-07-27 DIAGNOSIS — M170 Bilateral primary osteoarthritis of knee: Secondary | ICD-10-CM | POA: Diagnosis not present

## 2021-07-27 DIAGNOSIS — E119 Type 2 diabetes mellitus without complications: Secondary | ICD-10-CM | POA: Diagnosis not present

## 2021-07-27 DIAGNOSIS — I5032 Chronic diastolic (congestive) heart failure: Secondary | ICD-10-CM | POA: Diagnosis not present

## 2021-07-27 DIAGNOSIS — I110 Hypertensive heart disease with heart failure: Secondary | ICD-10-CM | POA: Diagnosis not present

## 2021-07-27 DIAGNOSIS — I272 Pulmonary hypertension, unspecified: Secondary | ICD-10-CM | POA: Diagnosis not present

## 2021-07-31 DIAGNOSIS — F0390 Unspecified dementia without behavioral disturbance: Secondary | ICD-10-CM | POA: Diagnosis not present

## 2021-07-31 DIAGNOSIS — I272 Pulmonary hypertension, unspecified: Secondary | ICD-10-CM | POA: Diagnosis not present

## 2021-07-31 DIAGNOSIS — I110 Hypertensive heart disease with heart failure: Secondary | ICD-10-CM | POA: Diagnosis not present

## 2021-07-31 DIAGNOSIS — M170 Bilateral primary osteoarthritis of knee: Secondary | ICD-10-CM | POA: Diagnosis not present

## 2021-07-31 DIAGNOSIS — E119 Type 2 diabetes mellitus without complications: Secondary | ICD-10-CM | POA: Diagnosis not present

## 2021-07-31 DIAGNOSIS — I5032 Chronic diastolic (congestive) heart failure: Secondary | ICD-10-CM | POA: Diagnosis not present

## 2021-08-02 DIAGNOSIS — E118 Type 2 diabetes mellitus with unspecified complications: Secondary | ICD-10-CM | POA: Diagnosis not present

## 2021-08-02 DIAGNOSIS — I10 Essential (primary) hypertension: Secondary | ICD-10-CM | POA: Diagnosis not present

## 2021-08-02 DIAGNOSIS — E039 Hypothyroidism, unspecified: Secondary | ICD-10-CM | POA: Diagnosis not present

## 2021-08-02 DIAGNOSIS — F0390 Unspecified dementia without behavioral disturbance: Secondary | ICD-10-CM | POA: Diagnosis not present

## 2021-08-03 DIAGNOSIS — I5032 Chronic diastolic (congestive) heart failure: Secondary | ICD-10-CM | POA: Diagnosis not present

## 2021-08-03 DIAGNOSIS — M170 Bilateral primary osteoarthritis of knee: Secondary | ICD-10-CM | POA: Diagnosis not present

## 2021-08-03 DIAGNOSIS — E119 Type 2 diabetes mellitus without complications: Secondary | ICD-10-CM | POA: Diagnosis not present

## 2021-08-03 DIAGNOSIS — I110 Hypertensive heart disease with heart failure: Secondary | ICD-10-CM | POA: Diagnosis not present

## 2021-08-03 DIAGNOSIS — I272 Pulmonary hypertension, unspecified: Secondary | ICD-10-CM | POA: Diagnosis not present

## 2021-08-03 DIAGNOSIS — F0390 Unspecified dementia without behavioral disturbance: Secondary | ICD-10-CM | POA: Diagnosis not present

## 2021-08-04 DIAGNOSIS — I110 Hypertensive heart disease with heart failure: Secondary | ICD-10-CM | POA: Diagnosis not present

## 2021-08-04 DIAGNOSIS — I272 Pulmonary hypertension, unspecified: Secondary | ICD-10-CM | POA: Diagnosis not present

## 2021-08-04 DIAGNOSIS — I5032 Chronic diastolic (congestive) heart failure: Secondary | ICD-10-CM | POA: Diagnosis not present

## 2021-08-04 DIAGNOSIS — E119 Type 2 diabetes mellitus without complications: Secondary | ICD-10-CM | POA: Diagnosis not present

## 2021-08-04 DIAGNOSIS — F0390 Unspecified dementia without behavioral disturbance: Secondary | ICD-10-CM | POA: Diagnosis not present

## 2021-08-04 DIAGNOSIS — M170 Bilateral primary osteoarthritis of knee: Secondary | ICD-10-CM | POA: Diagnosis not present

## 2021-08-07 ENCOUNTER — Other Ambulatory Visit: Payer: Self-pay | Admitting: Family Medicine

## 2021-08-07 DIAGNOSIS — I5032 Chronic diastolic (congestive) heart failure: Secondary | ICD-10-CM | POA: Diagnosis not present

## 2021-08-07 DIAGNOSIS — F0390 Unspecified dementia without behavioral disturbance: Secondary | ICD-10-CM | POA: Diagnosis not present

## 2021-08-07 DIAGNOSIS — I272 Pulmonary hypertension, unspecified: Secondary | ICD-10-CM | POA: Diagnosis not present

## 2021-08-07 DIAGNOSIS — I110 Hypertensive heart disease with heart failure: Secondary | ICD-10-CM | POA: Diagnosis not present

## 2021-08-07 DIAGNOSIS — E119 Type 2 diabetes mellitus without complications: Secondary | ICD-10-CM | POA: Diagnosis not present

## 2021-08-07 DIAGNOSIS — M170 Bilateral primary osteoarthritis of knee: Secondary | ICD-10-CM | POA: Diagnosis not present

## 2021-08-09 DIAGNOSIS — F0390 Unspecified dementia without behavioral disturbance: Secondary | ICD-10-CM | POA: Diagnosis not present

## 2021-08-09 DIAGNOSIS — S40812A Abrasion of left upper arm, initial encounter: Secondary | ICD-10-CM | POA: Diagnosis not present

## 2021-08-09 DIAGNOSIS — I272 Pulmonary hypertension, unspecified: Secondary | ICD-10-CM | POA: Diagnosis not present

## 2021-08-09 DIAGNOSIS — M170 Bilateral primary osteoarthritis of knee: Secondary | ICD-10-CM | POA: Diagnosis not present

## 2021-08-09 DIAGNOSIS — B0230 Zoster ocular disease, unspecified: Secondary | ICD-10-CM | POA: Diagnosis not present

## 2021-08-09 DIAGNOSIS — I110 Hypertensive heart disease with heart failure: Secondary | ICD-10-CM | POA: Diagnosis not present

## 2021-08-09 DIAGNOSIS — E119 Type 2 diabetes mellitus without complications: Secondary | ICD-10-CM | POA: Diagnosis not present

## 2021-08-09 DIAGNOSIS — I5032 Chronic diastolic (congestive) heart failure: Secondary | ICD-10-CM | POA: Diagnosis not present

## 2021-08-09 DIAGNOSIS — L89510 Pressure ulcer of right ankle, unstageable: Secondary | ICD-10-CM | POA: Diagnosis not present

## 2021-08-10 DIAGNOSIS — I110 Hypertensive heart disease with heart failure: Secondary | ICD-10-CM | POA: Diagnosis not present

## 2021-08-10 DIAGNOSIS — I272 Pulmonary hypertension, unspecified: Secondary | ICD-10-CM | POA: Diagnosis not present

## 2021-08-10 DIAGNOSIS — F0390 Unspecified dementia without behavioral disturbance: Secondary | ICD-10-CM | POA: Diagnosis not present

## 2021-08-10 DIAGNOSIS — I5032 Chronic diastolic (congestive) heart failure: Secondary | ICD-10-CM | POA: Diagnosis not present

## 2021-08-10 DIAGNOSIS — E119 Type 2 diabetes mellitus without complications: Secondary | ICD-10-CM | POA: Diagnosis not present

## 2021-08-10 DIAGNOSIS — M170 Bilateral primary osteoarthritis of knee: Secondary | ICD-10-CM | POA: Diagnosis not present

## 2021-08-15 DIAGNOSIS — M170 Bilateral primary osteoarthritis of knee: Secondary | ICD-10-CM | POA: Diagnosis not present

## 2021-08-15 DIAGNOSIS — F0390 Unspecified dementia without behavioral disturbance: Secondary | ICD-10-CM | POA: Diagnosis not present

## 2021-08-15 DIAGNOSIS — I272 Pulmonary hypertension, unspecified: Secondary | ICD-10-CM | POA: Diagnosis not present

## 2021-08-15 DIAGNOSIS — E119 Type 2 diabetes mellitus without complications: Secondary | ICD-10-CM | POA: Diagnosis not present

## 2021-08-15 DIAGNOSIS — I5032 Chronic diastolic (congestive) heart failure: Secondary | ICD-10-CM | POA: Diagnosis not present

## 2021-08-15 DIAGNOSIS — I110 Hypertensive heart disease with heart failure: Secondary | ICD-10-CM | POA: Diagnosis not present

## 2021-08-16 DIAGNOSIS — I272 Pulmonary hypertension, unspecified: Secondary | ICD-10-CM | POA: Diagnosis not present

## 2021-08-16 DIAGNOSIS — F0390 Unspecified dementia without behavioral disturbance: Secondary | ICD-10-CM | POA: Diagnosis not present

## 2021-08-16 DIAGNOSIS — I5032 Chronic diastolic (congestive) heart failure: Secondary | ICD-10-CM | POA: Diagnosis not present

## 2021-08-16 DIAGNOSIS — E119 Type 2 diabetes mellitus without complications: Secondary | ICD-10-CM | POA: Diagnosis not present

## 2021-08-16 DIAGNOSIS — I110 Hypertensive heart disease with heart failure: Secondary | ICD-10-CM | POA: Diagnosis not present

## 2021-08-16 DIAGNOSIS — M170 Bilateral primary osteoarthritis of knee: Secondary | ICD-10-CM | POA: Diagnosis not present

## 2021-08-18 ENCOUNTER — Other Ambulatory Visit: Payer: Self-pay | Admitting: Family Medicine

## 2021-08-22 DIAGNOSIS — G301 Alzheimer's disease with late onset: Secondary | ICD-10-CM | POA: Diagnosis not present

## 2021-08-22 DIAGNOSIS — F0281 Dementia in other diseases classified elsewhere with behavioral disturbance: Secondary | ICD-10-CM | POA: Diagnosis not present

## 2021-08-22 DIAGNOSIS — F330 Major depressive disorder, recurrent, mild: Secondary | ICD-10-CM | POA: Diagnosis not present

## 2021-08-22 DIAGNOSIS — R441 Visual hallucinations: Secondary | ICD-10-CM | POA: Diagnosis not present

## 2021-08-22 DIAGNOSIS — F5105 Insomnia due to other mental disorder: Secondary | ICD-10-CM | POA: Diagnosis not present

## 2021-08-23 DIAGNOSIS — I272 Pulmonary hypertension, unspecified: Secondary | ICD-10-CM | POA: Diagnosis not present

## 2021-08-23 DIAGNOSIS — I2510 Atherosclerotic heart disease of native coronary artery without angina pectoris: Secondary | ICD-10-CM | POA: Diagnosis not present

## 2021-08-23 DIAGNOSIS — J984 Other disorders of lung: Secondary | ICD-10-CM | POA: Diagnosis not present

## 2021-08-23 DIAGNOSIS — Z9981 Dependence on supplemental oxygen: Secondary | ICD-10-CM | POA: Diagnosis not present

## 2021-08-23 DIAGNOSIS — Z6831 Body mass index (BMI) 31.0-31.9, adult: Secondary | ICD-10-CM | POA: Diagnosis not present

## 2021-08-23 DIAGNOSIS — K219 Gastro-esophageal reflux disease without esophagitis: Secondary | ICD-10-CM | POA: Diagnosis not present

## 2021-08-23 DIAGNOSIS — D509 Iron deficiency anemia, unspecified: Secondary | ICD-10-CM | POA: Diagnosis not present

## 2021-08-23 DIAGNOSIS — D519 Vitamin B12 deficiency anemia, unspecified: Secondary | ICD-10-CM | POA: Diagnosis not present

## 2021-08-23 DIAGNOSIS — F0390 Unspecified dementia without behavioral disturbance: Secondary | ICD-10-CM | POA: Diagnosis not present

## 2021-08-23 DIAGNOSIS — N400 Enlarged prostate without lower urinary tract symptoms: Secondary | ICD-10-CM | POA: Diagnosis not present

## 2021-08-23 DIAGNOSIS — M170 Bilateral primary osteoarthritis of knee: Secondary | ICD-10-CM | POA: Diagnosis not present

## 2021-08-23 DIAGNOSIS — I110 Hypertensive heart disease with heart failure: Secondary | ICD-10-CM | POA: Diagnosis not present

## 2021-08-23 DIAGNOSIS — E785 Hyperlipidemia, unspecified: Secondary | ICD-10-CM | POA: Diagnosis not present

## 2021-08-23 DIAGNOSIS — E669 Obesity, unspecified: Secondary | ICD-10-CM | POA: Diagnosis not present

## 2021-08-23 DIAGNOSIS — G252 Other specified forms of tremor: Secondary | ICD-10-CM | POA: Diagnosis not present

## 2021-08-23 DIAGNOSIS — Z9181 History of falling: Secondary | ICD-10-CM | POA: Diagnosis not present

## 2021-08-23 DIAGNOSIS — E039 Hypothyroidism, unspecified: Secondary | ICD-10-CM | POA: Diagnosis not present

## 2021-08-23 DIAGNOSIS — Z8673 Personal history of transient ischemic attack (TIA), and cerebral infarction without residual deficits: Secondary | ICD-10-CM | POA: Diagnosis not present

## 2021-08-23 DIAGNOSIS — M47812 Spondylosis without myelopathy or radiculopathy, cervical region: Secondary | ICD-10-CM | POA: Diagnosis not present

## 2021-08-23 DIAGNOSIS — Z8616 Personal history of COVID-19: Secondary | ICD-10-CM | POA: Diagnosis not present

## 2021-08-23 DIAGNOSIS — E119 Type 2 diabetes mellitus without complications: Secondary | ICD-10-CM | POA: Diagnosis not present

## 2021-08-23 DIAGNOSIS — N529 Male erectile dysfunction, unspecified: Secondary | ICD-10-CM | POA: Diagnosis not present

## 2021-08-23 DIAGNOSIS — I5032 Chronic diastolic (congestive) heart failure: Secondary | ICD-10-CM | POA: Diagnosis not present

## 2021-08-25 DIAGNOSIS — F0390 Unspecified dementia without behavioral disturbance: Secondary | ICD-10-CM | POA: Diagnosis not present

## 2021-08-25 DIAGNOSIS — M170 Bilateral primary osteoarthritis of knee: Secondary | ICD-10-CM | POA: Diagnosis not present

## 2021-08-25 DIAGNOSIS — I5032 Chronic diastolic (congestive) heart failure: Secondary | ICD-10-CM | POA: Diagnosis not present

## 2021-08-25 DIAGNOSIS — E119 Type 2 diabetes mellitus without complications: Secondary | ICD-10-CM | POA: Diagnosis not present

## 2021-08-25 DIAGNOSIS — I110 Hypertensive heart disease with heart failure: Secondary | ICD-10-CM | POA: Diagnosis not present

## 2021-08-25 DIAGNOSIS — I272 Pulmonary hypertension, unspecified: Secondary | ICD-10-CM | POA: Diagnosis not present

## 2021-08-26 DIAGNOSIS — M170 Bilateral primary osteoarthritis of knee: Secondary | ICD-10-CM | POA: Diagnosis not present

## 2021-08-26 DIAGNOSIS — F0390 Unspecified dementia without behavioral disturbance: Secondary | ICD-10-CM | POA: Diagnosis not present

## 2021-08-26 DIAGNOSIS — E119 Type 2 diabetes mellitus without complications: Secondary | ICD-10-CM | POA: Diagnosis not present

## 2021-08-26 DIAGNOSIS — I5032 Chronic diastolic (congestive) heart failure: Secondary | ICD-10-CM | POA: Diagnosis not present

## 2021-08-26 DIAGNOSIS — I110 Hypertensive heart disease with heart failure: Secondary | ICD-10-CM | POA: Diagnosis not present

## 2021-08-26 DIAGNOSIS — I272 Pulmonary hypertension, unspecified: Secondary | ICD-10-CM | POA: Diagnosis not present

## 2021-08-29 DIAGNOSIS — I5032 Chronic diastolic (congestive) heart failure: Secondary | ICD-10-CM | POA: Diagnosis not present

## 2021-08-29 DIAGNOSIS — E119 Type 2 diabetes mellitus without complications: Secondary | ICD-10-CM | POA: Diagnosis not present

## 2021-08-29 DIAGNOSIS — F0390 Unspecified dementia without behavioral disturbance: Secondary | ICD-10-CM | POA: Diagnosis not present

## 2021-08-29 DIAGNOSIS — I110 Hypertensive heart disease with heart failure: Secondary | ICD-10-CM | POA: Diagnosis not present

## 2021-08-29 DIAGNOSIS — M170 Bilateral primary osteoarthritis of knee: Secondary | ICD-10-CM | POA: Diagnosis not present

## 2021-08-29 DIAGNOSIS — I272 Pulmonary hypertension, unspecified: Secondary | ICD-10-CM | POA: Diagnosis not present

## 2021-09-05 DIAGNOSIS — M170 Bilateral primary osteoarthritis of knee: Secondary | ICD-10-CM | POA: Diagnosis not present

## 2021-09-05 DIAGNOSIS — F0390 Unspecified dementia without behavioral disturbance: Secondary | ICD-10-CM | POA: Diagnosis not present

## 2021-09-05 DIAGNOSIS — I272 Pulmonary hypertension, unspecified: Secondary | ICD-10-CM | POA: Diagnosis not present

## 2021-09-05 DIAGNOSIS — I5032 Chronic diastolic (congestive) heart failure: Secondary | ICD-10-CM | POA: Diagnosis not present

## 2021-09-05 DIAGNOSIS — I110 Hypertensive heart disease with heart failure: Secondary | ICD-10-CM | POA: Diagnosis not present

## 2021-09-05 DIAGNOSIS — E119 Type 2 diabetes mellitus without complications: Secondary | ICD-10-CM | POA: Diagnosis not present

## 2021-09-18 DIAGNOSIS — I272 Pulmonary hypertension, unspecified: Secondary | ICD-10-CM | POA: Diagnosis not present

## 2021-09-18 DIAGNOSIS — I110 Hypertensive heart disease with heart failure: Secondary | ICD-10-CM | POA: Diagnosis not present

## 2021-09-18 DIAGNOSIS — I5032 Chronic diastolic (congestive) heart failure: Secondary | ICD-10-CM | POA: Diagnosis not present

## 2021-09-18 DIAGNOSIS — M79675 Pain in left toe(s): Secondary | ICD-10-CM | POA: Diagnosis not present

## 2021-09-18 DIAGNOSIS — B351 Tinea unguium: Secondary | ICD-10-CM | POA: Diagnosis not present

## 2021-09-18 DIAGNOSIS — F0390 Unspecified dementia without behavioral disturbance: Secondary | ICD-10-CM | POA: Diagnosis not present

## 2021-09-18 DIAGNOSIS — E119 Type 2 diabetes mellitus without complications: Secondary | ICD-10-CM | POA: Diagnosis not present

## 2021-09-18 DIAGNOSIS — M79674 Pain in right toe(s): Secondary | ICD-10-CM | POA: Diagnosis not present

## 2021-09-18 DIAGNOSIS — M170 Bilateral primary osteoarthritis of knee: Secondary | ICD-10-CM | POA: Diagnosis not present

## 2021-09-19 DIAGNOSIS — R441 Visual hallucinations: Secondary | ICD-10-CM | POA: Diagnosis not present

## 2021-09-19 DIAGNOSIS — G301 Alzheimer's disease with late onset: Secondary | ICD-10-CM | POA: Diagnosis not present

## 2021-09-19 DIAGNOSIS — F5105 Insomnia due to other mental disorder: Secondary | ICD-10-CM | POA: Diagnosis not present

## 2021-09-19 DIAGNOSIS — F0281 Dementia in other diseases classified elsewhere with behavioral disturbance: Secondary | ICD-10-CM | POA: Diagnosis not present

## 2021-09-19 DIAGNOSIS — F330 Major depressive disorder, recurrent, mild: Secondary | ICD-10-CM | POA: Diagnosis not present

## 2021-09-21 DIAGNOSIS — F0390 Unspecified dementia without behavioral disturbance: Secondary | ICD-10-CM | POA: Diagnosis not present

## 2021-09-21 DIAGNOSIS — R5381 Other malaise: Secondary | ICD-10-CM | POA: Diagnosis not present

## 2021-09-21 DIAGNOSIS — R059 Cough, unspecified: Secondary | ICD-10-CM | POA: Diagnosis not present

## 2021-09-25 DIAGNOSIS — N390 Urinary tract infection, site not specified: Secondary | ICD-10-CM | POA: Diagnosis not present

## 2021-09-26 DIAGNOSIS — I10 Essential (primary) hypertension: Secondary | ICD-10-CM | POA: Diagnosis not present

## 2021-09-26 DIAGNOSIS — R7309 Other abnormal glucose: Secondary | ICD-10-CM | POA: Diagnosis not present

## 2021-09-26 DIAGNOSIS — E039 Hypothyroidism, unspecified: Secondary | ICD-10-CM | POA: Diagnosis not present

## 2021-09-26 DIAGNOSIS — E785 Hyperlipidemia, unspecified: Secondary | ICD-10-CM | POA: Diagnosis not present

## 2021-09-27 DIAGNOSIS — F39 Unspecified mood [affective] disorder: Secondary | ICD-10-CM | POA: Diagnosis not present

## 2021-09-27 DIAGNOSIS — E118 Type 2 diabetes mellitus with unspecified complications: Secondary | ICD-10-CM | POA: Diagnosis not present

## 2021-09-27 DIAGNOSIS — B0229 Other postherpetic nervous system involvement: Secondary | ICD-10-CM | POA: Diagnosis not present

## 2021-09-27 DIAGNOSIS — D509 Iron deficiency anemia, unspecified: Secondary | ICD-10-CM | POA: Diagnosis not present

## 2021-09-27 DIAGNOSIS — K219 Gastro-esophageal reflux disease without esophagitis: Secondary | ICD-10-CM | POA: Diagnosis not present

## 2021-09-27 DIAGNOSIS — E876 Hypokalemia: Secondary | ICD-10-CM | POA: Diagnosis not present

## 2021-09-27 DIAGNOSIS — F0390 Unspecified dementia without behavioral disturbance: Secondary | ICD-10-CM | POA: Diagnosis not present

## 2021-09-27 DIAGNOSIS — M159 Polyosteoarthritis, unspecified: Secondary | ICD-10-CM | POA: Diagnosis not present

## 2021-09-27 DIAGNOSIS — N400 Enlarged prostate without lower urinary tract symptoms: Secondary | ICD-10-CM | POA: Diagnosis not present

## 2021-09-27 DIAGNOSIS — I10 Essential (primary) hypertension: Secondary | ICD-10-CM | POA: Diagnosis not present

## 2021-09-27 DIAGNOSIS — E46 Unspecified protein-calorie malnutrition: Secondary | ICD-10-CM | POA: Diagnosis not present

## 2021-09-27 DIAGNOSIS — R4182 Altered mental status, unspecified: Secondary | ICD-10-CM | POA: Diagnosis not present

## 2021-10-04 DIAGNOSIS — L89510 Pressure ulcer of right ankle, unstageable: Secondary | ICD-10-CM | POA: Diagnosis not present

## 2021-10-11 DIAGNOSIS — L89512 Pressure ulcer of right ankle, stage 2: Secondary | ICD-10-CM | POA: Diagnosis not present

## 2021-10-13 DIAGNOSIS — M47812 Spondylosis without myelopathy or radiculopathy, cervical region: Secondary | ICD-10-CM | POA: Diagnosis not present

## 2021-10-13 DIAGNOSIS — Z9181 History of falling: Secondary | ICD-10-CM | POA: Diagnosis not present

## 2021-10-13 DIAGNOSIS — F0392 Unspecified dementia, unspecified severity, with psychotic disturbance: Secondary | ICD-10-CM | POA: Diagnosis not present

## 2021-10-13 DIAGNOSIS — L89512 Pressure ulcer of right ankle, stage 2: Secondary | ICD-10-CM | POA: Diagnosis not present

## 2021-10-13 DIAGNOSIS — E785 Hyperlipidemia, unspecified: Secondary | ICD-10-CM | POA: Diagnosis not present

## 2021-10-13 DIAGNOSIS — E119 Type 2 diabetes mellitus without complications: Secondary | ICD-10-CM | POA: Diagnosis not present

## 2021-10-13 DIAGNOSIS — Z8616 Personal history of COVID-19: Secondary | ICD-10-CM | POA: Diagnosis not present

## 2021-10-13 DIAGNOSIS — N529 Male erectile dysfunction, unspecified: Secondary | ICD-10-CM | POA: Diagnosis not present

## 2021-10-13 DIAGNOSIS — R443 Hallucinations, unspecified: Secondary | ICD-10-CM | POA: Diagnosis not present

## 2021-10-13 DIAGNOSIS — I5032 Chronic diastolic (congestive) heart failure: Secondary | ICD-10-CM | POA: Diagnosis not present

## 2021-10-13 DIAGNOSIS — E039 Hypothyroidism, unspecified: Secondary | ICD-10-CM | POA: Diagnosis not present

## 2021-10-13 DIAGNOSIS — I272 Pulmonary hypertension, unspecified: Secondary | ICD-10-CM | POA: Diagnosis not present

## 2021-10-13 DIAGNOSIS — B0230 Zoster ocular disease, unspecified: Secondary | ICD-10-CM | POA: Diagnosis not present

## 2021-10-13 DIAGNOSIS — D519 Vitamin B12 deficiency anemia, unspecified: Secondary | ICD-10-CM | POA: Diagnosis not present

## 2021-10-13 DIAGNOSIS — I2510 Atherosclerotic heart disease of native coronary artery without angina pectoris: Secondary | ICD-10-CM | POA: Diagnosis not present

## 2021-10-13 DIAGNOSIS — K219 Gastro-esophageal reflux disease without esophagitis: Secondary | ICD-10-CM | POA: Diagnosis not present

## 2021-10-13 DIAGNOSIS — N400 Enlarged prostate without lower urinary tract symptoms: Secondary | ICD-10-CM | POA: Diagnosis not present

## 2021-10-13 DIAGNOSIS — M170 Bilateral primary osteoarthritis of knee: Secondary | ICD-10-CM | POA: Diagnosis not present

## 2021-10-13 DIAGNOSIS — I110 Hypertensive heart disease with heart failure: Secondary | ICD-10-CM | POA: Diagnosis not present

## 2021-10-13 DIAGNOSIS — D509 Iron deficiency anemia, unspecified: Secondary | ICD-10-CM | POA: Diagnosis not present

## 2021-10-13 DIAGNOSIS — E669 Obesity, unspecified: Secondary | ICD-10-CM | POA: Diagnosis not present

## 2021-10-13 DIAGNOSIS — Z6827 Body mass index (BMI) 27.0-27.9, adult: Secondary | ICD-10-CM | POA: Diagnosis not present

## 2021-10-16 DIAGNOSIS — R443 Hallucinations, unspecified: Secondary | ICD-10-CM | POA: Diagnosis not present

## 2021-10-16 DIAGNOSIS — I5032 Chronic diastolic (congestive) heart failure: Secondary | ICD-10-CM | POA: Diagnosis not present

## 2021-10-16 DIAGNOSIS — I272 Pulmonary hypertension, unspecified: Secondary | ICD-10-CM | POA: Diagnosis not present

## 2021-10-16 DIAGNOSIS — F0392 Unspecified dementia, unspecified severity, with psychotic disturbance: Secondary | ICD-10-CM | POA: Diagnosis not present

## 2021-10-16 DIAGNOSIS — I110 Hypertensive heart disease with heart failure: Secondary | ICD-10-CM | POA: Diagnosis not present

## 2021-10-16 DIAGNOSIS — L89512 Pressure ulcer of right ankle, stage 2: Secondary | ICD-10-CM | POA: Diagnosis not present

## 2021-10-17 DIAGNOSIS — R441 Visual hallucinations: Secondary | ICD-10-CM | POA: Diagnosis not present

## 2021-10-17 DIAGNOSIS — F330 Major depressive disorder, recurrent, mild: Secondary | ICD-10-CM | POA: Diagnosis not present

## 2021-10-17 DIAGNOSIS — F5105 Insomnia due to other mental disorder: Secondary | ICD-10-CM | POA: Diagnosis not present

## 2021-10-17 DIAGNOSIS — G301 Alzheimer's disease with late onset: Secondary | ICD-10-CM | POA: Diagnosis not present

## 2021-10-18 DIAGNOSIS — L89512 Pressure ulcer of right ankle, stage 2: Secondary | ICD-10-CM | POA: Diagnosis not present

## 2021-10-18 DIAGNOSIS — I5032 Chronic diastolic (congestive) heart failure: Secondary | ICD-10-CM | POA: Diagnosis not present

## 2021-10-18 DIAGNOSIS — I272 Pulmonary hypertension, unspecified: Secondary | ICD-10-CM | POA: Diagnosis not present

## 2021-10-18 DIAGNOSIS — F0392 Unspecified dementia, unspecified severity, with psychotic disturbance: Secondary | ICD-10-CM | POA: Diagnosis not present

## 2021-10-18 DIAGNOSIS — R443 Hallucinations, unspecified: Secondary | ICD-10-CM | POA: Diagnosis not present

## 2021-10-18 DIAGNOSIS — I110 Hypertensive heart disease with heart failure: Secondary | ICD-10-CM | POA: Diagnosis not present

## 2021-10-23 DIAGNOSIS — I110 Hypertensive heart disease with heart failure: Secondary | ICD-10-CM | POA: Diagnosis not present

## 2021-10-23 DIAGNOSIS — R443 Hallucinations, unspecified: Secondary | ICD-10-CM | POA: Diagnosis not present

## 2021-10-23 DIAGNOSIS — L89512 Pressure ulcer of right ankle, stage 2: Secondary | ICD-10-CM | POA: Diagnosis not present

## 2021-10-23 DIAGNOSIS — F0392 Unspecified dementia, unspecified severity, with psychotic disturbance: Secondary | ICD-10-CM | POA: Diagnosis not present

## 2021-10-23 DIAGNOSIS — I272 Pulmonary hypertension, unspecified: Secondary | ICD-10-CM | POA: Diagnosis not present

## 2021-10-23 DIAGNOSIS — I5032 Chronic diastolic (congestive) heart failure: Secondary | ICD-10-CM | POA: Diagnosis not present

## 2021-10-25 DIAGNOSIS — F0392 Unspecified dementia, unspecified severity, with psychotic disturbance: Secondary | ICD-10-CM | POA: Diagnosis not present

## 2021-10-25 DIAGNOSIS — I272 Pulmonary hypertension, unspecified: Secondary | ICD-10-CM | POA: Diagnosis not present

## 2021-10-25 DIAGNOSIS — I5032 Chronic diastolic (congestive) heart failure: Secondary | ICD-10-CM | POA: Diagnosis not present

## 2021-10-25 DIAGNOSIS — I110 Hypertensive heart disease with heart failure: Secondary | ICD-10-CM | POA: Diagnosis not present

## 2021-10-25 DIAGNOSIS — L89512 Pressure ulcer of right ankle, stage 2: Secondary | ICD-10-CM | POA: Diagnosis not present

## 2021-10-25 DIAGNOSIS — R443 Hallucinations, unspecified: Secondary | ICD-10-CM | POA: Diagnosis not present

## 2021-10-30 DIAGNOSIS — I5032 Chronic diastolic (congestive) heart failure: Secondary | ICD-10-CM | POA: Diagnosis not present

## 2021-10-30 DIAGNOSIS — I272 Pulmonary hypertension, unspecified: Secondary | ICD-10-CM | POA: Diagnosis not present

## 2021-10-30 DIAGNOSIS — L89512 Pressure ulcer of right ankle, stage 2: Secondary | ICD-10-CM | POA: Diagnosis not present

## 2021-10-30 DIAGNOSIS — R443 Hallucinations, unspecified: Secondary | ICD-10-CM | POA: Diagnosis not present

## 2021-10-30 DIAGNOSIS — F0392 Unspecified dementia, unspecified severity, with psychotic disturbance: Secondary | ICD-10-CM | POA: Diagnosis not present

## 2021-10-30 DIAGNOSIS — I110 Hypertensive heart disease with heart failure: Secondary | ICD-10-CM | POA: Diagnosis not present

## 2021-11-02 DIAGNOSIS — R443 Hallucinations, unspecified: Secondary | ICD-10-CM | POA: Diagnosis not present

## 2021-11-02 DIAGNOSIS — F0392 Unspecified dementia, unspecified severity, with psychotic disturbance: Secondary | ICD-10-CM | POA: Diagnosis not present

## 2021-11-02 DIAGNOSIS — I272 Pulmonary hypertension, unspecified: Secondary | ICD-10-CM | POA: Diagnosis not present

## 2021-11-02 DIAGNOSIS — L89512 Pressure ulcer of right ankle, stage 2: Secondary | ICD-10-CM | POA: Diagnosis not present

## 2021-11-02 DIAGNOSIS — I5032 Chronic diastolic (congestive) heart failure: Secondary | ICD-10-CM | POA: Diagnosis not present

## 2021-11-02 DIAGNOSIS — I110 Hypertensive heart disease with heart failure: Secondary | ICD-10-CM | POA: Diagnosis not present

## 2021-11-03 ENCOUNTER — Other Ambulatory Visit: Payer: Self-pay | Admitting: Family Medicine

## 2021-11-06 DIAGNOSIS — F0392 Unspecified dementia, unspecified severity, with psychotic disturbance: Secondary | ICD-10-CM | POA: Diagnosis not present

## 2021-11-06 DIAGNOSIS — I5032 Chronic diastolic (congestive) heart failure: Secondary | ICD-10-CM | POA: Diagnosis not present

## 2021-11-06 DIAGNOSIS — I110 Hypertensive heart disease with heart failure: Secondary | ICD-10-CM | POA: Diagnosis not present

## 2021-11-06 DIAGNOSIS — R443 Hallucinations, unspecified: Secondary | ICD-10-CM | POA: Diagnosis not present

## 2021-11-06 DIAGNOSIS — I272 Pulmonary hypertension, unspecified: Secondary | ICD-10-CM | POA: Diagnosis not present

## 2021-11-06 DIAGNOSIS — L89512 Pressure ulcer of right ankle, stage 2: Secondary | ICD-10-CM | POA: Diagnosis not present

## 2021-11-09 DIAGNOSIS — R443 Hallucinations, unspecified: Secondary | ICD-10-CM | POA: Diagnosis not present

## 2021-11-09 DIAGNOSIS — F0392 Unspecified dementia, unspecified severity, with psychotic disturbance: Secondary | ICD-10-CM | POA: Diagnosis not present

## 2021-11-09 DIAGNOSIS — I110 Hypertensive heart disease with heart failure: Secondary | ICD-10-CM | POA: Diagnosis not present

## 2021-11-09 DIAGNOSIS — L89512 Pressure ulcer of right ankle, stage 2: Secondary | ICD-10-CM | POA: Diagnosis not present

## 2021-11-09 DIAGNOSIS — I5032 Chronic diastolic (congestive) heart failure: Secondary | ICD-10-CM | POA: Diagnosis not present

## 2021-11-09 DIAGNOSIS — I272 Pulmonary hypertension, unspecified: Secondary | ICD-10-CM | POA: Diagnosis not present

## 2021-11-12 DIAGNOSIS — M47812 Spondylosis without myelopathy or radiculopathy, cervical region: Secondary | ICD-10-CM | POA: Diagnosis not present

## 2021-11-12 DIAGNOSIS — E785 Hyperlipidemia, unspecified: Secondary | ICD-10-CM | POA: Diagnosis not present

## 2021-11-12 DIAGNOSIS — I2510 Atherosclerotic heart disease of native coronary artery without angina pectoris: Secondary | ICD-10-CM | POA: Diagnosis not present

## 2021-11-12 DIAGNOSIS — E669 Obesity, unspecified: Secondary | ICD-10-CM | POA: Diagnosis not present

## 2021-11-12 DIAGNOSIS — D519 Vitamin B12 deficiency anemia, unspecified: Secondary | ICD-10-CM | POA: Diagnosis not present

## 2021-11-12 DIAGNOSIS — M170 Bilateral primary osteoarthritis of knee: Secondary | ICD-10-CM | POA: Diagnosis not present

## 2021-11-12 DIAGNOSIS — Z9181 History of falling: Secondary | ICD-10-CM | POA: Diagnosis not present

## 2021-11-12 DIAGNOSIS — K219 Gastro-esophageal reflux disease without esophagitis: Secondary | ICD-10-CM | POA: Diagnosis not present

## 2021-11-12 DIAGNOSIS — D509 Iron deficiency anemia, unspecified: Secondary | ICD-10-CM | POA: Diagnosis not present

## 2021-11-12 DIAGNOSIS — B0230 Zoster ocular disease, unspecified: Secondary | ICD-10-CM | POA: Diagnosis not present

## 2021-11-12 DIAGNOSIS — R443 Hallucinations, unspecified: Secondary | ICD-10-CM | POA: Diagnosis not present

## 2021-11-12 DIAGNOSIS — I5032 Chronic diastolic (congestive) heart failure: Secondary | ICD-10-CM | POA: Diagnosis not present

## 2021-11-12 DIAGNOSIS — I110 Hypertensive heart disease with heart failure: Secondary | ICD-10-CM | POA: Diagnosis not present

## 2021-11-12 DIAGNOSIS — L89512 Pressure ulcer of right ankle, stage 2: Secondary | ICD-10-CM | POA: Diagnosis not present

## 2021-11-12 DIAGNOSIS — N529 Male erectile dysfunction, unspecified: Secondary | ICD-10-CM | POA: Diagnosis not present

## 2021-11-12 DIAGNOSIS — N400 Enlarged prostate without lower urinary tract symptoms: Secondary | ICD-10-CM | POA: Diagnosis not present

## 2021-11-12 DIAGNOSIS — F0392 Unspecified dementia, unspecified severity, with psychotic disturbance: Secondary | ICD-10-CM | POA: Diagnosis not present

## 2021-11-12 DIAGNOSIS — I272 Pulmonary hypertension, unspecified: Secondary | ICD-10-CM | POA: Diagnosis not present

## 2021-11-12 DIAGNOSIS — Z8616 Personal history of COVID-19: Secondary | ICD-10-CM | POA: Diagnosis not present

## 2021-11-12 DIAGNOSIS — E039 Hypothyroidism, unspecified: Secondary | ICD-10-CM | POA: Diagnosis not present

## 2021-11-12 DIAGNOSIS — Z6827 Body mass index (BMI) 27.0-27.9, adult: Secondary | ICD-10-CM | POA: Diagnosis not present

## 2021-11-12 DIAGNOSIS — E119 Type 2 diabetes mellitus without complications: Secondary | ICD-10-CM | POA: Diagnosis not present

## 2021-11-13 DIAGNOSIS — I110 Hypertensive heart disease with heart failure: Secondary | ICD-10-CM | POA: Diagnosis not present

## 2021-11-13 DIAGNOSIS — I5032 Chronic diastolic (congestive) heart failure: Secondary | ICD-10-CM | POA: Diagnosis not present

## 2021-11-13 DIAGNOSIS — R443 Hallucinations, unspecified: Secondary | ICD-10-CM | POA: Diagnosis not present

## 2021-11-13 DIAGNOSIS — F0392 Unspecified dementia, unspecified severity, with psychotic disturbance: Secondary | ICD-10-CM | POA: Diagnosis not present

## 2021-11-13 DIAGNOSIS — I272 Pulmonary hypertension, unspecified: Secondary | ICD-10-CM | POA: Diagnosis not present

## 2021-11-13 DIAGNOSIS — L89512 Pressure ulcer of right ankle, stage 2: Secondary | ICD-10-CM | POA: Diagnosis not present

## 2021-11-14 DIAGNOSIS — R441 Visual hallucinations: Secondary | ICD-10-CM | POA: Diagnosis not present

## 2021-11-14 DIAGNOSIS — F5105 Insomnia due to other mental disorder: Secondary | ICD-10-CM | POA: Diagnosis not present

## 2021-11-14 DIAGNOSIS — F330 Major depressive disorder, recurrent, mild: Secondary | ICD-10-CM | POA: Diagnosis not present

## 2021-11-14 DIAGNOSIS — G301 Alzheimer's disease with late onset: Secondary | ICD-10-CM | POA: Diagnosis not present

## 2021-11-15 DIAGNOSIS — I5032 Chronic diastolic (congestive) heart failure: Secondary | ICD-10-CM | POA: Diagnosis not present

## 2021-11-15 DIAGNOSIS — F0392 Unspecified dementia, unspecified severity, with psychotic disturbance: Secondary | ICD-10-CM | POA: Diagnosis not present

## 2021-11-15 DIAGNOSIS — L89512 Pressure ulcer of right ankle, stage 2: Secondary | ICD-10-CM | POA: Diagnosis not present

## 2021-11-15 DIAGNOSIS — I272 Pulmonary hypertension, unspecified: Secondary | ICD-10-CM | POA: Diagnosis not present

## 2021-11-15 DIAGNOSIS — R443 Hallucinations, unspecified: Secondary | ICD-10-CM | POA: Diagnosis not present

## 2021-11-15 DIAGNOSIS — I110 Hypertensive heart disease with heart failure: Secondary | ICD-10-CM | POA: Diagnosis not present

## 2021-11-20 DIAGNOSIS — F0392 Unspecified dementia, unspecified severity, with psychotic disturbance: Secondary | ICD-10-CM | POA: Diagnosis not present

## 2021-11-20 DIAGNOSIS — I5032 Chronic diastolic (congestive) heart failure: Secondary | ICD-10-CM | POA: Diagnosis not present

## 2021-11-20 DIAGNOSIS — I110 Hypertensive heart disease with heart failure: Secondary | ICD-10-CM | POA: Diagnosis not present

## 2021-11-20 DIAGNOSIS — R443 Hallucinations, unspecified: Secondary | ICD-10-CM | POA: Diagnosis not present

## 2021-11-20 DIAGNOSIS — L89512 Pressure ulcer of right ankle, stage 2: Secondary | ICD-10-CM | POA: Diagnosis not present

## 2021-11-20 DIAGNOSIS — I272 Pulmonary hypertension, unspecified: Secondary | ICD-10-CM | POA: Diagnosis not present

## 2021-11-23 DIAGNOSIS — F0392 Unspecified dementia, unspecified severity, with psychotic disturbance: Secondary | ICD-10-CM | POA: Diagnosis not present

## 2021-11-23 DIAGNOSIS — L89512 Pressure ulcer of right ankle, stage 2: Secondary | ICD-10-CM | POA: Diagnosis not present

## 2021-11-23 DIAGNOSIS — I110 Hypertensive heart disease with heart failure: Secondary | ICD-10-CM | POA: Diagnosis not present

## 2021-11-23 DIAGNOSIS — I5032 Chronic diastolic (congestive) heart failure: Secondary | ICD-10-CM | POA: Diagnosis not present

## 2021-11-23 DIAGNOSIS — I272 Pulmonary hypertension, unspecified: Secondary | ICD-10-CM | POA: Diagnosis not present

## 2021-11-23 DIAGNOSIS — R443 Hallucinations, unspecified: Secondary | ICD-10-CM | POA: Diagnosis not present

## 2021-11-27 DIAGNOSIS — F0392 Unspecified dementia, unspecified severity, with psychotic disturbance: Secondary | ICD-10-CM | POA: Diagnosis not present

## 2021-11-27 DIAGNOSIS — I5032 Chronic diastolic (congestive) heart failure: Secondary | ICD-10-CM | POA: Diagnosis not present

## 2021-11-27 DIAGNOSIS — R443 Hallucinations, unspecified: Secondary | ICD-10-CM | POA: Diagnosis not present

## 2021-11-27 DIAGNOSIS — I110 Hypertensive heart disease with heart failure: Secondary | ICD-10-CM | POA: Diagnosis not present

## 2021-11-27 DIAGNOSIS — I272 Pulmonary hypertension, unspecified: Secondary | ICD-10-CM | POA: Diagnosis not present

## 2021-11-27 DIAGNOSIS — L89512 Pressure ulcer of right ankle, stage 2: Secondary | ICD-10-CM | POA: Diagnosis not present

## 2021-11-30 DIAGNOSIS — F0392 Unspecified dementia, unspecified severity, with psychotic disturbance: Secondary | ICD-10-CM | POA: Diagnosis not present

## 2021-11-30 DIAGNOSIS — L89512 Pressure ulcer of right ankle, stage 2: Secondary | ICD-10-CM | POA: Diagnosis not present

## 2021-11-30 DIAGNOSIS — I272 Pulmonary hypertension, unspecified: Secondary | ICD-10-CM | POA: Diagnosis not present

## 2021-11-30 DIAGNOSIS — I110 Hypertensive heart disease with heart failure: Secondary | ICD-10-CM | POA: Diagnosis not present

## 2021-11-30 DIAGNOSIS — I5032 Chronic diastolic (congestive) heart failure: Secondary | ICD-10-CM | POA: Diagnosis not present

## 2021-11-30 DIAGNOSIS — R443 Hallucinations, unspecified: Secondary | ICD-10-CM | POA: Diagnosis not present

## 2021-12-04 DIAGNOSIS — I5032 Chronic diastolic (congestive) heart failure: Secondary | ICD-10-CM | POA: Diagnosis not present

## 2021-12-04 DIAGNOSIS — R443 Hallucinations, unspecified: Secondary | ICD-10-CM | POA: Diagnosis not present

## 2021-12-04 DIAGNOSIS — I110 Hypertensive heart disease with heart failure: Secondary | ICD-10-CM | POA: Diagnosis not present

## 2021-12-04 DIAGNOSIS — L89512 Pressure ulcer of right ankle, stage 2: Secondary | ICD-10-CM | POA: Diagnosis not present

## 2021-12-04 DIAGNOSIS — M79674 Pain in right toe(s): Secondary | ICD-10-CM | POA: Diagnosis not present

## 2021-12-04 DIAGNOSIS — M79675 Pain in left toe(s): Secondary | ICD-10-CM | POA: Diagnosis not present

## 2021-12-04 DIAGNOSIS — F0392 Unspecified dementia, unspecified severity, with psychotic disturbance: Secondary | ICD-10-CM | POA: Diagnosis not present

## 2021-12-04 DIAGNOSIS — I272 Pulmonary hypertension, unspecified: Secondary | ICD-10-CM | POA: Diagnosis not present

## 2021-12-04 DIAGNOSIS — B351 Tinea unguium: Secondary | ICD-10-CM | POA: Diagnosis not present

## 2021-12-06 DIAGNOSIS — I272 Pulmonary hypertension, unspecified: Secondary | ICD-10-CM | POA: Diagnosis not present

## 2021-12-06 DIAGNOSIS — L89512 Pressure ulcer of right ankle, stage 2: Secondary | ICD-10-CM | POA: Diagnosis not present

## 2021-12-06 DIAGNOSIS — F0392 Unspecified dementia, unspecified severity, with psychotic disturbance: Secondary | ICD-10-CM | POA: Diagnosis not present

## 2021-12-06 DIAGNOSIS — I5032 Chronic diastolic (congestive) heart failure: Secondary | ICD-10-CM | POA: Diagnosis not present

## 2021-12-06 DIAGNOSIS — I110 Hypertensive heart disease with heart failure: Secondary | ICD-10-CM | POA: Diagnosis not present

## 2021-12-06 DIAGNOSIS — R443 Hallucinations, unspecified: Secondary | ICD-10-CM | POA: Diagnosis not present

## 2021-12-11 DIAGNOSIS — F0392 Unspecified dementia, unspecified severity, with psychotic disturbance: Secondary | ICD-10-CM | POA: Diagnosis not present

## 2021-12-11 DIAGNOSIS — I5032 Chronic diastolic (congestive) heart failure: Secondary | ICD-10-CM | POA: Diagnosis not present

## 2021-12-11 DIAGNOSIS — L89512 Pressure ulcer of right ankle, stage 2: Secondary | ICD-10-CM | POA: Diagnosis not present

## 2021-12-11 DIAGNOSIS — R443 Hallucinations, unspecified: Secondary | ICD-10-CM | POA: Diagnosis not present

## 2021-12-11 DIAGNOSIS — I110 Hypertensive heart disease with heart failure: Secondary | ICD-10-CM | POA: Diagnosis not present

## 2021-12-11 DIAGNOSIS — I272 Pulmonary hypertension, unspecified: Secondary | ICD-10-CM | POA: Diagnosis not present

## 2021-12-12 DIAGNOSIS — R443 Hallucinations, unspecified: Secondary | ICD-10-CM | POA: Diagnosis not present

## 2021-12-12 DIAGNOSIS — E785 Hyperlipidemia, unspecified: Secondary | ICD-10-CM | POA: Diagnosis not present

## 2021-12-12 DIAGNOSIS — E119 Type 2 diabetes mellitus without complications: Secondary | ICD-10-CM | POA: Diagnosis not present

## 2021-12-12 DIAGNOSIS — I110 Hypertensive heart disease with heart failure: Secondary | ICD-10-CM | POA: Diagnosis not present

## 2021-12-12 DIAGNOSIS — M170 Bilateral primary osteoarthritis of knee: Secondary | ICD-10-CM | POA: Diagnosis not present

## 2021-12-12 DIAGNOSIS — Z8616 Personal history of COVID-19: Secondary | ICD-10-CM | POA: Diagnosis not present

## 2021-12-12 DIAGNOSIS — N529 Male erectile dysfunction, unspecified: Secondary | ICD-10-CM | POA: Diagnosis not present

## 2021-12-12 DIAGNOSIS — I272 Pulmonary hypertension, unspecified: Secondary | ICD-10-CM | POA: Diagnosis not present

## 2021-12-12 DIAGNOSIS — I5032 Chronic diastolic (congestive) heart failure: Secondary | ICD-10-CM | POA: Diagnosis not present

## 2021-12-12 DIAGNOSIS — I2510 Atherosclerotic heart disease of native coronary artery without angina pectoris: Secondary | ICD-10-CM | POA: Diagnosis not present

## 2021-12-12 DIAGNOSIS — K219 Gastro-esophageal reflux disease without esophagitis: Secondary | ICD-10-CM | POA: Diagnosis not present

## 2021-12-12 DIAGNOSIS — R441 Visual hallucinations: Secondary | ICD-10-CM | POA: Diagnosis not present

## 2021-12-12 DIAGNOSIS — F5105 Insomnia due to other mental disorder: Secondary | ICD-10-CM | POA: Diagnosis not present

## 2021-12-12 DIAGNOSIS — B0230 Zoster ocular disease, unspecified: Secondary | ICD-10-CM | POA: Diagnosis not present

## 2021-12-12 DIAGNOSIS — G301 Alzheimer's disease with late onset: Secondary | ICD-10-CM | POA: Diagnosis not present

## 2021-12-12 DIAGNOSIS — M47812 Spondylosis without myelopathy or radiculopathy, cervical region: Secondary | ICD-10-CM | POA: Diagnosis not present

## 2021-12-12 DIAGNOSIS — D509 Iron deficiency anemia, unspecified: Secondary | ICD-10-CM | POA: Diagnosis not present

## 2021-12-12 DIAGNOSIS — D519 Vitamin B12 deficiency anemia, unspecified: Secondary | ICD-10-CM | POA: Diagnosis not present

## 2021-12-12 DIAGNOSIS — E039 Hypothyroidism, unspecified: Secondary | ICD-10-CM | POA: Diagnosis not present

## 2021-12-12 DIAGNOSIS — Z6827 Body mass index (BMI) 27.0-27.9, adult: Secondary | ICD-10-CM | POA: Diagnosis not present

## 2021-12-12 DIAGNOSIS — F330 Major depressive disorder, recurrent, mild: Secondary | ICD-10-CM | POA: Diagnosis not present

## 2021-12-12 DIAGNOSIS — Z9181 History of falling: Secondary | ICD-10-CM | POA: Diagnosis not present

## 2021-12-12 DIAGNOSIS — N400 Enlarged prostate without lower urinary tract symptoms: Secondary | ICD-10-CM | POA: Diagnosis not present

## 2021-12-12 DIAGNOSIS — E669 Obesity, unspecified: Secondary | ICD-10-CM | POA: Diagnosis not present

## 2021-12-20 DIAGNOSIS — I5032 Chronic diastolic (congestive) heart failure: Secondary | ICD-10-CM | POA: Diagnosis not present

## 2021-12-20 DIAGNOSIS — R443 Hallucinations, unspecified: Secondary | ICD-10-CM | POA: Diagnosis not present

## 2021-12-20 DIAGNOSIS — I110 Hypertensive heart disease with heart failure: Secondary | ICD-10-CM | POA: Diagnosis not present

## 2021-12-20 DIAGNOSIS — I272 Pulmonary hypertension, unspecified: Secondary | ICD-10-CM | POA: Diagnosis not present

## 2021-12-20 DIAGNOSIS — I2510 Atherosclerotic heart disease of native coronary artery without angina pectoris: Secondary | ICD-10-CM | POA: Diagnosis not present

## 2021-12-20 DIAGNOSIS — E119 Type 2 diabetes mellitus without complications: Secondary | ICD-10-CM | POA: Diagnosis not present

## 2021-12-23 DIAGNOSIS — R0989 Other specified symptoms and signs involving the circulatory and respiratory systems: Secondary | ICD-10-CM | POA: Diagnosis not present

## 2021-12-25 DIAGNOSIS — Z781 Physical restraint status: Secondary | ICD-10-CM | POA: Diagnosis not present

## 2021-12-25 DIAGNOSIS — T502X5A Adverse effect of carbonic-anhydrase inhibitors, benzothiadiazides and other diuretics, initial encounter: Secondary | ICD-10-CM | POA: Diagnosis not present

## 2021-12-25 DIAGNOSIS — I509 Heart failure, unspecified: Secondary | ICD-10-CM | POA: Diagnosis not present

## 2021-12-25 DIAGNOSIS — G301 Alzheimer's disease with late onset: Secondary | ICD-10-CM | POA: Diagnosis not present

## 2021-12-25 DIAGNOSIS — M2550 Pain in unspecified joint: Secondary | ICD-10-CM | POA: Diagnosis present

## 2021-12-25 DIAGNOSIS — Z743 Need for continuous supervision: Secondary | ICD-10-CM | POA: Diagnosis not present

## 2021-12-25 DIAGNOSIS — J9601 Acute respiratory failure with hypoxia: Secondary | ICD-10-CM | POA: Diagnosis not present

## 2021-12-25 DIAGNOSIS — I5021 Acute systolic (congestive) heart failure: Secondary | ICD-10-CM | POA: Diagnosis present

## 2021-12-25 DIAGNOSIS — D509 Iron deficiency anemia, unspecified: Secondary | ICD-10-CM | POA: Diagnosis present

## 2021-12-25 DIAGNOSIS — Z87891 Personal history of nicotine dependence: Secondary | ICD-10-CM | POA: Diagnosis not present

## 2021-12-25 DIAGNOSIS — I491 Atrial premature depolarization: Secondary | ICD-10-CM | POA: Diagnosis not present

## 2021-12-25 DIAGNOSIS — F39 Unspecified mood [affective] disorder: Secondary | ICD-10-CM | POA: Diagnosis not present

## 2021-12-25 DIAGNOSIS — Z20822 Contact with and (suspected) exposure to covid-19: Secondary | ICD-10-CM | POA: Diagnosis not present

## 2021-12-25 DIAGNOSIS — R0602 Shortness of breath: Secondary | ICD-10-CM | POA: Diagnosis not present

## 2021-12-25 DIAGNOSIS — E873 Alkalosis: Secondary | ICD-10-CM | POA: Diagnosis not present

## 2021-12-25 DIAGNOSIS — I5032 Chronic diastolic (congestive) heart failure: Secondary | ICD-10-CM | POA: Diagnosis not present

## 2021-12-25 DIAGNOSIS — I110 Hypertensive heart disease with heart failure: Secondary | ICD-10-CM | POA: Diagnosis not present

## 2021-12-25 DIAGNOSIS — I959 Hypotension, unspecified: Secondary | ICD-10-CM | POA: Diagnosis not present

## 2021-12-25 DIAGNOSIS — I272 Pulmonary hypertension, unspecified: Secondary | ICD-10-CM | POA: Diagnosis not present

## 2021-12-25 DIAGNOSIS — R4702 Dysphasia: Secondary | ICD-10-CM | POA: Diagnosis not present

## 2021-12-25 DIAGNOSIS — R0989 Other specified symptoms and signs involving the circulatory and respiratory systems: Secondary | ICD-10-CM | POA: Diagnosis not present

## 2021-12-25 DIAGNOSIS — F02B18 Dementia in other diseases classified elsewhere, moderate, with other behavioral disturbance: Secondary | ICD-10-CM | POA: Diagnosis present

## 2021-12-25 DIAGNOSIS — J441 Chronic obstructive pulmonary disease with (acute) exacerbation: Secondary | ICD-10-CM | POA: Diagnosis not present

## 2021-12-25 DIAGNOSIS — E785 Hyperlipidemia, unspecified: Secondary | ICD-10-CM | POA: Diagnosis not present

## 2021-12-25 DIAGNOSIS — J9621 Acute and chronic respiratory failure with hypoxia: Secondary | ICD-10-CM | POA: Diagnosis not present

## 2021-12-25 DIAGNOSIS — R279 Unspecified lack of coordination: Secondary | ICD-10-CM | POA: Diagnosis not present

## 2021-12-25 DIAGNOSIS — I10 Essential (primary) hypertension: Secondary | ICD-10-CM | POA: Diagnosis not present

## 2021-12-25 DIAGNOSIS — G309 Alzheimer's disease, unspecified: Secondary | ICD-10-CM | POA: Diagnosis present

## 2021-12-25 DIAGNOSIS — G4733 Obstructive sleep apnea (adult) (pediatric): Secondary | ICD-10-CM | POA: Diagnosis present

## 2021-12-25 DIAGNOSIS — N400 Enlarged prostate without lower urinary tract symptoms: Secondary | ICD-10-CM | POA: Diagnosis not present

## 2021-12-25 DIAGNOSIS — R443 Hallucinations, unspecified: Secondary | ICD-10-CM | POA: Diagnosis not present

## 2021-12-25 DIAGNOSIS — I5023 Acute on chronic systolic (congestive) heart failure: Secondary | ICD-10-CM | POA: Diagnosis not present

## 2021-12-25 DIAGNOSIS — F02818 Dementia in other diseases classified elsewhere, unspecified severity, with other behavioral disturbance: Secondary | ICD-10-CM | POA: Diagnosis not present

## 2021-12-25 DIAGNOSIS — I2510 Atherosclerotic heart disease of native coronary artery without angina pectoris: Secondary | ICD-10-CM | POA: Diagnosis not present

## 2021-12-25 DIAGNOSIS — J984 Other disorders of lung: Secondary | ICD-10-CM | POA: Diagnosis present

## 2021-12-25 DIAGNOSIS — E119 Type 2 diabetes mellitus without complications: Secondary | ICD-10-CM | POA: Diagnosis present

## 2021-12-25 DIAGNOSIS — E039 Hypothyroidism, unspecified: Secondary | ICD-10-CM | POA: Diagnosis not present

## 2021-12-25 DIAGNOSIS — J9611 Chronic respiratory failure with hypoxia: Secondary | ICD-10-CM | POA: Diagnosis present

## 2021-12-25 DIAGNOSIS — F0390 Unspecified dementia without behavioral disturbance: Secondary | ICD-10-CM | POA: Diagnosis not present

## 2021-12-26 DIAGNOSIS — F39 Unspecified mood [affective] disorder: Secondary | ICD-10-CM | POA: Diagnosis not present

## 2021-12-26 DIAGNOSIS — E039 Hypothyroidism, unspecified: Secondary | ICD-10-CM | POA: Diagnosis not present

## 2021-12-26 DIAGNOSIS — F02818 Dementia in other diseases classified elsewhere, unspecified severity, with other behavioral disturbance: Secondary | ICD-10-CM | POA: Diagnosis not present

## 2021-12-26 DIAGNOSIS — G301 Alzheimer's disease with late onset: Secondary | ICD-10-CM | POA: Diagnosis not present

## 2021-12-26 DIAGNOSIS — J9601 Acute respiratory failure with hypoxia: Secondary | ICD-10-CM | POA: Diagnosis not present

## 2021-12-26 DIAGNOSIS — I5021 Acute systolic (congestive) heart failure: Secondary | ICD-10-CM | POA: Diagnosis not present

## 2021-12-26 DIAGNOSIS — I509 Heart failure, unspecified: Secondary | ICD-10-CM | POA: Diagnosis not present

## 2021-12-26 DIAGNOSIS — N400 Enlarged prostate without lower urinary tract symptoms: Secondary | ICD-10-CM | POA: Diagnosis not present

## 2021-12-26 DIAGNOSIS — Z87891 Personal history of nicotine dependence: Secondary | ICD-10-CM | POA: Diagnosis not present

## 2021-12-26 DIAGNOSIS — F0390 Unspecified dementia without behavioral disturbance: Secondary | ICD-10-CM | POA: Diagnosis not present

## 2021-12-26 DIAGNOSIS — I110 Hypertensive heart disease with heart failure: Secondary | ICD-10-CM | POA: Diagnosis not present

## 2021-12-26 DIAGNOSIS — J441 Chronic obstructive pulmonary disease with (acute) exacerbation: Secondary | ICD-10-CM | POA: Diagnosis not present

## 2021-12-27 DIAGNOSIS — G301 Alzheimer's disease with late onset: Secondary | ICD-10-CM | POA: Diagnosis not present

## 2021-12-27 DIAGNOSIS — N400 Enlarged prostate without lower urinary tract symptoms: Secondary | ICD-10-CM | POA: Diagnosis not present

## 2021-12-27 DIAGNOSIS — I5021 Acute systolic (congestive) heart failure: Secondary | ICD-10-CM | POA: Diagnosis not present

## 2021-12-27 DIAGNOSIS — I110 Hypertensive heart disease with heart failure: Secondary | ICD-10-CM | POA: Diagnosis not present

## 2021-12-27 DIAGNOSIS — F02818 Dementia in other diseases classified elsewhere, unspecified severity, with other behavioral disturbance: Secondary | ICD-10-CM | POA: Diagnosis not present

## 2021-12-27 DIAGNOSIS — J441 Chronic obstructive pulmonary disease with (acute) exacerbation: Secondary | ICD-10-CM | POA: Diagnosis not present

## 2021-12-27 DIAGNOSIS — I509 Heart failure, unspecified: Secondary | ICD-10-CM | POA: Diagnosis not present

## 2021-12-27 DIAGNOSIS — F0390 Unspecified dementia without behavioral disturbance: Secondary | ICD-10-CM | POA: Diagnosis not present

## 2021-12-27 DIAGNOSIS — Z87891 Personal history of nicotine dependence: Secondary | ICD-10-CM | POA: Diagnosis not present

## 2021-12-27 DIAGNOSIS — F39 Unspecified mood [affective] disorder: Secondary | ICD-10-CM | POA: Diagnosis not present

## 2021-12-27 DIAGNOSIS — E039 Hypothyroidism, unspecified: Secondary | ICD-10-CM | POA: Diagnosis not present

## 2021-12-27 DIAGNOSIS — J9601 Acute respiratory failure with hypoxia: Secondary | ICD-10-CM | POA: Diagnosis not present

## 2021-12-28 DIAGNOSIS — N400 Enlarged prostate without lower urinary tract symptoms: Secondary | ICD-10-CM | POA: Diagnosis not present

## 2021-12-28 DIAGNOSIS — E039 Hypothyroidism, unspecified: Secondary | ICD-10-CM | POA: Diagnosis not present

## 2021-12-28 DIAGNOSIS — F02818 Dementia in other diseases classified elsewhere, unspecified severity, with other behavioral disturbance: Secondary | ICD-10-CM | POA: Diagnosis not present

## 2021-12-28 DIAGNOSIS — I110 Hypertensive heart disease with heart failure: Secondary | ICD-10-CM | POA: Diagnosis not present

## 2021-12-28 DIAGNOSIS — G301 Alzheimer's disease with late onset: Secondary | ICD-10-CM | POA: Diagnosis not present

## 2021-12-28 DIAGNOSIS — J441 Chronic obstructive pulmonary disease with (acute) exacerbation: Secondary | ICD-10-CM | POA: Diagnosis not present

## 2021-12-28 DIAGNOSIS — F0390 Unspecified dementia without behavioral disturbance: Secondary | ICD-10-CM | POA: Diagnosis not present

## 2021-12-28 DIAGNOSIS — Z87891 Personal history of nicotine dependence: Secondary | ICD-10-CM | POA: Diagnosis not present

## 2021-12-28 DIAGNOSIS — F39 Unspecified mood [affective] disorder: Secondary | ICD-10-CM | POA: Diagnosis not present

## 2021-12-28 DIAGNOSIS — I509 Heart failure, unspecified: Secondary | ICD-10-CM | POA: Diagnosis not present

## 2021-12-28 DIAGNOSIS — I5021 Acute systolic (congestive) heart failure: Secondary | ICD-10-CM | POA: Diagnosis not present

## 2021-12-28 DIAGNOSIS — J9601 Acute respiratory failure with hypoxia: Secondary | ICD-10-CM | POA: Diagnosis not present

## 2021-12-29 DIAGNOSIS — F02B18 Dementia in other diseases classified elsewhere, moderate, with other behavioral disturbance: Secondary | ICD-10-CM | POA: Diagnosis present

## 2021-12-29 DIAGNOSIS — I5031 Acute diastolic (congestive) heart failure: Secondary | ICD-10-CM | POA: Diagnosis not present

## 2021-12-29 DIAGNOSIS — Z8616 Personal history of COVID-19: Secondary | ICD-10-CM | POA: Diagnosis not present

## 2021-12-29 DIAGNOSIS — J449 Chronic obstructive pulmonary disease, unspecified: Secondary | ICD-10-CM | POA: Diagnosis not present

## 2021-12-29 DIAGNOSIS — F0283 Dementia in other diseases classified elsewhere, unspecified severity, with mood disturbance: Secondary | ICD-10-CM | POA: Diagnosis not present

## 2021-12-29 DIAGNOSIS — R5381 Other malaise: Secondary | ICD-10-CM | POA: Diagnosis not present

## 2021-12-29 DIAGNOSIS — N400 Enlarged prostate without lower urinary tract symptoms: Secondary | ICD-10-CM | POA: Diagnosis not present

## 2021-12-29 DIAGNOSIS — M47812 Spondylosis without myelopathy or radiculopathy, cervical region: Secondary | ICD-10-CM | POA: Diagnosis not present

## 2021-12-29 DIAGNOSIS — F339 Major depressive disorder, recurrent, unspecified: Secondary | ICD-10-CM | POA: Diagnosis not present

## 2021-12-29 DIAGNOSIS — F02818 Dementia in other diseases classified elsewhere, unspecified severity, with other behavioral disturbance: Secondary | ICD-10-CM | POA: Diagnosis not present

## 2021-12-29 DIAGNOSIS — I5021 Acute systolic (congestive) heart failure: Secondary | ICD-10-CM | POA: Diagnosis not present

## 2021-12-29 DIAGNOSIS — I110 Hypertensive heart disease with heart failure: Secondary | ICD-10-CM | POA: Diagnosis not present

## 2021-12-29 DIAGNOSIS — E1122 Type 2 diabetes mellitus with diabetic chronic kidney disease: Secondary | ICD-10-CM | POA: Diagnosis not present

## 2021-12-29 DIAGNOSIS — N1831 Chronic kidney disease, stage 3a: Secondary | ICD-10-CM | POA: Diagnosis not present

## 2021-12-29 DIAGNOSIS — D473 Essential (hemorrhagic) thrombocythemia: Secondary | ICD-10-CM | POA: Diagnosis not present

## 2021-12-29 DIAGNOSIS — F0390 Unspecified dementia without behavioral disturbance: Secondary | ICD-10-CM | POA: Diagnosis not present

## 2021-12-29 DIAGNOSIS — J441 Chronic obstructive pulmonary disease with (acute) exacerbation: Secondary | ICD-10-CM | POA: Diagnosis not present

## 2021-12-29 DIAGNOSIS — B0230 Zoster ocular disease, unspecified: Secondary | ICD-10-CM | POA: Diagnosis not present

## 2021-12-29 DIAGNOSIS — R279 Unspecified lack of coordination: Secondary | ICD-10-CM | POA: Diagnosis not present

## 2021-12-29 DIAGNOSIS — M2550 Pain in unspecified joint: Secondary | ICD-10-CM | POA: Diagnosis present

## 2021-12-29 DIAGNOSIS — G309 Alzheimer's disease, unspecified: Secondary | ICD-10-CM | POA: Diagnosis not present

## 2021-12-29 DIAGNOSIS — I509 Heart failure, unspecified: Secondary | ICD-10-CM | POA: Diagnosis not present

## 2021-12-29 DIAGNOSIS — I2510 Atherosclerotic heart disease of native coronary artery without angina pectoris: Secondary | ICD-10-CM | POA: Diagnosis not present

## 2021-12-29 DIAGNOSIS — J9620 Acute and chronic respiratory failure, unspecified whether with hypoxia or hypercapnia: Secondary | ICD-10-CM | POA: Diagnosis not present

## 2021-12-29 DIAGNOSIS — G301 Alzheimer's disease with late onset: Secondary | ICD-10-CM | POA: Diagnosis not present

## 2021-12-29 DIAGNOSIS — I129 Hypertensive chronic kidney disease with stage 1 through stage 4 chronic kidney disease, or unspecified chronic kidney disease: Secondary | ICD-10-CM | POA: Diagnosis not present

## 2021-12-29 DIAGNOSIS — R443 Hallucinations, unspecified: Secondary | ICD-10-CM | POA: Diagnosis not present

## 2021-12-29 DIAGNOSIS — E785 Hyperlipidemia, unspecified: Secondary | ICD-10-CM | POA: Diagnosis not present

## 2021-12-29 DIAGNOSIS — F39 Unspecified mood [affective] disorder: Secondary | ICD-10-CM | POA: Diagnosis not present

## 2021-12-29 DIAGNOSIS — J984 Other disorders of lung: Secondary | ICD-10-CM | POA: Diagnosis present

## 2021-12-29 DIAGNOSIS — D509 Iron deficiency anemia, unspecified: Secondary | ICD-10-CM | POA: Diagnosis not present

## 2021-12-29 DIAGNOSIS — K219 Gastro-esophageal reflux disease without esophagitis: Secondary | ICD-10-CM | POA: Diagnosis not present

## 2021-12-29 DIAGNOSIS — Z743 Need for continuous supervision: Secondary | ICD-10-CM | POA: Diagnosis not present

## 2021-12-29 DIAGNOSIS — J9601 Acute respiratory failure with hypoxia: Secondary | ICD-10-CM | POA: Diagnosis not present

## 2021-12-29 DIAGNOSIS — Z9181 History of falling: Secondary | ICD-10-CM | POA: Diagnosis not present

## 2021-12-29 DIAGNOSIS — Z87891 Personal history of nicotine dependence: Secondary | ICD-10-CM | POA: Diagnosis not present

## 2021-12-29 DIAGNOSIS — Z6827 Body mass index (BMI) 27.0-27.9, adult: Secondary | ICD-10-CM | POA: Diagnosis not present

## 2021-12-29 DIAGNOSIS — I272 Pulmonary hypertension, unspecified: Secondary | ICD-10-CM | POA: Diagnosis not present

## 2021-12-29 DIAGNOSIS — M170 Bilateral primary osteoarthritis of knee: Secondary | ICD-10-CM | POA: Diagnosis not present

## 2021-12-29 DIAGNOSIS — G4733 Obstructive sleep apnea (adult) (pediatric): Secondary | ICD-10-CM | POA: Diagnosis not present

## 2021-12-29 DIAGNOSIS — E039 Hypothyroidism, unspecified: Secondary | ICD-10-CM | POA: Diagnosis not present

## 2021-12-29 DIAGNOSIS — I5032 Chronic diastolic (congestive) heart failure: Secondary | ICD-10-CM | POA: Diagnosis not present

## 2021-12-29 DIAGNOSIS — F320 Major depressive disorder, single episode, mild: Secondary | ICD-10-CM | POA: Diagnosis not present

## 2021-12-29 DIAGNOSIS — I5033 Acute on chronic diastolic (congestive) heart failure: Secondary | ICD-10-CM | POA: Diagnosis not present

## 2021-12-29 DIAGNOSIS — D519 Vitamin B12 deficiency anemia, unspecified: Secondary | ICD-10-CM | POA: Diagnosis not present

## 2021-12-29 DIAGNOSIS — J9611 Chronic respiratory failure with hypoxia: Secondary | ICD-10-CM | POA: Diagnosis not present

## 2021-12-29 DIAGNOSIS — I10 Essential (primary) hypertension: Secondary | ICD-10-CM | POA: Diagnosis not present

## 2021-12-29 DIAGNOSIS — E669 Obesity, unspecified: Secondary | ICD-10-CM | POA: Diagnosis not present

## 2021-12-29 DIAGNOSIS — N529 Male erectile dysfunction, unspecified: Secondary | ICD-10-CM | POA: Diagnosis not present

## 2021-12-29 DIAGNOSIS — E119 Type 2 diabetes mellitus without complications: Secondary | ICD-10-CM | POA: Diagnosis not present

## 2022-01-04 ENCOUNTER — Ambulatory Visit: Payer: Medicare Other | Admitting: Cardiology

## 2022-01-05 DIAGNOSIS — I5033 Acute on chronic diastolic (congestive) heart failure: Secondary | ICD-10-CM | POA: Diagnosis not present

## 2022-01-05 DIAGNOSIS — J449 Chronic obstructive pulmonary disease, unspecified: Secondary | ICD-10-CM | POA: Diagnosis not present

## 2022-01-05 DIAGNOSIS — F320 Major depressive disorder, single episode, mild: Secondary | ICD-10-CM | POA: Diagnosis not present

## 2022-01-05 DIAGNOSIS — E039 Hypothyroidism, unspecified: Secondary | ICD-10-CM | POA: Diagnosis not present

## 2022-01-05 DIAGNOSIS — F0390 Unspecified dementia without behavioral disturbance: Secondary | ICD-10-CM | POA: Diagnosis not present

## 2022-01-11 DIAGNOSIS — B0230 Zoster ocular disease, unspecified: Secondary | ICD-10-CM | POA: Diagnosis not present

## 2022-01-11 DIAGNOSIS — E785 Hyperlipidemia, unspecified: Secondary | ICD-10-CM | POA: Diagnosis not present

## 2022-01-11 DIAGNOSIS — N400 Enlarged prostate without lower urinary tract symptoms: Secondary | ICD-10-CM | POA: Diagnosis not present

## 2022-01-11 DIAGNOSIS — M47812 Spondylosis without myelopathy or radiculopathy, cervical region: Secondary | ICD-10-CM | POA: Diagnosis not present

## 2022-01-11 DIAGNOSIS — I272 Pulmonary hypertension, unspecified: Secondary | ICD-10-CM | POA: Diagnosis not present

## 2022-01-11 DIAGNOSIS — Z6827 Body mass index (BMI) 27.0-27.9, adult: Secondary | ICD-10-CM | POA: Diagnosis not present

## 2022-01-11 DIAGNOSIS — E119 Type 2 diabetes mellitus without complications: Secondary | ICD-10-CM | POA: Diagnosis not present

## 2022-01-11 DIAGNOSIS — I5032 Chronic diastolic (congestive) heart failure: Secondary | ICD-10-CM | POA: Diagnosis not present

## 2022-01-11 DIAGNOSIS — E669 Obesity, unspecified: Secondary | ICD-10-CM | POA: Diagnosis not present

## 2022-01-11 DIAGNOSIS — Z9181 History of falling: Secondary | ICD-10-CM | POA: Diagnosis not present

## 2022-01-11 DIAGNOSIS — D509 Iron deficiency anemia, unspecified: Secondary | ICD-10-CM | POA: Diagnosis not present

## 2022-01-11 DIAGNOSIS — M170 Bilateral primary osteoarthritis of knee: Secondary | ICD-10-CM | POA: Diagnosis not present

## 2022-01-11 DIAGNOSIS — K219 Gastro-esophageal reflux disease without esophagitis: Secondary | ICD-10-CM | POA: Diagnosis not present

## 2022-01-11 DIAGNOSIS — Z8616 Personal history of COVID-19: Secondary | ICD-10-CM | POA: Diagnosis not present

## 2022-01-11 DIAGNOSIS — N529 Male erectile dysfunction, unspecified: Secondary | ICD-10-CM | POA: Diagnosis not present

## 2022-01-11 DIAGNOSIS — D519 Vitamin B12 deficiency anemia, unspecified: Secondary | ICD-10-CM | POA: Diagnosis not present

## 2022-01-11 DIAGNOSIS — E039 Hypothyroidism, unspecified: Secondary | ICD-10-CM | POA: Diagnosis not present

## 2022-01-11 DIAGNOSIS — I110 Hypertensive heart disease with heart failure: Secondary | ICD-10-CM | POA: Diagnosis not present

## 2022-01-11 DIAGNOSIS — R443 Hallucinations, unspecified: Secondary | ICD-10-CM | POA: Diagnosis not present

## 2022-01-11 DIAGNOSIS — I2510 Atherosclerotic heart disease of native coronary artery without angina pectoris: Secondary | ICD-10-CM | POA: Diagnosis not present

## 2022-01-12 DIAGNOSIS — F0283 Dementia in other diseases classified elsewhere, unspecified severity, with mood disturbance: Secondary | ICD-10-CM | POA: Diagnosis not present

## 2022-01-12 DIAGNOSIS — E1122 Type 2 diabetes mellitus with diabetic chronic kidney disease: Secondary | ICD-10-CM | POA: Diagnosis not present

## 2022-01-12 DIAGNOSIS — J9620 Acute and chronic respiratory failure, unspecified whether with hypoxia or hypercapnia: Secondary | ICD-10-CM | POA: Diagnosis not present

## 2022-01-12 DIAGNOSIS — I5031 Acute diastolic (congestive) heart failure: Secondary | ICD-10-CM | POA: Diagnosis not present

## 2022-01-12 DIAGNOSIS — N400 Enlarged prostate without lower urinary tract symptoms: Secondary | ICD-10-CM | POA: Diagnosis not present

## 2022-01-12 DIAGNOSIS — D473 Essential (hemorrhagic) thrombocythemia: Secondary | ICD-10-CM | POA: Diagnosis not present

## 2022-01-12 DIAGNOSIS — R5381 Other malaise: Secondary | ICD-10-CM | POA: Diagnosis not present

## 2022-01-12 DIAGNOSIS — K219 Gastro-esophageal reflux disease without esophagitis: Secondary | ICD-10-CM | POA: Diagnosis not present

## 2022-01-12 DIAGNOSIS — G309 Alzheimer's disease, unspecified: Secondary | ICD-10-CM | POA: Diagnosis not present

## 2022-01-12 DIAGNOSIS — E785 Hyperlipidemia, unspecified: Secondary | ICD-10-CM | POA: Diagnosis not present

## 2022-01-12 DIAGNOSIS — N1831 Chronic kidney disease, stage 3a: Secondary | ICD-10-CM | POA: Diagnosis not present

## 2022-01-12 DIAGNOSIS — I129 Hypertensive chronic kidney disease with stage 1 through stage 4 chronic kidney disease, or unspecified chronic kidney disease: Secondary | ICD-10-CM | POA: Diagnosis not present

## 2022-01-12 DIAGNOSIS — F339 Major depressive disorder, recurrent, unspecified: Secondary | ICD-10-CM | POA: Diagnosis not present

## 2022-01-12 DIAGNOSIS — J984 Other disorders of lung: Secondary | ICD-10-CM | POA: Diagnosis not present

## 2022-01-12 DIAGNOSIS — J449 Chronic obstructive pulmonary disease, unspecified: Secondary | ICD-10-CM | POA: Diagnosis not present

## 2022-01-12 DIAGNOSIS — E039 Hypothyroidism, unspecified: Secondary | ICD-10-CM | POA: Diagnosis not present

## 2022-01-19 DIAGNOSIS — G309 Alzheimer's disease, unspecified: Secondary | ICD-10-CM | POA: Diagnosis not present

## 2022-01-19 DIAGNOSIS — K219 Gastro-esophageal reflux disease without esophagitis: Secondary | ICD-10-CM | POA: Diagnosis not present

## 2022-01-19 DIAGNOSIS — E039 Hypothyroidism, unspecified: Secondary | ICD-10-CM | POA: Diagnosis not present

## 2022-01-19 DIAGNOSIS — I10 Essential (primary) hypertension: Secondary | ICD-10-CM | POA: Diagnosis not present

## 2022-01-19 DIAGNOSIS — I5032 Chronic diastolic (congestive) heart failure: Secondary | ICD-10-CM | POA: Diagnosis not present

## 2022-01-19 DIAGNOSIS — F339 Major depressive disorder, recurrent, unspecified: Secondary | ICD-10-CM | POA: Diagnosis not present

## 2022-01-19 DIAGNOSIS — E785 Hyperlipidemia, unspecified: Secondary | ICD-10-CM | POA: Diagnosis not present

## 2022-01-19 DIAGNOSIS — F39 Unspecified mood [affective] disorder: Secondary | ICD-10-CM | POA: Diagnosis not present

## 2022-01-19 DIAGNOSIS — J449 Chronic obstructive pulmonary disease, unspecified: Secondary | ICD-10-CM | POA: Diagnosis not present

## 2022-01-23 DIAGNOSIS — I110 Hypertensive heart disease with heart failure: Secondary | ICD-10-CM | POA: Diagnosis not present

## 2022-01-23 DIAGNOSIS — R443 Hallucinations, unspecified: Secondary | ICD-10-CM | POA: Diagnosis not present

## 2022-01-23 DIAGNOSIS — I5032 Chronic diastolic (congestive) heart failure: Secondary | ICD-10-CM | POA: Diagnosis not present

## 2022-01-23 DIAGNOSIS — J984 Other disorders of lung: Secondary | ICD-10-CM | POA: Diagnosis not present

## 2022-01-23 DIAGNOSIS — I2510 Atherosclerotic heart disease of native coronary artery without angina pectoris: Secondary | ICD-10-CM | POA: Diagnosis not present

## 2022-01-23 DIAGNOSIS — E119 Type 2 diabetes mellitus without complications: Secondary | ICD-10-CM | POA: Diagnosis not present

## 2022-01-23 DIAGNOSIS — I272 Pulmonary hypertension, unspecified: Secondary | ICD-10-CM | POA: Diagnosis not present

## 2022-01-24 DIAGNOSIS — N400 Enlarged prostate without lower urinary tract symptoms: Secondary | ICD-10-CM | POA: Diagnosis not present

## 2022-01-24 DIAGNOSIS — J449 Chronic obstructive pulmonary disease, unspecified: Secondary | ICD-10-CM | POA: Diagnosis not present

## 2022-01-24 DIAGNOSIS — Z8673 Personal history of transient ischemic attack (TIA), and cerebral infarction without residual deficits: Secondary | ICD-10-CM | POA: Diagnosis not present

## 2022-01-24 DIAGNOSIS — I5032 Chronic diastolic (congestive) heart failure: Secondary | ICD-10-CM | POA: Diagnosis not present

## 2022-01-24 DIAGNOSIS — I110 Hypertensive heart disease with heart failure: Secondary | ICD-10-CM | POA: Diagnosis not present

## 2022-01-24 DIAGNOSIS — E119 Type 2 diabetes mellitus without complications: Secondary | ICD-10-CM | POA: Diagnosis not present

## 2022-01-24 DIAGNOSIS — R443 Hallucinations, unspecified: Secondary | ICD-10-CM | POA: Diagnosis not present

## 2022-01-24 DIAGNOSIS — I130 Hypertensive heart and chronic kidney disease with heart failure and stage 1 through stage 4 chronic kidney disease, or unspecified chronic kidney disease: Secondary | ICD-10-CM | POA: Diagnosis not present

## 2022-01-24 DIAGNOSIS — I2510 Atherosclerotic heart disease of native coronary artery without angina pectoris: Secondary | ICD-10-CM | POA: Diagnosis not present

## 2022-01-24 DIAGNOSIS — F320 Major depressive disorder, single episode, mild: Secondary | ICD-10-CM | POA: Diagnosis not present

## 2022-01-24 DIAGNOSIS — F0390 Unspecified dementia without behavioral disturbance: Secondary | ICD-10-CM | POA: Diagnosis not present

## 2022-01-24 DIAGNOSIS — E118 Type 2 diabetes mellitus with unspecified complications: Secondary | ICD-10-CM | POA: Diagnosis not present

## 2022-01-24 DIAGNOSIS — I272 Pulmonary hypertension, unspecified: Secondary | ICD-10-CM | POA: Diagnosis not present

## 2022-01-24 DIAGNOSIS — E039 Hypothyroidism, unspecified: Secondary | ICD-10-CM | POA: Diagnosis not present

## 2022-01-24 DIAGNOSIS — N1831 Chronic kidney disease, stage 3a: Secondary | ICD-10-CM | POA: Diagnosis not present

## 2022-01-24 DIAGNOSIS — R5381 Other malaise: Secondary | ICD-10-CM | POA: Diagnosis not present

## 2022-01-24 DIAGNOSIS — K219 Gastro-esophageal reflux disease without esophagitis: Secondary | ICD-10-CM | POA: Diagnosis not present

## 2022-01-25 DIAGNOSIS — I2510 Atherosclerotic heart disease of native coronary artery without angina pectoris: Secondary | ICD-10-CM | POA: Diagnosis not present

## 2022-01-25 DIAGNOSIS — I272 Pulmonary hypertension, unspecified: Secondary | ICD-10-CM | POA: Diagnosis not present

## 2022-01-25 DIAGNOSIS — E119 Type 2 diabetes mellitus without complications: Secondary | ICD-10-CM | POA: Diagnosis not present

## 2022-01-25 DIAGNOSIS — R443 Hallucinations, unspecified: Secondary | ICD-10-CM | POA: Diagnosis not present

## 2022-01-25 DIAGNOSIS — I110 Hypertensive heart disease with heart failure: Secondary | ICD-10-CM | POA: Diagnosis not present

## 2022-01-25 DIAGNOSIS — I5032 Chronic diastolic (congestive) heart failure: Secondary | ICD-10-CM | POA: Diagnosis not present

## 2022-01-26 DIAGNOSIS — I5032 Chronic diastolic (congestive) heart failure: Secondary | ICD-10-CM | POA: Diagnosis not present

## 2022-01-26 DIAGNOSIS — I272 Pulmonary hypertension, unspecified: Secondary | ICD-10-CM | POA: Diagnosis not present

## 2022-01-26 DIAGNOSIS — E119 Type 2 diabetes mellitus without complications: Secondary | ICD-10-CM | POA: Diagnosis not present

## 2022-01-26 DIAGNOSIS — I110 Hypertensive heart disease with heart failure: Secondary | ICD-10-CM | POA: Diagnosis not present

## 2022-01-26 DIAGNOSIS — R443 Hallucinations, unspecified: Secondary | ICD-10-CM | POA: Diagnosis not present

## 2022-01-26 DIAGNOSIS — I2510 Atherosclerotic heart disease of native coronary artery without angina pectoris: Secondary | ICD-10-CM | POA: Diagnosis not present

## 2022-01-27 DIAGNOSIS — R443 Hallucinations, unspecified: Secondary | ICD-10-CM | POA: Diagnosis not present

## 2022-01-27 DIAGNOSIS — I110 Hypertensive heart disease with heart failure: Secondary | ICD-10-CM | POA: Diagnosis not present

## 2022-01-27 DIAGNOSIS — I5032 Chronic diastolic (congestive) heart failure: Secondary | ICD-10-CM | POA: Diagnosis not present

## 2022-01-27 DIAGNOSIS — E119 Type 2 diabetes mellitus without complications: Secondary | ICD-10-CM | POA: Diagnosis not present

## 2022-01-27 DIAGNOSIS — I272 Pulmonary hypertension, unspecified: Secondary | ICD-10-CM | POA: Diagnosis not present

## 2022-01-27 DIAGNOSIS — I2510 Atherosclerotic heart disease of native coronary artery without angina pectoris: Secondary | ICD-10-CM | POA: Diagnosis not present

## 2022-01-29 DIAGNOSIS — I2510 Atherosclerotic heart disease of native coronary artery without angina pectoris: Secondary | ICD-10-CM | POA: Diagnosis not present

## 2022-01-29 DIAGNOSIS — I110 Hypertensive heart disease with heart failure: Secondary | ICD-10-CM | POA: Diagnosis not present

## 2022-01-29 DIAGNOSIS — I5032 Chronic diastolic (congestive) heart failure: Secondary | ICD-10-CM | POA: Diagnosis not present

## 2022-01-29 DIAGNOSIS — I272 Pulmonary hypertension, unspecified: Secondary | ICD-10-CM | POA: Diagnosis not present

## 2022-01-29 DIAGNOSIS — R443 Hallucinations, unspecified: Secondary | ICD-10-CM | POA: Diagnosis not present

## 2022-01-29 DIAGNOSIS — E119 Type 2 diabetes mellitus without complications: Secondary | ICD-10-CM | POA: Diagnosis not present

## 2022-01-31 DIAGNOSIS — I110 Hypertensive heart disease with heart failure: Secondary | ICD-10-CM | POA: Diagnosis not present

## 2022-01-31 DIAGNOSIS — I2510 Atherosclerotic heart disease of native coronary artery without angina pectoris: Secondary | ICD-10-CM | POA: Diagnosis not present

## 2022-01-31 DIAGNOSIS — R443 Hallucinations, unspecified: Secondary | ICD-10-CM | POA: Diagnosis not present

## 2022-01-31 DIAGNOSIS — I5032 Chronic diastolic (congestive) heart failure: Secondary | ICD-10-CM | POA: Diagnosis not present

## 2022-01-31 DIAGNOSIS — E119 Type 2 diabetes mellitus without complications: Secondary | ICD-10-CM | POA: Diagnosis not present

## 2022-01-31 DIAGNOSIS — I272 Pulmonary hypertension, unspecified: Secondary | ICD-10-CM | POA: Diagnosis not present

## 2022-02-01 ENCOUNTER — Other Ambulatory Visit: Payer: Self-pay | Admitting: Family Medicine

## 2022-02-01 DIAGNOSIS — I2510 Atherosclerotic heart disease of native coronary artery without angina pectoris: Secondary | ICD-10-CM | POA: Diagnosis not present

## 2022-02-01 DIAGNOSIS — E119 Type 2 diabetes mellitus without complications: Secondary | ICD-10-CM | POA: Diagnosis not present

## 2022-02-01 DIAGNOSIS — I272 Pulmonary hypertension, unspecified: Secondary | ICD-10-CM | POA: Diagnosis not present

## 2022-02-01 DIAGNOSIS — I10 Essential (primary) hypertension: Secondary | ICD-10-CM

## 2022-02-01 DIAGNOSIS — I5032 Chronic diastolic (congestive) heart failure: Secondary | ICD-10-CM | POA: Diagnosis not present

## 2022-02-01 DIAGNOSIS — R443 Hallucinations, unspecified: Secondary | ICD-10-CM | POA: Diagnosis not present

## 2022-02-01 DIAGNOSIS — I110 Hypertensive heart disease with heart failure: Secondary | ICD-10-CM | POA: Diagnosis not present

## 2022-02-02 DIAGNOSIS — R443 Hallucinations, unspecified: Secondary | ICD-10-CM | POA: Diagnosis not present

## 2022-02-02 DIAGNOSIS — I2510 Atherosclerotic heart disease of native coronary artery without angina pectoris: Secondary | ICD-10-CM | POA: Diagnosis not present

## 2022-02-02 DIAGNOSIS — E119 Type 2 diabetes mellitus without complications: Secondary | ICD-10-CM | POA: Diagnosis not present

## 2022-02-02 DIAGNOSIS — I110 Hypertensive heart disease with heart failure: Secondary | ICD-10-CM | POA: Diagnosis not present

## 2022-02-02 DIAGNOSIS — I5032 Chronic diastolic (congestive) heart failure: Secondary | ICD-10-CM | POA: Diagnosis not present

## 2022-02-02 DIAGNOSIS — I272 Pulmonary hypertension, unspecified: Secondary | ICD-10-CM | POA: Diagnosis not present

## 2022-02-05 ENCOUNTER — Other Ambulatory Visit: Payer: Self-pay | Admitting: Family Medicine

## 2022-02-05 DIAGNOSIS — R443 Hallucinations, unspecified: Secondary | ICD-10-CM | POA: Diagnosis not present

## 2022-02-05 DIAGNOSIS — E119 Type 2 diabetes mellitus without complications: Secondary | ICD-10-CM | POA: Diagnosis not present

## 2022-02-05 DIAGNOSIS — I5032 Chronic diastolic (congestive) heart failure: Secondary | ICD-10-CM | POA: Diagnosis not present

## 2022-02-05 DIAGNOSIS — I10 Essential (primary) hypertension: Secondary | ICD-10-CM

## 2022-02-05 DIAGNOSIS — I272 Pulmonary hypertension, unspecified: Secondary | ICD-10-CM | POA: Diagnosis not present

## 2022-02-05 DIAGNOSIS — I2510 Atherosclerotic heart disease of native coronary artery without angina pectoris: Secondary | ICD-10-CM | POA: Diagnosis not present

## 2022-02-05 DIAGNOSIS — I110 Hypertensive heart disease with heart failure: Secondary | ICD-10-CM | POA: Diagnosis not present

## 2022-02-08 DIAGNOSIS — I2510 Atherosclerotic heart disease of native coronary artery without angina pectoris: Secondary | ICD-10-CM | POA: Diagnosis not present

## 2022-02-08 DIAGNOSIS — I110 Hypertensive heart disease with heart failure: Secondary | ICD-10-CM | POA: Diagnosis not present

## 2022-02-08 DIAGNOSIS — R443 Hallucinations, unspecified: Secondary | ICD-10-CM | POA: Diagnosis not present

## 2022-02-08 DIAGNOSIS — I272 Pulmonary hypertension, unspecified: Secondary | ICD-10-CM | POA: Diagnosis not present

## 2022-02-08 DIAGNOSIS — E119 Type 2 diabetes mellitus without complications: Secondary | ICD-10-CM | POA: Diagnosis not present

## 2022-02-08 DIAGNOSIS — I5032 Chronic diastolic (congestive) heart failure: Secondary | ICD-10-CM | POA: Diagnosis not present

## 2022-02-10 DIAGNOSIS — F0390 Unspecified dementia without behavioral disturbance: Secondary | ICD-10-CM | POA: Diagnosis not present

## 2022-02-10 DIAGNOSIS — I272 Pulmonary hypertension, unspecified: Secondary | ICD-10-CM | POA: Diagnosis not present

## 2022-02-10 DIAGNOSIS — I110 Hypertensive heart disease with heart failure: Secondary | ICD-10-CM | POA: Diagnosis not present

## 2022-02-10 DIAGNOSIS — Z9981 Dependence on supplemental oxygen: Secondary | ICD-10-CM | POA: Diagnosis not present

## 2022-02-10 DIAGNOSIS — Z8616 Personal history of COVID-19: Secondary | ICD-10-CM | POA: Diagnosis not present

## 2022-02-10 DIAGNOSIS — N400 Enlarged prostate without lower urinary tract symptoms: Secondary | ICD-10-CM | POA: Diagnosis not present

## 2022-02-10 DIAGNOSIS — G252 Other specified forms of tremor: Secondary | ICD-10-CM | POA: Diagnosis not present

## 2022-02-10 DIAGNOSIS — E669 Obesity, unspecified: Secondary | ICD-10-CM | POA: Diagnosis not present

## 2022-02-10 DIAGNOSIS — E785 Hyperlipidemia, unspecified: Secondary | ICD-10-CM | POA: Diagnosis not present

## 2022-02-10 DIAGNOSIS — K219 Gastro-esophageal reflux disease without esophagitis: Secondary | ICD-10-CM | POA: Diagnosis not present

## 2022-02-10 DIAGNOSIS — D519 Vitamin B12 deficiency anemia, unspecified: Secondary | ICD-10-CM | POA: Diagnosis not present

## 2022-02-10 DIAGNOSIS — N529 Male erectile dysfunction, unspecified: Secondary | ICD-10-CM | POA: Diagnosis not present

## 2022-02-10 DIAGNOSIS — J9601 Acute respiratory failure with hypoxia: Secondary | ICD-10-CM | POA: Diagnosis not present

## 2022-02-10 DIAGNOSIS — I2510 Atherosclerotic heart disease of native coronary artery without angina pectoris: Secondary | ICD-10-CM | POA: Diagnosis not present

## 2022-02-10 DIAGNOSIS — E039 Hypothyroidism, unspecified: Secondary | ICD-10-CM | POA: Diagnosis not present

## 2022-02-10 DIAGNOSIS — D509 Iron deficiency anemia, unspecified: Secondary | ICD-10-CM | POA: Diagnosis not present

## 2022-02-10 DIAGNOSIS — M170 Bilateral primary osteoarthritis of knee: Secondary | ICD-10-CM | POA: Diagnosis not present

## 2022-02-10 DIAGNOSIS — Z9181 History of falling: Secondary | ICD-10-CM | POA: Diagnosis not present

## 2022-02-10 DIAGNOSIS — Z7952 Long term (current) use of systemic steroids: Secondary | ICD-10-CM | POA: Diagnosis not present

## 2022-02-10 DIAGNOSIS — J449 Chronic obstructive pulmonary disease, unspecified: Secondary | ICD-10-CM | POA: Diagnosis not present

## 2022-02-10 DIAGNOSIS — Z6827 Body mass index (BMI) 27.0-27.9, adult: Secondary | ICD-10-CM | POA: Diagnosis not present

## 2022-02-10 DIAGNOSIS — I5032 Chronic diastolic (congestive) heart failure: Secondary | ICD-10-CM | POA: Diagnosis not present

## 2022-02-10 DIAGNOSIS — E119 Type 2 diabetes mellitus without complications: Secondary | ICD-10-CM | POA: Diagnosis not present

## 2022-02-10 DIAGNOSIS — Z8673 Personal history of transient ischemic attack (TIA), and cerebral infarction without residual deficits: Secondary | ICD-10-CM | POA: Diagnosis not present

## 2022-02-10 DIAGNOSIS — M47812 Spondylosis without myelopathy or radiculopathy, cervical region: Secondary | ICD-10-CM | POA: Diagnosis not present

## 2022-02-14 DIAGNOSIS — R1310 Dysphagia, unspecified: Secondary | ICD-10-CM | POA: Diagnosis not present

## 2022-02-14 DIAGNOSIS — I110 Hypertensive heart disease with heart failure: Secondary | ICD-10-CM | POA: Diagnosis not present

## 2022-02-14 DIAGNOSIS — J449 Chronic obstructive pulmonary disease, unspecified: Secondary | ICD-10-CM | POA: Diagnosis not present

## 2022-02-14 DIAGNOSIS — F0390 Unspecified dementia without behavioral disturbance: Secondary | ICD-10-CM | POA: Diagnosis not present

## 2022-02-14 DIAGNOSIS — J9601 Acute respiratory failure with hypoxia: Secondary | ICD-10-CM | POA: Diagnosis not present

## 2022-02-14 DIAGNOSIS — I272 Pulmonary hypertension, unspecified: Secondary | ICD-10-CM | POA: Diagnosis not present

## 2022-02-14 DIAGNOSIS — Z8673 Personal history of transient ischemic attack (TIA), and cerebral infarction without residual deficits: Secondary | ICD-10-CM | POA: Diagnosis not present

## 2022-02-14 DIAGNOSIS — I5032 Chronic diastolic (congestive) heart failure: Secondary | ICD-10-CM | POA: Diagnosis not present

## 2022-02-15 DIAGNOSIS — I272 Pulmonary hypertension, unspecified: Secondary | ICD-10-CM | POA: Diagnosis not present

## 2022-02-15 DIAGNOSIS — J9601 Acute respiratory failure with hypoxia: Secondary | ICD-10-CM | POA: Diagnosis not present

## 2022-02-15 DIAGNOSIS — I110 Hypertensive heart disease with heart failure: Secondary | ICD-10-CM | POA: Diagnosis not present

## 2022-02-15 DIAGNOSIS — F0390 Unspecified dementia without behavioral disturbance: Secondary | ICD-10-CM | POA: Diagnosis not present

## 2022-02-15 DIAGNOSIS — I5032 Chronic diastolic (congestive) heart failure: Secondary | ICD-10-CM | POA: Diagnosis not present

## 2022-02-15 DIAGNOSIS — J449 Chronic obstructive pulmonary disease, unspecified: Secondary | ICD-10-CM | POA: Diagnosis not present

## 2022-02-16 DIAGNOSIS — F0390 Unspecified dementia without behavioral disturbance: Secondary | ICD-10-CM | POA: Diagnosis not present

## 2022-02-16 DIAGNOSIS — I110 Hypertensive heart disease with heart failure: Secondary | ICD-10-CM | POA: Diagnosis not present

## 2022-02-16 DIAGNOSIS — J9601 Acute respiratory failure with hypoxia: Secondary | ICD-10-CM | POA: Diagnosis not present

## 2022-02-16 DIAGNOSIS — I272 Pulmonary hypertension, unspecified: Secondary | ICD-10-CM | POA: Diagnosis not present

## 2022-02-16 DIAGNOSIS — J449 Chronic obstructive pulmonary disease, unspecified: Secondary | ICD-10-CM | POA: Diagnosis not present

## 2022-02-16 DIAGNOSIS — I5032 Chronic diastolic (congestive) heart failure: Secondary | ICD-10-CM | POA: Diagnosis not present

## 2022-02-20 DIAGNOSIS — I110 Hypertensive heart disease with heart failure: Secondary | ICD-10-CM | POA: Diagnosis not present

## 2022-02-20 DIAGNOSIS — I272 Pulmonary hypertension, unspecified: Secondary | ICD-10-CM | POA: Diagnosis not present

## 2022-02-20 DIAGNOSIS — J9601 Acute respiratory failure with hypoxia: Secondary | ICD-10-CM | POA: Diagnosis not present

## 2022-02-20 DIAGNOSIS — I5032 Chronic diastolic (congestive) heart failure: Secondary | ICD-10-CM | POA: Diagnosis not present

## 2022-02-20 DIAGNOSIS — J449 Chronic obstructive pulmonary disease, unspecified: Secondary | ICD-10-CM | POA: Diagnosis not present

## 2022-02-20 DIAGNOSIS — F0390 Unspecified dementia without behavioral disturbance: Secondary | ICD-10-CM | POA: Diagnosis not present

## 2022-02-21 DIAGNOSIS — F0390 Unspecified dementia without behavioral disturbance: Secondary | ICD-10-CM | POA: Diagnosis not present

## 2022-02-21 DIAGNOSIS — I110 Hypertensive heart disease with heart failure: Secondary | ICD-10-CM | POA: Diagnosis not present

## 2022-02-21 DIAGNOSIS — J449 Chronic obstructive pulmonary disease, unspecified: Secondary | ICD-10-CM | POA: Diagnosis not present

## 2022-02-21 DIAGNOSIS — I5032 Chronic diastolic (congestive) heart failure: Secondary | ICD-10-CM | POA: Diagnosis not present

## 2022-02-21 DIAGNOSIS — I272 Pulmonary hypertension, unspecified: Secondary | ICD-10-CM | POA: Diagnosis not present

## 2022-02-21 DIAGNOSIS — J9601 Acute respiratory failure with hypoxia: Secondary | ICD-10-CM | POA: Diagnosis not present

## 2022-02-23 DIAGNOSIS — J449 Chronic obstructive pulmonary disease, unspecified: Secondary | ICD-10-CM | POA: Diagnosis not present

## 2022-02-23 DIAGNOSIS — I110 Hypertensive heart disease with heart failure: Secondary | ICD-10-CM | POA: Diagnosis not present

## 2022-02-23 DIAGNOSIS — I5032 Chronic diastolic (congestive) heart failure: Secondary | ICD-10-CM | POA: Diagnosis not present

## 2022-02-23 DIAGNOSIS — I272 Pulmonary hypertension, unspecified: Secondary | ICD-10-CM | POA: Diagnosis not present

## 2022-02-23 DIAGNOSIS — J9601 Acute respiratory failure with hypoxia: Secondary | ICD-10-CM | POA: Diagnosis not present

## 2022-02-23 DIAGNOSIS — F0390 Unspecified dementia without behavioral disturbance: Secondary | ICD-10-CM | POA: Diagnosis not present

## 2022-02-24 DIAGNOSIS — J9601 Acute respiratory failure with hypoxia: Secondary | ICD-10-CM | POA: Diagnosis not present

## 2022-02-24 DIAGNOSIS — I5032 Chronic diastolic (congestive) heart failure: Secondary | ICD-10-CM | POA: Diagnosis not present

## 2022-02-24 DIAGNOSIS — I110 Hypertensive heart disease with heart failure: Secondary | ICD-10-CM | POA: Diagnosis not present

## 2022-02-24 DIAGNOSIS — J449 Chronic obstructive pulmonary disease, unspecified: Secondary | ICD-10-CM | POA: Diagnosis not present

## 2022-02-24 DIAGNOSIS — I272 Pulmonary hypertension, unspecified: Secondary | ICD-10-CM | POA: Diagnosis not present

## 2022-02-24 DIAGNOSIS — F0390 Unspecified dementia without behavioral disturbance: Secondary | ICD-10-CM | POA: Diagnosis not present

## 2022-02-27 DIAGNOSIS — N401 Enlarged prostate with lower urinary tract symptoms: Secondary | ICD-10-CM | POA: Diagnosis not present

## 2022-02-28 DIAGNOSIS — I5032 Chronic diastolic (congestive) heart failure: Secondary | ICD-10-CM | POA: Diagnosis not present

## 2022-02-28 DIAGNOSIS — F0390 Unspecified dementia without behavioral disturbance: Secondary | ICD-10-CM | POA: Diagnosis not present

## 2022-02-28 DIAGNOSIS — I110 Hypertensive heart disease with heart failure: Secondary | ICD-10-CM | POA: Diagnosis not present

## 2022-02-28 DIAGNOSIS — J9601 Acute respiratory failure with hypoxia: Secondary | ICD-10-CM | POA: Diagnosis not present

## 2022-02-28 DIAGNOSIS — I272 Pulmonary hypertension, unspecified: Secondary | ICD-10-CM | POA: Diagnosis not present

## 2022-02-28 DIAGNOSIS — J449 Chronic obstructive pulmonary disease, unspecified: Secondary | ICD-10-CM | POA: Diagnosis not present

## 2022-03-02 DIAGNOSIS — J449 Chronic obstructive pulmonary disease, unspecified: Secondary | ICD-10-CM | POA: Diagnosis not present

## 2022-03-02 DIAGNOSIS — I5032 Chronic diastolic (congestive) heart failure: Secondary | ICD-10-CM | POA: Diagnosis not present

## 2022-03-02 DIAGNOSIS — J9601 Acute respiratory failure with hypoxia: Secondary | ICD-10-CM | POA: Diagnosis not present

## 2022-03-02 DIAGNOSIS — I110 Hypertensive heart disease with heart failure: Secondary | ICD-10-CM | POA: Diagnosis not present

## 2022-03-02 DIAGNOSIS — F0390 Unspecified dementia without behavioral disturbance: Secondary | ICD-10-CM | POA: Diagnosis not present

## 2022-03-02 DIAGNOSIS — I272 Pulmonary hypertension, unspecified: Secondary | ICD-10-CM | POA: Diagnosis not present

## 2022-03-07 DIAGNOSIS — E118 Type 2 diabetes mellitus with unspecified complications: Secondary | ICD-10-CM | POA: Diagnosis not present

## 2022-03-07 DIAGNOSIS — I10 Essential (primary) hypertension: Secondary | ICD-10-CM | POA: Diagnosis not present

## 2022-03-07 DIAGNOSIS — J449 Chronic obstructive pulmonary disease, unspecified: Secondary | ICD-10-CM | POA: Diagnosis not present

## 2022-03-07 DIAGNOSIS — J9611 Chronic respiratory failure with hypoxia: Secondary | ICD-10-CM | POA: Diagnosis not present

## 2022-03-08 DIAGNOSIS — J449 Chronic obstructive pulmonary disease, unspecified: Secondary | ICD-10-CM | POA: Diagnosis not present

## 2022-03-08 DIAGNOSIS — J9601 Acute respiratory failure with hypoxia: Secondary | ICD-10-CM | POA: Diagnosis not present

## 2022-03-08 DIAGNOSIS — I5032 Chronic diastolic (congestive) heart failure: Secondary | ICD-10-CM | POA: Diagnosis not present

## 2022-03-08 DIAGNOSIS — F0390 Unspecified dementia without behavioral disturbance: Secondary | ICD-10-CM | POA: Diagnosis not present

## 2022-03-08 DIAGNOSIS — I110 Hypertensive heart disease with heart failure: Secondary | ICD-10-CM | POA: Diagnosis not present

## 2022-03-08 DIAGNOSIS — I272 Pulmonary hypertension, unspecified: Secondary | ICD-10-CM | POA: Diagnosis not present

## 2022-03-09 DIAGNOSIS — J449 Chronic obstructive pulmonary disease, unspecified: Secondary | ICD-10-CM | POA: Diagnosis not present

## 2022-03-09 DIAGNOSIS — I517 Cardiomegaly: Secondary | ICD-10-CM | POA: Diagnosis not present

## 2022-03-09 DIAGNOSIS — J9811 Atelectasis: Secondary | ICD-10-CM | POA: Diagnosis not present

## 2022-03-10 DIAGNOSIS — J9601 Acute respiratory failure with hypoxia: Secondary | ICD-10-CM | POA: Diagnosis not present

## 2022-03-10 DIAGNOSIS — J449 Chronic obstructive pulmonary disease, unspecified: Secondary | ICD-10-CM | POA: Diagnosis not present

## 2022-03-10 DIAGNOSIS — I110 Hypertensive heart disease with heart failure: Secondary | ICD-10-CM | POA: Diagnosis not present

## 2022-03-10 DIAGNOSIS — I272 Pulmonary hypertension, unspecified: Secondary | ICD-10-CM | POA: Diagnosis not present

## 2022-03-10 DIAGNOSIS — I5032 Chronic diastolic (congestive) heart failure: Secondary | ICD-10-CM | POA: Diagnosis not present

## 2022-03-10 DIAGNOSIS — F0390 Unspecified dementia without behavioral disturbance: Secondary | ICD-10-CM | POA: Diagnosis not present

## 2022-03-12 DIAGNOSIS — Z6827 Body mass index (BMI) 27.0-27.9, adult: Secondary | ICD-10-CM | POA: Diagnosis not present

## 2022-03-12 DIAGNOSIS — J449 Chronic obstructive pulmonary disease, unspecified: Secondary | ICD-10-CM | POA: Diagnosis not present

## 2022-03-12 DIAGNOSIS — J9601 Acute respiratory failure with hypoxia: Secondary | ICD-10-CM | POA: Diagnosis not present

## 2022-03-12 DIAGNOSIS — Z8673 Personal history of transient ischemic attack (TIA), and cerebral infarction without residual deficits: Secondary | ICD-10-CM | POA: Diagnosis not present

## 2022-03-12 DIAGNOSIS — I2510 Atherosclerotic heart disease of native coronary artery without angina pectoris: Secondary | ICD-10-CM | POA: Diagnosis not present

## 2022-03-12 DIAGNOSIS — G252 Other specified forms of tremor: Secondary | ICD-10-CM | POA: Diagnosis not present

## 2022-03-12 DIAGNOSIS — Z9981 Dependence on supplemental oxygen: Secondary | ICD-10-CM | POA: Diagnosis not present

## 2022-03-12 DIAGNOSIS — E669 Obesity, unspecified: Secondary | ICD-10-CM | POA: Diagnosis not present

## 2022-03-12 DIAGNOSIS — I110 Hypertensive heart disease with heart failure: Secondary | ICD-10-CM | POA: Diagnosis not present

## 2022-03-12 DIAGNOSIS — K219 Gastro-esophageal reflux disease without esophagitis: Secondary | ICD-10-CM | POA: Diagnosis not present

## 2022-03-12 DIAGNOSIS — N400 Enlarged prostate without lower urinary tract symptoms: Secondary | ICD-10-CM | POA: Diagnosis not present

## 2022-03-12 DIAGNOSIS — E119 Type 2 diabetes mellitus without complications: Secondary | ICD-10-CM | POA: Diagnosis not present

## 2022-03-12 DIAGNOSIS — Z7952 Long term (current) use of systemic steroids: Secondary | ICD-10-CM | POA: Diagnosis not present

## 2022-03-12 DIAGNOSIS — I272 Pulmonary hypertension, unspecified: Secondary | ICD-10-CM | POA: Diagnosis not present

## 2022-03-12 DIAGNOSIS — M170 Bilateral primary osteoarthritis of knee: Secondary | ICD-10-CM | POA: Diagnosis not present

## 2022-03-12 DIAGNOSIS — N529 Male erectile dysfunction, unspecified: Secondary | ICD-10-CM | POA: Diagnosis not present

## 2022-03-12 DIAGNOSIS — M47812 Spondylosis without myelopathy or radiculopathy, cervical region: Secondary | ICD-10-CM | POA: Diagnosis not present

## 2022-03-12 DIAGNOSIS — Z8616 Personal history of COVID-19: Secondary | ICD-10-CM | POA: Diagnosis not present

## 2022-03-12 DIAGNOSIS — E039 Hypothyroidism, unspecified: Secondary | ICD-10-CM | POA: Diagnosis not present

## 2022-03-12 DIAGNOSIS — F0390 Unspecified dementia without behavioral disturbance: Secondary | ICD-10-CM | POA: Diagnosis not present

## 2022-03-12 DIAGNOSIS — Z9181 History of falling: Secondary | ICD-10-CM | POA: Diagnosis not present

## 2022-03-12 DIAGNOSIS — I5032 Chronic diastolic (congestive) heart failure: Secondary | ICD-10-CM | POA: Diagnosis not present

## 2022-03-12 DIAGNOSIS — D519 Vitamin B12 deficiency anemia, unspecified: Secondary | ICD-10-CM | POA: Diagnosis not present

## 2022-03-12 DIAGNOSIS — D509 Iron deficiency anemia, unspecified: Secondary | ICD-10-CM | POA: Diagnosis not present

## 2022-03-12 DIAGNOSIS — E785 Hyperlipidemia, unspecified: Secondary | ICD-10-CM | POA: Diagnosis not present

## 2022-03-14 DIAGNOSIS — J9601 Acute respiratory failure with hypoxia: Secondary | ICD-10-CM | POA: Diagnosis not present

## 2022-03-14 DIAGNOSIS — I272 Pulmonary hypertension, unspecified: Secondary | ICD-10-CM | POA: Diagnosis not present

## 2022-03-14 DIAGNOSIS — J449 Chronic obstructive pulmonary disease, unspecified: Secondary | ICD-10-CM | POA: Diagnosis not present

## 2022-03-14 DIAGNOSIS — I110 Hypertensive heart disease with heart failure: Secondary | ICD-10-CM | POA: Diagnosis not present

## 2022-03-14 DIAGNOSIS — E118 Type 2 diabetes mellitus with unspecified complications: Secondary | ICD-10-CM | POA: Diagnosis not present

## 2022-03-14 DIAGNOSIS — I5032 Chronic diastolic (congestive) heart failure: Secondary | ICD-10-CM | POA: Diagnosis not present

## 2022-03-14 DIAGNOSIS — F0390 Unspecified dementia without behavioral disturbance: Secondary | ICD-10-CM | POA: Diagnosis not present

## 2022-03-26 DIAGNOSIS — M25569 Pain in unspecified knee: Secondary | ICD-10-CM | POA: Diagnosis not present

## 2022-03-26 DIAGNOSIS — M6281 Muscle weakness (generalized): Secondary | ICD-10-CM | POA: Diagnosis not present

## 2022-03-26 DIAGNOSIS — R1312 Dysphagia, oropharyngeal phase: Secondary | ICD-10-CM | POA: Diagnosis not present

## 2022-03-26 DIAGNOSIS — R491 Aphonia: Secondary | ICD-10-CM | POA: Diagnosis not present

## 2022-03-26 DIAGNOSIS — Z723 Lack of physical exercise: Secondary | ICD-10-CM | POA: Diagnosis not present

## 2022-03-27 DIAGNOSIS — E785 Hyperlipidemia, unspecified: Secondary | ICD-10-CM | POA: Diagnosis not present

## 2022-03-27 DIAGNOSIS — E039 Hypothyroidism, unspecified: Secondary | ICD-10-CM | POA: Diagnosis not present

## 2022-03-27 DIAGNOSIS — I10 Essential (primary) hypertension: Secondary | ICD-10-CM | POA: Diagnosis not present

## 2022-03-27 DIAGNOSIS — E1129 Type 2 diabetes mellitus with other diabetic kidney complication: Secondary | ICD-10-CM | POA: Diagnosis not present

## 2022-03-28 DIAGNOSIS — R491 Aphonia: Secondary | ICD-10-CM | POA: Diagnosis not present

## 2022-03-28 DIAGNOSIS — M25569 Pain in unspecified knee: Secondary | ICD-10-CM | POA: Diagnosis not present

## 2022-03-28 DIAGNOSIS — M6281 Muscle weakness (generalized): Secondary | ICD-10-CM | POA: Diagnosis not present

## 2022-03-28 DIAGNOSIS — R1312 Dysphagia, oropharyngeal phase: Secondary | ICD-10-CM | POA: Diagnosis not present

## 2022-03-28 DIAGNOSIS — Z723 Lack of physical exercise: Secondary | ICD-10-CM | POA: Diagnosis not present

## 2022-03-29 DIAGNOSIS — R491 Aphonia: Secondary | ICD-10-CM | POA: Diagnosis not present

## 2022-03-29 DIAGNOSIS — Z723 Lack of physical exercise: Secondary | ICD-10-CM | POA: Diagnosis not present

## 2022-03-29 DIAGNOSIS — R1312 Dysphagia, oropharyngeal phase: Secondary | ICD-10-CM | POA: Diagnosis not present

## 2022-03-29 DIAGNOSIS — M6281 Muscle weakness (generalized): Secondary | ICD-10-CM | POA: Diagnosis not present

## 2022-03-29 DIAGNOSIS — M25569 Pain in unspecified knee: Secondary | ICD-10-CM | POA: Diagnosis not present

## 2022-04-02 DIAGNOSIS — M6281 Muscle weakness (generalized): Secondary | ICD-10-CM | POA: Diagnosis not present

## 2022-04-02 DIAGNOSIS — Z723 Lack of physical exercise: Secondary | ICD-10-CM | POA: Diagnosis not present

## 2022-04-02 DIAGNOSIS — M25569 Pain in unspecified knee: Secondary | ICD-10-CM | POA: Diagnosis not present

## 2022-04-02 DIAGNOSIS — R1312 Dysphagia, oropharyngeal phase: Secondary | ICD-10-CM | POA: Diagnosis not present

## 2022-04-02 DIAGNOSIS — R491 Aphonia: Secondary | ICD-10-CM | POA: Diagnosis not present

## 2022-04-03 DIAGNOSIS — M6281 Muscle weakness (generalized): Secondary | ICD-10-CM | POA: Diagnosis not present

## 2022-04-03 DIAGNOSIS — F331 Major depressive disorder, recurrent, moderate: Secondary | ICD-10-CM | POA: Diagnosis not present

## 2022-04-03 DIAGNOSIS — R491 Aphonia: Secondary | ICD-10-CM | POA: Diagnosis not present

## 2022-04-03 DIAGNOSIS — R1312 Dysphagia, oropharyngeal phase: Secondary | ICD-10-CM | POA: Diagnosis not present

## 2022-04-03 DIAGNOSIS — M25569 Pain in unspecified knee: Secondary | ICD-10-CM | POA: Diagnosis not present

## 2022-04-03 DIAGNOSIS — F5105 Insomnia due to other mental disorder: Secondary | ICD-10-CM | POA: Diagnosis not present

## 2022-04-03 DIAGNOSIS — Z723 Lack of physical exercise: Secondary | ICD-10-CM | POA: Diagnosis not present

## 2022-04-03 DIAGNOSIS — R441 Visual hallucinations: Secondary | ICD-10-CM | POA: Diagnosis not present

## 2022-04-03 DIAGNOSIS — G251 Drug-induced tremor: Secondary | ICD-10-CM | POA: Diagnosis not present

## 2022-04-03 DIAGNOSIS — G301 Alzheimer's disease with late onset: Secondary | ICD-10-CM | POA: Diagnosis not present

## 2022-04-04 DIAGNOSIS — M6281 Muscle weakness (generalized): Secondary | ICD-10-CM | POA: Diagnosis not present

## 2022-04-04 DIAGNOSIS — Z723 Lack of physical exercise: Secondary | ICD-10-CM | POA: Diagnosis not present

## 2022-04-04 DIAGNOSIS — R491 Aphonia: Secondary | ICD-10-CM | POA: Diagnosis not present

## 2022-04-04 DIAGNOSIS — E118 Type 2 diabetes mellitus with unspecified complications: Secondary | ICD-10-CM | POA: Diagnosis not present

## 2022-04-04 DIAGNOSIS — I130 Hypertensive heart and chronic kidney disease with heart failure and stage 1 through stage 4 chronic kidney disease, or unspecified chronic kidney disease: Secondary | ICD-10-CM | POA: Diagnosis not present

## 2022-04-04 DIAGNOSIS — R1312 Dysphagia, oropharyngeal phase: Secondary | ICD-10-CM | POA: Diagnosis not present

## 2022-04-04 DIAGNOSIS — L89890 Pressure ulcer of other site, unstageable: Secondary | ICD-10-CM | POA: Diagnosis not present

## 2022-04-04 DIAGNOSIS — M25569 Pain in unspecified knee: Secondary | ICD-10-CM | POA: Diagnosis not present

## 2022-04-05 DIAGNOSIS — M25569 Pain in unspecified knee: Secondary | ICD-10-CM | POA: Diagnosis not present

## 2022-04-05 DIAGNOSIS — R491 Aphonia: Secondary | ICD-10-CM | POA: Diagnosis not present

## 2022-04-05 DIAGNOSIS — Z723 Lack of physical exercise: Secondary | ICD-10-CM | POA: Diagnosis not present

## 2022-04-05 DIAGNOSIS — R1312 Dysphagia, oropharyngeal phase: Secondary | ICD-10-CM | POA: Diagnosis not present

## 2022-04-05 DIAGNOSIS — M6281 Muscle weakness (generalized): Secondary | ICD-10-CM | POA: Diagnosis not present

## 2022-04-07 DIAGNOSIS — R491 Aphonia: Secondary | ICD-10-CM | POA: Diagnosis not present

## 2022-04-07 DIAGNOSIS — R1312 Dysphagia, oropharyngeal phase: Secondary | ICD-10-CM | POA: Diagnosis not present

## 2022-04-07 DIAGNOSIS — M25569 Pain in unspecified knee: Secondary | ICD-10-CM | POA: Diagnosis not present

## 2022-04-07 DIAGNOSIS — M6281 Muscle weakness (generalized): Secondary | ICD-10-CM | POA: Diagnosis not present

## 2022-04-07 DIAGNOSIS — Z723 Lack of physical exercise: Secondary | ICD-10-CM | POA: Diagnosis not present

## 2022-04-09 DIAGNOSIS — Z723 Lack of physical exercise: Secondary | ICD-10-CM | POA: Diagnosis not present

## 2022-04-09 DIAGNOSIS — M6281 Muscle weakness (generalized): Secondary | ICD-10-CM | POA: Diagnosis not present

## 2022-04-09 DIAGNOSIS — M25569 Pain in unspecified knee: Secondary | ICD-10-CM | POA: Diagnosis not present

## 2022-04-09 DIAGNOSIS — R1312 Dysphagia, oropharyngeal phase: Secondary | ICD-10-CM | POA: Diagnosis not present

## 2022-04-09 DIAGNOSIS — R491 Aphonia: Secondary | ICD-10-CM | POA: Diagnosis not present

## 2022-04-11 DIAGNOSIS — M25569 Pain in unspecified knee: Secondary | ICD-10-CM | POA: Diagnosis not present

## 2022-04-11 DIAGNOSIS — M6281 Muscle weakness (generalized): Secondary | ICD-10-CM | POA: Diagnosis not present

## 2022-04-11 DIAGNOSIS — R1312 Dysphagia, oropharyngeal phase: Secondary | ICD-10-CM | POA: Diagnosis not present

## 2022-04-11 DIAGNOSIS — Z723 Lack of physical exercise: Secondary | ICD-10-CM | POA: Diagnosis not present

## 2022-04-11 DIAGNOSIS — R491 Aphonia: Secondary | ICD-10-CM | POA: Diagnosis not present

## 2022-04-12 DIAGNOSIS — M6281 Muscle weakness (generalized): Secondary | ICD-10-CM | POA: Diagnosis not present

## 2022-04-12 DIAGNOSIS — R1312 Dysphagia, oropharyngeal phase: Secondary | ICD-10-CM | POA: Diagnosis not present

## 2022-04-12 DIAGNOSIS — Z723 Lack of physical exercise: Secondary | ICD-10-CM | POA: Diagnosis not present

## 2022-04-12 DIAGNOSIS — R491 Aphonia: Secondary | ICD-10-CM | POA: Diagnosis not present

## 2022-04-12 DIAGNOSIS — M25569 Pain in unspecified knee: Secondary | ICD-10-CM | POA: Diagnosis not present

## 2022-04-13 DIAGNOSIS — R1312 Dysphagia, oropharyngeal phase: Secondary | ICD-10-CM | POA: Diagnosis not present

## 2022-04-13 DIAGNOSIS — M6281 Muscle weakness (generalized): Secondary | ICD-10-CM | POA: Diagnosis not present

## 2022-04-13 DIAGNOSIS — R491 Aphonia: Secondary | ICD-10-CM | POA: Diagnosis not present

## 2022-04-13 DIAGNOSIS — M25569 Pain in unspecified knee: Secondary | ICD-10-CM | POA: Diagnosis not present

## 2022-04-13 DIAGNOSIS — Z723 Lack of physical exercise: Secondary | ICD-10-CM | POA: Diagnosis not present

## 2022-04-16 DIAGNOSIS — R491 Aphonia: Secondary | ICD-10-CM | POA: Diagnosis not present

## 2022-04-16 DIAGNOSIS — R1312 Dysphagia, oropharyngeal phase: Secondary | ICD-10-CM | POA: Diagnosis not present

## 2022-04-16 DIAGNOSIS — Z723 Lack of physical exercise: Secondary | ICD-10-CM | POA: Diagnosis not present

## 2022-04-16 DIAGNOSIS — M25569 Pain in unspecified knee: Secondary | ICD-10-CM | POA: Diagnosis not present

## 2022-04-16 DIAGNOSIS — M6281 Muscle weakness (generalized): Secondary | ICD-10-CM | POA: Diagnosis not present

## 2022-04-17 DIAGNOSIS — M6281 Muscle weakness (generalized): Secondary | ICD-10-CM | POA: Diagnosis not present

## 2022-04-17 DIAGNOSIS — Z723 Lack of physical exercise: Secondary | ICD-10-CM | POA: Diagnosis not present

## 2022-04-17 DIAGNOSIS — M25569 Pain in unspecified knee: Secondary | ICD-10-CM | POA: Diagnosis not present

## 2022-04-17 DIAGNOSIS — R1312 Dysphagia, oropharyngeal phase: Secondary | ICD-10-CM | POA: Diagnosis not present

## 2022-04-17 DIAGNOSIS — R491 Aphonia: Secondary | ICD-10-CM | POA: Diagnosis not present

## 2022-04-18 ENCOUNTER — Other Ambulatory Visit: Payer: Self-pay

## 2022-04-18 DIAGNOSIS — R491 Aphonia: Secondary | ICD-10-CM | POA: Diagnosis not present

## 2022-04-18 DIAGNOSIS — M25569 Pain in unspecified knee: Secondary | ICD-10-CM | POA: Diagnosis not present

## 2022-04-18 DIAGNOSIS — M6281 Muscle weakness (generalized): Secondary | ICD-10-CM | POA: Diagnosis not present

## 2022-04-18 DIAGNOSIS — R1312 Dysphagia, oropharyngeal phase: Secondary | ICD-10-CM | POA: Diagnosis not present

## 2022-04-18 DIAGNOSIS — Z723 Lack of physical exercise: Secondary | ICD-10-CM | POA: Diagnosis not present

## 2022-04-19 ENCOUNTER — Ambulatory Visit: Payer: Medicare Other | Admitting: Cardiology

## 2022-04-19 DIAGNOSIS — M25569 Pain in unspecified knee: Secondary | ICD-10-CM | POA: Diagnosis not present

## 2022-04-19 DIAGNOSIS — M6281 Muscle weakness (generalized): Secondary | ICD-10-CM | POA: Diagnosis not present

## 2022-04-19 DIAGNOSIS — R1312 Dysphagia, oropharyngeal phase: Secondary | ICD-10-CM | POA: Diagnosis not present

## 2022-04-19 DIAGNOSIS — Z723 Lack of physical exercise: Secondary | ICD-10-CM | POA: Diagnosis not present

## 2022-04-19 DIAGNOSIS — R491 Aphonia: Secondary | ICD-10-CM | POA: Diagnosis not present

## 2022-04-20 DIAGNOSIS — R1312 Dysphagia, oropharyngeal phase: Secondary | ICD-10-CM | POA: Diagnosis not present

## 2022-04-20 DIAGNOSIS — Z723 Lack of physical exercise: Secondary | ICD-10-CM | POA: Diagnosis not present

## 2022-04-20 DIAGNOSIS — M25569 Pain in unspecified knee: Secondary | ICD-10-CM | POA: Diagnosis not present

## 2022-04-20 DIAGNOSIS — M6281 Muscle weakness (generalized): Secondary | ICD-10-CM | POA: Diagnosis not present

## 2022-04-20 DIAGNOSIS — R491 Aphonia: Secondary | ICD-10-CM | POA: Diagnosis not present

## 2022-04-23 ENCOUNTER — Other Ambulatory Visit: Payer: Self-pay | Admitting: Family Medicine

## 2022-04-24 DIAGNOSIS — M6281 Muscle weakness (generalized): Secondary | ICD-10-CM | POA: Diagnosis not present

## 2022-04-24 DIAGNOSIS — Z723 Lack of physical exercise: Secondary | ICD-10-CM | POA: Diagnosis not present

## 2022-04-24 DIAGNOSIS — R1312 Dysphagia, oropharyngeal phase: Secondary | ICD-10-CM | POA: Diagnosis not present

## 2022-04-24 DIAGNOSIS — M25569 Pain in unspecified knee: Secondary | ICD-10-CM | POA: Diagnosis not present

## 2022-04-24 DIAGNOSIS — R491 Aphonia: Secondary | ICD-10-CM | POA: Diagnosis not present

## 2022-04-25 DIAGNOSIS — S41101A Unspecified open wound of right upper arm, initial encounter: Secondary | ICD-10-CM | POA: Diagnosis not present

## 2022-04-25 DIAGNOSIS — R1312 Dysphagia, oropharyngeal phase: Secondary | ICD-10-CM | POA: Diagnosis not present

## 2022-04-25 DIAGNOSIS — M6281 Muscle weakness (generalized): Secondary | ICD-10-CM | POA: Diagnosis not present

## 2022-04-25 DIAGNOSIS — E118 Type 2 diabetes mellitus with unspecified complications: Secondary | ICD-10-CM | POA: Diagnosis not present

## 2022-04-25 DIAGNOSIS — R491 Aphonia: Secondary | ICD-10-CM | POA: Diagnosis not present

## 2022-04-25 DIAGNOSIS — Z723 Lack of physical exercise: Secondary | ICD-10-CM | POA: Diagnosis not present

## 2022-04-25 DIAGNOSIS — M25569 Pain in unspecified knee: Secondary | ICD-10-CM | POA: Diagnosis not present

## 2022-04-26 DIAGNOSIS — Z9181 History of falling: Secondary | ICD-10-CM | POA: Diagnosis not present

## 2022-04-26 DIAGNOSIS — B0230 Zoster ocular disease, unspecified: Secondary | ICD-10-CM | POA: Diagnosis not present

## 2022-04-26 DIAGNOSIS — Z6830 Body mass index (BMI) 30.0-30.9, adult: Secondary | ICD-10-CM | POA: Diagnosis not present

## 2022-04-26 DIAGNOSIS — I5032 Chronic diastolic (congestive) heart failure: Secondary | ICD-10-CM | POA: Diagnosis not present

## 2022-04-26 DIAGNOSIS — M47812 Spondylosis without myelopathy or radiculopathy, cervical region: Secondary | ICD-10-CM | POA: Diagnosis not present

## 2022-04-26 DIAGNOSIS — E039 Hypothyroidism, unspecified: Secondary | ICD-10-CM | POA: Diagnosis not present

## 2022-04-26 DIAGNOSIS — Z8673 Personal history of transient ischemic attack (TIA), and cerebral infarction without residual deficits: Secondary | ICD-10-CM | POA: Diagnosis not present

## 2022-04-26 DIAGNOSIS — M1990 Unspecified osteoarthritis, unspecified site: Secondary | ICD-10-CM | POA: Diagnosis not present

## 2022-04-26 DIAGNOSIS — E785 Hyperlipidemia, unspecified: Secondary | ICD-10-CM | POA: Diagnosis not present

## 2022-04-26 DIAGNOSIS — N400 Enlarged prostate without lower urinary tract symptoms: Secondary | ICD-10-CM | POA: Diagnosis not present

## 2022-04-26 DIAGNOSIS — G252 Other specified forms of tremor: Secondary | ICD-10-CM | POA: Diagnosis not present

## 2022-04-26 DIAGNOSIS — S51801D Unspecified open wound of right forearm, subsequent encounter: Secondary | ICD-10-CM | POA: Diagnosis not present

## 2022-04-26 DIAGNOSIS — E1165 Type 2 diabetes mellitus with hyperglycemia: Secondary | ICD-10-CM | POA: Diagnosis not present

## 2022-04-26 DIAGNOSIS — G4700 Insomnia, unspecified: Secondary | ICD-10-CM | POA: Diagnosis not present

## 2022-04-26 DIAGNOSIS — I110 Hypertensive heart disease with heart failure: Secondary | ICD-10-CM | POA: Diagnosis not present

## 2022-04-26 DIAGNOSIS — J9611 Chronic respiratory failure with hypoxia: Secondary | ICD-10-CM | POA: Diagnosis not present

## 2022-04-26 DIAGNOSIS — F03918 Unspecified dementia, unspecified severity, with other behavioral disturbance: Secondary | ICD-10-CM | POA: Diagnosis not present

## 2022-04-26 DIAGNOSIS — K219 Gastro-esophageal reflux disease without esophagitis: Secondary | ICD-10-CM | POA: Diagnosis not present

## 2022-04-26 DIAGNOSIS — Z8616 Personal history of COVID-19: Secondary | ICD-10-CM | POA: Diagnosis not present

## 2022-04-26 DIAGNOSIS — E669 Obesity, unspecified: Secondary | ICD-10-CM | POA: Diagnosis not present

## 2022-04-26 DIAGNOSIS — N529 Male erectile dysfunction, unspecified: Secondary | ICD-10-CM | POA: Diagnosis not present

## 2022-04-26 DIAGNOSIS — J449 Chronic obstructive pulmonary disease, unspecified: Secondary | ICD-10-CM | POA: Diagnosis not present

## 2022-04-26 DIAGNOSIS — D509 Iron deficiency anemia, unspecified: Secondary | ICD-10-CM | POA: Diagnosis not present

## 2022-04-26 DIAGNOSIS — I2510 Atherosclerotic heart disease of native coronary artery without angina pectoris: Secondary | ICD-10-CM | POA: Diagnosis not present

## 2022-04-26 DIAGNOSIS — J988 Other specified respiratory disorders: Secondary | ICD-10-CM | POA: Diagnosis not present

## 2022-04-27 DIAGNOSIS — F03918 Unspecified dementia, unspecified severity, with other behavioral disturbance: Secondary | ICD-10-CM | POA: Diagnosis not present

## 2022-04-27 DIAGNOSIS — G4700 Insomnia, unspecified: Secondary | ICD-10-CM | POA: Diagnosis not present

## 2022-04-27 DIAGNOSIS — I110 Hypertensive heart disease with heart failure: Secondary | ICD-10-CM | POA: Diagnosis not present

## 2022-04-27 DIAGNOSIS — S51801D Unspecified open wound of right forearm, subsequent encounter: Secondary | ICD-10-CM | POA: Diagnosis not present

## 2022-04-27 DIAGNOSIS — E1165 Type 2 diabetes mellitus with hyperglycemia: Secondary | ICD-10-CM | POA: Diagnosis not present

## 2022-04-27 DIAGNOSIS — J449 Chronic obstructive pulmonary disease, unspecified: Secondary | ICD-10-CM | POA: Diagnosis not present

## 2022-05-01 DIAGNOSIS — F03918 Unspecified dementia, unspecified severity, with other behavioral disturbance: Secondary | ICD-10-CM | POA: Diagnosis not present

## 2022-05-01 DIAGNOSIS — F5105 Insomnia due to other mental disorder: Secondary | ICD-10-CM | POA: Diagnosis not present

## 2022-05-01 DIAGNOSIS — E1165 Type 2 diabetes mellitus with hyperglycemia: Secondary | ICD-10-CM | POA: Diagnosis not present

## 2022-05-01 DIAGNOSIS — G251 Drug-induced tremor: Secondary | ICD-10-CM | POA: Diagnosis not present

## 2022-05-01 DIAGNOSIS — R441 Visual hallucinations: Secondary | ICD-10-CM | POA: Diagnosis not present

## 2022-05-01 DIAGNOSIS — I110 Hypertensive heart disease with heart failure: Secondary | ICD-10-CM | POA: Diagnosis not present

## 2022-05-01 DIAGNOSIS — J449 Chronic obstructive pulmonary disease, unspecified: Secondary | ICD-10-CM | POA: Diagnosis not present

## 2022-05-01 DIAGNOSIS — F331 Major depressive disorder, recurrent, moderate: Secondary | ICD-10-CM | POA: Diagnosis not present

## 2022-05-01 DIAGNOSIS — G301 Alzheimer's disease with late onset: Secondary | ICD-10-CM | POA: Diagnosis not present

## 2022-05-01 DIAGNOSIS — G4700 Insomnia, unspecified: Secondary | ICD-10-CM | POA: Diagnosis not present

## 2022-05-01 DIAGNOSIS — S51801D Unspecified open wound of right forearm, subsequent encounter: Secondary | ICD-10-CM | POA: Diagnosis not present

## 2022-05-02 DIAGNOSIS — I110 Hypertensive heart disease with heart failure: Secondary | ICD-10-CM | POA: Diagnosis not present

## 2022-05-02 DIAGNOSIS — G4700 Insomnia, unspecified: Secondary | ICD-10-CM | POA: Diagnosis not present

## 2022-05-02 DIAGNOSIS — F03918 Unspecified dementia, unspecified severity, with other behavioral disturbance: Secondary | ICD-10-CM | POA: Diagnosis not present

## 2022-05-02 DIAGNOSIS — E1165 Type 2 diabetes mellitus with hyperglycemia: Secondary | ICD-10-CM | POA: Diagnosis not present

## 2022-05-02 DIAGNOSIS — S51801D Unspecified open wound of right forearm, subsequent encounter: Secondary | ICD-10-CM | POA: Diagnosis not present

## 2022-05-02 DIAGNOSIS — J449 Chronic obstructive pulmonary disease, unspecified: Secondary | ICD-10-CM | POA: Diagnosis not present

## 2022-05-03 DIAGNOSIS — B351 Tinea unguium: Secondary | ICD-10-CM | POA: Diagnosis not present

## 2022-05-03 DIAGNOSIS — E1165 Type 2 diabetes mellitus with hyperglycemia: Secondary | ICD-10-CM | POA: Diagnosis not present

## 2022-05-03 DIAGNOSIS — I110 Hypertensive heart disease with heart failure: Secondary | ICD-10-CM | POA: Diagnosis not present

## 2022-05-03 DIAGNOSIS — M79675 Pain in left toe(s): Secondary | ICD-10-CM | POA: Diagnosis not present

## 2022-05-03 DIAGNOSIS — J449 Chronic obstructive pulmonary disease, unspecified: Secondary | ICD-10-CM | POA: Diagnosis not present

## 2022-05-03 DIAGNOSIS — G4700 Insomnia, unspecified: Secondary | ICD-10-CM | POA: Diagnosis not present

## 2022-05-03 DIAGNOSIS — F03918 Unspecified dementia, unspecified severity, with other behavioral disturbance: Secondary | ICD-10-CM | POA: Diagnosis not present

## 2022-05-03 DIAGNOSIS — M79674 Pain in right toe(s): Secondary | ICD-10-CM | POA: Diagnosis not present

## 2022-05-03 DIAGNOSIS — S51801D Unspecified open wound of right forearm, subsequent encounter: Secondary | ICD-10-CM | POA: Diagnosis not present

## 2022-05-07 DIAGNOSIS — G4700 Insomnia, unspecified: Secondary | ICD-10-CM | POA: Diagnosis not present

## 2022-05-07 DIAGNOSIS — I110 Hypertensive heart disease with heart failure: Secondary | ICD-10-CM | POA: Diagnosis not present

## 2022-05-07 DIAGNOSIS — S51801D Unspecified open wound of right forearm, subsequent encounter: Secondary | ICD-10-CM | POA: Diagnosis not present

## 2022-05-07 DIAGNOSIS — E1165 Type 2 diabetes mellitus with hyperglycemia: Secondary | ICD-10-CM | POA: Diagnosis not present

## 2022-05-07 DIAGNOSIS — J449 Chronic obstructive pulmonary disease, unspecified: Secondary | ICD-10-CM | POA: Diagnosis not present

## 2022-05-07 DIAGNOSIS — F03918 Unspecified dementia, unspecified severity, with other behavioral disturbance: Secondary | ICD-10-CM | POA: Diagnosis not present

## 2022-05-10 DIAGNOSIS — S51801D Unspecified open wound of right forearm, subsequent encounter: Secondary | ICD-10-CM | POA: Diagnosis not present

## 2022-05-10 DIAGNOSIS — I110 Hypertensive heart disease with heart failure: Secondary | ICD-10-CM | POA: Diagnosis not present

## 2022-05-10 DIAGNOSIS — J449 Chronic obstructive pulmonary disease, unspecified: Secondary | ICD-10-CM | POA: Diagnosis not present

## 2022-05-10 DIAGNOSIS — F03918 Unspecified dementia, unspecified severity, with other behavioral disturbance: Secondary | ICD-10-CM | POA: Diagnosis not present

## 2022-05-10 DIAGNOSIS — E1165 Type 2 diabetes mellitus with hyperglycemia: Secondary | ICD-10-CM | POA: Diagnosis not present

## 2022-05-10 DIAGNOSIS — G4700 Insomnia, unspecified: Secondary | ICD-10-CM | POA: Diagnosis not present

## 2022-05-14 DIAGNOSIS — S51801D Unspecified open wound of right forearm, subsequent encounter: Secondary | ICD-10-CM | POA: Diagnosis not present

## 2022-05-14 DIAGNOSIS — E1165 Type 2 diabetes mellitus with hyperglycemia: Secondary | ICD-10-CM | POA: Diagnosis not present

## 2022-05-14 DIAGNOSIS — J449 Chronic obstructive pulmonary disease, unspecified: Secondary | ICD-10-CM | POA: Diagnosis not present

## 2022-05-14 DIAGNOSIS — F03918 Unspecified dementia, unspecified severity, with other behavioral disturbance: Secondary | ICD-10-CM | POA: Diagnosis not present

## 2022-05-14 DIAGNOSIS — I110 Hypertensive heart disease with heart failure: Secondary | ICD-10-CM | POA: Diagnosis not present

## 2022-05-14 DIAGNOSIS — G4700 Insomnia, unspecified: Secondary | ICD-10-CM | POA: Diagnosis not present

## 2022-05-15 DIAGNOSIS — N390 Urinary tract infection, site not specified: Secondary | ICD-10-CM | POA: Diagnosis not present

## 2022-05-16 DIAGNOSIS — G4700 Insomnia, unspecified: Secondary | ICD-10-CM | POA: Diagnosis not present

## 2022-05-16 DIAGNOSIS — E1165 Type 2 diabetes mellitus with hyperglycemia: Secondary | ICD-10-CM | POA: Diagnosis not present

## 2022-05-16 DIAGNOSIS — I110 Hypertensive heart disease with heart failure: Secondary | ICD-10-CM | POA: Diagnosis not present

## 2022-05-16 DIAGNOSIS — S51801D Unspecified open wound of right forearm, subsequent encounter: Secondary | ICD-10-CM | POA: Diagnosis not present

## 2022-05-16 DIAGNOSIS — J449 Chronic obstructive pulmonary disease, unspecified: Secondary | ICD-10-CM | POA: Diagnosis not present

## 2022-05-16 DIAGNOSIS — F39 Unspecified mood [affective] disorder: Secondary | ICD-10-CM | POA: Diagnosis not present

## 2022-05-16 DIAGNOSIS — F0283 Dementia in other diseases classified elsewhere, unspecified severity, with mood disturbance: Secondary | ICD-10-CM | POA: Diagnosis not present

## 2022-05-16 DIAGNOSIS — F03918 Unspecified dementia, unspecified severity, with other behavioral disturbance: Secondary | ICD-10-CM | POA: Diagnosis not present

## 2022-05-23 DIAGNOSIS — J449 Chronic obstructive pulmonary disease, unspecified: Secondary | ICD-10-CM | POA: Diagnosis not present

## 2022-05-23 DIAGNOSIS — G4700 Insomnia, unspecified: Secondary | ICD-10-CM | POA: Diagnosis not present

## 2022-05-23 DIAGNOSIS — F0283 Dementia in other diseases classified elsewhere, unspecified severity, with mood disturbance: Secondary | ICD-10-CM | POA: Diagnosis not present

## 2022-05-23 DIAGNOSIS — I110 Hypertensive heart disease with heart failure: Secondary | ICD-10-CM | POA: Diagnosis not present

## 2022-05-23 DIAGNOSIS — S51801D Unspecified open wound of right forearm, subsequent encounter: Secondary | ICD-10-CM | POA: Diagnosis not present

## 2022-05-23 DIAGNOSIS — J9611 Chronic respiratory failure with hypoxia: Secondary | ICD-10-CM | POA: Diagnosis not present

## 2022-05-23 DIAGNOSIS — E1165 Type 2 diabetes mellitus with hyperglycemia: Secondary | ICD-10-CM | POA: Diagnosis not present

## 2022-05-23 DIAGNOSIS — F03918 Unspecified dementia, unspecified severity, with other behavioral disturbance: Secondary | ICD-10-CM | POA: Diagnosis not present

## 2022-05-23 DIAGNOSIS — U071 COVID-19: Secondary | ICD-10-CM | POA: Diagnosis not present

## 2022-05-26 DIAGNOSIS — E1165 Type 2 diabetes mellitus with hyperglycemia: Secondary | ICD-10-CM | POA: Diagnosis not present

## 2022-05-26 DIAGNOSIS — E669 Obesity, unspecified: Secondary | ICD-10-CM | POA: Diagnosis not present

## 2022-05-26 DIAGNOSIS — G252 Other specified forms of tremor: Secondary | ICD-10-CM | POA: Diagnosis not present

## 2022-05-26 DIAGNOSIS — G4700 Insomnia, unspecified: Secondary | ICD-10-CM | POA: Diagnosis not present

## 2022-05-26 DIAGNOSIS — F03918 Unspecified dementia, unspecified severity, with other behavioral disturbance: Secondary | ICD-10-CM | POA: Diagnosis not present

## 2022-05-26 DIAGNOSIS — Z8616 Personal history of COVID-19: Secondary | ICD-10-CM | POA: Diagnosis not present

## 2022-05-26 DIAGNOSIS — Z8673 Personal history of transient ischemic attack (TIA), and cerebral infarction without residual deficits: Secondary | ICD-10-CM | POA: Diagnosis not present

## 2022-05-26 DIAGNOSIS — N529 Male erectile dysfunction, unspecified: Secondary | ICD-10-CM | POA: Diagnosis not present

## 2022-05-26 DIAGNOSIS — M1990 Unspecified osteoarthritis, unspecified site: Secondary | ICD-10-CM | POA: Diagnosis not present

## 2022-05-26 DIAGNOSIS — Z9181 History of falling: Secondary | ICD-10-CM | POA: Diagnosis not present

## 2022-05-26 DIAGNOSIS — K219 Gastro-esophageal reflux disease without esophagitis: Secondary | ICD-10-CM | POA: Diagnosis not present

## 2022-05-26 DIAGNOSIS — J9611 Chronic respiratory failure with hypoxia: Secondary | ICD-10-CM | POA: Diagnosis not present

## 2022-05-26 DIAGNOSIS — J988 Other specified respiratory disorders: Secondary | ICD-10-CM | POA: Diagnosis not present

## 2022-05-26 DIAGNOSIS — I2510 Atherosclerotic heart disease of native coronary artery without angina pectoris: Secondary | ICD-10-CM | POA: Diagnosis not present

## 2022-05-26 DIAGNOSIS — M47812 Spondylosis without myelopathy or radiculopathy, cervical region: Secondary | ICD-10-CM | POA: Diagnosis not present

## 2022-05-26 DIAGNOSIS — J449 Chronic obstructive pulmonary disease, unspecified: Secondary | ICD-10-CM | POA: Diagnosis not present

## 2022-05-26 DIAGNOSIS — I5032 Chronic diastolic (congestive) heart failure: Secondary | ICD-10-CM | POA: Diagnosis not present

## 2022-05-26 DIAGNOSIS — N400 Enlarged prostate without lower urinary tract symptoms: Secondary | ICD-10-CM | POA: Diagnosis not present

## 2022-05-26 DIAGNOSIS — B0230 Zoster ocular disease, unspecified: Secondary | ICD-10-CM | POA: Diagnosis not present

## 2022-05-26 DIAGNOSIS — S51801D Unspecified open wound of right forearm, subsequent encounter: Secondary | ICD-10-CM | POA: Diagnosis not present

## 2022-05-26 DIAGNOSIS — E785 Hyperlipidemia, unspecified: Secondary | ICD-10-CM | POA: Diagnosis not present

## 2022-05-26 DIAGNOSIS — Z6830 Body mass index (BMI) 30.0-30.9, adult: Secondary | ICD-10-CM | POA: Diagnosis not present

## 2022-05-26 DIAGNOSIS — D509 Iron deficiency anemia, unspecified: Secondary | ICD-10-CM | POA: Diagnosis not present

## 2022-05-26 DIAGNOSIS — E039 Hypothyroidism, unspecified: Secondary | ICD-10-CM | POA: Diagnosis not present

## 2022-05-26 DIAGNOSIS — I110 Hypertensive heart disease with heart failure: Secondary | ICD-10-CM | POA: Diagnosis not present

## 2022-05-28 DIAGNOSIS — F03918 Unspecified dementia, unspecified severity, with other behavioral disturbance: Secondary | ICD-10-CM | POA: Diagnosis not present

## 2022-05-28 DIAGNOSIS — G4700 Insomnia, unspecified: Secondary | ICD-10-CM | POA: Diagnosis not present

## 2022-05-28 DIAGNOSIS — S51801D Unspecified open wound of right forearm, subsequent encounter: Secondary | ICD-10-CM | POA: Diagnosis not present

## 2022-05-28 DIAGNOSIS — I110 Hypertensive heart disease with heart failure: Secondary | ICD-10-CM | POA: Diagnosis not present

## 2022-05-28 DIAGNOSIS — J449 Chronic obstructive pulmonary disease, unspecified: Secondary | ICD-10-CM | POA: Diagnosis not present

## 2022-05-28 DIAGNOSIS — E1165 Type 2 diabetes mellitus with hyperglycemia: Secondary | ICD-10-CM | POA: Diagnosis not present

## 2022-05-30 DIAGNOSIS — F03918 Unspecified dementia, unspecified severity, with other behavioral disturbance: Secondary | ICD-10-CM | POA: Diagnosis not present

## 2022-05-30 DIAGNOSIS — S51801D Unspecified open wound of right forearm, subsequent encounter: Secondary | ICD-10-CM | POA: Diagnosis not present

## 2022-05-30 DIAGNOSIS — E1165 Type 2 diabetes mellitus with hyperglycemia: Secondary | ICD-10-CM | POA: Diagnosis not present

## 2022-05-30 DIAGNOSIS — I110 Hypertensive heart disease with heart failure: Secondary | ICD-10-CM | POA: Diagnosis not present

## 2022-05-30 DIAGNOSIS — G4700 Insomnia, unspecified: Secondary | ICD-10-CM | POA: Diagnosis not present

## 2022-05-30 DIAGNOSIS — J449 Chronic obstructive pulmonary disease, unspecified: Secondary | ICD-10-CM | POA: Diagnosis not present

## 2022-06-05 DIAGNOSIS — E1165 Type 2 diabetes mellitus with hyperglycemia: Secondary | ICD-10-CM | POA: Diagnosis not present

## 2022-06-05 DIAGNOSIS — G4700 Insomnia, unspecified: Secondary | ICD-10-CM | POA: Diagnosis not present

## 2022-06-05 DIAGNOSIS — J449 Chronic obstructive pulmonary disease, unspecified: Secondary | ICD-10-CM | POA: Diagnosis not present

## 2022-06-05 DIAGNOSIS — I110 Hypertensive heart disease with heart failure: Secondary | ICD-10-CM | POA: Diagnosis not present

## 2022-06-05 DIAGNOSIS — F03918 Unspecified dementia, unspecified severity, with other behavioral disturbance: Secondary | ICD-10-CM | POA: Diagnosis not present

## 2022-06-05 DIAGNOSIS — S51801D Unspecified open wound of right forearm, subsequent encounter: Secondary | ICD-10-CM | POA: Diagnosis not present

## 2022-06-06 DIAGNOSIS — E785 Hyperlipidemia, unspecified: Secondary | ICD-10-CM | POA: Diagnosis not present

## 2022-06-06 DIAGNOSIS — Z79899 Other long term (current) drug therapy: Secondary | ICD-10-CM | POA: Diagnosis not present

## 2022-06-06 DIAGNOSIS — J449 Chronic obstructive pulmonary disease, unspecified: Secondary | ICD-10-CM | POA: Diagnosis not present

## 2022-06-07 DIAGNOSIS — E785 Hyperlipidemia, unspecified: Secondary | ICD-10-CM | POA: Diagnosis not present

## 2022-06-12 DIAGNOSIS — F5105 Insomnia due to other mental disorder: Secondary | ICD-10-CM | POA: Diagnosis not present

## 2022-06-12 DIAGNOSIS — R441 Visual hallucinations: Secondary | ICD-10-CM | POA: Diagnosis not present

## 2022-06-12 DIAGNOSIS — G251 Drug-induced tremor: Secondary | ICD-10-CM | POA: Diagnosis not present

## 2022-06-12 DIAGNOSIS — G301 Alzheimer's disease with late onset: Secondary | ICD-10-CM | POA: Diagnosis not present

## 2022-06-12 DIAGNOSIS — F331 Major depressive disorder, recurrent, moderate: Secondary | ICD-10-CM | POA: Diagnosis not present

## 2022-06-27 ENCOUNTER — Ambulatory Visit: Payer: Medicare Other | Admitting: Cardiology

## 2022-07-02 DIAGNOSIS — R499 Unspecified voice and resonance disorder: Secondary | ICD-10-CM | POA: Diagnosis not present

## 2022-07-02 DIAGNOSIS — R41841 Cognitive communication deficit: Secondary | ICD-10-CM | POA: Diagnosis not present

## 2022-07-04 DIAGNOSIS — R499 Unspecified voice and resonance disorder: Secondary | ICD-10-CM | POA: Diagnosis not present

## 2022-07-04 DIAGNOSIS — R41841 Cognitive communication deficit: Secondary | ICD-10-CM | POA: Diagnosis not present

## 2022-07-09 DIAGNOSIS — R41841 Cognitive communication deficit: Secondary | ICD-10-CM | POA: Diagnosis not present

## 2022-07-09 DIAGNOSIS — R499 Unspecified voice and resonance disorder: Secondary | ICD-10-CM | POA: Diagnosis not present

## 2022-07-10 DIAGNOSIS — G251 Drug-induced tremor: Secondary | ICD-10-CM | POA: Diagnosis not present

## 2022-07-10 DIAGNOSIS — F331 Major depressive disorder, recurrent, moderate: Secondary | ICD-10-CM | POA: Diagnosis not present

## 2022-07-10 DIAGNOSIS — F5105 Insomnia due to other mental disorder: Secondary | ICD-10-CM | POA: Diagnosis not present

## 2022-07-10 DIAGNOSIS — R441 Visual hallucinations: Secondary | ICD-10-CM | POA: Diagnosis not present

## 2022-07-10 DIAGNOSIS — G301 Alzheimer's disease with late onset: Secondary | ICD-10-CM | POA: Diagnosis not present

## 2022-07-11 DIAGNOSIS — R41841 Cognitive communication deficit: Secondary | ICD-10-CM | POA: Diagnosis not present

## 2022-07-11 DIAGNOSIS — R499 Unspecified voice and resonance disorder: Secondary | ICD-10-CM | POA: Diagnosis not present

## 2022-07-12 DIAGNOSIS — R41841 Cognitive communication deficit: Secondary | ICD-10-CM | POA: Diagnosis not present

## 2022-07-12 DIAGNOSIS — R499 Unspecified voice and resonance disorder: Secondary | ICD-10-CM | POA: Diagnosis not present

## 2022-07-18 DIAGNOSIS — R499 Unspecified voice and resonance disorder: Secondary | ICD-10-CM | POA: Diagnosis not present

## 2022-07-18 DIAGNOSIS — R41841 Cognitive communication deficit: Secondary | ICD-10-CM | POA: Diagnosis not present

## 2022-07-19 DIAGNOSIS — R499 Unspecified voice and resonance disorder: Secondary | ICD-10-CM | POA: Diagnosis not present

## 2022-07-19 DIAGNOSIS — R41841 Cognitive communication deficit: Secondary | ICD-10-CM | POA: Diagnosis not present

## 2022-07-20 DIAGNOSIS — R499 Unspecified voice and resonance disorder: Secondary | ICD-10-CM | POA: Diagnosis not present

## 2022-07-20 DIAGNOSIS — R41841 Cognitive communication deficit: Secondary | ICD-10-CM | POA: Diagnosis not present

## 2022-07-23 ENCOUNTER — Ambulatory Visit: Payer: Medicare Other | Admitting: Cardiology

## 2022-07-23 DIAGNOSIS — R41841 Cognitive communication deficit: Secondary | ICD-10-CM | POA: Diagnosis not present

## 2022-07-23 DIAGNOSIS — R499 Unspecified voice and resonance disorder: Secondary | ICD-10-CM | POA: Diagnosis not present

## 2022-07-26 DIAGNOSIS — M79675 Pain in left toe(s): Secondary | ICD-10-CM | POA: Diagnosis not present

## 2022-07-26 DIAGNOSIS — M79674 Pain in right toe(s): Secondary | ICD-10-CM | POA: Diagnosis not present

## 2022-07-26 DIAGNOSIS — B351 Tinea unguium: Secondary | ICD-10-CM | POA: Diagnosis not present

## 2022-07-27 DIAGNOSIS — R41841 Cognitive communication deficit: Secondary | ICD-10-CM | POA: Diagnosis not present

## 2022-07-27 DIAGNOSIS — R499 Unspecified voice and resonance disorder: Secondary | ICD-10-CM | POA: Diagnosis not present

## 2022-07-30 DIAGNOSIS — R41841 Cognitive communication deficit: Secondary | ICD-10-CM | POA: Diagnosis not present

## 2022-07-30 DIAGNOSIS — R499 Unspecified voice and resonance disorder: Secondary | ICD-10-CM | POA: Diagnosis not present

## 2022-08-01 DIAGNOSIS — R41841 Cognitive communication deficit: Secondary | ICD-10-CM | POA: Diagnosis not present

## 2022-08-01 DIAGNOSIS — R499 Unspecified voice and resonance disorder: Secondary | ICD-10-CM | POA: Diagnosis not present

## 2022-08-03 DIAGNOSIS — R41841 Cognitive communication deficit: Secondary | ICD-10-CM | POA: Diagnosis not present

## 2022-08-03 DIAGNOSIS — R499 Unspecified voice and resonance disorder: Secondary | ICD-10-CM | POA: Diagnosis not present

## 2022-08-06 DIAGNOSIS — R499 Unspecified voice and resonance disorder: Secondary | ICD-10-CM | POA: Diagnosis not present

## 2022-08-06 DIAGNOSIS — R41841 Cognitive communication deficit: Secondary | ICD-10-CM | POA: Diagnosis not present

## 2022-08-07 DIAGNOSIS — G301 Alzheimer's disease with late onset: Secondary | ICD-10-CM | POA: Diagnosis not present

## 2022-08-07 DIAGNOSIS — R441 Visual hallucinations: Secondary | ICD-10-CM | POA: Diagnosis not present

## 2022-08-07 DIAGNOSIS — F331 Major depressive disorder, recurrent, moderate: Secondary | ICD-10-CM | POA: Diagnosis not present

## 2022-08-07 DIAGNOSIS — F5105 Insomnia due to other mental disorder: Secondary | ICD-10-CM | POA: Diagnosis not present

## 2022-08-07 DIAGNOSIS — G251 Drug-induced tremor: Secondary | ICD-10-CM | POA: Diagnosis not present

## 2022-08-08 DIAGNOSIS — R41841 Cognitive communication deficit: Secondary | ICD-10-CM | POA: Diagnosis not present

## 2022-08-08 DIAGNOSIS — R499 Unspecified voice and resonance disorder: Secondary | ICD-10-CM | POA: Diagnosis not present

## 2022-08-09 DIAGNOSIS — J984 Other disorders of lung: Secondary | ICD-10-CM | POA: Diagnosis not present

## 2022-08-10 DIAGNOSIS — R499 Unspecified voice and resonance disorder: Secondary | ICD-10-CM | POA: Diagnosis not present

## 2022-08-10 DIAGNOSIS — R41841 Cognitive communication deficit: Secondary | ICD-10-CM | POA: Diagnosis not present

## 2022-08-13 DIAGNOSIS — R41841 Cognitive communication deficit: Secondary | ICD-10-CM | POA: Diagnosis not present

## 2022-08-13 DIAGNOSIS — R499 Unspecified voice and resonance disorder: Secondary | ICD-10-CM | POA: Diagnosis not present

## 2022-08-14 DIAGNOSIS — R499 Unspecified voice and resonance disorder: Secondary | ICD-10-CM | POA: Diagnosis not present

## 2022-08-14 DIAGNOSIS — R41841 Cognitive communication deficit: Secondary | ICD-10-CM | POA: Diagnosis not present

## 2022-08-17 DIAGNOSIS — R41841 Cognitive communication deficit: Secondary | ICD-10-CM | POA: Diagnosis not present

## 2022-08-17 DIAGNOSIS — R499 Unspecified voice and resonance disorder: Secondary | ICD-10-CM | POA: Diagnosis not present

## 2022-08-20 DIAGNOSIS — R499 Unspecified voice and resonance disorder: Secondary | ICD-10-CM | POA: Diagnosis not present

## 2022-08-20 DIAGNOSIS — R41841 Cognitive communication deficit: Secondary | ICD-10-CM | POA: Diagnosis not present

## 2022-08-22 DIAGNOSIS — R41841 Cognitive communication deficit: Secondary | ICD-10-CM | POA: Diagnosis not present

## 2022-08-22 DIAGNOSIS — R499 Unspecified voice and resonance disorder: Secondary | ICD-10-CM | POA: Diagnosis not present

## 2022-08-23 ENCOUNTER — Encounter: Payer: Self-pay | Admitting: Cardiology

## 2022-08-23 ENCOUNTER — Ambulatory Visit: Payer: Medicare Other | Attending: Cardiology | Admitting: Cardiology

## 2022-08-23 VITALS — BP 130/56 | HR 76 | Ht 68.0 in | Wt 191.0 lb

## 2022-08-23 DIAGNOSIS — I2510 Atherosclerotic heart disease of native coronary artery without angina pectoris: Secondary | ICD-10-CM | POA: Diagnosis not present

## 2022-08-23 DIAGNOSIS — I7111 Aneurysm of the ascending aorta, ruptured: Secondary | ICD-10-CM | POA: Insufficient documentation

## 2022-08-23 DIAGNOSIS — E785 Hyperlipidemia, unspecified: Secondary | ICD-10-CM | POA: Insufficient documentation

## 2022-08-23 DIAGNOSIS — I10 Essential (primary) hypertension: Secondary | ICD-10-CM | POA: Insufficient documentation

## 2022-08-23 DIAGNOSIS — R41841 Cognitive communication deficit: Secondary | ICD-10-CM | POA: Diagnosis not present

## 2022-08-23 DIAGNOSIS — R499 Unspecified voice and resonance disorder: Secondary | ICD-10-CM | POA: Diagnosis not present

## 2022-08-23 NOTE — Progress Notes (Signed)
Cardiology Office Note:    Date:  08/23/2022   ID:  Douglas Grant, DOB [birth year], MRN [id number]  PCP:  Douglas Pal, DO  Cardiologist:  Douglas Lindau, MD   Referring MD: Douglas Grant*    ASSESSMENT:    1. Essential hypertension   2. Hyperlipidemia LDL goal <100   3. Ruptured aneurysm of ascending aorta ([place])   4. Atherosclerosis of native coronary artery of native heart without angina pectoris    PLAN:    In order of problems listed above:  Coronary artery disease: Secondary prevention stressed with the patient.  Importance of compliance with diet medication stressed and she vocalized understanding. Ascending aortic aneurysm: Stable at this time.  Patient and son not keen on any further evaluation because they said that he is not a candidate for surgery and I respect their wishes.  The want to pursue medical management and optimize blood pressure control I respect their wishes. Newly diagnosed atrial fibrillation: Stable with ventricular rate being fine.  Patient not a candidate for anticoagulation because of Alzheimer's and frequent falls.  Benefits risks explained and son vocalized understanding and questions were answered to their satisfaction. Essential hypertension: Blood pressure stable and diet was emphasized. Mixed dyslipidemia: Lipid lowering medications.  Blood work to be followed by the facility.  They will send a copy of those records. Patient will be seen in follow-up appointment in 6 months or earlier if the patient has any concerns    Medication Adjustments/Labs and Tests Ordered: Current medicines are reviewed at length with the patient today.  Concerns regarding medicines are outlined above.  No orders of the defined types were placed in this encounter.  No orders of the defined types were placed in this encounter.    No chief complaint on file.    History of Present Illness:    Douglas Grant is a 88 y.o. male.  Patient has past  medical history of coronary artery calcifications, essential hypertension, mixed dyslipidemia and ascending aortic aneurysm.  He denies any problems at this time.  His son brings him in for the visit.  Patient has significant issues with Alzheimer's.  He is brought in in a wheelchair.  His son mentions to me that he has for potential also.  He is fallen a couple of times and he lives in a assisted living facility.  At the time of my evaluation, the patient is alert awake oriented and in no distress.  Past Medical History:  Diagnosis Date   Acute respiratory failure with hypoxia ([place]) 12/29/2019   Arthritis    Ascending aortic aneurysm ([place]) 07/18/2020   ATHEROSCLEROSIS, CORONARY, NATIVE ARTERY 08/21/2007   Qualifier: Diagnosis of  By: [name]     B12 deficiency 04/24/2011   BACK PAIN 07/28/2009   Qualifier: Diagnosis of  By: [name] MD, Belenda Cruise     Benign essential hypertension 12/28/2020   Benign localized prostatic hyperplasia with lower urinary tract symptoms (LUTS)    BENIGN PROSTATIC HYPERTROPHY, HX OF 11/25/2007   Qualifier: Diagnosis of  By: [name]     BPH (benign prostatic hyperplasia) 12/25/2017   Candidiasis of other urogenital sites 07/13/2008   Qualifier: Diagnosis of  By: [name]     Carpal tunnel syndrome 07/28/2007   Centricity Description: CARPAL TUNNEL SYNDROME, RIGHT Qualifier: Diagnosis of  By: [name]   Centricity Description: CARPAL TUNNEL SYNDROME, LEFT, MILD Qualifier: Diagnosis of  By: [name], Hunt Oris  Cervicalgia 06/15/2008   Qualifier: Diagnosis of  By: [name], Hunt Oris    CHF (congestive heart failure) ([place]) 12/29/2019   Chronic diastolic CHF (congestive heart failure) ([place]) 12/30/2019   Controlled type 2 diabetes mellitus with complication, without long-term current use of insulin ([place]) 02/10/2007   Qualifier: Diagnosis of  By: [name]     COVID-19 virus infection 12/30/2019   Dementia without behavioral disturbance ([place]) 07/29/2019    Diabetes mellitus type 2 in obese ([place]) 01/07/2020   Diabetes mellitus type 2, controlled, without complications ([place]) 12/31/5629   Diarrhea 07/26/2017   DOE (dyspnea on exertion)    Elevated d-dimer 02/10/2020   ERECTILE DYSFUNCTION, ORGANIC 08/21/2007   Qualifier: Diagnosis of  By: [name]     Essential hypertension 02/10/2007   Qualifier: Diagnosis of  By: [name]     Full dentures    Gait disturbance    GERD 08/21/2007   Qualifier: Diagnosis of  By: [name]     GERD (gastroesophageal reflux disease)    Headache(784.0) 02/18/2009   Qualifier: Diagnosis of  By: [name], [name]     Heart murmur    HIP PAIN, LEFT 11/10/2007   Qualifier: Diagnosis of  By: [name]     History of CVA in adulthood 03/2011   acute infarct right lateral thalamus posteriorly in internal capsule,  per pt no residuals   HLD (hyperlipidemia) 12/30/2019   Hyperlipidemia    Hyperlipidemia LDL goal <100 02/10/2007   Qualifier: Diagnosis of  By: [name]     Hypertension    Hypothyroidism    Intention tremor 03/20/2018   Iron deficiency anemia    Late onset Alzheimer's dementia with behavioral disturbance ([place]) 12/28/2020   Memory difficulty    OBESITY 08/21/2007   Qualifier: Diagnosis of  By: [name]     Obesity (BMI 30-39.9) 11/16/2013   Obstructive sleep apnea 12/28/2020   Onychomycosis 10/06/2015   Other urinary incontinence 11/25/2007   Qualifier: Diagnosis of  By: [name], Kendrick Fries     Pain in lower limb 10/06/2015   Palpitations 02/10/2020   Paraspinal muscle spasm 08/06/2013   Personal history of colonic polyps    Physical deconditioning 09/09/2020   Pneumonia due to COVID-19 virus 12/30/2019   Pressure injury of skin 12/30/2019   PRURITUS 11/25/2007   Qualifier: Diagnosis of  By: Etter Sjogren DOKendrick Fries     Rash and nonspecific skin eruption 09/16/2016   Seasonal allergies 05/01/2018   SPONDYLOSIS, CERVICAL, WITH RADICULOPATHY 08/11/2007   Qualifier: Diagnosis of  By: [name], SHOULDER/ARM NEC 06/26/2007   Qualifier: Diagnosis of  By: [name], [place]    Stroke ([place]) 04/24/2011   Syncope 07/26/2017   Type 2 diabetes, diet controlled ([place])    VENTRICULAR HYPERTROPHY, LEFT 08/21/2007   Qualifier: Diagnosis of  By: [name]     Visual hallucinations 03/20/2018   Wart 01/18/2014   Wheezing 12/22/2013    Past Surgical History:  Procedure Laterality Date   CATARACT EXTRACTION W/ INTRAOCULAR LENS  IMPLANT, BILATERAL  2017   CIRCUMCISION N/A 10/02/2018   Procedure: CIRCUMCISION ADULT;  Surgeon: [name], MD;  Location: [facility];  Service: Urology;  Laterality: N/A;   HERNIA REPAIR     LAPAROSCOPIC CHOLECYSTECTOMY  early 2000s    Current Medications: No outpatient medications have been marked as taking for the 08/23/22 encounter (Office Visit) with Lerin Jech, Reita Cliche, MD.  Allergies:   Atenolol, Clonidine hydrochloride, Prednisone, and Verapamil   Social History   Socioeconomic History   Marital status: Widowed    Spouse name: Not on file   Number of children: Not on file   Years of education: Not on file   Highest education level: Not on file  Occupational History   Not on file  Tobacco Use   Smoking status: Former    Packs/day: 3.00    Years: 30.00    Total pack years: 90.00    Types: Cigarettes    Quit date: 04/18/1985    Years since quitting: 37.3   Smokeless tobacco: Never  Vaping Use   Vaping Use: Never used  Substance and Sexual Activity   Alcohol use: No    Alcohol/week: 0.0 standard drinks of alcohol   Drug use: Never   Sexual activity: Never  Other Topics Concern   Not on file  Social History Narrative   Not on file   Social Determinants of Health   Financial Resource Strain: Low Risk  (07/27/2020)   Overall Financial Resource Strain (CARDIA)    Difficulty of Paying Living Expenses: Not hard at all  Food Insecurity: No Food Insecurity (07/27/2020)   Hunger Vital Sign    Worried About Running Out of Food  in the Last Year: Never true    [place] in the Last Year: Never true  Transportation Needs: No Transportation Needs (07/27/2020)   PRAPARE - Hydrologist (Medical): No    Lack of Transportation (Non-Medical): No  Physical Activity: Not on file  Stress: Not on file  Social Connections: Not on file     Family History: The patient's family history includes Alcohol abuse in his brother and father; Depression in his mother.  ROS:   Please see the history of present illness.    All other systems reviewed and are negative.  EKGs/Labs/Other Studies Reviewed:    The following studies were reviewed today: EKG reveals atrial fibrillation with well-controlled ventricular rate.   Recent Labs: No results found for requested labs within last 365 days.  Recent Lipid Panel    Component Value Date/Time   CHOL 141 12/31/2019 0100   TRIG 61 12/31/2019 0100   HDL 32 (L) 12/31/2019 0100   CHOLHDL 4.4 12/31/2019 0100   VLDL 12 12/31/2019 0100   LDLCALC 97 12/31/2019 0100   LDLDIRECT 155.3 10/06/2010 0850    Physical Exam:    VS:  BP (!) 130/56   Pulse 76   Ht '5\' 8"'$  (1.727 m)   Wt 191 lb (86.6 kg)   SpO2 90%   BMI 29.04 kg/m     Wt Readings from Last 3 Encounters:  08/23/22 191 lb (86.6 kg)  06/23/21 200 lb 1.9 oz (90.8 kg)  10/31/20 207 lb 4 oz (94 kg)     GEN: Patient is in no acute distress HEENT: Normal NECK: No JVD; No carotid bruits LYMPHATICS: No lymphadenopathy CARDIAC: Hear sounds regular, 2/6 systolic murmur at the apex. RESPIRATORY:  Clear to auscultation without rales, wheezing or rhonchi  ABDOMEN: Soft, non-tender, non-distended MUSCULOSKELETAL:  No edema; No deformity  SKIN: Warm and dry NEUROLOGIC:  Alert and oriented x 3 PSYCHIATRIC:  Normal affect   Signed, Douglas Lindau, MD  08/23/2022 4:16 PM    [facility]

## 2022-08-23 NOTE — Patient Instructions (Addendum)
Medication Instructions:  Your physician recommends that you continue on your current medications as directed. Please refer to the Current Medication list given to you today.  *If you need a refill on your cardiac medications before your next appointment, please call your pharmacy*   Lab Work: Your physician recommends that you return for lab work in: the next few days. You need to have labs done when you are fasting. You do not need to make an appointment as the order has already been placed. The labs you are going to have done are BMET, CBC, TSH, LFT and Lipids.  [address] in [place]. They also close daily for lunch for 12-1.   [address] 2nd floor M-W 8-11:30 and 1-4:30 and Thursday and Friday 8-11:30.   If you have labs (blood work) drawn today and your tests are completely normal, you will receive your results only by: [place] (if you have MyChart) OR A paper copy in the mail If you have any lab test that is abnormal or we need to change your treatment, we will call you to review the results.   Testing/Procedures: None ordered   Follow-Up: At [facility], you and your health needs are our priority.  As part of our continuing mission to provide you with exceptional heart care, we have created designated Provider Care Teams.  These Care Teams include your primary Cardiologist (physician) and Advanced Practice Providers (APPs -  Physician Assistants and Nurse Practitioners) who all work together to provide you with the care you need, when you need it.  We recommend signing up for the patient portal called "MyChart".  Sign up information is provided on this After Visit Summary.  MyChart is used to connect with patients for Virtual Visits (Telemedicine).  Patients are able to view lab/test results, encounter notes, upcoming appointments, etc.  Non-urgent messages can be sent to your provider as well.   To learn more about what you  can do with MyChart, go to [email or web address].    Your next appointment:   12 month(s)  The format for your next appointment:   In Person  Provider:   Jyl Heinz, MD   Other Instructions NA

## 2022-08-28 DIAGNOSIS — D649 Anemia, unspecified: Secondary | ICD-10-CM | POA: Diagnosis not present

## 2022-08-28 DIAGNOSIS — Z79899 Other long term (current) drug therapy: Secondary | ICD-10-CM | POA: Diagnosis not present

## 2022-08-28 DIAGNOSIS — I10 Essential (primary) hypertension: Secondary | ICD-10-CM | POA: Diagnosis not present

## 2022-08-28 DIAGNOSIS — R41841 Cognitive communication deficit: Secondary | ICD-10-CM | POA: Diagnosis not present

## 2022-08-28 DIAGNOSIS — R945 Abnormal results of liver function studies: Secondary | ICD-10-CM | POA: Diagnosis not present

## 2022-08-28 DIAGNOSIS — R499 Unspecified voice and resonance disorder: Secondary | ICD-10-CM | POA: Diagnosis not present

## 2022-08-29 DIAGNOSIS — R41841 Cognitive communication deficit: Secondary | ICD-10-CM | POA: Diagnosis not present

## 2022-08-29 DIAGNOSIS — R499 Unspecified voice and resonance disorder: Secondary | ICD-10-CM | POA: Diagnosis not present

## 2022-08-29 DIAGNOSIS — Z7189 Other specified counseling: Secondary | ICD-10-CM | POA: Diagnosis not present

## 2022-08-31 DIAGNOSIS — R499 Unspecified voice and resonance disorder: Secondary | ICD-10-CM | POA: Diagnosis not present

## 2022-08-31 DIAGNOSIS — R41841 Cognitive communication deficit: Secondary | ICD-10-CM | POA: Diagnosis not present

## 2022-09-03 DIAGNOSIS — R499 Unspecified voice and resonance disorder: Secondary | ICD-10-CM | POA: Diagnosis not present

## 2022-09-03 DIAGNOSIS — R41841 Cognitive communication deficit: Secondary | ICD-10-CM | POA: Diagnosis not present

## 2022-09-04 DIAGNOSIS — F331 Major depressive disorder, recurrent, moderate: Secondary | ICD-10-CM | POA: Diagnosis not present

## 2022-09-04 DIAGNOSIS — G301 Alzheimer's disease with late onset: Secondary | ICD-10-CM | POA: Diagnosis not present

## 2022-09-04 DIAGNOSIS — G251 Drug-induced tremor: Secondary | ICD-10-CM | POA: Diagnosis not present

## 2022-09-04 DIAGNOSIS — G4700 Insomnia, unspecified: Secondary | ICD-10-CM | POA: Diagnosis not present

## 2022-09-04 DIAGNOSIS — R441 Visual hallucinations: Secondary | ICD-10-CM | POA: Diagnosis not present

## 2022-09-25 DIAGNOSIS — I10 Essential (primary) hypertension: Secondary | ICD-10-CM | POA: Diagnosis not present

## 2022-09-25 DIAGNOSIS — E785 Hyperlipidemia, unspecified: Secondary | ICD-10-CM | POA: Diagnosis not present

## 2022-09-25 DIAGNOSIS — E039 Hypothyroidism, unspecified: Secondary | ICD-10-CM | POA: Diagnosis not present

## 2022-09-26 DIAGNOSIS — Z760 Encounter for issue of repeat prescription: Secondary | ICD-10-CM | POA: Diagnosis not present

## 2022-10-02 DIAGNOSIS — F331 Major depressive disorder, recurrent, moderate: Secondary | ICD-10-CM | POA: Diagnosis not present

## 2022-10-02 DIAGNOSIS — G251 Drug-induced tremor: Secondary | ICD-10-CM | POA: Diagnosis not present

## 2022-10-02 DIAGNOSIS — G4700 Insomnia, unspecified: Secondary | ICD-10-CM | POA: Diagnosis not present

## 2022-10-02 DIAGNOSIS — R441 Visual hallucinations: Secondary | ICD-10-CM | POA: Diagnosis not present

## 2022-10-02 DIAGNOSIS — G301 Alzheimer's disease with late onset: Secondary | ICD-10-CM | POA: Diagnosis not present

## 2022-10-15 DIAGNOSIS — M79675 Pain in left toe(s): Secondary | ICD-10-CM | POA: Diagnosis not present

## 2022-10-15 DIAGNOSIS — M79674 Pain in right toe(s): Secondary | ICD-10-CM | POA: Diagnosis not present

## 2022-10-15 DIAGNOSIS — B351 Tinea unguium: Secondary | ICD-10-CM | POA: Diagnosis not present

## 2022-10-17 DIAGNOSIS — F3342 Major depressive disorder, recurrent, in full remission: Secondary | ICD-10-CM | POA: Diagnosis not present

## 2022-10-17 DIAGNOSIS — Z8673 Personal history of transient ischemic attack (TIA), and cerebral infarction without residual deficits: Secondary | ICD-10-CM | POA: Diagnosis not present

## 2022-10-17 DIAGNOSIS — R251 Tremor, unspecified: Secondary | ICD-10-CM | POA: Diagnosis not present

## 2022-10-17 DIAGNOSIS — N189 Chronic kidney disease, unspecified: Secondary | ICD-10-CM | POA: Diagnosis not present

## 2022-10-17 DIAGNOSIS — E1122 Type 2 diabetes mellitus with diabetic chronic kidney disease: Secondary | ICD-10-CM | POA: Diagnosis not present

## 2022-10-17 DIAGNOSIS — I5032 Chronic diastolic (congestive) heart failure: Secondary | ICD-10-CM | POA: Diagnosis not present

## 2022-10-17 DIAGNOSIS — I130 Hypertensive heart and chronic kidney disease with heart failure and stage 1 through stage 4 chronic kidney disease, or unspecified chronic kidney disease: Secondary | ICD-10-CM | POA: Diagnosis not present

## 2022-10-17 DIAGNOSIS — N400 Enlarged prostate without lower urinary tract symptoms: Secondary | ICD-10-CM | POA: Diagnosis not present

## 2022-10-17 DIAGNOSIS — E039 Hypothyroidism, unspecified: Secondary | ICD-10-CM | POA: Diagnosis not present

## 2022-10-17 DIAGNOSIS — J449 Chronic obstructive pulmonary disease, unspecified: Secondary | ICD-10-CM | POA: Diagnosis not present

## 2022-10-17 DIAGNOSIS — Z7984 Long term (current) use of oral hypoglycemic drugs: Secondary | ICD-10-CM | POA: Diagnosis not present

## 2022-10-17 DIAGNOSIS — F03B2 Unspecified dementia, moderate, with psychotic disturbance: Secondary | ICD-10-CM | POA: Diagnosis not present

## 2022-10-17 DIAGNOSIS — F03B3 Unspecified dementia, moderate, with mood disturbance: Secondary | ICD-10-CM | POA: Diagnosis not present

## 2022-11-13 DIAGNOSIS — F331 Major depressive disorder, recurrent, moderate: Secondary | ICD-10-CM | POA: Diagnosis not present

## 2022-11-13 DIAGNOSIS — F0280 Dementia in other diseases classified elsewhere without behavioral disturbance: Secondary | ICD-10-CM | POA: Diagnosis not present

## 2022-11-13 DIAGNOSIS — R441 Visual hallucinations: Secondary | ICD-10-CM | POA: Diagnosis not present

## 2022-11-13 DIAGNOSIS — G309 Alzheimer's disease, unspecified: Secondary | ICD-10-CM | POA: Diagnosis not present

## 2022-11-13 DIAGNOSIS — G251 Drug-induced tremor: Secondary | ICD-10-CM | POA: Diagnosis not present

## 2022-11-13 DIAGNOSIS — G4700 Insomnia, unspecified: Secondary | ICD-10-CM | POA: Diagnosis not present

## 2022-11-19 DIAGNOSIS — G4733 Obstructive sleep apnea (adult) (pediatric): Secondary | ICD-10-CM | POA: Diagnosis not present

## 2022-11-19 DIAGNOSIS — E785 Hyperlipidemia, unspecified: Secondary | ICD-10-CM | POA: Diagnosis not present

## 2022-11-19 DIAGNOSIS — J984 Other disorders of lung: Secondary | ICD-10-CM | POA: Diagnosis not present

## 2022-11-19 DIAGNOSIS — T80818A Extravasation of other vesicant agent, initial encounter: Secondary | ICD-10-CM | POA: Diagnosis not present

## 2022-11-19 DIAGNOSIS — Z7401 Bed confinement status: Secondary | ICD-10-CM | POA: Diagnosis not present

## 2022-11-19 DIAGNOSIS — R278 Other lack of coordination: Secondary | ICD-10-CM | POA: Diagnosis not present

## 2022-11-19 DIAGNOSIS — F0280 Dementia in other diseases classified elsewhere without behavioral disturbance: Secondary | ICD-10-CM | POA: Diagnosis not present

## 2022-11-19 DIAGNOSIS — I5032 Chronic diastolic (congestive) heart failure: Secondary | ICD-10-CM | POA: Diagnosis not present

## 2022-11-19 DIAGNOSIS — R531 Weakness: Secondary | ICD-10-CM | POA: Diagnosis not present

## 2022-11-19 DIAGNOSIS — I959 Hypotension, unspecified: Secondary | ICD-10-CM | POA: Diagnosis not present

## 2022-11-19 DIAGNOSIS — E1165 Type 2 diabetes mellitus with hyperglycemia: Secondary | ICD-10-CM | POA: Diagnosis not present

## 2022-11-19 DIAGNOSIS — G301 Alzheimer's disease with late onset: Secondary | ICD-10-CM | POA: Diagnosis not present

## 2022-11-19 DIAGNOSIS — G309 Alzheimer's disease, unspecified: Secondary | ICD-10-CM | POA: Diagnosis not present

## 2022-11-19 DIAGNOSIS — E46 Unspecified protein-calorie malnutrition: Secondary | ICD-10-CM | POA: Diagnosis not present

## 2022-11-19 DIAGNOSIS — E119 Type 2 diabetes mellitus without complications: Secondary | ICD-10-CM | POA: Diagnosis not present

## 2022-11-19 DIAGNOSIS — R339 Retention of urine, unspecified: Secondary | ICD-10-CM | POA: Diagnosis not present

## 2022-11-19 DIAGNOSIS — J449 Chronic obstructive pulmonary disease, unspecified: Secondary | ICD-10-CM | POA: Diagnosis not present

## 2022-11-19 DIAGNOSIS — Z20822 Contact with and (suspected) exposure to covid-19: Secondary | ICD-10-CM | POA: Diagnosis not present

## 2022-11-19 DIAGNOSIS — R0602 Shortness of breath: Secondary | ICD-10-CM | POA: Diagnosis not present

## 2022-11-19 DIAGNOSIS — R739 Hyperglycemia, unspecified: Secondary | ICD-10-CM | POA: Diagnosis not present

## 2022-11-19 DIAGNOSIS — E876 Hypokalemia: Secondary | ICD-10-CM | POA: Diagnosis not present

## 2022-11-19 DIAGNOSIS — I639 Cerebral infarction, unspecified: Secondary | ICD-10-CM | POA: Diagnosis not present

## 2022-11-19 DIAGNOSIS — E872 Acidosis, unspecified: Secondary | ICD-10-CM | POA: Diagnosis not present

## 2022-11-19 DIAGNOSIS — R296 Repeated falls: Secondary | ICD-10-CM | POA: Diagnosis not present

## 2022-11-19 DIAGNOSIS — J9601 Acute respiratory failure with hypoxia: Secondary | ICD-10-CM | POA: Diagnosis not present

## 2022-11-19 DIAGNOSIS — J9621 Acute and chronic respiratory failure with hypoxia: Secondary | ICD-10-CM | POA: Diagnosis not present

## 2022-11-19 DIAGNOSIS — J9811 Atelectasis: Secondary | ICD-10-CM | POA: Diagnosis not present

## 2022-11-19 DIAGNOSIS — F02818 Dementia in other diseases classified elsewhere, unspecified severity, with other behavioral disturbance: Secondary | ICD-10-CM | POA: Diagnosis not present

## 2022-11-19 DIAGNOSIS — I10 Essential (primary) hypertension: Secondary | ICD-10-CM | POA: Diagnosis not present

## 2022-11-19 DIAGNOSIS — R41841 Cognitive communication deficit: Secondary | ICD-10-CM | POA: Diagnosis not present

## 2022-11-19 DIAGNOSIS — I110 Hypertensive heart disease with heart failure: Secondary | ICD-10-CM | POA: Diagnosis not present

## 2022-11-19 DIAGNOSIS — R0902 Hypoxemia: Secondary | ICD-10-CM | POA: Diagnosis not present

## 2022-11-19 DIAGNOSIS — Z515 Encounter for palliative care: Secondary | ICD-10-CM | POA: Diagnosis not present

## 2022-11-19 DIAGNOSIS — R404 Transient alteration of awareness: Secondary | ICD-10-CM | POA: Diagnosis not present

## 2022-11-19 DIAGNOSIS — Z794 Long term (current) use of insulin: Secondary | ICD-10-CM | POA: Diagnosis not present

## 2022-11-19 DIAGNOSIS — Z781 Physical restraint status: Secondary | ICD-10-CM | POA: Diagnosis not present

## 2022-11-19 DIAGNOSIS — A419 Sepsis, unspecified organism: Secondary | ICD-10-CM | POA: Diagnosis not present

## 2022-11-19 DIAGNOSIS — M6281 Muscle weakness (generalized): Secondary | ICD-10-CM | POA: Diagnosis not present

## 2022-11-19 DIAGNOSIS — R0600 Dyspnea, unspecified: Secondary | ICD-10-CM | POA: Diagnosis not present

## 2022-11-19 DIAGNOSIS — E039 Hypothyroidism, unspecified: Secondary | ICD-10-CM | POA: Diagnosis not present

## 2022-11-19 DIAGNOSIS — Z9981 Dependence on supplemental oxygen: Secondary | ICD-10-CM | POA: Diagnosis not present

## 2022-11-19 DIAGNOSIS — R4182 Altered mental status, unspecified: Secondary | ICD-10-CM | POA: Diagnosis not present

## 2022-11-19 DIAGNOSIS — R0689 Other abnormalities of breathing: Secondary | ICD-10-CM | POA: Diagnosis not present

## 2022-11-19 DIAGNOSIS — R6520 Severe sepsis without septic shock: Secondary | ICD-10-CM | POA: Diagnosis not present

## 2022-11-19 DIAGNOSIS — J209 Acute bronchitis, unspecified: Secondary | ICD-10-CM | POA: Diagnosis not present

## 2022-11-19 DIAGNOSIS — J9600 Acute respiratory failure, unspecified whether with hypoxia or hypercapnia: Secondary | ICD-10-CM | POA: Diagnosis not present

## 2022-11-19 DIAGNOSIS — N400 Enlarged prostate without lower urinary tract symptoms: Secondary | ICD-10-CM | POA: Diagnosis not present

## 2022-11-19 DIAGNOSIS — R1310 Dysphagia, unspecified: Secondary | ICD-10-CM | POA: Diagnosis not present

## 2022-11-19 DIAGNOSIS — T8089XA Other complications following infusion, transfusion and therapeutic injection, initial encounter: Secondary | ICD-10-CM | POA: Diagnosis not present

## 2022-11-19 DIAGNOSIS — J9690 Respiratory failure, unspecified, unspecified whether with hypoxia or hypercapnia: Secondary | ICD-10-CM | POA: Diagnosis not present

## 2022-11-19 DIAGNOSIS — J90 Pleural effusion, not elsewhere classified: Secondary | ICD-10-CM | POA: Diagnosis not present

## 2022-11-19 DIAGNOSIS — N179 Acute kidney failure, unspecified: Secondary | ICD-10-CM | POA: Diagnosis not present

## 2022-11-19 DIAGNOSIS — R2681 Unsteadiness on feet: Secondary | ICD-10-CM | POA: Diagnosis not present

## 2022-11-19 DIAGNOSIS — R5381 Other malaise: Secondary | ICD-10-CM | POA: Diagnosis not present

## 2022-11-19 DIAGNOSIS — E875 Hyperkalemia: Secondary | ICD-10-CM | POA: Diagnosis not present

## 2022-11-19 DIAGNOSIS — F0390 Unspecified dementia without behavioral disturbance: Secondary | ICD-10-CM | POA: Diagnosis not present

## 2022-11-19 DIAGNOSIS — Z66 Do not resuscitate: Secondary | ICD-10-CM | POA: Diagnosis not present

## 2022-11-19 DIAGNOSIS — Z7409 Other reduced mobility: Secondary | ICD-10-CM | POA: Diagnosis not present

## 2022-11-19 DIAGNOSIS — M47812 Spondylosis without myelopathy or radiculopathy, cervical region: Secondary | ICD-10-CM | POA: Diagnosis not present

## 2022-11-19 DIAGNOSIS — J189 Pneumonia, unspecified organism: Secondary | ICD-10-CM | POA: Diagnosis not present

## 2022-11-19 DIAGNOSIS — F339 Major depressive disorder, recurrent, unspecified: Secondary | ICD-10-CM | POA: Diagnosis not present

## 2022-11-19 DIAGNOSIS — R1312 Dysphagia, oropharyngeal phase: Secondary | ICD-10-CM | POA: Diagnosis not present

## 2022-11-29 DIAGNOSIS — G319 Degenerative disease of nervous system, unspecified: Secondary | ICD-10-CM | POA: Diagnosis not present

## 2022-11-29 DIAGNOSIS — E039 Hypothyroidism, unspecified: Secondary | ICD-10-CM | POA: Diagnosis not present

## 2022-11-29 DIAGNOSIS — Z043 Encounter for examination and observation following other accident: Secondary | ICD-10-CM | POA: Diagnosis not present

## 2022-11-29 DIAGNOSIS — R918 Other nonspecific abnormal finding of lung field: Secondary | ICD-10-CM | POA: Diagnosis not present

## 2022-11-29 DIAGNOSIS — S72002A Fracture of unspecified part of neck of left femur, initial encounter for closed fracture: Secondary | ICD-10-CM | POA: Diagnosis not present

## 2022-11-29 DIAGNOSIS — I10 Essential (primary) hypertension: Secondary | ICD-10-CM | POA: Diagnosis not present

## 2022-11-29 DIAGNOSIS — R278 Other lack of coordination: Secondary | ICD-10-CM | POA: Diagnosis not present

## 2022-11-29 DIAGNOSIS — R9082 White matter disease, unspecified: Secondary | ICD-10-CM | POA: Diagnosis not present

## 2022-11-29 DIAGNOSIS — Z66 Do not resuscitate: Secondary | ICD-10-CM | POA: Diagnosis not present

## 2022-11-29 DIAGNOSIS — F0390 Unspecified dementia without behavioral disturbance: Secondary | ICD-10-CM | POA: Diagnosis not present

## 2022-11-29 DIAGNOSIS — Z7409 Other reduced mobility: Secondary | ICD-10-CM | POA: Diagnosis not present

## 2022-11-29 DIAGNOSIS — I959 Hypotension, unspecified: Secondary | ICD-10-CM | POA: Diagnosis not present

## 2022-11-29 DIAGNOSIS — Z794 Long term (current) use of insulin: Secondary | ICD-10-CM | POA: Diagnosis not present

## 2022-11-29 DIAGNOSIS — M25552 Pain in left hip: Secondary | ICD-10-CM | POA: Diagnosis not present

## 2022-11-29 DIAGNOSIS — G301 Alzheimer's disease with late onset: Secondary | ICD-10-CM | POA: Diagnosis not present

## 2022-11-29 DIAGNOSIS — M6281 Muscle weakness (generalized): Secondary | ICD-10-CM | POA: Diagnosis not present

## 2022-11-29 DIAGNOSIS — R269 Unspecified abnormalities of gait and mobility: Secondary | ICD-10-CM | POA: Diagnosis not present

## 2022-11-29 DIAGNOSIS — Z79890 Hormone replacement therapy (postmenopausal): Secondary | ICD-10-CM | POA: Diagnosis not present

## 2022-11-29 DIAGNOSIS — J9621 Acute and chronic respiratory failure with hypoxia: Secondary | ICD-10-CM | POA: Diagnosis not present

## 2022-11-29 DIAGNOSIS — Z7401 Bed confinement status: Secondary | ICD-10-CM | POA: Diagnosis not present

## 2022-11-29 DIAGNOSIS — E785 Hyperlipidemia, unspecified: Secondary | ICD-10-CM | POA: Diagnosis not present

## 2022-11-29 DIAGNOSIS — M25559 Pain in unspecified hip: Secondary | ICD-10-CM | POA: Diagnosis not present

## 2022-11-29 DIAGNOSIS — R531 Weakness: Secondary | ICD-10-CM | POA: Diagnosis not present

## 2022-11-29 DIAGNOSIS — Z79899 Other long term (current) drug therapy: Secondary | ICD-10-CM | POA: Diagnosis not present

## 2022-11-29 DIAGNOSIS — Z9981 Dependence on supplemental oxygen: Secondary | ICD-10-CM | POA: Diagnosis not present

## 2022-11-29 DIAGNOSIS — R41841 Cognitive communication deficit: Secondary | ICD-10-CM | POA: Diagnosis not present

## 2022-11-29 DIAGNOSIS — F03C2 Unspecified dementia, severe, with psychotic disturbance: Secondary | ICD-10-CM | POA: Diagnosis not present

## 2022-11-29 DIAGNOSIS — F02818 Dementia in other diseases classified elsewhere, unspecified severity, with other behavioral disturbance: Secondary | ICD-10-CM | POA: Diagnosis not present

## 2022-11-29 DIAGNOSIS — S72012A Unspecified intracapsular fracture of left femur, initial encounter for closed fracture: Secondary | ICD-10-CM | POA: Diagnosis not present

## 2022-11-29 DIAGNOSIS — J449 Chronic obstructive pulmonary disease, unspecified: Secondary | ICD-10-CM | POA: Diagnosis not present

## 2022-11-29 DIAGNOSIS — E46 Unspecified protein-calorie malnutrition: Secondary | ICD-10-CM | POA: Diagnosis not present

## 2022-11-29 DIAGNOSIS — I491 Atrial premature depolarization: Secondary | ICD-10-CM | POA: Diagnosis not present

## 2022-11-29 DIAGNOSIS — D62 Acute posthemorrhagic anemia: Secondary | ICD-10-CM | POA: Diagnosis not present

## 2022-11-29 DIAGNOSIS — N400 Enlarged prostate without lower urinary tract symptoms: Secondary | ICD-10-CM | POA: Diagnosis not present

## 2022-11-29 DIAGNOSIS — Z7984 Long term (current) use of oral hypoglycemic drugs: Secondary | ICD-10-CM | POA: Diagnosis not present

## 2022-11-29 DIAGNOSIS — I700 Atherosclerosis of aorta: Secondary | ICD-10-CM | POA: Diagnosis not present

## 2022-11-29 DIAGNOSIS — I82402 Acute embolism and thrombosis of unspecified deep veins of left lower extremity: Secondary | ICD-10-CM | POA: Diagnosis not present

## 2022-11-29 DIAGNOSIS — I493 Ventricular premature depolarization: Secondary | ICD-10-CM | POA: Diagnosis not present

## 2022-11-29 DIAGNOSIS — I82442 Acute embolism and thrombosis of left tibial vein: Secondary | ICD-10-CM | POA: Diagnosis not present

## 2022-11-29 DIAGNOSIS — F32A Depression, unspecified: Secondary | ICD-10-CM | POA: Diagnosis not present

## 2022-11-29 DIAGNOSIS — F03918 Unspecified dementia, unspecified severity, with other behavioral disturbance: Secondary | ICD-10-CM | POA: Diagnosis not present

## 2022-11-29 DIAGNOSIS — J9611 Chronic respiratory failure with hypoxia: Secondary | ICD-10-CM | POA: Diagnosis not present

## 2022-11-29 DIAGNOSIS — F339 Major depressive disorder, recurrent, unspecified: Secondary | ICD-10-CM | POA: Diagnosis not present

## 2022-11-29 DIAGNOSIS — F419 Anxiety disorder, unspecified: Secondary | ICD-10-CM | POA: Diagnosis not present

## 2022-11-29 DIAGNOSIS — E1165 Type 2 diabetes mellitus with hyperglycemia: Secondary | ICD-10-CM | POA: Diagnosis not present

## 2022-11-29 DIAGNOSIS — Z7952 Long term (current) use of systemic steroids: Secondary | ICD-10-CM | POA: Diagnosis not present

## 2022-11-29 DIAGNOSIS — I5030 Unspecified diastolic (congestive) heart failure: Secondary | ICD-10-CM | POA: Diagnosis not present

## 2022-11-29 DIAGNOSIS — Z01818 Encounter for other preprocedural examination: Secondary | ICD-10-CM | POA: Diagnosis not present

## 2022-11-29 DIAGNOSIS — S79919A Unspecified injury of unspecified hip, initial encounter: Secondary | ICD-10-CM | POA: Diagnosis not present

## 2022-11-29 DIAGNOSIS — M24562 Contracture, left knee: Secondary | ICD-10-CM | POA: Diagnosis not present

## 2022-11-29 DIAGNOSIS — I110 Hypertensive heart disease with heart failure: Secondary | ICD-10-CM | POA: Diagnosis not present

## 2022-11-29 DIAGNOSIS — Z515 Encounter for palliative care: Secondary | ICD-10-CM | POA: Diagnosis not present

## 2022-11-29 DIAGNOSIS — E119 Type 2 diabetes mellitus without complications: Secondary | ICD-10-CM | POA: Diagnosis not present

## 2022-11-29 DIAGNOSIS — R2681 Unsteadiness on feet: Secondary | ICD-10-CM | POA: Diagnosis not present

## 2022-11-30 DIAGNOSIS — R269 Unspecified abnormalities of gait and mobility: Secondary | ICD-10-CM | POA: Diagnosis not present

## 2022-11-30 DIAGNOSIS — F03918 Unspecified dementia, unspecified severity, with other behavioral disturbance: Secondary | ICD-10-CM | POA: Diagnosis not present

## 2022-11-30 DIAGNOSIS — E119 Type 2 diabetes mellitus without complications: Secondary | ICD-10-CM | POA: Diagnosis not present

## 2022-11-30 DIAGNOSIS — E039 Hypothyroidism, unspecified: Secondary | ICD-10-CM | POA: Diagnosis not present

## 2022-11-30 DIAGNOSIS — J9611 Chronic respiratory failure with hypoxia: Secondary | ICD-10-CM | POA: Diagnosis not present

## 2022-12-03 DIAGNOSIS — F03918 Unspecified dementia, unspecified severity, with other behavioral disturbance: Secondary | ICD-10-CM | POA: Diagnosis not present

## 2022-12-03 DIAGNOSIS — M25552 Pain in left hip: Secondary | ICD-10-CM | POA: Diagnosis not present

## 2022-12-03 DIAGNOSIS — E119 Type 2 diabetes mellitus without complications: Secondary | ICD-10-CM | POA: Diagnosis not present

## 2022-12-03 DIAGNOSIS — E039 Hypothyroidism, unspecified: Secondary | ICD-10-CM | POA: Diagnosis not present

## 2022-12-03 DIAGNOSIS — M24562 Contracture, left knee: Secondary | ICD-10-CM | POA: Diagnosis not present

## 2022-12-05 DIAGNOSIS — E039 Hypothyroidism, unspecified: Secondary | ICD-10-CM | POA: Diagnosis not present

## 2022-12-05 DIAGNOSIS — I700 Atherosclerosis of aorta: Secondary | ICD-10-CM | POA: Diagnosis not present

## 2022-12-05 DIAGNOSIS — F02818 Dementia in other diseases classified elsewhere, unspecified severity, with other behavioral disturbance: Secondary | ICD-10-CM | POA: Diagnosis not present

## 2022-12-05 DIAGNOSIS — Z01818 Encounter for other preprocedural examination: Secondary | ICD-10-CM | POA: Diagnosis not present

## 2022-12-05 DIAGNOSIS — Z9981 Dependence on supplemental oxygen: Secondary | ICD-10-CM | POA: Diagnosis not present

## 2022-12-05 DIAGNOSIS — R2681 Unsteadiness on feet: Secondary | ICD-10-CM | POA: Diagnosis not present

## 2022-12-05 DIAGNOSIS — J9611 Chronic respiratory failure with hypoxia: Secondary | ICD-10-CM | POA: Diagnosis not present

## 2022-12-05 DIAGNOSIS — Z96642 Presence of left artificial hip joint: Secondary | ICD-10-CM | POA: Diagnosis not present

## 2022-12-05 DIAGNOSIS — R278 Other lack of coordination: Secondary | ICD-10-CM | POA: Diagnosis not present

## 2022-12-05 DIAGNOSIS — Z471 Aftercare following joint replacement surgery: Secondary | ICD-10-CM | POA: Diagnosis not present

## 2022-12-05 DIAGNOSIS — Z794 Long term (current) use of insulin: Secondary | ICD-10-CM | POA: Diagnosis not present

## 2022-12-05 DIAGNOSIS — Z79890 Hormone replacement therapy (postmenopausal): Secondary | ICD-10-CM | POA: Diagnosis not present

## 2022-12-05 DIAGNOSIS — F419 Anxiety disorder, unspecified: Secondary | ICD-10-CM | POA: Diagnosis not present

## 2022-12-05 DIAGNOSIS — R600 Localized edema: Secondary | ICD-10-CM | POA: Diagnosis not present

## 2022-12-05 DIAGNOSIS — E785 Hyperlipidemia, unspecified: Secondary | ICD-10-CM | POA: Diagnosis not present

## 2022-12-05 DIAGNOSIS — I491 Atrial premature depolarization: Secondary | ICD-10-CM | POA: Diagnosis not present

## 2022-12-05 DIAGNOSIS — D62 Acute posthemorrhagic anemia: Secondary | ICD-10-CM | POA: Diagnosis not present

## 2022-12-05 DIAGNOSIS — S79919A Unspecified injury of unspecified hip, initial encounter: Secondary | ICD-10-CM | POA: Diagnosis not present

## 2022-12-05 DIAGNOSIS — R9082 White matter disease, unspecified: Secondary | ICD-10-CM | POA: Diagnosis not present

## 2022-12-05 DIAGNOSIS — E1165 Type 2 diabetes mellitus with hyperglycemia: Secondary | ICD-10-CM | POA: Diagnosis not present

## 2022-12-05 DIAGNOSIS — I10 Essential (primary) hypertension: Secondary | ICD-10-CM | POA: Diagnosis not present

## 2022-12-05 DIAGNOSIS — S72002A Fracture of unspecified part of neck of left femur, initial encounter for closed fracture: Secondary | ICD-10-CM | POA: Diagnosis not present

## 2022-12-05 DIAGNOSIS — Z66 Do not resuscitate: Secondary | ICD-10-CM | POA: Diagnosis not present

## 2022-12-05 DIAGNOSIS — R918 Other nonspecific abnormal finding of lung field: Secondary | ICD-10-CM | POA: Diagnosis not present

## 2022-12-05 DIAGNOSIS — E119 Type 2 diabetes mellitus without complications: Secondary | ICD-10-CM | POA: Diagnosis not present

## 2022-12-05 DIAGNOSIS — Z7952 Long term (current) use of systemic steroids: Secondary | ICD-10-CM | POA: Diagnosis not present

## 2022-12-05 DIAGNOSIS — Z043 Encounter for examination and observation following other accident: Secondary | ICD-10-CM | POA: Diagnosis not present

## 2022-12-05 DIAGNOSIS — I493 Ventricular premature depolarization: Secondary | ICD-10-CM | POA: Diagnosis not present

## 2022-12-05 DIAGNOSIS — F03C2 Unspecified dementia, severe, with psychotic disturbance: Secondary | ICD-10-CM | POA: Diagnosis not present

## 2022-12-05 DIAGNOSIS — S72012A Unspecified intracapsular fracture of left femur, initial encounter for closed fracture: Secondary | ICD-10-CM | POA: Diagnosis not present

## 2022-12-05 DIAGNOSIS — G319 Degenerative disease of nervous system, unspecified: Secondary | ICD-10-CM | POA: Diagnosis not present

## 2022-12-05 DIAGNOSIS — G8918 Other acute postprocedural pain: Secondary | ICD-10-CM | POA: Diagnosis not present

## 2022-12-05 DIAGNOSIS — I509 Heart failure, unspecified: Secondary | ICD-10-CM | POA: Diagnosis not present

## 2022-12-05 DIAGNOSIS — G301 Alzheimer's disease with late onset: Secondary | ICD-10-CM | POA: Diagnosis not present

## 2022-12-05 DIAGNOSIS — I5030 Unspecified diastolic (congestive) heart failure: Secondary | ICD-10-CM | POA: Diagnosis not present

## 2022-12-05 DIAGNOSIS — N400 Enlarged prostate without lower urinary tract symptoms: Secondary | ICD-10-CM | POA: Diagnosis not present

## 2022-12-05 DIAGNOSIS — I82402 Acute embolism and thrombosis of unspecified deep veins of left lower extremity: Secondary | ICD-10-CM | POA: Diagnosis not present

## 2022-12-05 DIAGNOSIS — R531 Weakness: Secondary | ICD-10-CM | POA: Diagnosis not present

## 2022-12-05 DIAGNOSIS — M6281 Muscle weakness (generalized): Secondary | ICD-10-CM | POA: Diagnosis not present

## 2022-12-05 DIAGNOSIS — S72002D Fracture of unspecified part of neck of left femur, subsequent encounter for closed fracture with routine healing: Secondary | ICD-10-CM | POA: Diagnosis not present

## 2022-12-05 DIAGNOSIS — M25559 Pain in unspecified hip: Secondary | ICD-10-CM | POA: Diagnosis not present

## 2022-12-05 DIAGNOSIS — F339 Major depressive disorder, recurrent, unspecified: Secondary | ICD-10-CM | POA: Diagnosis not present

## 2022-12-05 DIAGNOSIS — F0390 Unspecified dementia without behavioral disturbance: Secondary | ICD-10-CM | POA: Diagnosis not present

## 2022-12-05 DIAGNOSIS — R41841 Cognitive communication deficit: Secondary | ICD-10-CM | POA: Diagnosis not present

## 2022-12-05 DIAGNOSIS — J9621 Acute and chronic respiratory failure with hypoxia: Secondary | ICD-10-CM | POA: Diagnosis not present

## 2022-12-05 DIAGNOSIS — E46 Unspecified protein-calorie malnutrition: Secondary | ICD-10-CM | POA: Diagnosis not present

## 2022-12-05 DIAGNOSIS — F32A Depression, unspecified: Secondary | ICD-10-CM | POA: Diagnosis not present

## 2022-12-05 DIAGNOSIS — J449 Chronic obstructive pulmonary disease, unspecified: Secondary | ICD-10-CM | POA: Diagnosis not present

## 2022-12-05 DIAGNOSIS — I82442 Acute embolism and thrombosis of left tibial vein: Secondary | ICD-10-CM | POA: Diagnosis not present

## 2022-12-05 DIAGNOSIS — Z79899 Other long term (current) drug therapy: Secondary | ICD-10-CM | POA: Diagnosis not present

## 2022-12-05 DIAGNOSIS — M25552 Pain in left hip: Secondary | ICD-10-CM | POA: Diagnosis not present

## 2022-12-05 DIAGNOSIS — Z7984 Long term (current) use of oral hypoglycemic drugs: Secondary | ICD-10-CM | POA: Diagnosis not present

## 2022-12-05 DIAGNOSIS — I110 Hypertensive heart disease with heart failure: Secondary | ICD-10-CM | POA: Diagnosis not present

## 2022-12-10 DIAGNOSIS — F339 Major depressive disorder, recurrent, unspecified: Secondary | ICD-10-CM | POA: Diagnosis not present

## 2022-12-10 DIAGNOSIS — R600 Localized edema: Secondary | ICD-10-CM | POA: Diagnosis not present

## 2022-12-10 DIAGNOSIS — R2681 Unsteadiness on feet: Secondary | ICD-10-CM | POA: Diagnosis not present

## 2022-12-10 DIAGNOSIS — E039 Hypothyroidism, unspecified: Secondary | ICD-10-CM | POA: Diagnosis not present

## 2022-12-10 DIAGNOSIS — I82442 Acute embolism and thrombosis of left tibial vein: Secondary | ICD-10-CM | POA: Diagnosis not present

## 2022-12-10 DIAGNOSIS — R278 Other lack of coordination: Secondary | ICD-10-CM | POA: Diagnosis not present

## 2022-12-10 DIAGNOSIS — R41841 Cognitive communication deficit: Secondary | ICD-10-CM | POA: Diagnosis not present

## 2022-12-10 DIAGNOSIS — E785 Hyperlipidemia, unspecified: Secondary | ICD-10-CM | POA: Diagnosis not present

## 2022-12-10 DIAGNOSIS — J9611 Chronic respiratory failure with hypoxia: Secondary | ICD-10-CM | POA: Diagnosis not present

## 2022-12-10 DIAGNOSIS — G301 Alzheimer's disease with late onset: Secondary | ICD-10-CM | POA: Diagnosis not present

## 2022-12-10 DIAGNOSIS — F0390 Unspecified dementia without behavioral disturbance: Secondary | ICD-10-CM | POA: Diagnosis not present

## 2022-12-10 DIAGNOSIS — I10 Essential (primary) hypertension: Secondary | ICD-10-CM | POA: Diagnosis not present

## 2022-12-10 DIAGNOSIS — S72002A Fracture of unspecified part of neck of left femur, initial encounter for closed fracture: Secondary | ICD-10-CM | POA: Diagnosis not present

## 2022-12-10 DIAGNOSIS — M6281 Muscle weakness (generalized): Secondary | ICD-10-CM | POA: Diagnosis not present

## 2022-12-10 DIAGNOSIS — J9621 Acute and chronic respiratory failure with hypoxia: Secondary | ICD-10-CM | POA: Diagnosis not present

## 2022-12-10 DIAGNOSIS — S72002D Fracture of unspecified part of neck of left femur, subsequent encounter for closed fracture with routine healing: Secondary | ICD-10-CM | POA: Diagnosis not present

## 2022-12-10 DIAGNOSIS — Z471 Aftercare following joint replacement surgery: Secondary | ICD-10-CM | POA: Diagnosis not present

## 2022-12-10 DIAGNOSIS — J449 Chronic obstructive pulmonary disease, unspecified: Secondary | ICD-10-CM | POA: Diagnosis not present

## 2022-12-10 DIAGNOSIS — E119 Type 2 diabetes mellitus without complications: Secondary | ICD-10-CM | POA: Diagnosis not present

## 2022-12-10 DIAGNOSIS — Z794 Long term (current) use of insulin: Secondary | ICD-10-CM | POA: Diagnosis not present

## 2022-12-10 DIAGNOSIS — F02818 Dementia in other diseases classified elsewhere, unspecified severity, with other behavioral disturbance: Secondary | ICD-10-CM | POA: Diagnosis not present

## 2022-12-10 DIAGNOSIS — N400 Enlarged prostate without lower urinary tract symptoms: Secondary | ICD-10-CM | POA: Diagnosis not present

## 2022-12-10 DIAGNOSIS — E46 Unspecified protein-calorie malnutrition: Secondary | ICD-10-CM | POA: Diagnosis not present

## 2022-12-10 DIAGNOSIS — F03918 Unspecified dementia, unspecified severity, with other behavioral disturbance: Secondary | ICD-10-CM | POA: Diagnosis not present

## 2022-12-23 DIAGNOSIS — J9611 Chronic respiratory failure with hypoxia: Secondary | ICD-10-CM | POA: Diagnosis not present

## 2022-12-23 DIAGNOSIS — F03918 Unspecified dementia, unspecified severity, with other behavioral disturbance: Secondary | ICD-10-CM | POA: Diagnosis not present

## 2022-12-23 DIAGNOSIS — S72002A Fracture of unspecified part of neck of left femur, initial encounter for closed fracture: Secondary | ICD-10-CM | POA: Diagnosis not present

## 2022-12-23 DIAGNOSIS — I82442 Acute embolism and thrombosis of left tibial vein: Secondary | ICD-10-CM | POA: Diagnosis not present

## 2022-12-23 DIAGNOSIS — E119 Type 2 diabetes mellitus without complications: Secondary | ICD-10-CM | POA: Diagnosis not present

## 2023-03-17 ENCOUNTER — Other Ambulatory Visit: Payer: Self-pay

## 2023-03-17 ENCOUNTER — Encounter (HOSPITAL_COMMUNITY): Payer: Self-pay | Admitting: Emergency Medicine

## 2023-03-17 ENCOUNTER — Emergency Department (HOSPITAL_COMMUNITY): Payer: Medicare Other

## 2023-03-17 ENCOUNTER — Emergency Department (HOSPITAL_COMMUNITY)
Admission: EM | Admit: 2023-03-17 | Discharge: 2023-03-17 | Disposition: A | Discharge status: Skilled Nursing Facility | Payer: Medicare Other | Attending: Emergency Medicine | Admitting: Emergency Medicine

## 2023-03-17 DIAGNOSIS — Y92129 Unspecified place in nursing home as the place of occurrence of the external cause: Secondary | ICD-10-CM | POA: Diagnosis not present

## 2023-03-17 DIAGNOSIS — Z794 Long term (current) use of insulin: Secondary | ICD-10-CM | POA: Insufficient documentation

## 2023-03-17 DIAGNOSIS — I4891 Unspecified atrial fibrillation: Secondary | ICD-10-CM | POA: Insufficient documentation

## 2023-03-17 DIAGNOSIS — W050XXA Fall from non-moving wheelchair, initial encounter: Secondary | ICD-10-CM | POA: Insufficient documentation

## 2023-03-17 DIAGNOSIS — S0081XA Abrasion of other part of head, initial encounter: Secondary | ICD-10-CM | POA: Insufficient documentation

## 2023-03-17 DIAGNOSIS — S0990XA Unspecified injury of head, initial encounter: Secondary | ICD-10-CM | POA: Diagnosis present

## 2023-03-17 DIAGNOSIS — Z7901 Long term (current) use of anticoagulants: Secondary | ICD-10-CM | POA: Insufficient documentation

## 2023-03-17 DIAGNOSIS — W19XXXA Unspecified fall, initial encounter: Secondary | ICD-10-CM

## 2023-03-17 DIAGNOSIS — G309 Alzheimer's disease, unspecified: Secondary | ICD-10-CM | POA: Diagnosis not present

## 2023-03-17 NOTE — ED Notes (Signed)
Trauma Response Nurse Documentation  Douglas Grant is a 89 y.o. male arriving to [facility] ED via EMS  On Eliquis (apixaban) daily. Trauma was activated as a Level 2 based on the following trauma criteria Elderly patients > 65 with head trauma on anti-coagulation (excluding ASA). Trauma team at the bedside on patient arrival.   Patient cleared for CT by [name]. Pt transported to CT with trauma response nurse present to monitor. RN remained with the patient throughout their absence from the department for clinical observation. GCS 14.  History   Past Medical History:  Diagnosis Date   Acute respiratory failure with hypoxia ([place]) 12/29/2019   Arthritis    Ascending aortic aneurysm ([place]) 07/18/2020   ATHEROSCLEROSIS, CORONARY, NATIVE ARTERY 08/21/2007   Qualifier: Diagnosis of  By: [name]     B12 deficiency 04/24/2011   BACK PAIN 07/28/2009   Qualifier: Diagnosis of  By: [name] MD, Belenda Cruise     Benign essential hypertension 12/28/2020   Benign localized prostatic hyperplasia with lower urinary tract symptoms (LUTS)    BENIGN PROSTATIC HYPERTROPHY, HX OF 11/25/2007   Qualifier: Diagnosis of  By: [name]     BPH (benign prostatic hyperplasia) 12/25/2017   Candidiasis of other urogenital sites 07/13/2008   Qualifier: Diagnosis of  By: [name]     Carpal tunnel syndrome 07/28/2007   Centricity Description: CARPAL TUNNEL SYNDROME, RIGHT Qualifier: Diagnosis of  By: [name]   Centricity Description: CARPAL TUNNEL SYNDROME, LEFT, MILD Qualifier: Diagnosis of  By: [name], Hunt Oris    Cervicalgia 06/15/2008   Qualifier: Diagnosis of  By: [name], Hunt Oris    CHF (congestive heart failure) ([place]) 12/29/2019   Chronic diastolic CHF (congestive heart failure) ([place]) 12/30/2019   Controlled type 2 diabetes mellitus with complication, without long-term current use of insulin ([place]) 02/10/2007   Qualifier: Diagnosis of  By: [name]     COVID-19 virus infection 12/30/2019    Dementia without behavioral disturbance ([place]) 07/29/2019   Diabetes mellitus type 2 in obese ([place]) 01/07/2020   Diabetes mellitus type 2, controlled, without complications ([place]) [id number]   Diarrhea 07/26/2017   DOE (dyspnea on exertion)    Elevated d-dimer 02/10/2020   ERECTILE DYSFUNCTION, ORGANIC 08/21/2007   Qualifier: Diagnosis of  By: [name]     Essential hypertension 02/10/2007   Qualifier: Diagnosis of  By: [name]     Full dentures    Gait disturbance    GERD 08/21/2007   Qualifier: Diagnosis of  By: [name]     GERD (gastroesophageal reflux disease)    Headache(784.0) 02/18/2009   Qualifier: Diagnosis of  By: [name], [name]     Heart murmur    HIP PAIN, LEFT 11/10/2007   Qualifier: Diagnosis of  By: [name]     History of CVA in adulthood 03/2011   acute infarct right lateral thalamus posteriorly in internal capsule,  per pt no residuals   HLD (hyperlipidemia) 12/30/2019   Hyperlipidemia    Hyperlipidemia LDL goal <100 02/10/2007   Qualifier: Diagnosis of  By: [name]     Hypertension    Hypothyroidism    Intention tremor 03/20/2018   Iron deficiency anemia    Late onset Alzheimer's dementia with behavioral disturbance ([place]) 12/28/2020   Memory difficulty    OBESITY 08/21/2007   Qualifier: Diagnosis of  By: [name]     Obesity (BMI 30-39.9) 11/16/2013  Obstructive sleep apnea 12/28/2020   Onychomycosis 10/06/2015   Other urinary incontinence 11/25/2007   Qualifier: Diagnosis of  By: [name], [name]     Pain in lower limb 10/06/2015   Palpitations 02/10/2020   Paraspinal muscle spasm 08/06/2013   Personal history of colonic polyps    Physical deconditioning 09/09/2020   Pneumonia due to COVID-19 virus 12/30/2019   Pressure injury of skin 12/30/2019   PRURITUS 11/25/2007   Qualifier: Diagnosis of  By: Etter Sjogren DOKendrick Fries     Rash and nonspecific skin eruption 09/16/2016   Seasonal allergies 05/01/2018   SPONDYLOSIS, CERVICAL, WITH  RADICULOPATHY 08/11/2007   Qualifier: Diagnosis of  By: [name], SHOULDER/ARM NEC 06/26/2007   Qualifier: Diagnosis of  By: [name], [place]    Stroke ([place]) 04/24/2011   Syncope 07/26/2017   Type 2 diabetes, diet controlled ([place])    VENTRICULAR HYPERTROPHY, LEFT 08/21/2007   Qualifier: Diagnosis of  By: [name]     Visual hallucinations 03/20/2018   Wart 01/18/2014   Wheezing 12/22/2013     Past Surgical History:  Procedure Laterality Date   CATARACT EXTRACTION W/ INTRAOCULAR LENS  IMPLANT, BILATERAL  2017   CIRCUMCISION N/A 10/02/2018   Procedure: CIRCUMCISION ADULT;  Surgeon: [name], MD;  Location: [facility]S;  Service: Urology;  Laterality: N/A;   HERNIA REPAIR     LAPAROSCOPIC CHOLECYSTECTOMY  early 2000s     Initial Focused Assessment (If applicable, or please see trauma documentation): Patient alert, oriented x2 Short of breath with difficulty speaking, per patient this has worsened over the last few days Home O2 3L L foot red, warm, open ulcers  CT's Completed:   CT Head and CT C-Spine   Interventions:  CXR/PXR/extremity XRs CT Head/Cspine  Plan for disposition:  Discharge home   Event Summary: Patient to ED after a fall at [facility]. Per EMS patient takes eliquis, patient has abrasion to right eye. Pain to left ankle and left hip. Patient has ulcers to left foot/ankle and redness. Patient feels he is more short of breath than his baseline, has been unable to speak more than a few words at a time which has worsened over the last few days. Daughter [name] was called per patient request. MD aware of all assessment findings. Patient imaging negative for acute traumatic injury, plans to discharge patient.  Bedside handoff with ED RN Luiz Iron.    Douglas Grant  Trauma Response RN  Please call TRN at [phone] for further assistance.

## 2023-03-17 NOTE — ED Notes (Signed)
Updated daughter, [name] on patient being discharged at this time.

## 2023-03-17 NOTE — ED Provider Notes (Signed)
[facility] Provider Note   CSN: [id number] Arrival date & time: 03/17/23  1200     History  Chief Complaint  Patient presents with   Douglas Grant is a 89 y.o. male.  89 yo M home.  With a chief complaint of a fall. This was reported by the nursing home in which he lives.  He had tried to get up out of the wheelchair and fell onto the ground.  Struck the right side of his head.  Patient is on blood thinners says he has a history of atrial fibrillation.  He is in memory care unit.  Patient does remember the fall.  Tells me that nothing bothers him currently.   Fall       Home Medications Prior to Admission medications   Medication Sig Start Date End Date Taking? Authorizing Provider  albuterol (VENTOLIN HFA) 108 (90 Base) MCG/ACT inhaler Inhale 2 puffs into the lungs every 6 (six) hours as needed for wheezing or shortness of breath. 09/09/20   Shelda Pal, DO  alfuzosin (UROXATRAL) 10 MG 24 hr tablet Take 10 mg by mouth daily with breakfast. Patient not taking: Reported on 08/28/2022    [provider]  amLODipine (NORVASC) 10 MG tablet Take 10 mg by mouth daily.    [provider]  amLODipine (NORVASC) 5 MG tablet Take 1 tablet (5 mg total) by mouth daily. Patient not taking: Reported on 08/28/2022 02/03/21   Shelda Pal, DO  benztropine (COGENTIN) 0.5 MG tablet Take 0.5 mg by mouth daily. Patient not taking: Reported on 08/28/2022 05/03/21   [provider]  cetirizine (ZYRTEC) 10 MG tablet Take 1 tablet (10 mg total) by mouth daily. Patient not taking: Reported on 08/28/2022 05/01/18   Shelda Pal, DO  Cyanocobalamin (B-12) 1000 MCG TABS Take 1,000 mcg by mouth daily.    [provider]  donepezil (ARICEPT) 10 MG tablet Take 10 mg by mouth at bedtime. 12/19/17   [provider]  Ferrous Sulfate Dried 45 MG TBCR Take 45 mg by mouth 2 (two) times daily.     [provider]  finasteride (PROSCAR) 5 MG tablet Take 5 mg by mouth daily in the afternoon. 03/15/09   [provider]  furosemide (LASIX) 20 MG tablet Take 20 mg by mouth daily.    [provider]  insulin glargine (LANTUS SOLOSTAR) 100 UNIT/ML Solostar Pen Inject 15 Units into the skin at bedtime.    [provider]  ipratropium-albuterol (DUONEB) 0.5-2.5 (3) MG/3ML SOLN Take 3 mLs by nebulization every 8 (eight) hours as needed (wheezing, shortness of breath).    [provider]  levothyroxine (SYNTHROID) 75 MCG tablet Take 75 mcg by mouth daily before breakfast.    [provider]  Melatonin 3 MG CAPS Take 3 mg by mouth at bedtime.    [provider]  memantine (NAMENDA) 5 MG tablet TAKE 1 TABLET TWICE A DAY 08/07/21   Shelda Pal, DO  metFORMIN (GLUCOPHAGE) 500 MG tablet Take 500 mg by mouth daily.    [provider]  omeprazole (PRILOSEC) 40 MG capsule Take 40 mg by mouth daily.    [provider]  pravastatin (PRAVACHOL) 40 MG tablet TAKE 1 TABLET DAILY 04/23/22   Wendling, Crosby Oyster, DO  predniSONE (DELTASONE) 20 MG tablet Take 20 mg by mouth daily with breakfast.    [provider]  propranolol ER (INDERAL  LA) 60 MG 24 hr capsule TAKE 1 CAPSULE DAILY 11/03/21   Shelda Pal, DO  SEROQUEL 200 MG tablet Take 200 mg by mouth daily. 12/12/21   [provider]  umeclidinium-vilanterol (ANORO ELLIPTA) 62.5-25 MCG/ACT AEPB Inhale 1 puff into the lungs daily.    [provider]  venlafaxine XR (EFFEXOR-XR) 150 MG 24 hr capsule Take 150 mg by mouth daily with breakfast.    [provider]      Allergies    Atenolol, Clonidine hydrochloride, Prednisone, and Verapamil    Review of Systems   Review of Systems  Physical Exam Updated Vital Signs BP (!) 158/64   Pulse (!) 52   Temp 97.9 F (36.6 C) (Oral)   Resp 15   Ht 5\' 8"  (1.727 m)   Wt 86 kg    SpO2 100%   BMI 28.83 kg/m  Physical Exam Vitals and nursing note reviewed.  Constitutional:      Appearance: He is well-developed.  HENT:     Head: Normocephalic.     Comments: Abrasion to the right frontal region no obvious midline C-spine tenderness.  Able to rotate his head 45 degrees in the direction without obvious pain. Eyes:     Pupils: Pupils are equal, round, and reactive to light.  Neck:     Vascular: No JVD.  Cardiovascular:     Rate and Rhythm: Normal rate and regular rhythm.     Heart sounds: No murmur heard.    No friction rub. No gallop.  Pulmonary:     Effort: No respiratory distress.     Breath sounds: No wheezing.  Abdominal:     General: There is no distension.     Tenderness: There is no abdominal tenderness. There is no guarding or rebound.  Musculoskeletal:        General: Normal range of motion.     Cervical back: Normal range of motion and neck supple.     Comments: Patient holds his left leg with significant internal rotation.  No obvious pain with internal/external rotation.  Palpated from head to toe without any other noted areas of bony tenderness.  Skin:    Coloration: Skin is not pale.     Findings: No rash.  Neurological:     Mental Status: He is alert and oriented to person, place, and time.  Psychiatric:        Behavior: Behavior normal.     ED Results / Procedures / Treatments   Labs (all labs ordered are listed, but only abnormal results are displayed) Labs Reviewed - No data to display  EKG EKG Interpretation  Date/Time:  Sunday March 17 2023 12:25:43 EDT Ventricular Rate:  61 PR Interval:  140 QRS Duration: 104 QT Interval:  485 QTC Calculation: 477 R Axis:   11 Text Interpretation: Sinus rhythm Atrial premature complex Baseline wander TECHNICALLY DIFFICULT Otherwise no significant change Confirmed by Deno Etienne [phone]) on 03/17/2023 12:34:22 PM  Radiology CT Head Wo Contrast  Result Date: 03/17/2023 CLINICAL DATA:  Head  trauma. EXAM: CT HEAD WITHOUT CONTRAST TECHNIQUE: Contiguous axial images were obtained from the base of the skull through the vertex without intravenous contrast. RADIATION DOSE REDUCTION: This exam was performed according to the departmental dose-optimization program which includes automated exposure control, adjustment of the mA and/or kV according to patient size and/or use of iterative reconstruction technique. COMPARISON:  03/08/2023 FINDINGS: Brain: There is periventricular white matter decreased attenuation consistent with small vessel ischemic changes. Ventricles, sulci  and cisterns are prominent consistent with age related involutional changes. No acute intracranial hemorrhage, mass effect or shift. No hydrocephalus. Vascular: No hyperdense vessel or unexpected calcification. Skull: Normal. Negative for fracture or focal lesion. Sinuses/Orbits: No acute finding. IMPRESSION: Atrophy and chronic small vessel ischemic changes. No acute intracranial process identified. Electronically Signed   By: Sammie Bench M.D.   On: 03/17/2023 12:56   DG Femur Portable Min 2 Views Left  Result Date: 03/17/2023 CLINICAL DATA:  Fall EXAM: LEFT FEMUR PORTABLE 2 VIEWS COMPARISON:  06/02/2021 FINDINGS: Postsurgical changes from left total hip arthroplasty. Arthroplasty components are in their expected alignment. No periprosthetic lucency or fracture. Alignment at the knee is maintained. No knee joint effusion. No appreciable soft tissue swelling. Advanced atherosclerotic vascular calcifications. IMPRESSION: Intact left femur. Left total hip arthroplasty without evidence of hardware complication. Electronically Signed   By: Davina Poke D.O.   On: 03/17/2023 12:55   CT Cervical Spine Wo Contrast  Result Date: 03/17/2023 CLINICAL DATA:  Trauma EXAM: CT CERVICAL SPINE WITHOUT CONTRAST TECHNIQUE: Multidetector CT imaging of the cervical spine was performed without intravenous contrast. Multiplanar CT image  reconstructions were also generated. RADIATION DOSE REDUCTION: This exam was performed according to the departmental dose-optimization program which includes automated exposure control, adjustment of the mA and/or kV according to patient size and/or use of iterative reconstruction technique. COMPARISON:  None Available. FINDINGS: Alignment: Normal. Skull base and vertebrae: No acute fracture. No primary bone lesion or focal pathologic process. Soft tissues and spinal canal: No prevertebral fluid or swelling. No visible canal hematoma. Disc levels: Disc space narrowing with marginal osteophyte formation C5-6 and C6-7. Osteoarthritis at C1-C2. Upper chest: Negative. Other: None. IMPRESSION: Degenerative changes.  No acute traumatic abnormalities. Electronically Signed   By: Sammie Bench M.D.   On: 03/17/2023 12:53   DG Pelvis Portable  Result Date: 03/17/2023 CLINICAL DATA:  Fall EXAM: PORTABLE PELVIS 1-2 VIEWS COMPARISON:  06/02/2021 FINDINGS: Bony pelvis intact without evidence of fracture or diastasis. Interval left total hip arthroplasty with arthroplasty components in their expected alignment. Degenerative lower lumbar spondylosis. Bones appear demineralized. Atherosclerotic vascular calcifications. IMPRESSION: Negative. Electronically Signed   By: Davina Poke D.O.   On: 03/17/2023 12:53   DG Chest Portable 1 View  Result Date: 03/17/2023 CLINICAL DATA:  Trauma EXAM: PORTABLE CHEST 1 VIEW COMPARISON:  03/08/2023 FINDINGS: Cardiac silhouette appears prominent. No pneumonia or pulmonary edema. No pneumothorax or pleural effusion. Aorta is calcified. No gross osseous abnormalities identified. IMPRESSION: Enlarged cardiac silhouette.  No focal consolidation. Electronically Signed   By: Sammie Bench M.D.   On: 03/17/2023 12:48    Procedures Procedures    Medications Ordered in ED Medications - No data to display  ED Course/ Medical Decision Making/ A&P                              Medical Decision Making Amount and/or Complexity of Data Reviewed Radiology: ordered.   89 yo M with a chief complaint of a fall.  This was reported by his nursing facility.  All of the history was obtained by EMS.  The patient denies any injury.  Does not remember falling.  On record review the patient was seen approximately 2 weeks ago at an outside hospital, had a very similar event.  At that time he was noted to have a history of Alzheimer's and is nonambulatory at baseline.  They did not do meant that  he is on Eliquis, however his PCP notes to indicate that he was started on Eliquis after having his left hip repaired he had developed a DVT.  Will obtain a plain film of the pelvis.  CT of the head and C-spine.  At his baseline per his nursing facility.  The patient reported he was having some trouble breathing at some point.  His position was changed with improvement.  Chest x-ray independently interpreted by me without obvious fracture, no pneumothorax.  CT of the head and C-spine without obvious intracranial pathology, no obvious C-spine fracture.  Will discharge the patient back to his facility.  1:02 PM:  I have discussed the diagnosis/risks/treatment options with the patient.  Evaluation and diagnostic testing in the emergency department does not suggest an emergent condition requiring admission or immediate intervention beyond what has been performed at this time.  They will follow up with PCP. We also discussed returning to the ED immediately if new or worsening sx occur. We discussed the sx which are most concerning (e.g., sudden worsening pain, fever, inability to tolerate by mouth, confusion, vomiting, headache) that necessitate immediate return. Medications administered to the patient during their visit and any new prescriptions provided to the patient are listed below.  Medications given during this visit Medications - No data to display   The patient appears reasonably screen  and/or stabilized for discharge and I doubt any other medical condition or other [facility] requiring further screening, evaluation, or treatment in the ED at this time prior to discharge.           Final Clinical Impression(s) / ED Diagnoses Final diagnoses:  Fall, initial encounter    Rx / DC Orders ED Discharge Orders     None         Deno Etienne, DO 03/17/23 1302

## 2023-03-17 NOTE — Discharge Instructions (Signed)
Follow up with your family doc in the office. Return for change in mental status, vomiting

## 2023-03-17 NOTE — Progress Notes (Signed)
   03/17/23 1236  Spiritual Encounters  Type of Visit Initial  Care provided to: Patient  Referral source Trauma page  Reason for visit Trauma  OnCall Visit Yes  Interventions  Spiritual Care Interventions Made Established relationship of care and support;Compassionate presence;Reflective listening  Intervention Outcomes  Outcomes Reduced anxiety;Reduced fear   Elon Jester responded to level 2 trauma page fall on thinners. Patient was minimally responsive. He appeared disoriented and a little afraid. Chaplain Margarethe Virgen provided compassionate presence. Chaplain let staff know to alert her if needed for further care or if family requested.   Note prepared by Abbott Pao, [place] Resident [phone]

## 2023-03-17 NOTE — ED Notes (Signed)
Called [facility] ETA 30

## 2023-03-17 NOTE — ED Triage Notes (Addendum)
Patient BIB [facility] from [facility] after falling from chair, landing on R side and back. Patient w/ complaints of neck pain- placed in c-collar by EMS removed by [name] on arrival. Patient w/ knot to R forehead and skin tears bilateral arms. Patient on 3 L of O2 at baseline.  Patient on Eliquis. Fall unwitnessed by staff.  VS: 170/80 BP       CBG- 266   HR-56   Resp- 22  Hx of dementia Aox3.  L leg noted to be interally rotated on initial presentation.

## 2023-04-24 DEATH — deceased
